# Patient Record
Sex: Male | Born: 1935
Health system: Southern US, Community
[De-identification: ages and names within clinical notes are randomized; demographics above are authoritative.]

## PROBLEM LIST (undated history)

## (undated) DIAGNOSIS — I2510 Atherosclerotic heart disease of native coronary artery without angina pectoris: Secondary | ICD-10-CM

## (undated) DIAGNOSIS — Z973 Presence of spectacles and contact lenses: Secondary | ICD-10-CM

## (undated) DIAGNOSIS — Z9289 Personal history of other medical treatment: Secondary | ICD-10-CM

## (undated) DIAGNOSIS — N401 Enlarged prostate with lower urinary tract symptoms: Secondary | ICD-10-CM

## (undated) DIAGNOSIS — I5042 Chronic combined systolic (congestive) and diastolic (congestive) heart failure: Secondary | ICD-10-CM

## (undated) DIAGNOSIS — Z974 Presence of external hearing-aid: Secondary | ICD-10-CM

## (undated) DIAGNOSIS — R4189 Other symptoms and signs involving cognitive functions and awareness: Secondary | ICD-10-CM

## (undated) DIAGNOSIS — R011 Cardiac murmur, unspecified: Secondary | ICD-10-CM

## (undated) DIAGNOSIS — Z955 Presence of coronary angioplasty implant and graft: Secondary | ICD-10-CM

## (undated) DIAGNOSIS — I440 Atrioventricular block, first degree: Secondary | ICD-10-CM

## (undated) DIAGNOSIS — R600 Localized edema: Secondary | ICD-10-CM

## (undated) DIAGNOSIS — E119 Type 2 diabetes mellitus without complications: Secondary | ICD-10-CM

## (undated) DIAGNOSIS — I10 Essential (primary) hypertension: Secondary | ICD-10-CM

## (undated) DIAGNOSIS — I209 Angina pectoris, unspecified: Secondary | ICD-10-CM

## (undated) DIAGNOSIS — R29898 Other symptoms and signs involving the musculoskeletal system: Secondary | ICD-10-CM

## (undated) DIAGNOSIS — I482 Chronic atrial fibrillation, unspecified: Secondary | ICD-10-CM

## (undated) DIAGNOSIS — G8929 Other chronic pain: Secondary | ICD-10-CM

## (undated) DIAGNOSIS — K219 Gastro-esophageal reflux disease without esophagitis: Secondary | ICD-10-CM

## (undated) DIAGNOSIS — R0609 Other forms of dyspnea: Secondary | ICD-10-CM

## (undated) DIAGNOSIS — Z8619 Personal history of other infectious and parasitic diseases: Secondary | ICD-10-CM

## (undated) DIAGNOSIS — M1990 Unspecified osteoarthritis, unspecified site: Secondary | ICD-10-CM

## (undated) DIAGNOSIS — I700 Atherosclerosis of aorta: Secondary | ICD-10-CM

## (undated) DIAGNOSIS — I447 Left bundle-branch block, unspecified: Secondary | ICD-10-CM

## (undated) DIAGNOSIS — D563 Thalassemia minor: Secondary | ICD-10-CM

## (undated) DIAGNOSIS — I259 Chronic ischemic heart disease, unspecified: Secondary | ICD-10-CM

## (undated) DIAGNOSIS — H9190 Unspecified hearing loss, unspecified ear: Secondary | ICD-10-CM

## (undated) DIAGNOSIS — Z951 Presence of aortocoronary bypass graft: Secondary | ICD-10-CM

## (undated) DIAGNOSIS — IMO0001 Reserved for inherently not codable concepts without codable children: Secondary | ICD-10-CM

## (undated) DIAGNOSIS — J449 Chronic obstructive pulmonary disease, unspecified: Secondary | ICD-10-CM

## (undated) HISTORY — PX: FOOT SURGERY: SHX648

## (undated) HISTORY — PX: CATARACT EXTRACTION W/ INTRAOCULAR LENS  IMPLANT, BILATERAL: SHX1307

## (undated) HISTORY — PX: CARDIAC CATHETERIZATION: SHX172

## (undated) HISTORY — DX: Chronic ischemic heart disease, unspecified: I25.9

## (undated) HISTORY — DX: Personal history of other medical treatment: Z92.89

## (undated) HISTORY — PX: CORONARY ARTERY BYPASS GRAFT: SHX141

## (undated) HISTORY — PX: APPENDECTOMY: SHX54

---

## 1976-10-26 HISTORY — PX: INGUINAL HERNIA REPAIR: SUR1180

## 1977-10-26 HISTORY — PX: HERNIA REPAIR: SHX51

## 1990-10-26 HISTORY — PX: CARDIAC CATHETERIZATION: SHX172

## 1999-06-05 ENCOUNTER — Emergency Department (HOSPITAL_COMMUNITY): Admission: EM | Admit: 1999-06-05 | Discharge: 1999-06-05 | Payer: Self-pay | Admitting: Emergency Medicine

## 2003-10-27 DIAGNOSIS — I259 Chronic ischemic heart disease, unspecified: Secondary | ICD-10-CM

## 2003-10-27 HISTORY — DX: Chronic ischemic heart disease, unspecified: I25.9

## 2003-12-25 HISTORY — PX: CORONARY ARTERY BYPASS GRAFT: SHX141

## 2004-01-03 ENCOUNTER — Inpatient Hospital Stay (HOSPITAL_COMMUNITY): Admission: EM | Admit: 2004-01-03 | Discharge: 2004-01-15 | Payer: Self-pay | Admitting: Emergency Medicine

## 2004-02-08 ENCOUNTER — Encounter
Admission: RE | Admit: 2004-02-08 | Discharge: 2004-02-08 | Payer: Self-pay | Admitting: Thoracic Surgery (Cardiothoracic Vascular Surgery)

## 2004-03-10 ENCOUNTER — Encounter (HOSPITAL_COMMUNITY): Admission: RE | Admit: 2004-03-10 | Discharge: 2004-06-08 | Payer: Self-pay | Admitting: Internal Medicine

## 2004-06-09 ENCOUNTER — Encounter (HOSPITAL_COMMUNITY): Admission: RE | Admit: 2004-06-09 | Discharge: 2004-09-07 | Payer: Self-pay | Admitting: Cardiology

## 2008-04-27 ENCOUNTER — Emergency Department (HOSPITAL_COMMUNITY): Admission: EM | Admit: 2008-04-27 | Discharge: 2008-04-27 | Payer: Self-pay | Admitting: Emergency Medicine

## 2008-04-30 ENCOUNTER — Encounter (HOSPITAL_COMMUNITY): Admission: RE | Admit: 2008-04-30 | Discharge: 2008-07-18 | Payer: Self-pay | Admitting: Emergency Medicine

## 2009-04-13 ENCOUNTER — Emergency Department (HOSPITAL_COMMUNITY): Admission: EM | Admit: 2009-04-13 | Discharge: 2009-04-13 | Payer: Self-pay | Admitting: Family Medicine

## 2009-11-05 ENCOUNTER — Emergency Department (HOSPITAL_COMMUNITY): Admission: EM | Admit: 2009-11-05 | Discharge: 2009-11-05 | Payer: Self-pay | Admitting: Emergency Medicine

## 2010-07-07 ENCOUNTER — Ambulatory Visit: Payer: Self-pay | Admitting: Cardiology

## 2010-07-15 ENCOUNTER — Ambulatory Visit (HOSPITAL_COMMUNITY): Admission: RE | Admit: 2010-07-15 | Discharge: 2010-07-15 | Payer: Self-pay | Admitting: Cardiology

## 2010-07-15 ENCOUNTER — Ambulatory Visit: Payer: Self-pay | Admitting: Cardiology

## 2010-07-15 HISTORY — PX: DOPPLER ECHOCARDIOGRAPHY: SHX263

## 2010-11-28 ENCOUNTER — Ambulatory Visit
Admission: RE | Admit: 2010-11-28 | Discharge: 2010-11-28 | Disposition: A | Discharge status: Home / Self Care | Payer: Medicare Other | Source: Ambulatory Visit | Attending: Cardiology | Admitting: Cardiology

## 2010-11-28 ENCOUNTER — Other Ambulatory Visit: Payer: Self-pay | Admitting: Cardiology

## 2010-11-28 ENCOUNTER — Ambulatory Visit (INDEPENDENT_AMBULATORY_CARE_PROVIDER_SITE_OTHER): Payer: Medicare Other | Admitting: Cardiology

## 2010-11-28 DIAGNOSIS — R0602 Shortness of breath: Secondary | ICD-10-CM

## 2010-11-28 DIAGNOSIS — I4460 Unspecified fascicular block: Secondary | ICD-10-CM

## 2011-03-06 ENCOUNTER — Telehealth: Payer: Self-pay | Admitting: Cardiology

## 2011-03-06 NOTE — Telephone Encounter (Signed)
Pt called  He wants Dr Patty Sermons to write note so [place] can go [place] please call

## 2011-03-06 NOTE — Telephone Encounter (Signed)
Wanted Dakota [name] to call him. Alternate cell phone number: [phone]

## 2011-03-06 NOTE — Telephone Encounter (Signed)
Wants a letter from you stating he is under your care and ok to travel outside of the country, but should not travel alone.  Would like it to be noted that his daughter Jamorion Gomillion should accompany him.  Will call him when completed.

## 2011-03-09 ENCOUNTER — Encounter: Payer: Self-pay | Admitting: *Deleted

## 2011-03-09 ENCOUNTER — Encounter: Payer: Self-pay | Admitting: Cardiology

## 2011-03-09 ENCOUNTER — Telehealth: Payer: Self-pay | Admitting: Cardiology

## 2011-03-09 NOTE — Telephone Encounter (Signed)
Don't change the note. He wanted it as written. Please call back.

## 2011-03-09 NOTE — Telephone Encounter (Signed)
To Whom It May Concern:  [name] is under my care for cardiac issues.   Based on his present level of health it would be okay for him to travel outside of the country but he should not travel alone.  His daughter Dakota [name] should accompany him.  Cong Hightower A. Tylie Golonka M.D.

## 2011-03-13 NOTE — Discharge Summary (Signed)
Dakota [name], Dakota [name] NO.:  [id number]   MEDICAL RECORD NO.:  [id number]                   PATIENT TYPE:  INP   LOCATION:  2015                                 FACILITY:  [facility]   PHYSICIAN:  Salvatore Decent. Dorris Fetch, M.D.         DATE OF BIRTH:  23-Mar-1926   DATE OF ADMISSION:  01/03/2004  DATE OF DISCHARGE:  01/15/2004                                 DISCHARGE SUMMARY   ADMISSION DIAGNOSIS:  Unstable angina.   ADDITIONAL/DISCHARGE DIAGNOSES:  1. Severe three-vessel coronary artery disease with unstable angina status     post coronary artery bypass grafting x4.  2. Diabetes mellitus type 2.  3. History of thalassemia minor.  4. Status post appendectomy.  5. Status post bilateral hernia repair in 1978.  6. Status post lens implantation bilaterally.   HOSPITAL MANAGEMENT/PROCEDURES:  1. Cardiac catheterization completed on January 04, 2004 by Dr. Deloris Ping.     Nahser.  2. Preoperative cardiac surgery screening including bilateral carotid     duplexes, palmar arch or Allen's test, and ABIs completed on January 07, 2004.  3. CT scan of the left hip without contrast completed on January 07, 2004.     This showed no evidence of fracture.  4. Coronary artery bypass grafting x4, completed on January 07, 2004 by Dr.     Dorris Fetch.  5. Blood transfusion of 1 unit of packed red blood cells January 11, 2004.   CONSULTATIONS:  1. Cardiac rehabilitation.  2. Nutrition.  3. Diabetes management.  4. Case management.   HISTORY OF PRESENT ILLNESS:  Dakota [name] is a 77 year old [place]-born  gentleman who presented to [facility] emergency department with  substernal chest pain. The patient had the onset of chest pain the evening  prior to presentation and again in the morning of presentation. The patient  was at work when he experienced this substernal chest pain and took  sublingual nitroglycerin which caused him a syncopal episode. The patient  stated  that he had been weak for the past week or so because of a flu-like  illness for which he was seen at an urgent care for treatment. The patient  was associated with diaphoresis and questionable nausea but no vomiting.  Pain occurred at rest and was described as a heavy weight sitting on his  chest. The patient was evaluated for chest pain in 1989 while living in  [place] and had cardiac catheterization there which apparently was  normal. A Cardiolite stress test was completed in January of 2001 which was  normal, and another Cardiolite stress test was completed in July of 2003  which was also normal. It was decided by Dr. Patty Sermons to admit the patient  to [facility] hospital for further evaluation of chest pain and to rule out  myocardial infarction.   HOSPITAL COURSE:  Dakota [name] was then admitted to [facility]  hospital for rule  out myocardial infarction on January 03, 2004. The patient was started on IV  heparin and treated appropriately for his chest pain. The patient remained  free of chest pain throughout the evening, and his cardiac enzymes were  negative for myocardial infarction. The plan was for the patient to undergo  cardiac catheterization on January 04, 2004 by Dr. Elease Hashimoto.   The patient was taken to the cardiac catheterization lab on January 04, 2004  and underwent left heart catheterization with coronary angiography. This  showed severe three-vessel coronary artery disease with left main  involvement. There was normal left ventricular systolic function with an  estimated ejection fraction of 65%. The patient was found to have diffuse  disease in the left anterior descending artery. There was a 70 to 80%  stenosis at the mid point of the first diagonal vessel. There was an ostial  90% stenosis right at the left circumflex origin and a 95 to 99% stenosis of  the branch point of two large obtuse marginal branches. The right coronary  artery was large and dominant, and there was  a 60 to 70% stenosis in the  distal right coronary artery, just prior to the bifurcation, into the PDA  and the PL segment. Dr. Patty Sermons felt it was appropriate that the patient  should be referred to CVTS for evaluation for coronary artery bypass  grafting. Dr. Dorris Fetch of CVTS responded to the consultation  appropriately.   Dr. Dorris Fetch saw and examined the patient in consultation January 04, 2004.  His impression was that this indeed was a 77 year old male with unstable  angina and three-vessel coronary artery disease with good left ventricular  function. He felt that coronary artery bypass grafting surgery was indicated  for survival benefit and relief of symptoms. The risks, benefits, and  alternatives were discussed with the patient and his family at that time.  They were in understanding of these risks and agreed to proceed with  surgery. The plan was for the patient to be taken to the operating room then  on first available opening which was January 08, 2004.   Throughout the next several hospital days, the patient had a few episodes of  recurrent chest pain. These were treated appropriately with IV  nitroglycerin, Integrilin, and heparin. The patient's EKG remained stable.  In addition to chest pain, the patient also complained of left hip pain of  which was causing him difficulties walking. Tylenol and mobility were  encouraged although the left hip pain persisted. It was decided that the  patient should undergo a left hip CT scan for evaluation of any further  process. The patient underwent a left CT scan of the hip on January 07, 2004  which showed no evidence of fracture. There was  sclerotic foci in the left  femoral head and neck region which could represent a bony island. The  patient's pain continued to be treated with Tylenol.   Dakota [name] also underwent preliminary arterial evaluation for cardiac surgery  which included bilateral carotid artery Duplex studies. This  showed no evidence of internal carotid artery stenosis bilaterally. Palmar arch  Doppler wave form in the right and left upper extremity were completed which  showed a diminished wave form with radial compression on the right and a  normal left palmar arch test. Bilateral ABIs were completed which showed  greater than 1.0 on the right and left. Continued as planned with surgery  done on January 08, 2004.  The patient was taken to the operating room on January 08, 2004 and underwent  coronary artery bypass grafting x4 utilizing the left internal mammary  artery to the left anterior descending artery, saphenous vein graft to the  first diagonal branch, saphenous vein graft to the obtuse marginal branch,  saphenous vein graft to the posterior descending artery. The greater  saphenous vein graft was harvested endoscopically from the right leg. The  patient had fair quality targets in the posterior descending, OM, and first  diagonal. The LAD was a good quality target. The saphenous vein was good  quality, and the patient had a good internal mammary artery. The patient  tolerated this procedure well and was transferred to the surgical intensive  care unit, intubated and sedated in critical but stable condition. The  patient remained hemodynamically stable throughout the evening of surgery.  His chest tube output was minimal, and he had good urine output. He was  extubated without difficulty later than evening.   On postoperative day #1, the patient remained afebrile and hemodynamically  stable. He was saturating at 98% on 1 liter nasal cannula. The patient's  invasive lines were discontinued in a stepwise fashion. He had minimal chest  tube output and no evidence of air leak. Therefore, his chest tubes were  also discontinued in routine fashion. He was found to have somewhat low  hemoglobin and hematocrit at 8.2 and 25.0 respectively. Decision was made to  monitor this for any further changes.  The patient was deemed appropriate for  transverse to the stepdown unit on that day.   The patient continued to do well while on the stepdown unit. He was  initiated with cardiac rehab and was ambulating in the hallways without  difficulty. He did complain of weakness and easy fatigability. His blood  clots were followed and slightly decreased once again to 7.3 and 23.3 for  hemoglobin and hematocrit. It was decided that the patient should be  transfused 1 unit of packed red blood cells for symptomatic relief. He  tolerated this well without any reaction. Otherwise, the patient continued  to increase his activity as tolerated. He was started on his preoperative  medications. He remained in a normal sinus rhythm on telemetry. He was  weaned from nasal cannula oxygen without any difficulties. He was tolerating  a regular diet. He had normal bowel and bladder function. His incisions were  healing well without evidence of infection. The sternum was stable. He continued to improve in energy and activity level over the next several  hospital days.   He was deemed appropriate for initiation of discharge planning then on  postoperative day #6 or January 14, 2004. The patient's physical exam at the  time of discharge planning was as follows:  He was afebrile with a blood  pressure of 120/70, pulse of 68 and regular, respirations of 20 and  unlabored, SpO2 of 94% on room air. His weight was 136 pounds which is at  preoperative weight. His bedside blood glucose measurements for the last 24  hours were as follows:  140, 170, 269, and 107. His heart was an irregular  rate and rhythm, reading normal sinus rhythm on telemetry. Lungs revealed  slightly diminished sounds on the left base, otherwise were clear. His  abdomen was soft, nontender, nondistended with good sounds. He had no  peripheral edema, and his extremities were warm and dry. His sternum was  stable, and his incision was clean, dry, and  intact. His right  lower  extremity had evidence of ecchymosis which was resolving, and his incisions  were clean, dry, and intact. The patient's pacer wires were discontinued  without difficulty that day.   We will continue as planned with discharge on January 15, 2004 pending a.m.  rounds and no change in the patient's clinical status. We will draw one more  CBC to evaluate for any change in the patient's hemoglobin and hematocrit.   DISCHARGE MEDICATIONS:  1. Toprol-XL 50 mg daily.  2. Zocor 20 mg daily.  3. Aspirin 325 mg daily.  4. Amaryl 2 mg daily.  5. Ultram 50 mg one to two tablets every four to six hours as needed for     pain.   ALLERGIES:  SULFA causes a rash.   DISCHARGE INSTRUCTIONS:  1. Activity:  No driving. No heavy lifting or strenuous activity. The     patient should continue to walk daily. He should continue his breathing     exercises.  2. Diet:  The patient should follow a constant carbohydrate, low-     cholesterol, heart healthy diet.  3. Wound care:  The patient may shower. He should wash his incisions daily     with soap and water. He should notify the CVTS office if he has any     increased redness, drainage, or swelling from his incisions.   FOLLOWUP APPOINTMENT:  1. The patient is to see Dr. Dorris Fetch within three weeks of discharge.     The CVTS office will call the patient with the appointment date and time.     He will also obtain a chest x-ray on that day on [facility] and hand carry the films to Dr. Sunday Corn office.  2. The patient is to follow up with Dr. Patty Sermons of [facility]     within two weeks of discharge. He is to call [phone] for this     appointment.      Carolyn A. Eustaquio Boyden.                  Salvatore Decent Dorris Fetch, M.D.    CAF/MEDQ  D:  01/14/2004  T:  01/16/2004  Job:  [id number]   cc:   Lenn Cal, M.D.  [employer]

## 2011-03-13 NOTE — Op Note (Signed)
Dakota [name], Dakota NO.:  [id number]   MEDICAL RECORD NO.:  [id number]                   PATIENT TYPE:  INP   LOCATION:  2301                                 FACILITY:  [facility]   PHYSICIAN:  Salvatore Decent. Dorris Fetch, M.D.         DATE OF BIRTH:  May 13, 1926   DATE OF PROCEDURE:  01/08/2004  DATE OF DISCHARGE:                                 OPERATIVE REPORT   PREOPERATIVE DIAGNOSIS:  Three-vessel coronary disease with unstable angina.   POSTOPERATIVE DIAGNOSIS:  Three-vessel coronary disease with unstable  angina.   PROCEDURES:  Median sternotomy, extracorporeal circulation, coronary artery  bypass grafting x4 (left internal mammary artery to left anterior descending  artery, saphenous vein graft to first diagonal, saphenous vein graft to  obtuse marginal, saphenous vein graft to posterior descending), endoscopic  vein harvest, right leg.   SURGEON:  Salvatore Decent. Dorris Fetch, M.D.   ASSESSMENT:  Carmin Muskrat. Eustaquio Boyden.   ANESTHESIA:  General.   FINDINGS:  Diffusely diseased, fair-quality targets in the posterior  descending, OM, and first diagonal.  LAD good-quality target.  Good-quality  saphenous vein.  Good mammary.   CLINICAL NOTE:  Dakota [name] is a 77 year old [name] gentleman who presented  with unstable angina.  He had two episodes of severe substernal chest  tightness.  He was admitted and ruled four myocardial infarction but at  catheterization had severe three-vessel coronary disease.  He was referred  for coronary artery bypass grafting.  The indications, risks, benefits, and  alternatives were discussed in detail with the patient.  He understood and  accepted the risks and agreed to proceed.   OPERATIVE NOTE:  Dakota [name] was brought to the preop holding area on January 08, 2004.  There lines were placed to monitor arterial, central venous, and  pulmonary arterial pressure.  Intravenous antibiotics were administered.  The patient was taken  to the operating room, anesthetized, and intubated.  A  Foley catheter was placed.  The chest, abdomen, and legs were prepped and  draped in the usual fashion.   An incision was made in the medial aspect of the right leg.  The greater  saphenous vein was identified at the knee.  It was then harvested  endoscopically from the right thigh as well as from the upper portion of the  right leg.  It became very small in the left after it bifurcated, but there  was adequate conduit for the grafts of the portion of the vein that was of  adequate size.  A median sternotomy was performed and the left internal  mammary artery was harvested in standard fashion.  It was a 2 mm good-  quality vessel.  The patient was fully heparinized prior to dividing the  distal end of the mammary artery.   The pericardium was opened and the ascending aorta was inspected and  palpated.  There was no palpable atherosclerotic disease.  The aorta was  cannulated via concentric 2-0 Ethibond pledgeted pursestring sutures.  A  dual-stage venous cannula was placed via pursestring suture in the right  atrial appendage.  Cardiopulmonary bypass was instituted and the patient was  cooled to 32 degrees Celsius.  The coronary arteries were inspected and  anastomotic sites were chosen.  The conduits were inspected and cut to  length.  A foam pad was placed in the pericardium to protect the left  phrenic nerve, a temperature probe was placed in the myocardial septum, and  a cardioplegia cannula was placed in the ascending aorta.   The aorta was crossclamped.  The left ventricle was emptied via the aortic  root vent.  Cardiac arrest then was achieved with a combination of cold  antegrade blood cardioplegia and topical iced saline.  One liter of  cardioplegia was administered.  The myocardial septal temperature was 11  degrees Celsius.  The following distal anastomoses then were performed.   First a reversed saphenous vein graft  was placed end-to-side to the  posterior descending.  An arteriotomy was made in the posterior descending,  then extended retrograde onto the distal right coronary.  There was some  plaque in the distal right coronary, but a probe passed easily into the  continuation of the coronary beyond the bifurcation as well as a 1.5 mm  probe passing into the posterior descending itself.  The vein graft then was  anastomosed end-to-side with a running 7-0 Prolene suture.  The target was  only of fair quality.  The vein was of good quality.  The anastomosis was  probed proximally and distally, as all anastomoses were prior to tying the  suture.  There was excellent flow through the anasetomosis.  Cardioplegia  was administered.  There was a small leak at the heel, which was repaired  with a 7-0 Prolene suture.   Next a reversed saphenous vein graft was placed end-to-side to the large  obtuse marginal branch of the left circumflex coronary artery.  The  circumflex had a 50% irregular stenosis.  The OM was a large vessel, which  was of fair quality.  There was diffuse disease in it, but a 1.5 mm probe  did pass easily proximally and distally.  A saphenous vein was anastomosed  end-to-side with a running 7-0 Prolene suture.  Again the vein was of good  quality.  At the completion of the anastomosis the probe passed easily.  There was excellent flow.  Cardioplegia was administered.  There was good  hemostasis.   Next a reversed saphenous vein graft was placed to the first diagonal branch  of the LAD.  The first diagonal arose from the LAD.  There was a severely  diseased portion with heavy calcification.  It then bifurcated.  The branch  closer to the LAD was the larger of the two and was the only one large  enough to graft.  It was of fair quality, but a 1.5 mm probe did fit into the vessel.  The saphenous vein was anastomosed end-to-side with a running 7-  0 Prolene suture.  Again there was good flow  through this vein.  Cardioplegia was administered.  There was good hemostasis.  Of note, this  vein was smaller proximally than the other two vein segments.   Next the left internal mammary artery was brought through a window in the  pericardium and the distal end was spatulated.  It was a 2 mm good-quality  conduit.  It  was anastomosed end-to-side to the LAD, which was a 2 mm good-  quality target.  The anastomosis was performed with a running 8-0 Prolene  suture in an end-to-side fashion.  At the completion of the mammary to LAD  anastomosis, the bulldog clamp was briefly removed.  There was a good flash  of blood into the distal vessel.  There was immediate and rapid septal  rewarming.  The bulldog clamp was replaced.  The mammary pedicle was tacked  to the epicardial surface of the heart with 6-0 Prolene sutures.   Additional cardioplegia then was administered down the vein grafts and  aortic root.  The vein grafts were cut to length, the cardioplegia cannula  was removed from the ascending aorta, and the proximal vein graft  anastomoses then were performed to 4.0 mm punch aortotomies with running 6-0  Prolene sutures.  At the completion of the final proximal anastomosis,  lidocaine was administered, the bulldog clamp was again removed range of  motion the left mammary artery, and immediate and rapid septal rewarming was  once again noted.  The patient was placed in steep Trendelenburg position  and the lungs were inflated, the heart was filled with blood, and the aortic  root was de-aired.  After de-airing the aortic root, the aortic crossclamp  was removed with a total crossclamp time of 75 minutes.  The patient  required a single defibrillation with 20 joules.  He then was profoundly  bradycardic.  The patient was rewarmed during the proximal anastomoses.   All proximal and distal anastomoses were inspected for hemostasis,  epicardial pacing wires were placed on the right  ventricle and right atrium.  DDD pacing was initiated.  The patient did pace atrially but had a very long  PR interval; therefore, it was elected to dual-chamber pace.  After the  patient had been rewarmed to a core temperature of 37 degrees Celsius, he  was weaned from cardiopulmonary bypass without difficulty.  He was paced but  on no inotropic support at the time of separation from bypass.  The total  bypass time was 139 minutes.  The cardiac index was low initially but  responded appropriately to volume administration.   A test dose of protamine was administered and was well-tolerated.  The  atrial and aortic cannulae were removed.  The remainder of the protamine was  administered without incident.  The chest was irrigated with 1 L of warm  normal saline containing 1 g of vancomycin.  Hemostasis was achieved.  The pericardium was reapproximated with interrupted 3-0 silk sutures.  It came  together easily without tension.  A left pleural and two mediastinal chest  tubes were placed through separate subcostal incisions.  The sternum was  closed with heavy-gauge interrupted stainless steel wires.  The remainder of  the incisions were closed in standard fashion.  Subcuticular closures were  used for the skin.  All sponge, needle, and instrument counts were correct  at the end of the procedure.  The patient was taken from the operating room  to the surgical intensive care unit in critical but stable condition.                                               Salvatore Decent Dorris Fetch, M.D.    SCH/MEDQ  D:  01/08/2004  T:  01/10/2004  Job:  [id number]   cc:   Cassell Clement, M.D.  [address]  [place]  [place] [address]  Fax: [phone]   [name], M.D.  [address]  [place]  [place] [address]  Fax: [phone]

## 2011-03-13 NOTE — H&P (Signed)
Dakota [name], Dakota [name] NO.:  [id number]   MEDICAL RECORD NO.:  [id number]                   PATIENT TYPE:  EMS   LOCATION:  MAJO                                 FACILITY:  [facility]   PHYSICIAN:  Cassell Clement, M.D.              DATE OF BIRTH:  05/05/1926   DATE OF ADMISSION:  01/03/2004  DATE OF DISCHARGE:                                HISTORY & PHYSICAL   CHIEF COMPLAINT:  Chest pain.   HISTORY:  This is a 77 year old Pakistani-born gentleman admitted with  substernal chest pain.  He had the onset of pain last evening and again this  morning.  This morning he was at work and took his nitroglycerin while  standing up and promptly had syncope and woke up lying on the floor.  He was  working at [employer] at the time.  He states he has been weak for the past  week because of a flu-like illness for which he was seen at one of the  urgent cares and was placed on Tamiflu.  The pain this morning was  associated with diaphoresis and questionable nausea but no vomiting.  There  was no radiation into the arms.  The pain occurred at rest and was described  as a heavy weight sitting on his chest.  The patient has a past suspected  cardiac history.  He was evaluated for chest pain in 1989 while living in  [place] and had cardiac catheterization there, which was normal.  We  did a Cardiolite stress test in January 2001 which was normal and a  Cardiolite stress test in July 2003, which was normal.  Ejection fraction at  that time was 67%.   The patient is on Amaryl 1 mg a day, Ecotrin 81 mg a day, and has Prevacid  on hand for p.r.n. use.   FAMILY HISTORY:  His father died at age 75 of old age.  Brother died of a  stroke at age 25.   SOCIAL HISTORY:  He is married and has two children.  He is a nonsmoker.  He  works at [employer] in [employer], and he works in Airline pilot.   REVIEW OF SYSTEMS:  He denies any history of GI bleed or peptic ulcer.   He  has had occasional constipation.  He denies any dysuria.  He has had a  remote history of a kidney stone.  He is diabetic, on Amaryl and careful  diet.  He states his cholesterol in the past has been normal.  Remainder of  the review of systems is negative in detail.   PHYSICAL EXAMINATION:  VITAL SIGNS:  Blood pressure is 124/60, pulse 70 and  regular, respirations are normal.  HEENT:  Appears slightly pale.  Pupils are equal and reactive.  NECK:  Jugular venous pressure normal.  Carotids normal.  Thyroid normal.  CHEST:  Scattered rhonchi consistent  with his history of recent flu-like  illness.  CARDIAC:  Quiet precordium with a questionable S4 gallop, no S3, no murmur,  click, or rub.  ABDOMEN:  No hepatosplenomegaly or mass.  EXTREMITIES:  Good peripheral pulses, no phlebitis or edema.   His electrocardiogram shows normal sinus rhythm with slight ST segment  elevation in leads II, III, aVF, and V2 through V6.  There is first degree  AV block with a PR of 0.22.  His initial two sets of cardiac enzymes are  negative.   IMPRESSION:  1. Chest pain, rule out myocardial infarction.  2. Diabetes mellitus.  3. Recent flu-like illness with chest congestion.  4. History of thalassemia minor.   DISPOSITION:  We are going to treat with IV heparin.  Will add beta blocker.  Will give him IV fluids and check lipids.  Anticipate cardiac  catheterization Friday morning by Dr. Elease Hashimoto or colleagues to evaluate  question of coronary disease further.                                                Cassell Clement, M.D.    TB/MEDQ  D:  01/03/2004  T:  01/03/2004  Job:  [id number]   cc:   [name], M.D.  [address]  [place]  [place] [address]  Fax: [phone]   Vesta Mixer, M.D.  [address], [address]  [place]  [place] [address]  Fax: [phone]

## 2011-03-13 NOTE — Cardiovascular Report (Signed)
NAMEJARAN, SAINZ NO.:  [id number]   MEDICAL RECORD NO.:  [id number]                   PATIENT TYPE:  INP   LOCATION:  3731                                 FACILITY:  [facility]   PHYSICIAN:  Vesta Mixer, M.D.              DATE OF BIRTH:  Nov 18, 1925   DATE OF PROCEDURE:  DATE OF DISCHARGE:                              CARDIAC CATHETERIZATION   Dakota [name] is a 77 year old gentleman with a history of chest pain in the  past.  He has had a negative Cardiolite study several years ago.  He had a  negative heart catheterization in [place] approximately 15 years ago.   The patient has had some intermittent episodes of chest pain and then  presented yesterday with symptoms consistent with unstable angina.   PROCEDURE:  Left heart catheterization with coronary angiography.   HEMODYNAMICS:  The left ventricular pressure was 136/7 with an aortic  pressure of 138/61.   CORONARY ANGIOGRAPHY:  1. Left main coronary artery has a distal 50-60% stenosis.  2. The left anterior descending artery is very heavily calcified in the     proximal and mid segments.  The proximal segment is diffusely diseased     between 60 and 70% and there is a 70-75% stenosis in the mid vessel.  The     remainder of the LAD has some minor luminal irregularities and a distal     stenosis of approximately 40%.  The first diagonal vessel is a large     vessel.  There is a 70-80% stenosis at its mid point.  3. The left circumflex artery is a large vessel.  There is an ostial 90%     stenosis right at the left circumflex origin.  The first obtuse marginal     artery is a very large and branching vessel.  There is a 95-99% stenosis     right at the branch point of these  two large obtuse marginal branches.  4. The right coronary artery is large and dominant.  There is mild to     moderate disease in the proximal and mid segments.  There is a 60-70%     stenosis in the distal RCA just  prior to the bifurcation into the     posterior descending artery in the posterior lateral segment artery.  The     posterior descending artery and the posterior lateral segment artery have     mild-to-moderate irregularities.   LEFT VENTRICULOGRAM:  Left ventriculogram reveals normal left ventricular  systolic function.  The ejection fraction is 65%.   COMPLICATIONS:  None.   CONCLUSIONS:  1. Severe three-vessel coronary artery disease with left main involvement.  2. Normal left ventricular systolic function.   We will refer the patient to [facility] for evaluation for coronary artery bypass  grafting.  Vesta Mixer, M.D.    PJN/MEDQ  D:  01/04/2004  T:  01/05/2004  Job:  [id number]   cc:   [facility]

## 2011-03-24 ENCOUNTER — Ambulatory Visit: Payer: Medicare Other | Admitting: Cardiology

## 2011-04-02 ENCOUNTER — Encounter: Payer: Self-pay | Admitting: Cardiology

## 2011-04-03 ENCOUNTER — Encounter: Payer: Self-pay | Admitting: Cardiology

## 2011-04-03 ENCOUNTER — Ambulatory Visit (INDEPENDENT_AMBULATORY_CARE_PROVIDER_SITE_OTHER): Payer: BC Managed Care – PPO | Admitting: Cardiology

## 2011-04-03 VITALS — BP 100/66 | HR 56 | Ht 67.0 in | Wt 155.4 lb

## 2011-04-03 DIAGNOSIS — I5042 Chronic combined systolic (congestive) and diastolic (congestive) heart failure: Secondary | ICD-10-CM

## 2011-04-03 DIAGNOSIS — Z951 Presence of aortocoronary bypass graft: Secondary | ICD-10-CM | POA: Insufficient documentation

## 2011-04-03 DIAGNOSIS — Z9889 Other specified postprocedural states: Secondary | ICD-10-CM

## 2011-04-03 DIAGNOSIS — I5022 Chronic systolic (congestive) heart failure: Secondary | ICD-10-CM | POA: Insufficient documentation

## 2011-04-03 DIAGNOSIS — E7800 Pure hypercholesterolemia, unspecified: Secondary | ICD-10-CM | POA: Insufficient documentation

## 2011-04-03 DIAGNOSIS — I447 Left bundle-branch block, unspecified: Secondary | ICD-10-CM | POA: Insufficient documentation

## 2011-04-03 DIAGNOSIS — E119 Type 2 diabetes mellitus without complications: Secondary | ICD-10-CM

## 2011-04-03 NOTE — Assessment & Plan Note (Signed)
This elderly gentleman has a history of coronary artery bypass graft surgery in March 2005.  He also has a history of mild aortic stenosis by echocardiogram in 2009.  His last nuclear stress test was 01/07/10 and showed no reversible ischemia.  He does have known left bundle-branch block and he has dyssynergy of the septum secondary to his bundle-branch block.  He has had dyspnea which has responded to Lasix.  Since we last saw him his wife died after a long illness.  Following her death his daughter took him on a trip to [place] to visit relatives and this was a help to him he tolerated the trip well.

## 2011-04-03 NOTE — Progress Notes (Signed)
Dakota [name] Date of Birth:  [birth year] [facility] [address]   [address] [place], [place]  [address] [phone]           Fax   [phone]  History of Present Illness: This pleasant 77 year old Dakota [name] born gentleman is seen for a scheduled followup office visit.  He has known ischemic heart disease.  He underwent coronary artery bypass graft surgery in March of 2005.  He has a history of mild aortic stenosis and trivial aortic insufficiency as well as mitral annular calcification and trace mitral regurgitation by echocardiogram of 08/27/08.  He had a nuclear stress test on 01/07/10 showing an ejection fraction of 57% and no reversible ischemia.  His previous echocardiogram 07/15/10 showed an ejection fraction of 40-45%.  He has felt to have chronic mixed combined systolic and diastolic congestive heart failure.  Since we last saw him his wife died of a chronic cardiopulmonary illness.  The patient seems to be making an appropriate recovery from the grief of his wife's illness.  Current Outpatient Prescriptions  Medication Sig Dispense Refill  . acetaminophen (TYLENOL) 325 MG tablet Take 650 mg by mouth every 6 (six) hours as needed.        [name] aspirin 325 MG EC tablet Take 325 mg by mouth daily.        [name] doxazosin (CARDURA) 4 MG tablet Take 4 mg by mouth at bedtime.        . furosemide (LASIX) 40 MG tablet Take 40 mg by mouth daily. 4 times a week       . glimepiride (AMARYL) 2 MG tablet Take 2 mg by mouth daily before breakfast.        . Multiple Vitamin (MULTIVITAMIN) tablet Take 1 tablet by mouth daily.        . nitroGLYCERIN (NITROSTAT) 0.4 MG SL tablet Place 0.4 mg under the tongue every 5 (five) minutes as needed.        . pravastatin (PRAVACHOL) 40 MG tablet Take 40 mg by mouth daily.        . valsartan (DIOVAN) 80 MG tablet Take 80 mg by mouth daily.        [name] DISCONTD: isosorbide mononitrate (IMDUR) 30 MG 24 hr tablet Take 30 mg by mouth daily.            Allergies  Allergen Reactions  . Aceon (Perindopril Erbumine) Cough  . Sulfa Antibiotics   . Nitroglycerin     Patches, [name], HA    Patient Active Problem List  Diagnoses  . Hx of CABG  . Left bundle branch block  . Diabetes mellitus  . Hypercholesterolemia  . Chronic combined systolic and diastolic congestive heart failure    History  Smoking status  . Never Smoker   Smokeless tobacco  . Not on file    History  Alcohol Use No    Family History  Problem Relation Age of Onset  . Ulcers Mother   . Stroke Mother     Review of Systems: Constitutional: no fever chills diaphoresis or fatigue or change in weight.  Head and neck: no hearing loss, no epistaxis, no photophobia or visual disturbance. Respiratory: No cough, shortness of breath or wheezing. Cardiovascular: No chest pain peripheral edema, palpitations. Gastrointestinal: No abdominal distention, no abdominal pain, no change in bowel habits hematochezia or melena. Genitourinary: No dysuria, no frequency, no urgency, no nocturia. Musculoskeletal:No arthralgias, no back pain, no gait disturbance or myalgias. Neurological: No  dizziness, no headaches, no numbness, no seizures, no syncope, no weakness, no tremors. Hematologic: No lymphadenopathy, no easy bruising. Psychiatric: No confusion, no hallucinations, no sleep disturbance.    Physical Exam: Filed Vitals:   04/03/11 1604  BP: 100/66  Pulse: 56  The general appearance reveals an elderly gentleman in no distress.Pupils equal and reactive.   Extraocular Movements are full.  There is no scleral icterus.  The mouth and pharynx are normal.  The neck is supple.  The carotids reveal no bruits.  The jugular venous pressure is normal.  The thyroid is not enlarged.  There is no lymphadenopathy.  The chest reveals mild expiratory wheeze.  The heart reveals a grade 2/6 systolic ejection murmur at the base.  No diastolic murmur.The abdomen is soft and nontender.  Bowel sounds are normal. The liver and spleen are not enlarged. There Are no abdominal masses. There are no bruits.  Normal extremity without phlebitis or edema.  Pedal pulses are excellent.Strength is normal and symmetrical in all extremities.  There is no lateralizing weakness.  There are no sensory deficits.The skin is warm and dry.  There is no rash.  EKG today shows normal sinus rhythm with left bundle-branch block and is unchanged from 11/28/10   Assessment / Plan: Continue present medication and use extra furosemide as necessary for dyspnea or wheezing.  Recheck in 4 months for office visit andLab work.

## 2011-04-03 NOTE — Assessment & Plan Note (Signed)
The patient has a history of chronic dyspnea.  His blood pressure tends to run low so we are not able to push diuretics to heart.  He previously was on Imdur which he stopped because it caused him to be dizzy.

## 2011-04-08 ENCOUNTER — Other Ambulatory Visit: Payer: Self-pay | Admitting: Cardiology

## 2011-04-08 ENCOUNTER — Encounter: Payer: Self-pay | Admitting: Cardiology

## 2011-04-08 NOTE — Telephone Encounter (Signed)
Faxed refill for NTG 0.4 to [facility]

## 2011-09-05 ENCOUNTER — Other Ambulatory Visit: Payer: Self-pay | Admitting: Cardiology

## 2011-09-22 ENCOUNTER — Ambulatory Visit (INDEPENDENT_AMBULATORY_CARE_PROVIDER_SITE_OTHER): Payer: BC Managed Care – PPO | Admitting: Cardiology

## 2011-09-22 ENCOUNTER — Encounter: Payer: Self-pay | Admitting: Cardiology

## 2011-09-22 VITALS — BP 132/68 | HR 70 | Ht 66.0 in | Wt 151.0 lb

## 2011-09-22 DIAGNOSIS — Z9889 Other specified postprocedural states: Secondary | ICD-10-CM

## 2011-09-22 DIAGNOSIS — Z951 Presence of aortocoronary bypass graft: Secondary | ICD-10-CM

## 2011-09-22 DIAGNOSIS — I119 Hypertensive heart disease without heart failure: Secondary | ICD-10-CM

## 2011-09-22 DIAGNOSIS — I5042 Chronic combined systolic (congestive) and diastolic (congestive) heart failure: Secondary | ICD-10-CM

## 2011-09-22 DIAGNOSIS — I447 Left bundle-branch block, unspecified: Secondary | ICD-10-CM

## 2011-09-22 NOTE — Assessment & Plan Note (Signed)
Patient is not having any symptoms referable to his left bundle branch block.  No dizziness or syncope.

## 2011-09-22 NOTE — Assessment & Plan Note (Addendum)
The patient is not having any paroxysmal nocturnal dyspnea or pedal edema at this time.  His exertional dyspnea if anything has improved in his weight is down 5 pounds since his last visit

## 2011-09-22 NOTE — Assessment & Plan Note (Signed)
The patient has not had to take any recent nitroglycerin.  No recent chest pain or angina.

## 2011-09-22 NOTE — Patient Instructions (Signed)
Your physician recommends that you continue on your current medications as directed. Please refer to the Current Medication list given to you today. Your physician recommends that you schedule a follow-up appointment in: 3 months with labs (EKG/bmet)

## 2011-09-22 NOTE — Progress Notes (Signed)
Dakota [name] Date of Birth:  06/04/1926 [facility] [address]   [address] [place], [place]  [address] [phone]           Fax   [phone]  History of Present Illness: This pleasant 77 year old gentleman is seen back after a long absence.  He has a history of chronic combined systolic and diastolic congestive heart failure per also has left bundle branch block and a past history of coronary artery bypass graft surgery.  Since we last saw him he took a three-month trip to [place] and [place] and [place] to see his relatives.  His daughter went with him.  He tolerated the trip very well and it was therapeutic as he is dealing with the grief of his wife's death.  Current Outpatient Prescriptions  Medication Sig Dispense Refill  . acetaminophen (TYLENOL) 325 MG tablet Take 650 mg by mouth every 6 (six) hours as needed.        [name] aspirin 325 MG EC tablet Take 325 mg by mouth daily.        [name] Sodium 30-100 MG CAPS Take 100 mg by mouth daily.        [name] DIOVAN 160 MG tablet TAKE ONE TABLET BY MOUTH EVERY DAY.  90 each  3  . doxazosin (CARDURA) 4 MG tablet Take 1 mg by mouth at bedtime.       . finasteride (PROSCAR) 5 MG tablet Take 5 mg by mouth daily.        . furosemide (LASIX) 40 MG tablet Take 40 mg by mouth daily. 2-3 times a week      . glimepiride (AMARYL) 2 MG tablet Take 2 mg by mouth daily before breakfast.        . glucose 4 GM chewable tablet Chew 16 g by mouth as needed.        . Multiple Vitamin (MULTIVITAMIN) tablet Take 1 tablet by mouth daily.        . nitroGLYCERIN (NITROLINGUAL) 0.4 MG/SPRAY spray Place 1 spray under the tongue every 5 (five) minutes as needed.        [name] NITROSTAT 0.4 MG SL tablet DISSOLVE ONE TABLET UNDER TONGUE AS NEEDED  100 each  11  . pravastatin (PRAVACHOL) 80 MG tablet Take 80 mg by mouth daily.          Allergies  Allergen Reactions  . Aceon (Perindopril Erbumine) Cough  . Sulfa Antibiotics     . Nitroglycerin     Patches, [name], HA    Patient Active Problem List  Diagnoses  . Hx of CABG  . Left bundle branch block  . Diabetes mellitus  . Hypercholesterolemia  . Chronic combined systolic and diastolic congestive heart failure    History  Smoking status  . Never Smoker   Smokeless tobacco  . Not on file    History  Alcohol Use No    Family History  Problem Relation Age of Onset  . Ulcers Mother   . Stroke Mother     Review of Systems: Constitutional: no fever chills diaphoresis or fatigue or change in weight.  Head and neck: no hearing loss, no epistaxis, no photophobia or visual disturbance. Respiratory: No cough, shortness of breath or wheezing. Cardiovascular: No chest pain peripheral edema, palpitations. Gastrointestinal: No abdominal distention, no abdominal pain, no change in bowel habits hematochezia or melena. Genitourinary: No dysuria, no frequency, no urgency, no nocturia. Musculoskeletal:No arthralgias, no back pain, no  gait disturbance or myalgias. Neurological: No dizziness, no headaches, no numbness, no seizures, no syncope, no weakness, no tremors. Hematologic: No lymphadenopathy, no easy bruising. Psychiatric: No confusion, no hallucinations, no sleep disturbance.    Physical Exam: Filed Vitals:   09/22/11 1526  BP: 132/68  Pulse: 70   the general appearance reveals an elderly gentleman in no acute distress.  His overall mood seems much improved since his previous visit.Pupils equal and reactive.   Extraocular Movements are full.  There is no scleral icterus.  The mouth and pharynx are normal.  The neck is supple.  The carotids reveal no bruits.  The jugular venous pressure is normal.  The thyroid is not enlarged.  There is no lymphadenopathy.  The chest is clear to percussion and auscultation. There are no rales or rhonchi. Expansion of the chest is symmetrical.  The precordium is quiet.  The first heart sound is normal.  The second  heart sound is physiologically split.  There is no murmur gallop rub or click.  There is no abnormal lift or heave.  The abdomen is soft and nontender. Bowel sounds are normal. The liver and spleen are not enlarged. There Are no abdominal masses. There are no bruits.  Extremities no phlebitis or edema.  Pedal pulses present.The skin is warm and dry.  There is no rash. Strength is normal and symmetrical in all extremities.  There is no lateralizing weakness.  There are no sensory deficits.     Assessment / Plan: The patient is doing well.  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  He is to continue same medication and be rechecked in 4 months for a followup office visit.  He had a recent complete physical with lab work at Dr. Deneen Harts office

## 2011-12-14 ENCOUNTER — Other Ambulatory Visit (INDEPENDENT_AMBULATORY_CARE_PROVIDER_SITE_OTHER): Payer: BC Managed Care – PPO | Admitting: *Deleted

## 2011-12-14 DIAGNOSIS — I119 Hypertensive heart disease without heart failure: Secondary | ICD-10-CM

## 2011-12-14 LAB — BASIC METABOLIC PANEL
CO2: 27 mEq/L (ref 19–32)
Calcium: 8.4 mg/dL (ref 8.4–10.5)
Potassium: 4 mEq/L (ref 3.5–5.1)
Sodium: 140 mEq/L (ref 135–145)

## 2011-12-14 NOTE — Progress Notes (Signed)
Quick Note:  Please make copy of labs for patient visit. ______ 

## 2011-12-16 ENCOUNTER — Encounter: Payer: Self-pay | Admitting: Cardiology

## 2011-12-16 ENCOUNTER — Ambulatory Visit (INDEPENDENT_AMBULATORY_CARE_PROVIDER_SITE_OTHER)
Admission: RE | Admit: 2011-12-16 | Discharge: 2011-12-16 | Disposition: A | Discharge status: Home / Self Care | Payer: BC Managed Care – PPO | Source: Ambulatory Visit | Attending: Cardiology | Admitting: Cardiology

## 2011-12-16 ENCOUNTER — Ambulatory Visit (INDEPENDENT_AMBULATORY_CARE_PROVIDER_SITE_OTHER): Payer: BC Managed Care – PPO | Admitting: Cardiology

## 2011-12-16 VITALS — BP 120/68 | HR 62 | Ht 67.0 in | Wt 150.0 lb

## 2011-12-16 DIAGNOSIS — I359 Nonrheumatic aortic valve disorder, unspecified: Secondary | ICD-10-CM

## 2011-12-16 DIAGNOSIS — I5042 Chronic combined systolic (congestive) and diastolic (congestive) heart failure: Secondary | ICD-10-CM

## 2011-12-16 DIAGNOSIS — E119 Type 2 diabetes mellitus without complications: Secondary | ICD-10-CM

## 2011-12-16 DIAGNOSIS — R0600 Dyspnea, unspecified: Secondary | ICD-10-CM

## 2011-12-16 DIAGNOSIS — R0609 Other forms of dyspnea: Secondary | ICD-10-CM

## 2011-12-16 DIAGNOSIS — Z951 Presence of aortocoronary bypass graft: Secondary | ICD-10-CM

## 2011-12-16 DIAGNOSIS — E7800 Pure hypercholesterolemia, unspecified: Secondary | ICD-10-CM

## 2011-12-16 NOTE — Assessment & Plan Note (Signed)
The patient sleeps on one pillow.  He has not been experiencing any paroxysmal nocturnal dyspnea.  Is not having any significant peripheral edema.

## 2011-12-16 NOTE — Patient Instructions (Signed)
Your physician has requested that you have an echocardiogram. Echocardiography is a painless test that uses sound waves to create images of your heart. It provides your doctor with information about the size and shape of your heart and how well your heart's chambers and valves are working. This procedure takes approximately one hour. There are no restrictions for this procedure.  Will have you go to the [facility] building across from [facility] for chest xray   Your physician recommends that you schedule a follow-up appointment in: 3 month ov/EKG  Your physician recommends that you continue on your current medications as directed. Please refer to the Current Medication list given to you today.

## 2011-12-16 NOTE — Assessment & Plan Note (Signed)
The patient is diabetic.  He has not been expressing any hypoglycemic episodes.

## 2011-12-16 NOTE — Assessment & Plan Note (Signed)
The patient has a past history of coronary artery bypass graft surgery in 2005.  He has not been experiencing any recent chest pain or angina.

## 2011-12-16 NOTE — Progress Notes (Signed)
Beverely Low Date of Birth:  [birth year] [facility] [address] [place], [place]  [address] [phone]         Fax   [phone]  History of Present Illness: This pleasant elderly gentleman is seen for a four-month followup office visit.  He has a history of ischemic heart disease and is status post coronary artery bypass graft surgery.  He has a history of chronic combined systolic and diastolic congestive heart failure.  He has a known left bundle branch block.  Since his last visit he has continued to complain of shortness of breath.  He is concerned that his heart situation may be getting worse.  He had an echocardiogram 2 years ago which showed an ejection fraction of 40-45%.  Current Outpatient Prescriptions  Medication Sig Dispense Refill  . acetaminophen (TYLENOL) 325 MG tablet Take 650 mg by mouth every 6 (six) hours as needed.        [name] aspirin 325 MG EC tablet Take 325 mg by mouth daily.        [name] Sodium 30-100 MG CAPS Take 100 mg by mouth daily.        [name] DIOVAN 160 MG tablet TAKE ONE TABLET BY MOUTH EVERY DAY.  90 each  3  . doxazosin (CARDURA) 4 MG tablet Take 1 mg by mouth at bedtime.       . finasteride (PROSCAR) 5 MG tablet Take 5 mg by mouth daily.        . furosemide (LASIX) 40 MG tablet Take 40 mg by mouth daily. 2-3 times a week      . glimepiride (AMARYL) 2 MG tablet Take 2 mg by mouth daily before breakfast.        . glucose 4 GM chewable tablet Chew 16 g by mouth as needed.        . Multiple Vitamin (MULTIVITAMIN) tablet Take 1 tablet by mouth daily.        . nitroGLYCERIN (NITROLINGUAL) 0.4 MG/SPRAY spray Place 1 spray under the tongue every 5 (five) minutes as needed.        [name] NITROSTAT 0.4 MG SL tablet DISSOLVE ONE TABLET UNDER TONGUE AS NEEDED  100 each  11  . pravastatin (PRAVACHOL) 80 MG tablet Take 80 mg by mouth daily.          Allergies  Allergen Reactions  . Aceon (Perindopril Erbumine) Cough  . Sulfa  Antibiotics   . Nitroglycerin     Patches, low BP, HA    Patient Active Problem List  Diagnoses  . Hx of CABG  . Left bundle branch block  . Diabetes mellitus  . Hypercholesterolemia  . Chronic combined systolic and diastolic congestive heart failure    History  Smoking status  . Never Smoker   Smokeless tobacco  . Not on file    History  Alcohol Use No    Family History  Problem Relation Age of Onset  . Ulcers Mother   . Stroke Mother     Review of Systems: Constitutional: no fever chills diaphoresis or fatigue or change in weight.  Head and neck: no hearing loss, no epistaxis, no photophobia or visual disturbance. Respiratory: No cough, shortness of breath or wheezing. Cardiovascular: No chest pain peripheral edema, palpitations. Gastrointestinal: No abdominal distention, no abdominal pain, no change in bowel habits hematochezia or melena. Genitourinary: No dysuria, no frequency, no urgency, no nocturia. Musculoskeletal:No arthralgias, no back pain, no gait disturbance or myalgias. Neurological: No  dizziness, no headaches, no numbness, no seizures, no syncope, no weakness, no tremors. Hematologic: No lymphadenopathy, no easy bruising. Psychiatric: No confusion, no hallucinations, no sleep disturbance.    Physical Exam: Filed Vitals:   12/16/11 1430  BP: 120/68  Pulse: 62   the general appearance reveals an elderly gentleman in no acute distress.Pupils equal and reactive.   Extraocular Movements are full.  There is no scleral icterus.  The mouth and pharynx are normal.  The neck is supple.  The carotids reveal no bruits.  The jugular venous pressure is normal.  The thyroid is not enlarged.  There is no lymphadenopathy.  The chest is clear to percussion and auscultation.  The heart reveals a soft systolic ejection murmur at the base and is second heart sound is accentuated The abdomen is soft and nontender. Bowel sounds are normal. The liver and spleen are not  enlarged. There Are no abdominal masses. There are no bruits.  Extremities reveal trace pretibial and ankle edema.Strength is normal and symmetrical in all extremities.  There is no lateralizing weakness.  There are no sensory deficits.  The skin is warm and dry.  There is no rash.    Assessment / Plan: Verlon Au going to get a chest x-ray.  We are also going to get a two-dimensional echocardiogram to make sure that his aortic stenosis has not worsened.  He will return in 3 months for a followup office visit and EKG

## 2011-12-18 NOTE — Progress Notes (Signed)
Left message

## 2011-12-21 ENCOUNTER — Telehealth: Payer: Self-pay | Admitting: *Deleted

## 2011-12-21 ENCOUNTER — Telehealth: Payer: Self-pay | Admitting: Cardiology

## 2011-12-21 MED ORDER — FUROSEMIDE 20 MG PO TABS: 20.0000 mg | ORAL_TABLET | Freq: Every day | ORAL | Status: DC

## 2011-12-21 MED ORDER — NITROGLYCERIN 0.4 MG/SPRAY TL SOLN: 1.0000 | Status: DC | PRN

## 2011-12-21 NOTE — Telephone Encounter (Signed)
FU Call: pt returning call from Melinda. Please return pt call to discuss further.  

## 2011-12-21 NOTE — Telephone Encounter (Signed)
Advised patient of xray results.  Also patient requested refill on Lasix 20 mg taking a few times a week.  Chart stated 40 mg of Lasix, patient said it was too strong and he had been taking 1/2 at a time

## 2011-12-21 NOTE — Telephone Encounter (Signed)
Advised of chest xray results 

## 2011-12-21 NOTE — Telephone Encounter (Signed)
Message copied by Burnell Blanks on Mon Dec 21, 2011 11:29 AM ------      Message from: Cassell Clement      Created: Wed Dec 16, 2011  9:03 PM       Pl report.  The xray is  Stable. No CHF or fluid.

## 2011-12-23 ENCOUNTER — Other Ambulatory Visit: Payer: Self-pay

## 2011-12-23 ENCOUNTER — Ambulatory Visit (HOSPITAL_COMMUNITY): Payer: BC Managed Care – PPO | Attending: Cardiology

## 2011-12-23 DIAGNOSIS — I379 Nonrheumatic pulmonary valve disorder, unspecified: Secondary | ICD-10-CM | POA: Insufficient documentation

## 2011-12-23 DIAGNOSIS — Z951 Presence of aortocoronary bypass graft: Secondary | ICD-10-CM

## 2011-12-23 DIAGNOSIS — E785 Hyperlipidemia, unspecified: Secondary | ICD-10-CM | POA: Insufficient documentation

## 2011-12-23 DIAGNOSIS — E119 Type 2 diabetes mellitus without complications: Secondary | ICD-10-CM | POA: Insufficient documentation

## 2011-12-23 DIAGNOSIS — I079 Rheumatic tricuspid valve disease, unspecified: Secondary | ICD-10-CM | POA: Insufficient documentation

## 2011-12-23 DIAGNOSIS — I359 Nonrheumatic aortic valve disorder, unspecified: Secondary | ICD-10-CM

## 2011-12-23 DIAGNOSIS — I2510 Atherosclerotic heart disease of native coronary artery without angina pectoris: Secondary | ICD-10-CM

## 2011-12-28 ENCOUNTER — Telehealth: Payer: Self-pay | Admitting: *Deleted

## 2011-12-28 ENCOUNTER — Telehealth: Payer: Self-pay | Admitting: Cardiology

## 2011-12-28 NOTE — Telephone Encounter (Signed)
Pt rtn call [phone] re lab work

## 2011-12-28 NOTE — Telephone Encounter (Signed)
Message copied by Burnell Blanks on Mon Dec 28, 2011 11:04 AM ------      Message from: Cassell Clement      Created: Wed Dec 23, 2011  5:13 PM       Please report.  The echocardiogram is satisfactory.  The ejection fraction is in the range of 50-55%.  There is diastolic dysfunction which could account for some of his dyspnea.  The valves are doing okay.  Continue same medication

## 2011-12-28 NOTE — Telephone Encounter (Signed)
Advised of echo results 

## 2012-03-05 ENCOUNTER — Other Ambulatory Visit: Payer: Self-pay | Admitting: Cardiology

## 2012-03-07 ENCOUNTER — Ambulatory Visit: Payer: BC Managed Care – PPO | Admitting: Cardiology

## 2012-03-07 NOTE — Telephone Encounter (Signed)
Refilled furosemide

## 2012-04-11 ENCOUNTER — Ambulatory Visit (INDEPENDENT_AMBULATORY_CARE_PROVIDER_SITE_OTHER): Payer: BC Managed Care – PPO | Admitting: Cardiology

## 2012-04-11 ENCOUNTER — Encounter: Payer: Self-pay | Admitting: Cardiology

## 2012-04-11 VITALS — BP 100/60 | HR 66 | Ht 67.0 in | Wt 141.0 lb

## 2012-04-11 DIAGNOSIS — R5383 Other fatigue: Secondary | ICD-10-CM

## 2012-04-11 DIAGNOSIS — Z951 Presence of aortocoronary bypass graft: Secondary | ICD-10-CM

## 2012-04-11 DIAGNOSIS — R5381 Other malaise: Secondary | ICD-10-CM | POA: Insufficient documentation

## 2012-04-11 DIAGNOSIS — I509 Heart failure, unspecified: Secondary | ICD-10-CM

## 2012-04-11 DIAGNOSIS — I447 Left bundle-branch block, unspecified: Secondary | ICD-10-CM

## 2012-04-11 DIAGNOSIS — E7800 Pure hypercholesterolemia, unspecified: Secondary | ICD-10-CM

## 2012-04-11 DIAGNOSIS — I5042 Chronic combined systolic (congestive) and diastolic (congestive) heart failure: Secondary | ICD-10-CM

## 2012-04-11 MED ORDER — LOSARTAN POTASSIUM 50 MG PO TABS: 50.0000 mg | ORAL_TABLET | Freq: Every day | ORAL | Status: DC

## 2012-04-11 NOTE — Assessment & Plan Note (Signed)
The patient has stopped his isosorbide mononitrate.  He did not think it was helping his chest tightness and it was making him feel weak and dizzy.  He has tried it several times and has concluded that the side effects were from the isosorbide mononitrate which he no longer takes

## 2012-04-11 NOTE — Progress Notes (Signed)
Dakota Dakota [name] Date of Birth:  August 09, 1926 [facility] [address] [place], [place]  [address] [phone]         Fax   [phone]  History of Present Illness: This pleasant elderly 78 year old gentleman is seen for followup office visit.  He has not been feeling too well.  He has had dizzy spells at times.  His weight is down 9 pounds since last visit.  He does not drink much in the way of fluids or water.  Blood pressure has been running lower.  He has occasional chest pain.  He has a past history of ischemic heart disease and is status post coronary artery bypass graft surgery.  He has combined chronic systolic and diastolic CHF and he has a known left bundle branch block.  His echocardiogram 2 years ago showed an ejection fraction of 40-45% and a more recent echo done on 12/23/11 showed that his ejection fraction had increased to 50-55%.  He does have diastolic dysfunction.  His coronary bypass graft surgery was in 2005. Current Outpatient Prescriptions  Medication Sig Dispense Refill  . Acetaminophen (TYLENOL PO) Take by mouth. Taking 650mg  as needed      . aspirin 325 MG EC tablet Take 325 mg by mouth daily.        Dakota Dakota [name] 30-100 MG CAPS Take 100 mg by mouth daily.        [name] doxazosin (CARDURA) 4 MG tablet Take 1 mg by mouth at bedtime.       . finasteride (PROSCAR) 5 MG tablet Take 5 mg by mouth daily.        . furosemide (LASIX) 20 MG tablet Take 1 tablet (20 mg total) by mouth daily. 2-3 times a week  30 tablet  11  . glimepiride (AMARYL) 2 MG tablet Take 2 mg by mouth daily before breakfast.        . glucose 4 GM chewable tablet Chew 16 g by mouth as needed.        . Multiple Vitamin (MULTIVITAMIN) tablet Take 1 tablet by mouth daily.        . nitroGLYCERIN (NITROLINGUAL) 0.4 MG/SPRAY spray Place 1 spray under the tongue every 5 (five) minutes as needed.  12 g  prn  . pantoprazole (PROTONIX) 40 MG tablet as directed.      . pravastatin  (PRAVACHOL) 80 MG tablet Take 80 mg by mouth daily.        [name] losartan (COZAAR) 50 MG tablet Take 1 tablet (50 mg total) by mouth daily.  30 tablet  6    Allergies  Allergen Reactions  . Aceon (Perindopril Erbumine) Cough  . Sulfa Antibiotics   . Nitroglycerin     Patches, Dakota [name], HA    Patient Active Problem List  Diagnosis  . Hx of CABG  . Left bundle branch block  . Diabetes mellitus  . Hypercholesterolemia  . Chronic combined systolic and diastolic congestive heart failure    History  Smoking status  . Never Smoker   Smokeless tobacco  . Not on file    History  Alcohol Use No    Family History  Problem Relation Age of Onset  . Ulcers Mother   . Stroke Mother     Review of Systems: Constitutional: no fever chills diaphoresis or fatigue or change in weight.  Head and neck: no hearing loss, no epistaxis, no photophobia or visual disturbance. Respiratory: No cough, shortness of breath or wheezing. Cardiovascular: No  chest pain peripheral edema, palpitations. Gastrointestinal: No abdominal distention, no abdominal pain, no change in bowel habits hematochezia or melena. Genitourinary: No dysuria, no frequency, no urgency, no nocturia. Musculoskeletal:No arthralgias, no back pain, no gait disturbance or myalgias. Neurological: No dizziness, no headaches, no numbness, no seizures, no syncope, no weakness, no tremors. Hematologic: No lymphadenopathy, no easy bruising. Psychiatric: No confusion, no hallucinations, no sleep disturbance.    Physical Exam: Filed Vitals:   04/11/12 1517  BP: 100/60  Pulse: 66   The general appearance reveals an elderly gentleman in no acute distress.The head and neck exam reveals pupils equal and reactive.  Extraocular movements are full.  There is no scleral icterus.  The mouth and pharynx are normal.  The neck is supple.  The carotids reveal no bruits.  The jugular venous pressure is normal.  The  thyroid is not enlarged.  There is no  lymphadenopathy.  The chest is clear to percussion and auscultation.  There are no rales or rhonchi.  Expansion of the chest is symmetrical.  The precordium is quiet.  The first heart sound is normal.  The second heart sound is physiologically split.  There is no murmur gallop rub or click.  There is no abnormal lift or heave.  The abdomen is soft and nontender.  The bowel sounds are normal.  The liver and spleen are not enlarged.  There are no abdominal masses.  There are no abdominal bruits.  Extremities reveal good pedal pulses.  There is no phlebitis or edema.  There is no cyanosis or clubbing.  Strength is normal and symmetrical in all extremities.  There is no lateralizing weakness.  There are no sensory deficits.  The skin is warm and dry.  There is no rash.    Assessment / Plan: Continue same medication but drank more water.  We are also putting him on a different blood pressure pill.  He will stop Diovan and switched to generic losartan 50 mg one daily.  This may allow for a slightly higher resting blood pressure.  Await results of today's blood work.  Recheck in 3 months for followup office visit

## 2012-04-11 NOTE — Assessment & Plan Note (Signed)
The patient has had exertional dyspnea.  He is not having any orthopnea or paroxysmal nocturnal dyspnea.  He states that he feels better when he is walking around at work as opposed to sitting around and doing nothing.  He works as a Holiday representative at [employer].

## 2012-04-11 NOTE — Assessment & Plan Note (Signed)
The patient is on pravastatin for his hypercholesterolemia and his ischemic heart disease status post CABG.

## 2012-04-11 NOTE — Assessment & Plan Note (Signed)
The patient suspects that he may be "dehydrated".  He may be correct because he drinks very little fluids during the day.  He does take his furosemide to her 3 times a week.  I have encouraged him to increase his water intake.  We are checking a basal metabolic panel today.

## 2012-04-11 NOTE — Patient Instructions (Addendum)
Stop taking Diovan.  Start Losartan 50mg  daily.  Drink more water.  Have BMET drawn today.  Your physician recommends that you schedule a follow-up appointment in: 3 months with Dr. Patty Sermons.

## 2012-04-12 LAB — BASIC METABOLIC PANEL
CO2: 28 mEq/L (ref 19–32)
Calcium: 8.9 mg/dL (ref 8.4–10.5)
GFR: 91.23 mL/min (ref >60.00)
Glucose, Bld: 90 mg/dL (ref 70–99)
Potassium: 4.4 mEq/L (ref 3.5–5.1)
Sodium: 140 mEq/L (ref 135–145)

## 2012-04-12 NOTE — Progress Notes (Signed)
Quick Note:  Please report to patient. The recent labs are stable. Continue same medication and careful diet. Continue to drink plenty of water ______ 

## 2012-04-18 NOTE — Addendum Note (Signed)
Addended by: Reine Just on: 04/18/2012 12:07 PM   Modules accepted: Orders

## 2012-06-28 ENCOUNTER — Ambulatory Visit (INDEPENDENT_AMBULATORY_CARE_PROVIDER_SITE_OTHER): Payer: BC Managed Care – PPO | Admitting: Cardiology

## 2012-06-28 ENCOUNTER — Encounter: Payer: Self-pay | Admitting: Cardiology

## 2012-06-28 VITALS — BP 126/64 | HR 62 | Ht 67.0 in | Wt 146.4 lb

## 2012-06-28 DIAGNOSIS — R5381 Other malaise: Secondary | ICD-10-CM

## 2012-06-28 DIAGNOSIS — I5042 Chronic combined systolic (congestive) and diastolic (congestive) heart failure: Secondary | ICD-10-CM

## 2012-06-28 DIAGNOSIS — R5383 Other fatigue: Secondary | ICD-10-CM

## 2012-06-28 DIAGNOSIS — I509 Heart failure, unspecified: Secondary | ICD-10-CM

## 2012-06-28 DIAGNOSIS — E7800 Pure hypercholesterolemia, unspecified: Secondary | ICD-10-CM

## 2012-06-28 DIAGNOSIS — Z951 Presence of aortocoronary bypass graft: Secondary | ICD-10-CM

## 2012-06-28 NOTE — Assessment & Plan Note (Signed)
Patient has a history of hypercholesterolemia.  He is on pravastatin.  Is not having any myalgias

## 2012-06-28 NOTE — Assessment & Plan Note (Signed)
His energy level has been little better since last visit.

## 2012-06-28 NOTE — Progress Notes (Signed)
Dakota [name] Date of Birth:  [birth year] [name] [address] [name], [place]  [address] [phone]         Fax   [phone]  History of Present Illness: This pleasant elderly 78 year old gentleman is seen for followup office visit.  He has occasional chest pain. He has a past history of ischemic heart disease and is status post coronary artery bypass graft surgery. He has combined chronic systolic and diastolic CHF and he has a known left bundle branch block. His echocardiogram 2 years ago showed an ejection fraction of 40-45% and a more recent echo done on 12/23/11 showed that his ejection fraction had increased to 50-55%. He does have diastolic dysfunction. His coronary bypass graft surgery was in 2005.  Since last visit he has been doing reasonably well.  He said no severe recent chest pain.  He remains short of breath with moderate exertion.  He has poor balance when he walks and uses a cane.  He has been eating better and his weight is up 5 pounds.   Current Outpatient Prescriptions  Medication Sig Dispense Refill  . Acetaminophen (TYLENOL PO) Take by mouth. Taking 650mg  as needed      . aspirin 325 MG EC tablet Take 325 mg by mouth daily.        [name] Sodium 30-100 MG CAPS Take 100 mg by mouth daily.        [name] doxazosin (CARDURA) 4 MG tablet Take 1 mg by mouth at bedtime.       . finasteride (PROSCAR) 5 MG tablet Take 5 mg by mouth daily.        . furosemide (LASIX) 20 MG tablet Take 1 tablet (20 mg total) by mouth daily. 2-3 times a week  30 tablet  11  . glimepiride (AMARYL) 2 MG tablet Take 2 mg by mouth daily before breakfast.        . glucose 4 GM chewable tablet Chew 16 g by mouth as needed.        [name] losartan (COZAAR) 50 MG tablet Take 1 tablet (50 mg total) by mouth daily.  30 tablet  6  . Multiple Vitamin (MULTIVITAMIN) tablet Take 1 tablet by mouth daily.        . nitroGLYCERIN (NITROLINGUAL) 0.4 MG/SPRAY spray Place 1 spray under  the tongue every 5 (five) minutes as needed.  12 g  prn  . pantoprazole (PROTONIX) 40 MG tablet as directed.      . pravastatin (PRAVACHOL) 80 MG tablet Take 80 mg by mouth daily.          Allergies  Allergen Reactions  . Aceon (Perindopril Erbumine) Cough  . Sulfa Antibiotics   . Nitroglycerin     Patches, [name], HA    Patient Active Problem List  Diagnosis  . Hx of CABG  . Left bundle branch block  . Diabetes mellitus  . Hypercholesterolemia  . Chronic combined systolic and diastolic congestive heart failure  . Malaise and fatigue    History  Smoking status  . Never Smoker   Smokeless tobacco  . Not on file    History  Alcohol Use No    Family History  Problem Relation Age of Onset  . Ulcers Mother   . Stroke Mother     Review of Systems: Constitutional: no fever chills diaphoresis or fatigue or change in weight.  Head and neck: no hearing loss, no epistaxis, no photophobia or visual disturbance. Respiratory: No  cough, shortness of breath or wheezing. Cardiovascular: No chest pain peripheral edema, palpitations. Gastrointestinal: No abdominal distention, no abdominal pain, no change in bowel habits hematochezia or melena. Genitourinary: No dysuria, no frequency, no urgency, no nocturia. Musculoskeletal:No arthralgias, no back pain, no gait disturbance or myalgias. Neurological: No dizziness, no headaches, no numbness, no seizures, no syncope, no weakness, no tremors. Hematologic: No lymphadenopathy, no easy bruising. Psychiatric: No confusion, no hallucinations, no sleep disturbance.    Physical Exam: Filed Vitals:   06/28/12 1524  BP: 126/64  Pulse: 62   general appearance reveals an elderly gentleman in no acute distress.The head and neck exam reveals pupils equal and reactive.  Extraocular movements are full.  There is no scleral icterus.  The mouth and pharynx are normal.  The neck is supple.  The carotids reveal no bruits.  The jugular venous  pressure is normal.  The  thyroid is not enlarged.  There is no lymphadenopathy.  The chest is clear to percussion and auscultation.  There are no rales or rhonchi.  Expansion of the chest is symmetrical.  The precordium is quiet.  The first heart sound is normal.  The second heart sound is physiologically split.  There is no murmur gallop rub or click.  There is no abnormal lift or heave.  The abdomen is soft and nontender.  The bowel sounds are normal.  The liver and spleen are not enlarged.  There are no abdominal masses.  There are no abdominal bruits.  Extremities reveal good pedal pulses.  There is no phlebitis or edema.  There is no cyanosis or clubbing.  Strength is normal and symmetrical in all extremities.  There is no lateralizing weakness.  There are no sensory deficits.  The skin is warm and dry.  There is no rash.     Assessment / Plan: Overall the patient appears to be stable.  Dr. [name] has checked some recent labs and has checked him for [name] vitamin D levels. The patient will continue same medication and be rechecked here in 3 months for followup office visit and EKG.

## 2012-06-28 NOTE — Assessment & Plan Note (Signed)
The patient has had no prolonged chest discomfort.

## 2012-06-28 NOTE — Assessment & Plan Note (Signed)
The patient has combined systolic and diastolic congestive heart failure.  He sleeps with his head slightly elevated.  When he sleeps on his back he snores according to his daughter but he does not have periods of audible sleep apnea.

## 2012-06-28 NOTE — Patient Instructions (Addendum)
Your physician recommends that you continue on your current medications as directed. Please refer to the Current Medication list given to you today.  Your physician recommends that you schedule a follow-up appointment in: 3 months  

## 2012-07-04 ENCOUNTER — Encounter: Payer: Self-pay | Admitting: Cardiology

## 2012-09-26 ENCOUNTER — Encounter: Payer: Self-pay | Admitting: Cardiology

## 2012-09-26 ENCOUNTER — Ambulatory Visit (INDEPENDENT_AMBULATORY_CARE_PROVIDER_SITE_OTHER): Payer: BC Managed Care – PPO | Admitting: Cardiology

## 2012-09-26 VITALS — BP 143/44 | HR 65 | Ht 67.0 in | Wt 143.6 lb

## 2012-09-26 DIAGNOSIS — R5381 Other malaise: Secondary | ICD-10-CM

## 2012-09-26 DIAGNOSIS — I5042 Chronic combined systolic (congestive) and diastolic (congestive) heart failure: Secondary | ICD-10-CM

## 2012-09-26 DIAGNOSIS — R05 Cough: Secondary | ICD-10-CM

## 2012-09-26 DIAGNOSIS — Z951 Presence of aortocoronary bypass graft: Secondary | ICD-10-CM

## 2012-09-26 DIAGNOSIS — R5383 Other fatigue: Secondary | ICD-10-CM

## 2012-09-26 DIAGNOSIS — R059 Cough, unspecified: Secondary | ICD-10-CM

## 2012-09-26 DIAGNOSIS — I509 Heart failure, unspecified: Secondary | ICD-10-CM

## 2012-09-26 NOTE — Progress Notes (Signed)
Beverely Low Date of Birth:  Nov 11, 1925 [facility] [address] [place], [place]  [address] [phone]         Fax   [phone]  History of Present Illness: This pleasant elderly 78 year old gentleman is seen for followup office visit. He has occasional chest pain. He has a past history of ischemic heart disease and is status post coronary artery bypass graft surgery. He has combined chronic systolic and diastolic CHF and he has a known left bundle branch block. His echocardiogram 2 years ago showed an ejection fraction of 40-45% and a more recent echo done on 12/23/11 showed that his ejection fraction had increased to 50-55%. He does have diastolic dysfunction. His coronary bypass graft surgery was in 2005. Since last visit he has been doing reasonably well. He said no severe recent chest pain. He remains short of breath with moderate exertion. He has poor balance when he walks and uses a cane.    Current Outpatient Prescriptions  Medication Sig Dispense Refill  . Acetaminophen (TYLENOL PO) Take by mouth. Taking 650mg  as needed      . aspirin 325 MG EC tablet Take 325 mg by mouth daily.        [name] azithromycin (ZITHROMAX) 250 MG tablet       . Casanthranol-Docusate Sodium 30-100 MG CAPS Take 100 mg by mouth daily.        [name] doxazosin (CARDURA) 4 MG tablet Take 1 mg by mouth at bedtime.       . furosemide (LASIX) 20 MG tablet Take 1 tablet (20 mg total) by mouth daily. 2-3 times a week  30 tablet  11  . glimepiride (AMARYL) 2 MG tablet Take 2 mg by mouth daily before breakfast.        . glucose 4 GM chewable tablet Chew 16 g by mouth as needed.        [name] losartan (COZAAR) 50 MG tablet Take 1 tablet (50 mg total) by mouth daily.  30 tablet  6  . Multiple Vitamin (MULTIVITAMIN) tablet Take 1 tablet by mouth daily.        . nitroGLYCERIN (NITROLINGUAL) 0.4 MG/SPRAY spray Place 1 spray under the tongue every 5 (five) minutes as needed.  12 g  prn  . pantoprazole (PROTONIX)  40 MG tablet as directed.      . pravastatin (PRAVACHOL) 80 MG tablet Take 80 mg by mouth daily.        [name] PROSCAR 5 MG tablet         Allergies  Allergen Reactions  . Aceon (Perindopril Erbumine) Cough  . Sulfa Antibiotics   . Nitroglycerin     Patches, low BP, HA    Patient Active Problem List  Diagnosis  . Hx of CABG  . Left bundle branch block  . Diabetes mellitus  . Hypercholesterolemia  . Chronic combined systolic and diastolic congestive heart failure  . Malaise and fatigue    History  Smoking status  . Never Smoker   Smokeless tobacco  . Not on file    History  Alcohol Use No    Family History  Problem Relation Age of Onset  . Ulcers Mother   . Stroke Mother     Review of Systems: Constitutional: no fever chills diaphoresis or fatigue or change in weight.  Head and neck: no hearing loss, no epistaxis, no photophobia or visual disturbance. Respiratory: No cough, shortness of breath or wheezing. Cardiovascular: No chest pain peripheral edema, palpitations.  Gastrointestinal: No abdominal distention, no abdominal pain, no change in bowel habits hematochezia or melena. Genitourinary: No dysuria, no frequency, no urgency, no nocturia. Musculoskeletal:No arthralgias, no back pain, no gait disturbance or myalgias. Neurological: No dizziness, no headaches, no numbness, no seizures, no syncope, no weakness, no tremors. Hematologic: No lymphadenopathy, no easy bruising. Psychiatric: No confusion, no hallucinations, no sleep disturbance.    Physical Exam: Filed Vitals:   09/26/12 1521  BP: 143/44  Pulse: 65   the general appearance reveals a well-developed elderly gentleman in no distress.Pupils equal and reactive.   Extraocular Movements are full.  There is no scleral icterus.  The mouth and pharynx are normal.  The neck is supple.  The carotids reveal no bruits.  The jugular venous pressure is normal.  The thyroid is not enlarged.  There is no  lymphadenopathy.  Chest reveals scattered rhonchi bilaterally.  Heart reveals a soft systolic ejection murmur at the base. The abdomen is soft and nontender without hepatosplenomegaly or masses. Extremities show no phlebitis or significant edema.  Pedal pulses are present. Strength is normal and symmetrical in all extremities.  There is no lateralizing weakness.  There are no sensory deficits.  The skin is warm and dry.  There is no rash.    Assessment / Plan: Continue same medication.  Recheck in 4 months for followup office visit and EKG.  Add plain Mucinex twice a day as needed for loose cough.

## 2012-09-26 NOTE — Assessment & Plan Note (Signed)
His energy level has improved since last visit.  His weight is down 3 pounds since last visit.

## 2012-09-26 NOTE — Assessment & Plan Note (Signed)
His symptoms of onset of congestive heart failure.  He is still working as a Holiday representative at [employer].  He is not having any paroxysmal nocturnal dyspnea or significant peripheral edema.

## 2012-09-26 NOTE — Patient Instructions (Addendum)
Your physician recommends that you continue on your current medications as directed. Please refer to the Current Medication list given to you today.  Your physician wants you to follow-up in: 4 month ov/ekg  You will receive a reminder letter in the mail two months in advance. If you don't receive a letter, please call our office to schedule the follow-up appointment.  

## 2012-09-26 NOTE — Assessment & Plan Note (Signed)
The patient has had a recent cough which began with body aches and a slight fever.  His primary care physician put him on a Z-Pak.  His sputum is not purulent now but he is still coughing up a lot of mucus and we suggested that he add Mucinex to his regimen.

## 2012-09-26 NOTE — Assessment & Plan Note (Signed)
The patient has not been experiencing any recent chest pain or angina pectoris. 

## 2013-01-30 ENCOUNTER — Ambulatory Visit (INDEPENDENT_AMBULATORY_CARE_PROVIDER_SITE_OTHER): Payer: BC Managed Care – PPO | Admitting: Cardiology

## 2013-01-30 ENCOUNTER — Encounter: Payer: Self-pay | Admitting: Cardiology

## 2013-01-30 VITALS — BP 120/62 | HR 57 | Ht 66.5 in | Wt 139.1 lb

## 2013-01-30 DIAGNOSIS — Z951 Presence of aortocoronary bypass graft: Secondary | ICD-10-CM

## 2013-01-30 DIAGNOSIS — I509 Heart failure, unspecified: Secondary | ICD-10-CM

## 2013-01-30 DIAGNOSIS — I5042 Chronic combined systolic (congestive) and diastolic (congestive) heart failure: Secondary | ICD-10-CM

## 2013-01-30 NOTE — Progress Notes (Signed)
Beverely Low Date of Birth:  04/20/1926 [facility] [address] [place], [place]  [address] [phone]         Fax   [phone]  History of Present Illness: This pleasant elderly 79 year old gentleman is seen for followup office visit. He has occasional chest pain. He has a past history of ischemic heart disease and is status post coronary artery bypass graft surgery. He has combined chronic systolic and diastolic CHF and he has a known left bundle branch block. His echocardiogram 2 years ago showed an ejection fraction of 40-45% and a more recent echo done on 12/23/11 showed that his ejection fraction had increased to 50-55%. He does have diastolic dysfunction. His coronary bypass graft surgery was in 2005. Since last visit he has been doing reasonably well. He said no severe recent chest pain. He remains short of breath with moderate exertion. He has poor balance when he walks and uses a cane.    Current Outpatient Prescriptions  Medication Sig Dispense Refill  . Acetaminophen (TYLENOL PO) Take by mouth. Taking 650mg  as needed      . aspirin 325 MG EC tablet Take 325 mg by mouth daily.        [name] Sodium 30-100 MG CAPS Take 100 mg by mouth daily.        [name] doxazosin (CARDURA) 4 MG tablet Take 1 mg by mouth at bedtime.       . furosemide (LASIX) 20 MG tablet Take 1 tablet (20 mg total) by mouth daily. 2-3 times a week  30 tablet  11  . glimepiride (AMARYL) 2 MG tablet Take 2 mg by mouth daily before breakfast.        . glucose 4 GM chewable tablet Chew 16 g by mouth as needed.        [name] losartan (COZAAR) 50 MG tablet Take 1 tablet (50 mg total) by mouth daily.  30 tablet  6  . Multiple Vitamin (MULTIVITAMIN) tablet Take 1 tablet by mouth daily.        . nitroGLYCERIN (NITROLINGUAL) 0.4 MG/SPRAY spray Place 1 spray under the tongue every 5 (five) minutes as needed.  12 g  prn  . pantoprazole (PROTONIX) 40 MG tablet as directed.      . pravastatin  (PRAVACHOL) 80 MG tablet Take 80 mg by mouth daily.        [name] PROSCAR 5 MG tablet       . azithromycin (ZITHROMAX) 250 MG tablet        No current facility-administered medications for this visit.    Allergies  Allergen Reactions  . Aceon (Perindopril Erbumine) Cough  . Sulfa Antibiotics   . Nitroglycerin     Patches, low BP, HA    Patient Active Problem List  Diagnosis  . Hx of CABG  . Left bundle branch block  . Diabetes mellitus  . Hypercholesterolemia  . Chronic combined systolic and diastolic congestive heart failure  . Malaise and fatigue  . Cough    History  Smoking status  . Never Smoker   Smokeless tobacco  . Not on file    History  Alcohol Use No    Family History  Problem Relation Age of Onset  . Ulcers Mother   . Stroke Mother     Review of Systems: Constitutional: no fever chills diaphoresis or fatigue or change in weight.  Head and neck: no hearing loss, no epistaxis, no photophobia or visual disturbance. Respiratory: No cough,  shortness of breath or wheezing. Cardiovascular: No chest pain peripheral edema, palpitations. Gastrointestinal: No abdominal distention, no abdominal pain, no change in bowel habits hematochezia or melena. Genitourinary: No dysuria, no frequency, no urgency, no nocturia. Musculoskeletal:No arthralgias, no back pain, no gait disturbance or myalgias. Neurological: No dizziness, no headaches, no numbness, no seizures, no syncope, no weakness, no tremors. Hematologic: No lymphadenopathy, no easy bruising. Psychiatric: No confusion, no hallucinations, no sleep disturbance.    Physical Exam: Filed Vitals:   01/30/13 1504  BP: 120/62  Pulse: 57   the general appearance reveals an elderly gentleman in no acute distress.The head and neck exam reveals pupils equal and reactive.  Extraocular movements are full.  There is no scleral icterus.  The mouth and pharynx are normal.  The neck is supple.  The carotids reveal no bruits.   The jugular venous pressure is normal.  The  thyroid is not enlarged.  There is no lymphadenopathy.  The chest is clear to percussion and auscultation.  There are no rales or rhonchi.  Expansion of the chest is symmetrical.  The precordium is quiet.  The first heart sound is normal.  The second heart sound is physiologically split.  There is no murmur gallop rub or click.  There is no abnormal lift or heave.  The abdomen is soft and nontender.  The bowel sounds are normal.  The liver and spleen are not enlarged.  There are no abdominal masses.  There are no abdominal bruits.  Extremities reveal good pedal pulses.  There is trace ankle edema.  There is no cyanosis or clubbing.  Strength is normal and symmetrical in all extremities.  There is no lateralizing weakness.  There are no sensory deficits.  The skin is warm and dry.  There is no rash.  EKG today shows sinus bradycardia with first degree AV block and premature atrial complexes.  There is left axis deviation and left bundle branch block.  No significant change since 04/11/12.  Assessment / Plan: Continue on same medication.  He continues to work part-time at [employer] which I applaud.  Recheck in 4 months for followup office visit

## 2013-01-30 NOTE — Patient Instructions (Addendum)
Your physician recommends that you continue on your current medications as directed. Please refer to the Current Medication list given to you today.  Your physician wants you to follow-up in: 4 MONTHS.  You will receive a reminder letter in the mail two months in advance. If you don't receive a letter, please call our office to schedule the follow-up appointment.  

## 2013-01-30 NOTE — Assessment & Plan Note (Signed)
The patient has not had any worsening of his dyspnea since last visit.  He has not been experiencing any orthopnea or paroxysmal nocturnal dyspnea and no significant pedal edema.

## 2013-01-30 NOTE — Assessment & Plan Note (Signed)
The patient has had no recurrent angina pectoris.  He has not been having to take any sublingual nitroglycerin.

## 2013-01-30 NOTE — Assessment & Plan Note (Signed)
The patient has not been having any hypoglycemic episodes 

## 2013-02-24 ENCOUNTER — Other Ambulatory Visit: Payer: Self-pay | Admitting: *Deleted

## 2013-02-24 MED ORDER — LOSARTAN POTASSIUM 50 MG PO TABS: 50.0000 mg | ORAL_TABLET | Freq: Every day | ORAL | Status: DC

## 2013-04-03 ENCOUNTER — Other Ambulatory Visit: Payer: Self-pay | Admitting: *Deleted

## 2013-04-03 MED ORDER — FUROSEMIDE 20 MG PO TABS: 20.0000 mg | ORAL_TABLET | Freq: Every day | ORAL | Status: DC

## 2013-06-05 ENCOUNTER — Ambulatory Visit (INDEPENDENT_AMBULATORY_CARE_PROVIDER_SITE_OTHER): Payer: BC Managed Care – PPO | Admitting: Cardiology

## 2013-06-05 ENCOUNTER — Encounter: Payer: Self-pay | Admitting: Cardiology

## 2013-06-05 VITALS — BP 132/64 | HR 72 | Ht 67.0 in | Wt 141.0 lb

## 2013-06-05 DIAGNOSIS — I509 Heart failure, unspecified: Secondary | ICD-10-CM

## 2013-06-05 DIAGNOSIS — E7800 Pure hypercholesterolemia, unspecified: Secondary | ICD-10-CM

## 2013-06-05 DIAGNOSIS — Z951 Presence of aortocoronary bypass graft: Secondary | ICD-10-CM

## 2013-06-05 DIAGNOSIS — I5042 Chronic combined systolic (congestive) and diastolic (congestive) heart failure: Secondary | ICD-10-CM

## 2013-06-05 NOTE — Assessment & Plan Note (Signed)
Patient has a history of hypercholesterolemia and is on pravastatin.  He is eating better and has a good appetite and his weight is up 2 pounds.

## 2013-06-05 NOTE — Progress Notes (Signed)
Dakota [name] Date of Birth:  1926-03-27 [facility] [address] [place], [place]  [address] [phone]         Fax   [phone]  History of Present Illness: This pleasant elderly 79 year old gentleman is seen for followup office visit. He has occasional chest pain. He has a past history of ischemic heart disease and is status post coronary artery bypass graft surgery. He has combined chronic systolic and diastolic CHF and he has a known left bundle branch block. His echocardiogram 2 years ago showed an ejection fraction of 40-45% and a more recent echo done on 12/23/11 showed that his ejection fraction had increased to 50-55%. He does have diastolic dysfunction. His coronary bypass graft surgery was in 2005. Since last visit he has been doing reasonably well. He said no severe recent chest pain. He remains short of breath with moderate exertion. He has poor balance when he walks and uses a cane.  He is still working at [employer].  He enjoys the work.   Current Outpatient Prescriptions  Medication Sig Dispense Refill  . Acetaminophen (TYLENOL PO) Take by mouth. Taking 650mg  as needed      . aspirin EC 81 MG tablet Take 81 mg by mouth daily.      [name] Sodium 30-100 MG CAPS Take 100 mg by mouth daily.        [name] doxazosin (CARDURA) 4 MG tablet Take 1 mg by mouth at bedtime.       . furosemide (LASIX) 20 MG tablet Take 20 mg by mouth. 2-3 times a week      . glimepiride (AMARYL) 2 MG tablet Take 1 mg by mouth daily before breakfast.       . glucose 4 GM chewable tablet Chew 16 g by mouth as needed.        [name] losartan (COZAAR) 50 MG tablet Take 1 tablet (50 mg total) by mouth daily.  30 tablet  6  . Multiple Vitamin (MULTIVITAMIN) tablet Take 1 tablet by mouth daily.        . nitroGLYCERIN (NITROLINGUAL) 0.4 MG/SPRAY spray Place 1 spray under the tongue every 5 (five) minutes as needed.  12 g  prn  . pantoprazole (PROTONIX) 40 MG tablet as directed.      .  pravastatin (PRAVACHOL) 80 MG tablet Take 80 mg by mouth daily.        [name] PROSCAR 5 MG tablet Take 5 mg by mouth daily.        No current facility-administered medications for this visit.    Allergies  Allergen Reactions  . Aceon (Perindopril Erbumine) Cough  . Sulfa Antibiotics   . Nitroglycerin     Patches, [name], HA    Patient Active Problem List   Diagnosis Date Noted  . Hx of CABG 04/03/2011    Priority: High  . Chronic combined systolic and diastolic congestive heart failure 04/03/2011    Priority: High  . Cough 09/26/2012  . Malaise and fatigue 04/11/2012  . Left bundle branch block 04/03/2011  . Diabetes mellitus 04/03/2011  . Hypercholesterolemia 04/03/2011    History  Smoking status  . Never Smoker   Smokeless tobacco  . Not on file    History  Alcohol Use No    Family History  Problem Relation Age of Onset  . Ulcers Mother   . Stroke Mother     Review of Systems: Constitutional: no fever chills diaphoresis or fatigue or change in weight.  Head and neck: no hearing loss, no epistaxis, no photophobia or visual disturbance. Respiratory: No cough, shortness of breath or wheezing. Cardiovascular: No chest pain peripheral edema, palpitations. Gastrointestinal: No abdominal distention, no abdominal pain, no change in bowel habits hematochezia or melena. Genitourinary: No dysuria, no frequency, no urgency, no nocturia. Musculoskeletal:No arthralgias, no back pain, no gait disturbance or myalgias. Neurological: No dizziness, no headaches, no numbness, no seizures, no syncope, no weakness, no tremors. Hematologic: No lymphadenopathy, no easy bruising. Psychiatric: No confusion, no hallucinations, no sleep disturbance.    Physical Exam: Filed Vitals:   06/05/13 1513  BP: 132/64  Pulse: 72   the general appearance reveals a well-developed well-nourished elderly gentleman in no distress.The head and neck exam reveals pupils equal and reactive.  Extraocular  movements are full.  There is no scleral icterus.  The mouth and pharynx are normal.  The neck is supple.  The carotids reveal no bruits.  The jugular venous pressure is normal.  The  thyroid is not enlarged.  There is no lymphadenopathy.  The chest is clear to percussion and auscultation.  There are no rales or rhonchi.  Expansion of the chest is symmetrical.  The precordium is quiet.  The first heart sound is normal.  The second heart sound is physiologically split.  There is no murmur gallop rub or click.  There is no abnormal lift or heave.  The abdomen is soft and nontender.  The bowel sounds are normal.  The liver and spleen are not enlarged.  There are no abdominal masses.  There are no abdominal bruits.  Extremities reveal good pedal pulses.  There is no phlebitis or edema.  There is no cyanosis or clubbing.  Strength is normal and symmetrical in all extremities.  There is no lateralizing weakness.  There are no sensory deficits.  The skin is warm and dry.  There is no rash.     Assessment / Plan: Continue same medication.  Recheck in 4 months for office visit and EKG.

## 2013-06-05 NOTE — Assessment & Plan Note (Signed)
The patient has not been having any orthopnea or paroxysmal nocturnal dyspnea.  He takes his Lasix only occasionally.  He has not been having any swelling of his ankles recently.

## 2013-06-05 NOTE — Assessment & Plan Note (Signed)
The patient has not had any recurrent chest pain or angina.  He has not had to take any recent sublingual nitroglycerin

## 2013-06-05 NOTE — Patient Instructions (Addendum)
Your physician recommends that you continue on your current medications as directed. Please refer to the Current Medication list given to you today.  Your physician recommends that you schedule a follow-up appointment in: 4 month ov/ekg 

## 2013-10-09 ENCOUNTER — Encounter: Payer: Self-pay | Admitting: Cardiology

## 2013-10-09 ENCOUNTER — Ambulatory Visit (INDEPENDENT_AMBULATORY_CARE_PROVIDER_SITE_OTHER): Payer: BC Managed Care – PPO | Admitting: Cardiology

## 2013-10-09 VITALS — BP 138/60 | HR 62 | Wt 139.0 lb

## 2013-10-09 DIAGNOSIS — R0600 Dyspnea, unspecified: Secondary | ICD-10-CM

## 2013-10-09 DIAGNOSIS — R0609 Other forms of dyspnea: Secondary | ICD-10-CM

## 2013-10-09 DIAGNOSIS — R5381 Other malaise: Secondary | ICD-10-CM

## 2013-10-09 DIAGNOSIS — I509 Heart failure, unspecified: Secondary | ICD-10-CM

## 2013-10-09 DIAGNOSIS — I359 Nonrheumatic aortic valve disorder, unspecified: Secondary | ICD-10-CM

## 2013-10-09 DIAGNOSIS — I447 Left bundle-branch block, unspecified: Secondary | ICD-10-CM

## 2013-10-09 DIAGNOSIS — I5042 Chronic combined systolic (congestive) and diastolic (congestive) heart failure: Secondary | ICD-10-CM

## 2013-10-09 NOTE — Progress Notes (Signed)
Dakota [name] Date of Birth:  [birth year] [address] [place], [place]  [address] [phone]         Fax   [phone]  History of Present Illness: This pleasant elderly 79 year old gentleman is seen for followup office visit. He has occasional chest pain. He has a past history of ischemic heart disease and is status post coronary artery bypass graft surgery. He has combined chronic systolic and diastolic CHF and he has a known left bundle branch block. His echocardiogram 2 years ago showed an ejection fraction of 40-45% and a more recent echo done on 12/23/11 showed that his ejection fraction had increased to 50-55%. He does have diastolic dysfunction. His coronary bypass graft surgery was in 2005. Since last visit he has been doing reasonably well. He said no severe recent chest pain. He remains short of breath with moderate exertion. He has poor balance when he walks and uses a cane.  He is still working at [employer].  He enjoys the work.   Current Outpatient Prescriptions  Medication Sig Dispense Refill  . Acetaminophen (TYLENOL PO) Take by mouth. Taking 650mg  as needed      . aspirin EC 81 MG tablet Take 81 mg by mouth daily.      [name] Sodium 30-100 MG CAPS Take 100 mg by mouth daily.        [name] doxazosin (CARDURA) 4 MG tablet Take 1 mg by mouth at bedtime.       . furosemide (LASIX) 20 MG tablet Take 20 mg by mouth. 2-3 times a week      . glimepiride (AMARYL) 2 MG tablet Take 1 mg by mouth daily before breakfast.       . glucose 4 GM chewable tablet Chew 16 g by mouth as needed.        [name] losartan (COZAAR) 50 MG tablet Take 1 tablet (50 mg total) by mouth daily.  30 tablet  6  . meloxicam (MOBIC) 15 MG tablet Take 1 tablet by mouth daily.      . Multiple Vitamin (MULTIVITAMIN) tablet Take 1 tablet by mouth daily.        . nitroGLYCERIN (NITROLINGUAL) 0.4 MG/SPRAY spray Place 1 spray under the tongue every 5 (five) minutes as needed.  12 g  prn  .  pantoprazole (PROTONIX) 40 MG tablet as directed.      . pravastatin (PRAVACHOL) 80 MG tablet Take 80 mg by mouth daily.        [name] PROSCAR 5 MG tablet Take 5 mg by mouth daily.        No current facility-administered medications for this visit.    Allergies  Allergen Reactions  . Aceon [Perindopril Erbumine] Cough  . Sulfa Antibiotics   . Nitroglycerin     Patches, [name], HA    Patient Active Problem List   Diagnosis Date Noted  . Hx of CABG 04/03/2011    Priority: High  . Chronic combined systolic and diastolic congestive heart failure 04/03/2011    Priority: High  . Cough 09/26/2012  . Malaise and fatigue 04/11/2012  . Left bundle branch block 04/03/2011  . Diabetes mellitus 04/03/2011  . Hypercholesterolemia 04/03/2011    History  Smoking status  . Never Smoker   Smokeless tobacco  . Not on file    History  Alcohol Use No    Family History  Problem Relation Age of Onset  . Ulcers Mother   . Stroke Mother  Review of Systems: Constitutional: no fever chills diaphoresis or fatigue or change in weight.  Head and neck: no hearing loss, no epistaxis, no photophobia or visual disturbance. Respiratory: No cough, shortness of breath or wheezing. Cardiovascular: No chest pain peripheral edema, palpitations. Gastrointestinal: No abdominal distention, no abdominal pain, no change in bowel habits hematochezia or melena. Genitourinary: No dysuria, no frequency, no urgency, no nocturia. Musculoskeletal:No arthralgias, no back pain, no gait disturbance or myalgias. Neurological: No dizziness, no headaches, no numbness, no seizures, no syncope, no weakness, no tremors. Hematologic: No lymphadenopathy, no easy bruising. Psychiatric: No confusion, no hallucinations, no sleep disturbance.    Physical Exam: Filed Vitals:   10/09/13 1524  BP: 138/60  Pulse: 62   the general appearance reveals a well-developed well-nourished elderly gentleman in no distress.The head and  neck exam reveals pupils equal and reactive.  Extraocular movements are full.  There is no scleral icterus.  The mouth and pharynx are normal.  The neck is supple.  The carotids reveal no bruits.  The jugular venous pressure is normal.  The  thyroid is not enlarged.  There is no lymphadenopathy.  The chest is clear to percussion and auscultation.  There are no rales or rhonchi.  Expansion of the chest is symmetrical.  The precordium is quiet.  The first heart sound is normal.  The second heart sound is physiologically split.  There is no murmur gallop rub or click.  There is no abnormal lift or heave.  The abdomen is soft and nontender.  The bowel sounds are normal.  The liver and spleen are not enlarged.  There are no abdominal masses.  There are no abdominal bruits.  Extremities reveal good pedal pulses.  There is no phlebitis or edema.  There is no cyanosis or clubbing.  Strength is normal and symmetrical in all extremities.  There is no lateralizing weakness.  There are no sensory deficits.  The skin is warm and dry.  There is no rash. His weight is down 2 pounds since last visit.  EKG today shows normal sinus rhythm with first degree AV block and left bundle branch block with right axis deviation.  There is poor R-wave progression across the precordium.  Since the previous tracing of 01/30/13, the axis is more rightward.   Assessment / Plan: Continue same medication.  Recheck in 4 months for office visit

## 2013-10-09 NOTE — Assessment & Plan Note (Signed)
The patient complains of lack of energy.  He is not having any evidence of hematochezia or melena.  No fever chills or night sweats.  No constitutional signs or symptoms.

## 2013-10-09 NOTE — Assessment & Plan Note (Signed)
The patient has not had any orthopnea or paroxysmal nocturnal dyspnea.  He experiences shortness of breath when he bends over to tie his shoes et Karie Soda.  When he walks he feels that he is less well balanced.  He uses a cane.  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

## 2013-10-09 NOTE — Patient Instructions (Signed)
Continue same medications.   Your physician wants you to follow-up in: 4 months.  You will receive a reminder letter in the mail two months in advance. If you don't receive a letter, please call our office to schedule the follow-up appointment.  

## 2013-10-09 NOTE — Assessment & Plan Note (Signed)
The patient has chronic left bundle-branch block.  He has not had any high degrees of block or any Stokes-Adams attacks.

## 2014-02-10 ENCOUNTER — Other Ambulatory Visit: Payer: Self-pay | Admitting: Cardiology

## 2014-02-13 ENCOUNTER — Ambulatory Visit (INDEPENDENT_AMBULATORY_CARE_PROVIDER_SITE_OTHER): Payer: BC Managed Care – PPO | Admitting: Cardiology

## 2014-02-13 ENCOUNTER — Encounter: Payer: Self-pay | Admitting: Cardiology

## 2014-02-13 VITALS — BP 136/50 | HR 59 | Ht 67.0 in | Wt 134.0 lb

## 2014-02-13 DIAGNOSIS — R5381 Other malaise: Secondary | ICD-10-CM

## 2014-02-13 DIAGNOSIS — I359 Nonrheumatic aortic valve disorder, unspecified: Secondary | ICD-10-CM

## 2014-02-13 DIAGNOSIS — I509 Heart failure, unspecified: Secondary | ICD-10-CM

## 2014-02-13 DIAGNOSIS — Z951 Presence of aortocoronary bypass graft: Secondary | ICD-10-CM

## 2014-02-13 DIAGNOSIS — R634 Abnormal weight loss: Secondary | ICD-10-CM

## 2014-02-13 DIAGNOSIS — R5383 Other fatigue: Secondary | ICD-10-CM

## 2014-02-13 DIAGNOSIS — I5042 Chronic combined systolic (congestive) and diastolic (congestive) heart failure: Secondary | ICD-10-CM

## 2014-02-13 DIAGNOSIS — I447 Left bundle-branch block, unspecified: Secondary | ICD-10-CM

## 2014-02-13 LAB — CBC WITH DIFFERENTIAL/PLATELET
BASOS PCT: 0.4 % (ref 0.0–3.0)
Basophils Absolute: 0 10*3/uL (ref 0.0–0.1)
EOS PCT: 2.2 % (ref 0.0–5.0)
Eosinophils Absolute: 0.2 10*3/uL (ref 0.0–0.7)
HEMATOCRIT: 34 % — AB (ref 39.0–52.0)
HEMOGLOBIN: 10.6 g/dL — AB (ref 13.0–17.0)
LYMPHS ABS: 2.4 10*3/uL (ref 0.7–4.0)
Lymphocytes Relative: 34.1 % (ref 12.0–46.0)
MCHC: 31.1 g/dL (ref 30.0–36.0)
MONO ABS: 0.6 10*3/uL (ref 0.1–1.0)
MONOS PCT: 8.4 % (ref 3.0–12.0)
NEUTROS ABS: 3.9 10*3/uL (ref 1.4–7.7)
Neutrophils Relative %: 54.9 % (ref 43.0–77.0)
PLATELETS: 190 10*3/uL (ref 150.0–400.0)
RBC: 5.6 Mil/uL (ref 4.22–5.81)
RDW: 16.3 % — ABNORMAL HIGH (ref 11.5–14.6)
WBC: 7.1 10*3/uL (ref 4.5–10.5)

## 2014-02-13 LAB — BASIC METABOLIC PANEL
BUN: 20 mg/dL (ref 6–23)
CALCIUM: 8.9 mg/dL (ref 8.4–10.5)
CO2: 28 mEq/L (ref 19–32)
Chloride: 103 mEq/L (ref 96–112)
Creatinine, Ser: 1 mg/dL (ref 0.4–1.5)
GFR: 77.07 mL/min (ref >60.00)
GLUCOSE: 116 mg/dL — AB (ref 70–99)
POTASSIUM: 4.2 meq/L (ref 3.5–5.1)
SODIUM: 137 meq/L (ref 135–145)

## 2014-02-13 LAB — HEPATIC FUNCTION PANEL
ALBUMIN: 3.8 g/dL (ref 3.5–5.2)
ALT: 14 U/L (ref 0–53)
AST: 19 U/L (ref 0–37)
Alkaline Phosphatase: 54 U/L (ref 39–117)
Bilirubin, Direct: 0.1 mg/dL (ref 0.0–0.3)
TOTAL PROTEIN: 7 g/dL (ref 6.0–8.3)
Total Bilirubin: 0.8 mg/dL (ref 0.3–1.2)

## 2014-02-13 NOTE — Assessment & Plan Note (Signed)
The patient has not been having any symptoms of exacerbation of his CHF.  No orthopnea or paroxysmal nocturnal dyspnea.  No ankle edema

## 2014-02-13 NOTE — Assessment & Plan Note (Signed)
The patient has had very infrequent angina pectoris.  He rarely has to take any sublingual nitroglycerin.

## 2014-02-13 NOTE — Progress Notes (Signed)
Dakota [name] Date of Birth:  1925/12/25 [address] [place], [place]  [address] [phone]         Fax   [phone]  History of Present Illness: This pleasant elderly 80 year old gentleman is seen for followup office visit. He has occasional chest pain. He has a past history of ischemic heart disease and is status post coronary artery bypass graft surgery. He has combined chronic systolic and diastolic CHF and he has a known left bundle branch block. His echocardiogram 2 years ago showed an ejection fraction of 40-45% and a more recent echo done on 12/23/11 showed that his ejection fraction had increased to 50-55%. He does have diastolic dysfunction. His coronary bypass graft surgery was in 2005. Since last visit he has been doing reasonably well. He has had no severe recent chest pain. He remains short of breath with moderate exertion. He has poor balance when he walks and uses a cane.  He is still working at [employer].  He enjoys the work. Since last visit he has had problems with varicose veins in his left leg.  He has seen Dr. Aleda Grana.  Possible surgery was discussed after he undergoes a three-month trial of support stockings.   Current Outpatient Prescriptions  Medication Sig Dispense Refill  . Acetaminophen (TYLENOL PO) Take by mouth. Taking 650mg  as needed      . aspirin EC 81 MG tablet Take 81 mg by mouth daily.      Sarajane Marek Sodium 30-100 MG CAPS Take 100 mg by mouth daily.        . furosemide (LASIX) 20 MG tablet Take 20 mg by mouth. 2-3 times a week      . glimepiride (AMARYL) 2 MG tablet Take 1 mg by mouth daily before breakfast.       . glucose 4 GM chewable tablet Chew 16 g by mouth as needed.        [name] losartan (COZAAR) 50 MG tablet TAKE ONE TABLET BY MOUTH ONCE DAILY.  30 tablet  0  . meloxicam (MOBIC) 15 MG tablet Take 1 tablet by mouth daily.      . Multiple Vitamin (MULTIVITAMIN) tablet Take 1 tablet by mouth daily.        .  nitroGLYCERIN (NITROLINGUAL) 0.4 MG/SPRAY spray Place 1 spray under the tongue every 5 (five) minutes as needed.  12 g  prn  . pantoprazole (PROTONIX) 40 MG tablet as directed.      . pravastatin (PRAVACHOL) 80 MG tablet Take 80 mg by mouth daily.        [name] PROSCAR 5 MG tablet Take 5 mg by mouth daily.        No current facility-administered medications for this visit.    Allergies  Allergen Reactions  . Aceon [Perindopril Erbumine] Cough  . Sulfa Antibiotics   . Nitroglycerin     Patches, low BP, HA    Patient Active Problem List   Diagnosis Date Noted  . Hx of CABG 04/03/2011    Priority: High  . Chronic combined systolic and diastolic congestive heart failure 04/03/2011    Priority: High  . Cough 09/26/2012  . Malaise and fatigue 04/11/2012  . Left bundle branch block 04/03/2011  . Diabetes mellitus 04/03/2011  . Hypercholesterolemia 04/03/2011    History  Smoking status  . Never Smoker   Smokeless tobacco  . Not on file    History  Alcohol Use No    Family History  Problem Relation  Age of Onset  . Ulcers Mother   . Stroke Mother     Review of Systems: Constitutional: no fever chills diaphoresis or fatigue or change in weight.  Head and neck: no hearing loss, no epistaxis, no photophobia or visual disturbance. Respiratory: No cough, shortness of breath or wheezing. Cardiovascular: No chest pain peripheral edema, palpitations. Gastrointestinal: No abdominal distention, no abdominal pain, no change in bowel habits hematochezia or melena. Genitourinary: No dysuria, no frequency, no urgency, no nocturia. Musculoskeletal:No arthralgias, no back pain, no gait disturbance or myalgias. Neurological: No dizziness, no headaches, no numbness, no seizures, no syncope, no weakness, no tremors. Hematologic: No lymphadenopathy, no easy bruising. Psychiatric: No confusion, no hallucinations, no sleep disturbance.    Physical Exam: Filed Vitals:   02/13/14 1428  BP:  136/50  Pulse: 59   the general appearance reveals a well-developed well-nourished elderly gentleman in no distress.The head and neck exam reveals pupils equal and reactive.  Extraocular movements are full.  There is no scleral icterus.  The mouth and pharynx are normal.  The neck is supple.  The carotids reveal no bruits.  The jugular venous pressure is normal.  The  thyroid is not enlarged.  There is no lymphadenopathy.  The chest is clear to percussion and auscultation.  There are no rales or rhonchi.  Expansion of the chest is symmetrical.  The precordium is quiet.  The first heart sound is normal.  The second heart sound is physiologically split.  There is no murmur gallop rub or click.  There is no abnormal lift or heave.  The abdomen is soft and nontender.  The bowel sounds are normal.  The liver and spleen are not enlarged.  There are no abdominal masses.  There are no abdominal bruits.  Extremities reveal good pedal pulses.  There is no phlebitis or edema.  There is no cyanosis or clubbing.  Strength is normal and symmetrical in all extremities.  There is no lateralizing weakness.  There are no sensory deficits.  The skin is warm and dry.  There is no rash. His weight is down 5 pounds since last visit.     Assessment / Plan: Continue same medication.  Recheck in 4 months for office visit.  Await results of lab work today. In regard to his left leg discomfort from his varicose veins, I encouraged him to follow Dr. Grafton Folk advice.

## 2014-02-13 NOTE — Patient Instructions (Signed)
Will obtain labs today and call you with the results (cbc/hfp/bmet)  Your physician recommends that you continue on your current medications as directed. Please refer to the Current Medication list given to you today.  Your physician wants you to follow-up in: 4 month ov You will receive a reminder letter in the mail two months in advance. If you don't receive a letter, please call our office to schedule the follow-up appointment.

## 2014-02-13 NOTE — Assessment & Plan Note (Signed)
Patient has had malaise and fatigue which has been a chronic problem.  He has also had unintended 5 pound weight loss.  He is concerned about why he is losing weight.  We will check a CBC basal metabolic panel and hepatic function panel today

## 2014-02-14 NOTE — Progress Notes (Signed)
Quick Note:  Please report to patient. The recent labs are stable. Continue same medication and careful diet. The liver and kidney function is normal. He is anemic. This appears that it may be chronic or familial. Would follow this up with his medical doctor. Send copies to PCP ______

## 2014-02-16 ENCOUNTER — Telehealth: Payer: Self-pay | Admitting: Cardiology

## 2014-02-16 MED ORDER — LOSARTAN POTASSIUM 50 MG PO TABS: ORAL_TABLET | ORAL | Status: DC

## 2014-02-16 NOTE — Telephone Encounter (Signed)
New problem   Pt returning your call, stated he only has 30 mins to wait for you.

## 2014-02-16 NOTE — Telephone Encounter (Signed)
Advised patient of lab results and sent to Dr Osborne Casco

## 2014-02-16 NOTE — Telephone Encounter (Signed)
Message copied by Earvin Hansen on Fri Feb 16, 2014  4:41 PM ------      Message from: Darlin Coco      Created: Wed Feb 14, 2014  8:04 AM       Please report to patient.  The recent labs are stable. Continue same medication and careful diet.  The liver and kidney function is normal.  He is anemic.  This appears that it may be chronic or familial.  Would follow this up with his medical doctor.  Send copies to PCP ------

## 2014-06-11 ENCOUNTER — Other Ambulatory Visit: Payer: Self-pay | Admitting: Cardiology

## 2014-07-17 ENCOUNTER — Encounter: Payer: Self-pay | Admitting: Cardiology

## 2014-07-17 ENCOUNTER — Other Ambulatory Visit: Payer: Self-pay | Admitting: *Deleted

## 2014-07-17 ENCOUNTER — Ambulatory Visit (INDEPENDENT_AMBULATORY_CARE_PROVIDER_SITE_OTHER): Payer: BC Managed Care – PPO | Admitting: Cardiology

## 2014-07-17 VITALS — BP 106/54 | HR 82 | Wt 135.0 lb

## 2014-07-17 DIAGNOSIS — I509 Heart failure, unspecified: Secondary | ICD-10-CM

## 2014-07-17 DIAGNOSIS — R5383 Other fatigue: Secondary | ICD-10-CM

## 2014-07-17 DIAGNOSIS — I5042 Chronic combined systolic (congestive) and diastolic (congestive) heart failure: Secondary | ICD-10-CM

## 2014-07-17 DIAGNOSIS — E7800 Pure hypercholesterolemia, unspecified: Secondary | ICD-10-CM

## 2014-07-17 DIAGNOSIS — R5381 Other malaise: Secondary | ICD-10-CM

## 2014-07-17 DIAGNOSIS — Z951 Presence of aortocoronary bypass graft: Secondary | ICD-10-CM

## 2014-07-17 NOTE — Patient Instructions (Signed)
Your physician recommends that you continue on your current medications as directed. Please refer to the Current Medication list given to you today.  Your physician wants you to follow-up in: 4 month ov You will receive a reminder letter in the mail two months in advance. If you don't receive a letter, please call our office to schedule the follow-up appointment.  

## 2014-07-17 NOTE — Assessment & Plan Note (Signed)
Energy level is stable on current therapy

## 2014-07-17 NOTE — Assessment & Plan Note (Signed)
He sleeps on 2 pillows.  He is not having any paroxysmal nocturnal dyspnea.  He has exertional dyspnea but he keeps walking and he gets his second wind.  He has not been having any peripheral edema

## 2014-07-17 NOTE — Progress Notes (Signed)
Dakota [name] Date of Birth:  June 21, 1926 [address] [place], [place]  [address] [phone]        Fax   [phone]   History of Present Illness: This pleasant elderly 80 year old gentleman is seen for followup office visit. [name] He has a past history of ischemic heart disease and is status post coronary artery bypass graft surgery. He has combined chronic systolic and diastolic CHF and he has a known left bundle branch block. His echocardiogram 2 years ago showed an ejection fraction of 40-45% and a more recent echo done on 12/23/11 showed that his ejection fraction had increased to 50-55%. He does have diastolic dysfunction. His coronary bypass graft surgery was in 2005. Since last visit he has been doing reasonably well. He has had no severe recent chest pain. He remains short of breath with moderate exertion. He has poor balance when he walks and uses a cane. He is still working at [employer]. He enjoys the work.  Since last visit he has had problems with varicose veins in his left leg. He has seen Dr. Aleda Grana.  He has had successful surgery on the upper portion of his left thigh.  He is no longer having aching discomfort in the thigh when he is at work and he is very pleased with the surgical results.   Current Outpatient Prescriptions  Medication Sig Dispense Refill  . Acetaminophen (TYLENOL PO) Take by mouth. Taking 650mg  as needed      . aspirin EC 81 MG tablet Take 81 mg by mouth daily.      Sarajane Marek Sodium 30-100 MG CAPS Take 100 mg by mouth daily.        . furosemide (LASIX) 20 MG tablet TAKE 1 TABLET BY MOUTH DAILY.2-3 TIMES A WEEK  30 tablet  1  . glimepiride (AMARYL) 2 MG tablet Take 1 mg by mouth daily before breakfast.       . glucose 4 GM chewable tablet Chew 16 g by mouth as needed.        [name] losartan (COZAAR) 50 MG tablet TAKE ONE TABLET BY MOUTH ONCE DAILY.  90 tablet  3  . meloxicam (MOBIC) 15 MG tablet Take 1 tablet by  mouth daily.      . mirabegron ER (MYRBETRIQ) 50 MG TB24 tablet Take 50 mg by mouth daily.      . Multiple Vitamin (MULTIVITAMIN) tablet Take 1 tablet by mouth daily.        . Multiple Vitamins-Minerals (PRESERVISION AREDS 2 PO) Take by mouth.      . nitroGLYCERIN (NITROLINGUAL) 0.4 MG/SPRAY spray Place 1 spray under the tongue every 5 (five) minutes as needed.  12 g  prn  . ONE TOUCH ULTRA TEST test strip       . pantoprazole (PROTONIX) 40 MG tablet as directed.      . pravastatin (PRAVACHOL) 80 MG tablet Take 80 mg by mouth daily.        [name] PROSCAR 5 MG tablet Take 5 mg by mouth daily.        No current facility-administered medications for this visit.    Allergies  Allergen Reactions  . Aceon [Perindopril Erbumine] Cough  . Sulfa Antibiotics     Patient Active Problem List   Diagnosis Date Noted  . Hx of CABG 04/03/2011    Priority: High  . Chronic combined systolic and diastolic congestive heart failure 04/03/2011    Priority: High  .  Cough 09/26/2012  . Malaise and fatigue 04/11/2012  . Left bundle branch block 04/03/2011  . Diabetes mellitus 04/03/2011  . Hypercholesterolemia 04/03/2011    History  Smoking status  . Never Smoker   Smokeless tobacco  . Not on file    History  Alcohol Use No    Family History  Problem Relation Age of Onset  . Ulcers Mother   . Stroke Mother     Review of Systems: Constitutional: no fever chills diaphoresis or fatigue or change in weight.  Head and neck: no hearing loss, no epistaxis, no photophobia or visual disturbance. Respiratory: No cough, shortness of breath or wheezing. Cardiovascular: No chest pain peripheral edema, palpitations. Gastrointestinal: No abdominal distention, no abdominal pain, no change in bowel habits hematochezia or melena. Genitourinary: No dysuria, no frequency, no urgency, no nocturia. Musculoskeletal:No arthralgias, no back pain, no gait disturbance or myalgias. Neurological: No dizziness, no  headaches, no numbness, no seizures, no syncope, no weakness, no tremors. Hematologic: No lymphadenopathy, no easy bruising. Psychiatric: No confusion, no hallucinations, no sleep disturbance.    Physical Exam: Filed Vitals:   07/17/14 1500  BP: 106/54  Pulse: 82  The patient appears to be in no distress.  Head and neck exam reveals that the pupils are equal and reactive.  The extraocular movements are full.  There is no scleral icterus.  Mouth and pharynx are benign.  No lymphadenopathy.  No carotid bruits.  The jugular venous pressure is normal.  Thyroid is not enlarged or tender.  Chest is clear to percussion and auscultation.  No rales or rhonchi.  Expansion of the chest is symmetrical.  Heart reveals no abnormal lift or heave.  First and second heart sounds are normal.  There is no murmur gallop rub or click.  The abdomen is soft and nontender.  Bowel sounds are normoactive.  There is no hepatosplenomegaly or mass.  There are no abdominal bruits.  Extremities reveal no phlebitis or edema.  Pedal pulses are good.  There is no cyanosis or clubbing.  Neurologic exam is normal strength and no lateralizing weakness.  No sensory deficits.  Integument reveals no rash  EKG shows normal sinus rhythm with first-degree AV block and left bundle branch block with left axis deviation.  Since last tracing of 10/09/13, no significant change.  Assessment / Plan: 1.  Ischemic heart disease status post CABG in 2005, stable 2. combined systolic and diastolic chronic congestive heart failure 3. left bundle branch block 4. varicose veins of legs followed by Dr. Aleda Grana 5. previous unexplained weight loss has now stabilized  Continue same medication.  Recheck in 4 months for followup office visit.

## 2014-07-17 NOTE — Assessment & Plan Note (Signed)
He has not had to take any recent nitroglycerin.  He is not having much chest discomfort.  He continues to work out at [employer].  He enjoys working.

## 2014-10-21 ENCOUNTER — Other Ambulatory Visit: Payer: Self-pay | Admitting: Cardiology

## 2014-11-20 ENCOUNTER — Ambulatory Visit (INDEPENDENT_AMBULATORY_CARE_PROVIDER_SITE_OTHER): Payer: BLUE CROSS/BLUE SHIELD | Admitting: Cardiology

## 2014-11-20 ENCOUNTER — Encounter: Payer: Self-pay | Admitting: Cardiology

## 2014-11-20 VITALS — BP 132/62 | HR 63 | Ht 67.0 in | Wt 137.0 lb

## 2014-11-20 DIAGNOSIS — E7800 Pure hypercholesterolemia, unspecified: Secondary | ICD-10-CM

## 2014-11-20 DIAGNOSIS — Z951 Presence of aortocoronary bypass graft: Secondary | ICD-10-CM

## 2014-11-20 DIAGNOSIS — I447 Left bundle-branch block, unspecified: Secondary | ICD-10-CM

## 2014-11-20 DIAGNOSIS — I5042 Chronic combined systolic (congestive) and diastolic (congestive) heart failure: Secondary | ICD-10-CM

## 2014-11-20 NOTE — Progress Notes (Signed)
Cardiology Office Note   Date:  11/20/2014   ID:  Dakota [name], DOB [birth year], MRN [id number]  PCP:  [name], MD  Cardiologist:  Darlin Coco, MD   No chief complaint on file.     History of Present Illness: Dakota [name] is a 81 y.o. male who presents for follow-up office visit  This pleasant elderly 81 year old gentleman is seen for followup office visit. [name] He has a past history of ischemic heart disease and is status post coronary artery bypass graft surgery. He has combined chronic systolic and diastolic CHF and he has a known left bundle branch block. His echocardiogram  done on 12/23/11 showed that his ejection fraction had increased to 50-55%. He does have diastolic dysfunction. His coronary bypass graft surgery was in 2005. Since last visit he has been doing reasonably well. He has had no severe recent chest pain. He remains short of breath with moderate exertion. He has poor balance when he walks and uses a cane. He is still working at [employer]. He enjoys the work. His daughter drives him to work. Since last visit he has had problems with varicose veins in his left leg. He has seen Dr. Aleda Grana. He has had successful surgery on the upper portion of his left thigh. He is no longer having aching discomfort in the thigh when he is at work and he is very pleased with the surgical results.  Past Medical History  Diagnosis Date  . Smallpox   . Ischemic heart disease   . Diabetes mellitus     Past Surgical History  Procedure Laterality Date  . Hernia repair  1979    bilat repair  . Appendectomy    . Right foot      surgery  . Cardiac catheterization  1992    normal  ([place])  . Coronary artery bypass graft  12/2003  . Doppler echocardiography  07/15/10    EF 40-45%, aortic stenosis     Current Outpatient Prescriptions  Medication Sig Dispense Refill  . Acetaminophen (TYLENOL PO) Take by mouth. Taking 650mg  as needed    . aspirin EC 81 MG tablet Take  81 mg by mouth daily.    Sarajane Marek Sodium 30-100 MG CAPS Take 100 mg by mouth daily.      . furosemide (LASIX) 20 MG tablet TAKE 1 TABLET BY MOUTH DAILY 2 TO 3 TIMES A WEEK-NEEDS TO BE SEEN 30 tablet 6  . glimepiride (AMARYL) 2 MG tablet Take 1 mg by mouth daily before breakfast.     . glucose 4 GM chewable tablet Chew 16 g by mouth as needed.      [name] losartan (COZAAR) 50 MG tablet TAKE ONE TABLET BY MOUTH ONCE DAILY. 90 tablet 3  . meloxicam (MOBIC) 15 MG tablet Take 1 tablet by mouth daily.    . mirabegron ER (MYRBETRIQ) 50 MG TB24 tablet Take 50 mg by mouth daily.    . Multiple Vitamin (MULTIVITAMIN) tablet Take 1 tablet by mouth daily.      . Multiple Vitamins-Minerals (PRESERVISION AREDS 2 PO) Take by mouth.    [name] NITROSTAT 0.4 MG SL tablet Place 0.4 mg under the tongue every 5 (five) minutes as needed.     . ONE TOUCH ULTRA TEST test strip     . pantoprazole (PROTONIX) 40 MG tablet as directed.    . pravastatin (PRAVACHOL) 80 MG tablet Take 80 mg by mouth daily.      [name] PREVNAR 13 SUSP  injection Inject 0.5 mLs into the muscle once.     [name] PROSCAR 5 MG tablet Take 5 mg by mouth daily.     [name] ZOSTAVAX 77939 UNT/0.65ML injection Inject 0.65 mLs into the skin once.      No current facility-administered medications for this visit.    Allergies:   Aceon and Sulfa antibiotics    Social History:  The patient  reports that he has never smoked. He does not have any smokeless tobacco history on file. He reports that he does not drink alcohol.   Family History:  The patient's family history includes Stroke in his mother; Ulcers in his mother.    ROS:  Please see the history of present illness.   Otherwise, review of systems are positive for none.   All other systems are reviewed and negative.    PHYSICAL EXAM: VS:  BP 132/62 mmHg  Pulse 63  Ht 5\' 7"  (1.702 m)  Wt 137 lb (62.143 kg)  BMI 21.45 kg/m2 , BMI Body mass index is 21.45 kg/(m^2). GEN: Well nourished, well developed,  in no acute distress HEENT: normal Neck: no JVD, carotid bruits, or masses Cardiac: RRR; no murmurs, rubs, or gallops,no edema  Respiratory:  clear to auscultation bilaterally, normal work of breathing GI: soft, nontender, nondistended, + BS MS: no deformity or atrophy Skin: warm and dry, no rash Neuro:  Strength and sensation are intact Psych: euthymic mood, full affect   EKG:  EKG is not ordered today. The ekg ordered today demonstrates    Recent Labs: 02/13/2014: ALT 14; BUN 20; Creatinine 1.0; Hemoglobin 10.6*; Platelets 190.0; Potassium 4.2; Sodium 137    Lipid Panel No results found for: CHOL, TRIG, HDL, CHOLHDL, VLDL, LDLCALC, LDLDIRECT    Wt Readings from Last 3 Encounters:  11/20/14 137 lb (62.143 kg)  07/17/14 135 lb (61.236 kg)  02/13/14 134 lb (60.782 kg)      Other studies Reviewed: Additional studies/ records that were reviewed today include: .  None     ASSESSMENT AND PLAN:  1. Ischemic heart disease status post CABG in 2005, stable 2. combined systolic and diastolic chronic congestive heart failure 3. left bundle branch block 4. varicose veins of legs followed by Dr. Aleda Grana 5. previous unexplained weight loss has now stabilized 6.  Diabetes mellitus doing well on 1 mg glimepiride each morning.  He is not having any hypoglycemic episodes.   Current medicines are reviewed at length with the patient today.  The patient does not have concerns regarding medicines.  The following changes have been made:  no change  Labs/ tests ordered today include:  No orders of the defined types were placed in this encounter.     Disposition:   FU with  in 4 months OV EKG Continue current medication.  I applauded the fact that he is still working at [employer] and enjoying it.  His daughter drive symptoms work and picks him up afterwards.  Signed, Darlin Coco, MD  11/20/2014 5:11 PM    [address], [place], [place]   [address] Phone: [phone]; Fax: [phone]

## 2014-11-20 NOTE — Patient Instructions (Signed)
Your physician recommends that you continue on your current medications as directed. Please refer to the Current Medication list given to you today.  Your physician recommends that you schedule a follow-up appointment in: 4 month ov/ekg 

## 2015-01-31 ENCOUNTER — Other Ambulatory Visit: Payer: Self-pay | Admitting: Cardiology

## 2015-03-16 ENCOUNTER — Emergency Department (INDEPENDENT_AMBULATORY_CARE_PROVIDER_SITE_OTHER): Payer: BLUE CROSS/BLUE SHIELD

## 2015-03-16 ENCOUNTER — Emergency Department (INDEPENDENT_AMBULATORY_CARE_PROVIDER_SITE_OTHER)
Admission: EM | Admit: 2015-03-16 | Discharge: 2015-03-16 | Disposition: A | Discharge status: Home / Self Care | Payer: BLUE CROSS/BLUE SHIELD | Source: Home / Self Care | Attending: Family Medicine | Admitting: Family Medicine

## 2015-03-16 ENCOUNTER — Encounter (HOSPITAL_COMMUNITY): Payer: Self-pay | Admitting: *Deleted

## 2015-03-16 DIAGNOSIS — J189 Pneumonia, unspecified organism: Secondary | ICD-10-CM

## 2015-03-16 DIAGNOSIS — I447 Left bundle-branch block, unspecified: Secondary | ICD-10-CM

## 2015-03-16 MED ORDER — LEVOFLOXACIN 500 MG PO TABS: 500.0000 mg | ORAL_TABLET | Freq: Every day | ORAL | Status: DC

## 2015-03-16 NOTE — ED Provider Notes (Signed)
Dakota [name] is a 81 y.o. male who presents to Urgent Care today for runny nose body aches chest tightness wheezing and shortness of breath. Symptoms present for the last few days. He's used nitroglycerin more than usual last few days. He denies any leg swelling or paroxysmal nocturnal dyspnea. No fevers or chills. No current chest pain or palpitations. He's tried some Tylenol which helps.   Past Medical History  Diagnosis Date  . Smallpox   . Ischemic heart disease   . Diabetes mellitus    Past Surgical History  Procedure Laterality Date  . Hernia repair  1979    bilat repair  . Appendectomy    . Right foot      surgery  . Cardiac catheterization  1992    normal  ([place])  . Coronary artery bypass graft  12/2003  . Doppler echocardiography  07/15/10    EF 40-45%, aortic stenosis   History  Substance Use Topics  . Smoking status: Never Smoker   . Smokeless tobacco: Not on file  . Alcohol Use: No   ROS as above Medications: No current facility-administered medications for this encounter.   Current Outpatient Prescriptions  Medication Sig Dispense Refill  . Acetaminophen (TYLENOL PO) Take by mouth. Taking 650mg  as needed    . aspirin EC 81 MG tablet Take 81 mg by mouth daily.    Sarajane Marek Sodium 30-100 MG CAPS Take 100 mg by mouth daily.      . furosemide (LASIX) 20 MG tablet TAKE 1 TABLET BY MOUTH DAILY 2 TO 3 TIMES A WEEK-NEEDS TO BE SEEN 30 tablet 6  . glimepiride (AMARYL) 2 MG tablet Take 1 mg by mouth daily before breakfast.     . glucose 4 GM chewable tablet Chew 16 g by mouth as needed.      [name] levofloxacin (LEVAQUIN) 500 MG tablet Take 1 tablet (500 mg total) by mouth daily. 7 tablet 0  . losartan (COZAAR) 50 MG tablet TAKE ONE TABLET BY MOUTH ONCE DAILY 90 tablet 0  . meloxicam (MOBIC) 15 MG tablet Take 1 tablet by mouth daily.    . mirabegron ER (MYRBETRIQ) 50 MG TB24 tablet Take 50 mg by mouth daily.    . Multiple Vitamin (MULTIVITAMIN) tablet Take 1  tablet by mouth daily.      . Multiple Vitamins-Minerals (PRESERVISION AREDS 2 PO) Take by mouth.    [name] NITROSTAT 0.4 MG SL tablet Place 0.4 mg under the tongue every 5 (five) minutes as needed.     . ONE TOUCH ULTRA TEST test strip     . pantoprazole (PROTONIX) 40 MG tablet as directed.    . pravastatin (PRAVACHOL) 80 MG tablet Take 80 mg by mouth daily.      [name] PREVNAR 13 SUSP injection Inject 0.5 mLs into the muscle once.     [name] PROSCAR 5 MG tablet Take 5 mg by mouth daily.     [name] ZOSTAVAX 63149 UNT/0.65ML injection Inject 0.65 mLs into the skin once.      Allergies  Allergen Reactions  . Aceon [Perindopril Erbumine] Cough  . Sulfa Antibiotics      Exam:  BP 139/63 mmHg  Pulse 70  Temp(Src) 97.8 F (36.6 C) (Oral)  Resp 26  SpO2 95% Gen: Well NAD HEENT: EOMI,  MMM no JVD Lungs: Normal work of breathing. CTABL Heart: RRR no MRG Abd: NABS, Soft. Nondistended, Nontender Exts: Brisk capillary refill, warm and well perfused. No edema  ED ECG REPORT  Date: 03/16/2015  Rate: 68  Rhythm: sinus arrhythmia  QRS Axis: indeterminate  Intervals: PR prolonged  ST/T Wave abnormalities: indeterminate and LBBB   Conduction Disutrbances:first-degree A-V block  and left bundle branch block  Narrative Interpretation:   Old EKG Reviewed: unchanged  I have personally reviewed the EKG tracing and agree with the computerized printout as noted.    No results found for this or any previous visit (from the past 24 hour(s)). Dg Chest 2 View  03/16/2015   CLINICAL DATA:  Cough, fever, shortness of breath x1 week  EXAM: CHEST  2 VIEW  COMPARISON:  12/16/2011  FINDINGS: Mild left basilar opacity, likely atelectasis. No pleural effusion or pneumothorax.  Cardiomegaly.  Postsurgical changes related to prior CABG.  Degenerative changes of the visualized thoracolumbar spine.  IMPRESSION: Mild left basilar opacity, likely atelectasis.   Electronically Signed   By: Julian Hy M.D.   On: 03/16/2015  13:32    Assessment and Plan: 81 y.o. male with cough congestion. X-ray with opacity atelectasis versus infiltrate. I suspect community-acquired pneumonia. EKG is unchanged. Patient does not have any physical exam findings consistent with heart failure. Treat with Levaquin and follow-up withPCP and cardiology in the near future. Return as needed.  Discussed warning signs or symptoms. Please see discharge instructions. Patient expresses understanding.     Gregor Hams, MD 03/16/15 1350

## 2015-03-16 NOTE — Discharge Instructions (Signed)
Thank you for coming in today. Call or go to the emergency room if you get worse, have trouble breathing, have chest pains, or palpitations.   Pneumonia Pneumonia is an infection of the lungs.  CAUSES Pneumonia may be caused by bacteria or a virus. Usually, these infections are caused by breathing infectious particles into the lungs (respiratory tract). SIGNS AND SYMPTOMS   Cough.  Fever.  Chest pain.  Increased rate of breathing.  Wheezing.  Mucus production. DIAGNOSIS  If you have the common symptoms of pneumonia, your health care provider will typically confirm the diagnosis with a chest X-ray. The X-ray will show an abnormality in the lung (pulmonary infiltrate) if you have pneumonia. Other tests of your blood, urine, or sputum may be done to find the specific cause of your pneumonia. Your health care provider may also do tests (blood gases or pulse oximetry) to see how well your lungs are working. TREATMENT  Some forms of pneumonia may be spread to other people when you cough or sneeze. You may be asked to wear a mask before and during your exam. Pneumonia that is caused by bacteria is treated with antibiotic medicine. Pneumonia that is caused by the influenza virus may be treated with an antiviral medicine. Most other viral infections must run their course. These infections will not respond to antibiotics.  HOME CARE INSTRUCTIONS   Cough suppressants may be used if you are losing too much rest. However, coughing protects you by clearing your lungs. You should avoid using cough suppressants if you can.  Your health care provider may have prescribed medicine if he or she thinks your pneumonia is caused by bacteria or influenza. Finish your medicine even if you start to feel better.  Your health care provider may also prescribe an expectorant. This loosens the mucus to be coughed up.  Take medicines only as directed by your health care provider.  Do not smoke. Smoking is a common  cause of bronchitis and can contribute to pneumonia. If you are a smoker and continue to smoke, your cough may last several weeks after your pneumonia has cleared.  A cold steam vaporizer or humidifier in your room or home may help loosen mucus.  Coughing is often worse at night. Sleeping in a semi-upright position in a recliner or using a couple pillows under your head will help with this.  Get rest as you feel it is needed. Your body will usually let you know when you need to rest. PREVENTION A pneumococcal shot (vaccine) is available to prevent a common bacterial cause of pneumonia. This is usually suggested for:  People over 17 years old.  Patients on chemotherapy.  People with chronic lung problems, such as bronchitis or emphysema.  People with immune system problems. If you are over 65 or have a high risk condition, you may receive the pneumococcal vaccine if you have not received it before. In some countries, a routine influenza vaccine is also recommended. This vaccine can help prevent some cases of pneumonia.You may be offered the influenza vaccine as part of your care. If you smoke, it is time to quit. You may receive instructions on how to stop smoking. Your health care provider can provide medicines and counseling to help you quit. SEEK MEDICAL CARE IF: You have a fever. SEEK IMMEDIATE MEDICAL CARE IF:   Your illness becomes worse. This is especially true if you are elderly or weakened from any other disease.  You cannot control your cough with suppressants and  are losing sleep.  You begin coughing up blood.  You develop pain which is getting worse or is uncontrolled with medicines.  Any of the symptoms which initially brought you in for treatment are getting worse rather than better.  You develop shortness of breath or chest pain. MAKE SURE YOU:   Understand these instructions.  Will watch your condition.  Will get help right away if you are not doing well or get  worse. Document Released: 10/12/2005 Document Revised: 02/26/2014 Document Reviewed: 01/01/2011 [facility] Patient Information 2015 [place], [place]. This information is not intended to replace advice given to you by your health care provider. Make sure you discuss any questions you have with your health care provider.

## 2015-03-16 NOTE — ED Notes (Signed)
Pt  Reports  Symptoms  Of  Cough  /  Congested      Body  Aches            [place]   And  Tightness in  Chest  With   Symptoms     X  4  Days       Pt  Has  A  History   Of heart   Disease    In past  And  He  Reports  He  Has   Used   ntg  2  Days  Ago   He  Is  Awake and  Alert  And  Oriented

## 2015-03-18 ENCOUNTER — Ambulatory Visit: Payer: BLUE CROSS/BLUE SHIELD | Admitting: Cardiology

## 2015-03-19 ENCOUNTER — Ambulatory Visit (INDEPENDENT_AMBULATORY_CARE_PROVIDER_SITE_OTHER): Payer: BLUE CROSS/BLUE SHIELD | Admitting: Cardiology

## 2015-03-19 ENCOUNTER — Encounter: Payer: Self-pay | Admitting: Cardiology

## 2015-03-19 VITALS — BP 134/64 | HR 72 | Ht 66.5 in | Wt 131.0 lb

## 2015-03-19 DIAGNOSIS — I5042 Chronic combined systolic (congestive) and diastolic (congestive) heart failure: Secondary | ICD-10-CM

## 2015-03-19 DIAGNOSIS — I447 Left bundle-branch block, unspecified: Secondary | ICD-10-CM | POA: Diagnosis not present

## 2015-03-19 DIAGNOSIS — Z951 Presence of aortocoronary bypass graft: Secondary | ICD-10-CM | POA: Diagnosis not present

## 2015-03-19 NOTE — Progress Notes (Signed)
Cardiology Office Note   Date:  03/19/2015   ID:  KAYSTON JODOIN, [name] 03/19/1926, MRN [id number]  PCP:  [name], MD  Cardiologist: Darlin Coco MD  No chief complaint on file.     History of Present Illness: Dakota [name] is a 81 y.o. male who presents for a four-month cardiology follow-up.   [name] He has a past history of ischemic heart disease and is status post coronary artery bypass graft surgery. He has combined chronic systolic and diastolic CHF and he has a known left bundle branch block. His echocardiogram done on 12/23/11 showed that his ejection fraction had increased to 50-55%. He does have diastolic dysfunction. His coronary bypass graft surgery was in 2005. Since last visit he has been doing reasonably well. He has had no severe recent chest pain. He remains short of breath with moderate exertion. He has poor balance when he walks and uses a cane. He is still working at [employer]. He enjoys the work. His daughter drives him to work. One week ago the patient developed a cough and some increased shortness of breath and went to urgent care at [facility]  This was on 03/16/15.  He was found to have atelectasis at his left base and he was treated with empiric anti-biotic.  He is on Levaquin 500 mg one daily.  He is feeling better.  He never ran a fever.  His white count was normal.   Past Medical History  Diagnosis Date  . Smallpox   . Ischemic heart disease   . Diabetes mellitus     Past Surgical History  Procedure Laterality Date  . Hernia repair  1979    bilat repair  . Appendectomy    . Right foot      surgery  . Cardiac catheterization  1992    normal  ([place])  . Coronary artery bypass graft  12/2003  . Doppler echocardiography  07/15/10    EF 40-45%, aortic stenosis     Current Outpatient Prescriptions  Medication Sig Dispense Refill  . Acetaminophen (TYLENOL PO) Take 650 mg by mouth daily as needed (for pain). Taking 650mg  as needed    . aspirin EC 81  MG tablet Take 81 mg by mouth daily.    Sarajane Marek Sodium 30-100 MG CAPS Take 100 mg by mouth daily.      . furosemide (LASIX) 20 MG tablet TAKE 1 TABLET BY MOUTH DAILY 2 TO 3 TIMES A WEEK-NEEDS TO BE SEEN 30 tablet 6  . glimepiride (AMARYL) 2 MG tablet Take 1 mg by mouth daily before breakfast.     . glucose 4 GM chewable tablet Chew 16 g by mouth as needed.      [name] levofloxacin (LEVAQUIN) 500 MG tablet Take 1 tablet (500 mg total) by mouth daily. 7 tablet 0  . losartan (COZAAR) 50 MG tablet TAKE ONE TABLET BY MOUTH ONCE DAILY 90 tablet 0  . meloxicam (MOBIC) 15 MG tablet Take 1 tablet by mouth daily.    . mirabegron ER (MYRBETRIQ) 50 MG TB24 tablet Take 50 mg by mouth daily.    . Multiple Vitamin (MULTIVITAMIN) tablet Take 1 tablet by mouth daily.      . Multiple Vitamins-Minerals (PRESERVISION AREDS 2 PO) Take by mouth.    [name] NITROSTAT 0.4 MG SL tablet Place 0.4 mg under the tongue every 5 (five) minutes as needed (for chest pain).     . ONE TOUCH ULTRA TEST test strip     .  pantoprazole (PROTONIX) 40 MG tablet Take 40 mg by mouth daily.     . pravastatin (PRAVACHOL) 80 MG tablet Take 80 mg by mouth daily.      [name] PREVNAR 13 SUSP injection Inject 0.5 mLs into the muscle once.     [name] PROSCAR 5 MG tablet Take 5 mg by mouth daily.     [name] ZOSTAVAX 71245 UNT/0.65ML injection Inject 0.65 mLs into the skin once.      No current facility-administered medications for this visit.    Allergies:   Aceon and Sulfa antibiotics    Social History:  The patient  reports that he has never smoked. He does not have any smokeless tobacco history on file. He reports that he does not drink alcohol.   Family History:  The patient's family history includes Stroke in his mother; Ulcers in his mother.    ROS:  Please see the history of present illness.   Otherwise, review of systems are positive for none.   All other systems are reviewed and negative.    PHYSICAL EXAM: VS:  BP 134/64 mmHg  Pulse  72  Ht 5' 6.5" (1.689 m)  Wt 131 lb (59.421 kg)  BMI 20.83 kg/m2 , BMI Body mass index is 20.83 kg/(m^2). GEN: Well nourished, well developed, in no acute distress HEENT: normal Neck: no JVD, carotid bruits, or masses Cardiac: RRR; no murmurs, rubs, or gallops,no edema  Respiratory:  clear to auscultation bilaterally, normal work of breathing GI: soft, nontender, nondistended, + BS MS: no deformity or atrophy Skin: warm and dry, no rash Neuro:  Strength and sensation are intact Psych: euthymic mood, full affect   EKG:  EKG is ordered today. The ekg ordered today demonstrates normal sinus rhythm with marked sinus arrhythmia and first-degree AV block and left bundle branch block with right axis deviation.   Recent Labs: No results found for requested labs within last 365 days.    Lipid Panel No results found for: CHOL, TRIG, HDL, CHOLHDL, VLDL, LDLCALC, LDLDIRECT    Wt Readings from Last 3 Encounters:  03/19/15 131 lb (59.421 kg)  11/20/14 137 lb (62.143 kg)  07/17/14 135 lb (61.236 kg)      Other studies Reviewed: Additional studies/ records that were reviewed today include: Records from emergency room urgent care visit on 03/16/15. Review of the above records demonstrates: Normal white count.  Chest x-ray showed atelectasis or infiltrate at left base   ASSESSMENT AND PLAN:  1. Ischemic heart disease status post CABG in 2005, stable 2. combined systolic and diastolic chronic congestive heart failure 3. left bundle branch block 4. varicose veins of legs followed by Dr. Aleda Grana 5.  Weight loss of 6 pounds since January 2016 6. Diabetes mellitus doing well on 1 mg glimepiride each morning.   Current medicines are reviewed at length with the patient today.  The patient does not have concerns regarding medicines.  The following changes have been made:  no change  Labs/ tests ordered today include:   Orders Placed This Encounter  Procedures  . EKG 12-Lead    Plan: Continue current medication.  Recheck in 4 months for office visit  Signed, Darlin Coco MD 03/19/2015 5:14 PM    [facility] [address], [place], [place]  [address] Phone: [phone]; Fax: [phone]

## 2015-03-19 NOTE — Patient Instructions (Signed)
Medication Instructions:  Your physician recommends that you continue on your current medications as directed. Please refer to the Current Medication list given to you today.  Labwork: none  Testing/Procedures: none  Follow-Up: Your physician recommends that you schedule a follow-up appointment in: 4 month ov   

## 2015-04-08 ENCOUNTER — Telehealth: Payer: Self-pay | Admitting: Cardiology

## 2015-04-08 NOTE — Telephone Encounter (Signed)
Agree with advice given

## 2015-04-08 NOTE — Telephone Encounter (Signed)
New message      Daughter states dad has been having chest pain this am and is sob.  He has taken several nitro.  She is calling from her cell phone and is on her way to his home.  Please call daughter

## 2015-04-08 NOTE — Telephone Encounter (Signed)
Spoke with daughter and she states that pt has been having CP and SOB since last night. Daughter states pt took a Nitro last night and one today that she is aware of. Daughter states that pain is not relieved by Nitro and that pt is still grabbing at chest and having SOB. Informed daughter to hang up with me and call 911 that pt really needs to go to ED. Daughter verbalized understanding and was in agreement with this plan.

## 2015-04-22 ENCOUNTER — Other Ambulatory Visit: Payer: Self-pay

## 2015-05-07 ENCOUNTER — Telehealth: Payer: Self-pay | Admitting: Cardiology

## 2015-05-07 NOTE — Telephone Encounter (Signed)
Spoke with patient son-in-law and patient was seen by PCP today PCP found patients O2 sats to be low, came up on oxygen Patient sent home on home oxygen, lasix increased to full tablet until seen by  Dr. Mare Ferrari  Was also told to see cardiologist this week for follow up Discussed with  Dr. Mare Ferrari and work in with [employer] [facility] Thursday

## 2015-05-07 NOTE — Telephone Encounter (Signed)
New Message  Congestion around the chest oxygen is very low and the PCP indicates that the heart condition is getting worse.

## 2015-05-07 NOTE — Telephone Encounter (Signed)
Appointment date and time give to son-in-law

## 2015-05-08 NOTE — Progress Notes (Signed)
Cardiology Office Note   Date:  05/09/2015   ID:  Dakota [name], DOB April 06, 1926, MRN [id number]  PCP:  [name], MD  Cardiologist:  Dr. Darlin Coco     Chief Complaint  Patient presents with  . Chest Pain     History of Present Illness: Dakota [name] is a 81 y.o. male with a hx of CAD s/p CABG, combined systolic and diastolic CHF, LBBB, DM2.  Last echo in 2013 demonstrated EF 81-82%, mod diastolic dysfunction.  Last seen by Dr. Darlin Coco 02/2015.    Patient has recently called in with complaints of chest pain and shortness of breath. His primary care provider apparently documented hypoxia and placed him on oxygen.  He returns for further evaluation. He is here today with his son-in-law. The patient went to urgent care in late May for congestion and cough. There was suspected pneumonia on chest x-ray and he was placed on antibiotics. His cough is improved. However, he continues to have dyspnea as well as chest tightness. He does have some chest tightness with lying flat. He also notes tightness in his chest with exertion. He probably describes CCS class 2-3 symptoms. He sometimes takes nitroglycerin 1 with relief. He sleeps on 2 pillows chronically. He denies PND or edema. He denies increased abdominal girth. He denies weight gain or syncope.  He tells his primary care physician increased his Lasix. This occurred 2 days ago. He feels somewhat better since that time. I received notes from his primary care physician while the patient was in the office. His oxygen dropped to 85% on room air. He was set up with home O2. Of note, he has not had his oxygen on since 11:30 this morning. Oxygen in our office at 2 PM demonstrates an O2 of 95% on room air.  The patient's son-in-law tells me that he had a chest x-ray. Records from primary care do not indicate that he had a chest x-ray.  Labs at primary care 05/07/15: Creatinine 0.8, potassium 5.1, ALT 13, Hgb 12.1, TSH  0.94   Studies/Reports Reviewed Today:  Echo 11/2011 - Left ventricle: Mild LVH. EF 50% to 55%. Wall motion was normal.  Grade 2 diastolicdysfunction. - Mitral valve: Calcified annulus. - Left atrium: The atrium was mildly dilated. - Atrial septum: There was an atrial septal aneurysm. Impressions:  - EF difficult to quantitate but appears to be low normal tomildy reduced.  Myoview 12/2009 Normal EF 57%  LHC 12/2003 CORONARY ANGIOGRAPHY: LM:  distal 50-60% stenosis. LAD:  proximal 60 and 70% and there is a 70-75% stenosis in the mid vessel. Distal 40%.D1 mid 70-80% LCx:  ostial 90%. OM1 is a very large and branching vessel with 95-99% stenosis right at the branch point of these two large obtuse marginal branches. RCA:  Distal 60-70% EF 65%. >> CABG   Past Medical History  Diagnosis Date  . Smallpox   . Ischemic heart disease   . Diabetes mellitus     Past Surgical History  Procedure Laterality Date  . Hernia repair  1979    bilat repair  . Appendectomy    . Right foot      surgery  . Cardiac catheterization  1992    normal  ([place])  . Coronary artery bypass graft  12/2003  . Doppler echocardiography  07/15/10    EF 40-45%, aortic stenosis     Current Outpatient Prescriptions  Medication Sig Dispense Refill  . Acetaminophen (TYLENOL PO) Take 500 mg by  mouth daily as needed (for pain). Taking 650mg  as needed    . aspirin EC 81 MG tablet Take 81 mg by mouth daily.    Sarajane Marek Sodium 30-100 MG CAPS Take 100 mg by mouth daily.      . furosemide (LASIX) 20 MG tablet Take 40 mg by mouth daily.    [name] glimepiride (AMARYL) 2 MG tablet Take 1 mg by mouth daily before breakfast.     . glucose 4 GM chewable tablet Chew 16 g by mouth as needed.      [name] losartan (COZAAR) 50 MG tablet TAKE ONE TABLET BY MOUTH ONCE DAILY 90 tablet 0  . mirabegron ER (MYRBETRIQ) 50 MG TB24 tablet Take 50 mg by mouth daily.    . Multiple Vitamin (MULTIVITAMIN) tablet Take 1 tablet  by mouth daily.      . Multiple Vitamins-Minerals (PRESERVISION AREDS 2 PO) Take by mouth.    [name] NITROSTAT 0.4 MG SL tablet Place 0.4 mg under the tongue every 5 (five) minutes as needed (for chest pain).     . ONE TOUCH ULTRA TEST test strip     . pantoprazole (PROTONIX) 40 MG tablet Take 40 mg by mouth daily.     . pravastatin (PRAVACHOL) 80 MG tablet Take 80 mg by mouth daily.      [name] PROSCAR 5 MG tablet Take 5 mg by mouth daily.      No current facility-administered medications for this visit.    Allergies:   Sulfa antibiotics and Aceon    Social History:  The patient  reports that he has never smoked. He does not have any smokeless tobacco history on file. He reports that he does not drink alcohol.   Family History:  The patient's family history includes Stroke in his mother; Ulcers in his mother.    ROS:   Please see the history of present illness.   Review of Systems  HENT: Positive for hearing loss.   Cardiovascular: Positive for chest pain, dyspnea on exertion and orthopnea.  Respiratory: Positive for snoring and wheezing.   Musculoskeletal: Positive for back pain.  Gastrointestinal: Positive for constipation.  Neurological: Positive for dizziness.  All other systems reviewed and are negative.     PHYSICAL EXAM: VS:  BP 128/54 mmHg  Pulse 60  Ht 5' 6.5" (1.689 m)  Wt 130 lb (58.968 kg)  BMI 20.67 kg/m2  SpO2 95%    Wt Readings from Last 3 Encounters:  05/09/15 130 lb (58.968 kg)  03/19/15 131 lb (59.421 kg)  11/20/14 137 lb (62.143 kg)     GEN: Well nourished, well developed, in no acute distress HEENT: normal Neck: no JVD,   no masses Cardiac:  Normal S1/S2, RRR; no murmur ,  no rubs or gallops, no edema   Respiratory:  clear to auscultation bilaterally, no wheezing, rhonchi or rales. GI: soft, nontender, nondistended, + BS MS: no deformity or atrophy Skin: warm and dry  Neuro:  CNs II-XII intact, Strength and sensation are intact Psych: Normal  affect   EKG:  EKG is ordered today.  It demonstrates:   NSR, HR 70, LBBB   Recent Labs: No results found for requested labs within last 365 days.    Lipid Panel No results found for: CHOL, TRIG, HDL, CHOLHDL, VLDL, LDLCALC, LDLDIRECT    ASSESSMENT AND PLAN:   1.  Chest Pain: His symptoms are somewhat concerning for exertional angina. However, he has had some congestion and shortness of breath. Some of  the symptoms do sound consistent with volume excess. He has noted some improvement in his symptoms since starting on a higher dose of Lasix. Of note, he did have some hypoxia a couple of days ago at his primary care physician's office. On higher dose Lasix, this is improved and he is oxygenating normally on room air today. It has been 5 years since his last assessment for ischemia. Echocardiogram in 2013 demonstrated normal LV function with moderate diastolic dysfunction. I reviewed his case today with Dr. Mare Ferrari. We have recommended proceeding with a Lexiscan Myoview to rule out ischemia. If he has any ischemia on his Myoview, will consider medical therapy initially. If he has high risk findings, we may need to consider cardiac catheterization. The patient will continue his higher dose of Lasix for now. We will obtain follow-up BMET and BNP in 1 week. He will be brought back in close follow-up in the next 2 weeks. 2.  Coronary Artery Disease: Proceed with Myoview as noted given exertional anginal type symptoms. Continue when necessary nitroglycerin. Consider the addition of isosorbide. This will be based upon the findings of his Myoview. Continue aspirin, statin. 3.  Chronic Combined Systolic and Diastolic CHF:  Overall, volume appears to be stable. Lung exam is normal. As noted, his oxygenation has improved on higher dose Lasix. We will continue him on his higher dose of Lasix for now and recheck BMET and BNP in 1 week. 4.  Hyperlipidemia: Continue statin.  5.  Hypertension:  Controlled.      Medication Changes: Current medicines are reviewed at length with the patient today.  Concerns regarding medicines are as outlined above.  The following changes have been made:   Discontinued Medications   FUROSEMIDE (LASIX) 20 MG TABLET    TAKE 1 TABLET BY MOUTH DAILY 2 TO 3 TIMES A WEEK-NEEDS TO BE SEEN   LEVOFLOXACIN (LEVAQUIN) 500 MG TABLET    Take 1 tablet (500 mg total) by mouth daily.   MELOXICAM (MOBIC) 15 MG TABLET    Take 1 tablet by mouth daily.   PREVNAR 13 SUSP INJECTION    Inject 0.5 mLs into the muscle once.    ZOSTAVAX 50093 UNT/0.65ML INJECTION    Inject 0.65 mLs into the skin once.    Modified Medications   No medications on file   New Prescriptions   No medications on file     Labs/ tests ordered today include:   Orders Placed This Encounter  Procedures  . B Nat Peptide  . Basic metabolic panel  . Myocardial Perfusion Imaging  . EKG 12-Lead     Disposition:   FU with me or Dr. Darlin Coco in 2 weeks. Keep appt with Dr. Darlin Coco in September as planned.    Signed, Versie Starks, MHS 05/09/2015 4:14 PM    [facility] [address], [place], [place]  [address] Phone: [phone]; Fax: [phone]

## 2015-05-09 ENCOUNTER — Encounter: Payer: Self-pay | Admitting: Physician Assistant

## 2015-05-09 ENCOUNTER — Ambulatory Visit (INDEPENDENT_AMBULATORY_CARE_PROVIDER_SITE_OTHER): Payer: BLUE CROSS/BLUE SHIELD | Admitting: Physician Assistant

## 2015-05-09 VITALS — BP 128/54 | HR 60 | Ht 66.5 in | Wt 130.0 lb

## 2015-05-09 DIAGNOSIS — I10 Essential (primary) hypertension: Secondary | ICD-10-CM

## 2015-05-09 DIAGNOSIS — R079 Chest pain, unspecified: Secondary | ICD-10-CM

## 2015-05-09 DIAGNOSIS — I2510 Atherosclerotic heart disease of native coronary artery without angina pectoris: Secondary | ICD-10-CM

## 2015-05-09 DIAGNOSIS — I5042 Chronic combined systolic (congestive) and diastolic (congestive) heart failure: Secondary | ICD-10-CM

## 2015-05-09 DIAGNOSIS — E785 Hyperlipidemia, unspecified: Secondary | ICD-10-CM

## 2015-05-09 NOTE — Patient Instructions (Addendum)
Medication Instructions:  Your physician recommends that you continue on your current medications as directed. Please refer to the Current Medication list given to you today. Continue increased dosage of Lasix  Labwork: Your physician recommends that you return for lab work in: 1 week (Bmet,Bnp)  Testing/Procedures: Your physician has requested that you have a lexiscan myoview. For further information please visit [email or web address]. Please follow instruction sheet, as given.   Follow-Up: Your physician recommends that you schedule a follow-up appointment in: 2 weeks with Dr.Brackbill/ or Margaret Pyle a day that Dr.Brackbill is in the office   Any Other Special Instructions Will Be Listed Below (If Applicable). We will request copies of your recent lab work and office visit with Dr.Tisovec

## 2015-05-10 ENCOUNTER — Telehealth (HOSPITAL_COMMUNITY): Payer: Self-pay

## 2015-05-10 NOTE — Telephone Encounter (Signed)
Encounter complete. 

## 2015-05-14 ENCOUNTER — Telehealth (HOSPITAL_COMMUNITY): Payer: Self-pay

## 2015-05-14 ENCOUNTER — Other Ambulatory Visit (INDEPENDENT_AMBULATORY_CARE_PROVIDER_SITE_OTHER): Payer: BLUE CROSS/BLUE SHIELD

## 2015-05-14 DIAGNOSIS — I5042 Chronic combined systolic (congestive) and diastolic (congestive) heart failure: Secondary | ICD-10-CM

## 2015-05-14 LAB — BRAIN NATRIURETIC PEPTIDE: Pro B Natriuretic peptide (BNP): 260 pg/mL — ABNORMAL HIGH (ref 0.0–100.0)

## 2015-05-14 LAB — BASIC METABOLIC PANEL
BUN: 23 mg/dL (ref 6–23)
CALCIUM: 8.9 mg/dL (ref 8.4–10.5)
CO2: 30 mEq/L (ref 19–32)
Chloride: 100 mEq/L (ref 96–112)
Creatinine, Ser: 0.77 mg/dL (ref 0.40–1.50)
GFR: 101.51 mL/min (ref >60.00)
Glucose, Bld: 105 mg/dL — ABNORMAL HIGH (ref 70–99)
POTASSIUM: 4 meq/L (ref 3.5–5.1)
Sodium: 136 mEq/L (ref 135–145)

## 2015-05-14 NOTE — Telephone Encounter (Signed)
Encounter complete. 

## 2015-05-15 ENCOUNTER — Ambulatory Visit (HOSPITAL_COMMUNITY)
Admission: RE | Admit: 2015-05-15 | Discharge: 2015-05-15 | Disposition: A | Discharge status: Home / Self Care | Payer: BLUE CROSS/BLUE SHIELD | Source: Ambulatory Visit | Attending: Cardiovascular Disease | Admitting: Cardiovascular Disease

## 2015-05-15 DIAGNOSIS — R079 Chest pain, unspecified: Secondary | ICD-10-CM | POA: Diagnosis not present

## 2015-05-15 DIAGNOSIS — R9439 Abnormal result of other cardiovascular function study: Secondary | ICD-10-CM | POA: Diagnosis not present

## 2015-05-15 LAB — MYOCARDIAL PERFUSION IMAGING
CHL CUP NUCLEAR SDS: 0
CHL CUP RESTING HR STRESS: 58 {beats}/min
CSEPPHR: 89 {beats}/min
LV dias vol: 101 mL
LV sys vol: 49 mL
SRS: 10
SSS: 10
TID: 0.99

## 2015-05-15 MED ORDER — AMINOPHYLLINE 25 MG/ML IV SOLN
75.0000 mg | Freq: Once | INTRAVENOUS | Status: AC
  Administered 2015-05-15: 75 mg via INTRAVENOUS

## 2015-05-15 MED ORDER — REGADENOSON 0.4 MG/5ML IV SOLN
0.4000 mg | Freq: Once | INTRAVENOUS | Status: AC
  Administered 2015-05-15: 0.4 mg via INTRAVENOUS

## 2015-05-15 MED ORDER — TECHNETIUM TC 99M SESTAMIBI GENERIC - CARDIOLITE
10.8000 | Freq: Once | INTRAVENOUS | Status: AC | PRN
  Administered 2015-05-15: 11 via INTRAVENOUS

## 2015-05-15 MED ORDER — TECHNETIUM TC 99M SESTAMIBI GENERIC - CARDIOLITE
30.6000 | Freq: Once | INTRAVENOUS | Status: AC | PRN
  Administered 2015-05-15: 31 via INTRAVENOUS

## 2015-05-16 ENCOUNTER — Encounter: Payer: Self-pay | Admitting: Physician Assistant

## 2015-05-17 ENCOUNTER — Telehealth: Payer: Self-pay | Admitting: *Deleted

## 2015-05-17 NOTE — Telephone Encounter (Signed)
Due to language barrier s/w pt's son about myoview results. Pt has appt 7/26 w/Scott W. PA. Son advised will discuss about possible cardiac cath. Son verbalized understanding to plan of care.

## 2015-05-20 NOTE — Progress Notes (Signed)
Cardiology Office Note   Date:  05/21/2015   ID:  Dakota [name], DOB Apr 07, 1926, MRN [id number]  PCP:  [name], MD  Cardiologist:  Dr. Darlin Coco     Chief Complaint  Patient presents with  . Labs Only     History of Present Illness: Dakota [name] is a 81 y.o. male with a hx of CAD s/p CABG, combined systolic and diastolic CHF, LBBB, DM2.  Last echo in 2013 demonstrated EF 86-76%, mod diastolic dysfunction.  Last seen by Dr. Darlin Coco 02/2015.    I saw him 7/14 with complaints of chest pain and shortness of breath. His primary care provider had recently seen him and documented hypoxia and placed him on oxygen.  He was treated with antibiotics for suspected pneumonia.  When I saw him, his cough had improved but he complained of chest pain with exertion (CCS class 2-3 symptoms) with relief with NTG.  I set him up for a [employer].  This was abnormal with suggestion of inferior and inferolateral ischemia. He is brought back today to discuss arranging a cardiac cath.  He returns today with his son. He did have another episode of chest pain last night. He was just walking to the bathroom.  He had to take 2 NTG with relief.  He notes dyspnea with mild to moderate activities.  Denies syncope.  No orthopnea, PND, edema.  Cough is persistent but improving.     Studies/Reports Reviewed Today:  Myoview 05/15/15  The left ventricular ejection fraction is mildly decreased (45-54%).  Nuclear stress EF: 51%.  Defect 1: There is a medium defect of moderate severity present in the basal inferior, basal inferolateral, mid inferior and mid inferolateral location. Intermediate risk study with moderate inferolateral wall ischemia. The patient will follow up with Richardson Dopp PA-C. Further evaluation may be warrented.  Echo 11/2011 - Left ventricle: Mild LVH. EF 50% to 55%. Wall motion was normal.  Grade 2 diastolicdysfunction. - Mitral valve: Calcified annulus. - Left  atrium: The atrium was mildly dilated. - Atrial septum: There was an atrial septal aneurysm. Impressions:  - EF difficult to quantitate but appears to be low normal tomildy reduced.  Myoview 12/2009 Normal EF 57%  LHC 12/2003 CORONARY ANGIOGRAPHY: LM:  distal 50-60% stenosis. LAD:  proximal 60 and 70% and there is a 70-75% stenosis in the mid vessel. Distal 40%.D1 mid 70-80% LCx:  ostial 90%. OM1 is a very large and branching vessel with 95-99% stenosis right at the branch point of these two large obtuse marginal branches. RCA:  Distal 60-70% EF 65%. >> CABG   Past Medical History  Diagnosis Date  . Smallpox   . Ischemic heart disease   . Diabetes mellitus   . History of cardiovascular stress test     Lexiscan Myoview 7/16:  EF 51%, inferior, inferolateral ischemia; Intermediate Risk    Past Surgical History  Procedure Laterality Date  . Hernia repair  1979    bilat repair  . Appendectomy    . Right foot      surgery  . Cardiac catheterization  1992    normal  ([place])  . Coronary artery bypass graft  12/2003  . Doppler echocardiography  07/15/10    EF 40-45%, aortic stenosis     Current Outpatient Prescriptions  Medication Sig Dispense Refill  . Acetaminophen (TYLENOL PO) Take 500 mg by mouth daily as needed (for pain). Taking 650mg  as needed    . aspirin EC 81  MG tablet Take 81 mg by mouth daily.    Sarajane Marek Sodium 30-100 MG CAPS Take 100 mg by mouth daily.      . furosemide (LASIX) 40 MG tablet Take 1 tablet (40 mg total) by mouth daily. 90 tablet 3  . glimepiride (AMARYL) 2 MG tablet Take 1 mg by mouth daily before breakfast.     . glucose 4 GM chewable tablet Chew 16 g by mouth as needed.      [name] losartan (COZAAR) 50 MG tablet TAKE ONE TABLET BY MOUTH ONCE DAILY 90 tablet 0  . mirabegron ER (MYRBETRIQ) 50 MG TB24 tablet Take 50 mg by mouth daily.    . Multiple Vitamin (MULTIVITAMIN) tablet Take 1 tablet by mouth daily.      . Multiple  Vitamins-Minerals (PRESERVISION AREDS 2 PO) Take by mouth.    [name] NITROSTAT 0.4 MG SL tablet Place 0.4 mg under the tongue every 5 (five) minutes as needed (for chest pain).     . ONE TOUCH ULTRA TEST test strip     . pantoprazole (PROTONIX) 40 MG tablet Take 1 tablet (40 mg total) by mouth daily. 90 tablet 3  . pravastatin (PRAVACHOL) 80 MG tablet Take 80 mg by mouth daily.      [name] PROSCAR 5 MG tablet Take 5 mg by mouth daily.      No current facility-administered medications for this visit.    Allergies:   Sulfa antibiotics and Aceon    Social History:  The patient  reports that he has never smoked. He does not have any smokeless tobacco history on file. He reports that he does not drink alcohol.   Family History:  The patient's family history includes Stroke in his mother; Ulcers in his mother.    ROS:   Please see the history of present illness.   Review of Systems  HENT: Positive for hearing loss.   Cardiovascular: Positive for chest pain, dyspnea on exertion and orthopnea.  Respiratory: Positive for snoring.   Musculoskeletal: Positive for back pain.  Gastrointestinal: Positive for constipation.  Neurological: Positive for dizziness.  All other systems reviewed and are negative.     PHYSICAL EXAM: VS:  BP 120/60 mmHg  Pulse 68  Ht 5\' 6"  (1.676 m)  Wt 135 lb (61.236 kg)  BMI 21.80 kg/m2    Wt Readings from Last 3 Encounters:  05/21/15 135 lb (61.236 kg)  05/09/15 130 lb (58.968 kg)  03/19/15 131 lb (59.421 kg)     GEN: Well nourished, well developed, in no acute distress HEENT: normal Neck: no JVD,   no masses Cardiac:  Normal S1/S2, RRR; no murmur ,  no rubs or gallops, no edema   Respiratory:  clear to auscultation bilaterally, no wheezing, rhonchi or rales. GI: soft, nontender, nondistended, + BS MS: no deformity or atrophy Skin: warm and dry  Neuro:  CNs II-XII intact, Strength and sensation are intact Psych: Normal affect   EKG:  EKG is ordered today.   It demonstrates:   NSR, HR 62, LBBB   Recent Labs: 05/14/2015: BUN 23; Creatinine, Ser 0.77; Potassium 4.0; Pro B Natriuretic peptide (BNP) 260.0*; Sodium 136    Lipid Panel No results found for: CHOL, TRIG, HDL, CHOLHDL, VLDL, LDLCALC, LDLDIRECT    ASSESSMENT AND PLAN:   1.  Chest Pain:  Recent myoview intermediate risk with suggestion of inferior and inferolateral ischemia.  Symptoms sound c/w CCS Class 3 angina.  I reviewed this with Dr. Darlin Coco and  we brought the patient back today to arrange cardiac cath.  Risks and benefits of cardiac catheterization have been discussed with the patient.  These include bleeding, infection, kidney damage, stroke, heart attack, death.  The patient understands these risks and is willing to proceed.  We tried to schedule him tomorrow but the schedule is full. Will proceed with LHC on Thursday 7/28.  If he has recurrent pain, he will need to go to the ED.    2.  Coronary Artery Disease:  Proceed with cardiac cath as noted.  Continue aspirin, statin.  3.  Chronic Combined Systolic and Diastolic CHF:   Volume appears stable.    4.  Hyperlipidemia: Continue statin.   5.  Hypertension:  Controlled.     Medication Changes: Current medicines are reviewed at length with the patient today.  Concerns regarding medicines are as outlined above.  The following changes have been made:   Discontinued Medications   No medications on file   Modified Medications   Modified Medication Previous Medication   FUROSEMIDE (LASIX) 40 MG TABLET furosemide (LASIX) 20 MG tablet      Take 1 tablet (40 mg total) by mouth daily.    Take 40 mg by mouth daily.   PANTOPRAZOLE (PROTONIX) 40 MG TABLET pantoprazole (PROTONIX) 40 MG tablet      Take 1 tablet (40 mg total) by mouth daily.    Take 40 mg by mouth daily.    New Prescriptions   No medications on file    Labs/ tests ordered today include:   Orders Placed This Encounter  Procedures  . Basic Metabolic Panel  (BMET)  . CBC w/Diff  . Protime-INR  . PTT  . EKG 12-Lead     Disposition:   FU me or Dr. Darlin Coco 2 weeks after [facility].     Signed, Versie Starks, MHS 05/21/2015 4:26 PM    [facility] [address], [place], [place]  [address] Phone: [phone]; Fax: [phone]

## 2015-05-21 ENCOUNTER — Encounter: Payer: Self-pay | Admitting: Physician Assistant

## 2015-05-21 ENCOUNTER — Encounter: Payer: Self-pay | Admitting: *Deleted

## 2015-05-21 ENCOUNTER — Ambulatory Visit (INDEPENDENT_AMBULATORY_CARE_PROVIDER_SITE_OTHER): Payer: BLUE CROSS/BLUE SHIELD | Admitting: Physician Assistant

## 2015-05-21 VITALS — BP 120/60 | HR 68 | Ht 66.0 in | Wt 135.0 lb

## 2015-05-21 DIAGNOSIS — I25119 Atherosclerotic heart disease of native coronary artery with unspecified angina pectoris: Secondary | ICD-10-CM | POA: Diagnosis not present

## 2015-05-21 DIAGNOSIS — I10 Essential (primary) hypertension: Secondary | ICD-10-CM

## 2015-05-21 DIAGNOSIS — I5042 Chronic combined systolic (congestive) and diastolic (congestive) heart failure: Secondary | ICD-10-CM

## 2015-05-21 DIAGNOSIS — E785 Hyperlipidemia, unspecified: Secondary | ICD-10-CM

## 2015-05-21 LAB — BASIC METABOLIC PANEL
BUN: 17 mg/dL (ref 6–23)
CO2: 30 mEq/L (ref 19–32)
Calcium: 8.8 mg/dL (ref 8.4–10.5)
Chloride: 103 mEq/L (ref 96–112)
Creatinine, Ser: 0.75 mg/dL (ref 0.40–1.50)
GFR: 104.63 mL/min (ref >60.00)
Glucose, Bld: 153 mg/dL — ABNORMAL HIGH (ref 70–99)
Potassium: 4.4 mEq/L (ref 3.5–5.1)
Sodium: 137 mEq/L (ref 135–145)

## 2015-05-21 LAB — CBC WITH DIFFERENTIAL/PLATELET
BASOS PCT: 0 % (ref 0.0–3.0)
Basophils Absolute: 0 10*3/uL (ref 0.0–0.1)
Eosinophils Absolute: 0.2 10*3/uL (ref 0.0–0.7)
Eosinophils Relative: 2.5 % (ref 0.0–5.0)
HEMATOCRIT: 33.4 % — AB (ref 39.0–52.0)
HEMOGLOBIN: 10.6 g/dL — AB (ref 13.0–17.0)
LYMPHS PCT: 30.3 % (ref 12.0–46.0)
Lymphs Abs: 2.1 10*3/uL (ref 0.7–4.0)
MCHC: 31.7 g/dL (ref 30.0–36.0)
MCV: 60.2 fl — ABNORMAL LOW (ref 78.0–100.0)
Monocytes Absolute: 0.6 10*3/uL (ref 0.1–1.0)
Monocytes Relative: 8.2 % (ref 3.0–12.0)
NEUTROS PCT: 59 % (ref 43.0–77.0)
Neutro Abs: 4.1 10*3/uL (ref 1.4–7.7)
PLATELETS: 176 10*3/uL (ref 150.0–400.0)
RBC: 5.56 Mil/uL (ref 4.22–5.81)
RDW: 15.9 % — AB (ref 11.5–15.5)
WBC: 7 10*3/uL (ref 4.0–10.5)

## 2015-05-21 LAB — PROTIME-INR
INR: 1.1 ratio — ABNORMAL HIGH (ref 0.8–1.0)
Prothrombin Time: 11.8 s (ref 9.6–13.1)

## 2015-05-21 MED ORDER — PANTOPRAZOLE SODIUM 40 MG PO TBEC: 40.0000 mg | DELAYED_RELEASE_TABLET | Freq: Every day | ORAL | Status: DC

## 2015-05-21 MED ORDER — FUROSEMIDE 40 MG PO TABS: 40.0000 mg | ORAL_TABLET | Freq: Every day | ORAL | Status: DC

## 2015-05-21 MED ORDER — PANTOPRAZOLE SODIUM 40 MG PO TBEC: 40.0000 mg | DELAYED_RELEASE_TABLET | Freq: Every day | ORAL | Status: AC

## 2015-05-21 NOTE — Patient Instructions (Signed)
Medication Instructions:  Refilled Lasix ( 40 mg ) daily and Protonix ( 40 mg ) daily to patient's requested pharmacy  Labwork: Your physician recommends that you have lab work: cbc/bmet/pt/inr/ptt   Testing/Procedures: You are scheduled for a cardiac catheterization on Thursday, July 28 with Dr. [name] or associate.  Go to [facility] 2nd Floor Short Stay on July 28  at 5:30 am.  Enter thru the [place] entrance A No food or drink after midnight on Wednesday, July 27. You may take your medications with a sip of water on the day of your procedure except hold glimepiride and lasix the am of catherization.   Follow-Up: Your physician recommends that you keep your scheduled follow-up appointment with Richardson Dopp, PA-C.   Any Other Special Instructions Will Be Listed Below (If Applicable).

## 2015-05-22 ENCOUNTER — Telehealth: Payer: Self-pay | Admitting: *Deleted

## 2015-05-22 LAB — APTT: APTT: 30.3 s (ref 23.4–32.7)

## 2015-05-22 NOTE — Telephone Encounter (Signed)
Son returned call and was giving pt's lab results that they were ok and ok for pt to have cath.  due to language barrier, spoke to pt son.Reola Mosher 05-22-15

## 2015-05-23 ENCOUNTER — Encounter (HOSPITAL_COMMUNITY): Payer: Self-pay | Admitting: Cardiovascular Disease

## 2015-05-23 ENCOUNTER — Ambulatory Visit (HOSPITAL_COMMUNITY)
Admission: RE | Admit: 2015-05-23 | Discharge: 2015-05-25 | Disposition: A | Discharge status: Home / Self Care | Payer: BLUE CROSS/BLUE SHIELD | Source: Ambulatory Visit | Attending: Cardiovascular Disease | Admitting: Cardiovascular Disease

## 2015-05-23 ENCOUNTER — Encounter (HOSPITAL_COMMUNITY)
Admission: RE | Disposition: A | Discharge status: Home / Self Care | Payer: BLUE CROSS/BLUE SHIELD | Source: Ambulatory Visit | Attending: Cardiovascular Disease

## 2015-05-23 DIAGNOSIS — I25110 Atherosclerotic heart disease of native coronary artery with unstable angina pectoris: Secondary | ICD-10-CM | POA: Insufficient documentation

## 2015-05-23 DIAGNOSIS — I10 Essential (primary) hypertension: Secondary | ICD-10-CM | POA: Diagnosis not present

## 2015-05-23 DIAGNOSIS — I2582 Chronic total occlusion of coronary artery: Secondary | ICD-10-CM | POA: Diagnosis not present

## 2015-05-23 DIAGNOSIS — I25710 Atherosclerosis of autologous vein coronary artery bypass graft(s) with unstable angina pectoris: Secondary | ICD-10-CM | POA: Insufficient documentation

## 2015-05-23 DIAGNOSIS — I5042 Chronic combined systolic (congestive) and diastolic (congestive) heart failure: Secondary | ICD-10-CM | POA: Diagnosis not present

## 2015-05-23 DIAGNOSIS — I25119 Atherosclerotic heart disease of native coronary artery with unspecified angina pectoris: Secondary | ICD-10-CM | POA: Insufficient documentation

## 2015-05-23 DIAGNOSIS — I25700 Atherosclerosis of coronary artery bypass graft(s), unspecified, with unstable angina pectoris: Secondary | ICD-10-CM | POA: Diagnosis not present

## 2015-05-23 DIAGNOSIS — R9439 Abnormal result of other cardiovascular function study: Secondary | ICD-10-CM

## 2015-05-23 DIAGNOSIS — E785 Hyperlipidemia, unspecified: Secondary | ICD-10-CM | POA: Diagnosis not present

## 2015-05-23 DIAGNOSIS — I447 Left bundle-branch block, unspecified: Secondary | ICD-10-CM | POA: Diagnosis not present

## 2015-05-23 DIAGNOSIS — R1031 Right lower quadrant pain: Secondary | ICD-10-CM

## 2015-05-23 DIAGNOSIS — I25810 Atherosclerosis of coronary artery bypass graft(s) without angina pectoris: Secondary | ICD-10-CM | POA: Diagnosis present

## 2015-05-23 DIAGNOSIS — E119 Type 2 diabetes mellitus without complications: Secondary | ICD-10-CM | POA: Diagnosis not present

## 2015-05-23 DIAGNOSIS — I2510 Atherosclerotic heart disease of native coronary artery without angina pectoris: Secondary | ICD-10-CM | POA: Diagnosis present

## 2015-05-23 DIAGNOSIS — Z955 Presence of coronary angioplasty implant and graft: Secondary | ICD-10-CM

## 2015-05-23 DIAGNOSIS — I209 Angina pectoris, unspecified: Secondary | ICD-10-CM | POA: Insufficient documentation

## 2015-05-23 HISTORY — DX: Angina pectoris, unspecified: I20.9

## 2015-05-23 HISTORY — DX: Essential (primary) hypertension: I10

## 2015-05-23 HISTORY — DX: Atherosclerotic heart disease of native coronary artery without angina pectoris: I25.10

## 2015-05-23 HISTORY — DX: Gastro-esophageal reflux disease without esophagitis: K21.9

## 2015-05-23 HISTORY — DX: Unspecified osteoarthritis, unspecified site: M19.90

## 2015-05-23 HISTORY — DX: Reserved for inherently not codable concepts without codable children: IMO0001

## 2015-05-23 HISTORY — PX: PERCUTANEOUS CORONARY STENT INTERVENTION (PCI-S): SHX6016

## 2015-05-23 HISTORY — PX: CARDIAC CATHETERIZATION: SHX172

## 2015-05-23 LAB — GLUCOSE, CAPILLARY
GLUCOSE-CAPILLARY: 114 mg/dL — AB (ref 65–99)
GLUCOSE-CAPILLARY: 124 mg/dL — AB (ref 65–99)
Glucose-Capillary: 130 mg/dL — ABNORMAL HIGH (ref 65–99)
Glucose-Capillary: 118 mg/dL — ABNORMAL HIGH (ref 65–99)
Glucose-Capillary: 176 mg/dL — ABNORMAL HIGH (ref 65–99)

## 2015-05-23 LAB — POCT ACTIVATED CLOTTING TIME: Activated Clotting Time: 712 seconds

## 2015-05-23 SURGERY — LEFT HEART CATH AND CORS/GRAFTS ANGIOGRAPHY
Anesthesia: LOCAL

## 2015-05-23 MED ORDER — SODIUM CHLORIDE 0.9 % IV SOLN: 250.0000 mL | INTRAVENOUS | Status: DC | PRN

## 2015-05-23 MED ORDER — MIDAZOLAM HCL 2 MG/2ML IJ SOLN
INTRAMUSCULAR | Status: AC
  Filled 2015-05-23: qty 2

## 2015-05-23 MED ORDER — ASPIRIN EC 81 MG PO TBEC
81.0000 mg | DELAYED_RELEASE_TABLET | Freq: Every day | ORAL | Status: DC
  Administered 2015-05-24: 81 mg via ORAL
  Filled 2015-05-23: qty 1

## 2015-05-23 MED ORDER — IOHEXOL 350 MG/ML SOLN
INTRAVENOUS | Status: DC | PRN
  Administered 2015-05-23 (×2): 50 mL via INTRAVENOUS
  Administered 2015-05-23: 100 mL via INTRAVENOUS
  Administered 2015-05-23: 125 mL via INTRAVENOUS

## 2015-05-23 MED ORDER — CLOPIDOGREL BISULFATE 75 MG PO TABS
ORAL_TABLET | ORAL | Status: DC | PRN
  Administered 2015-05-23: 600 mg via ORAL

## 2015-05-23 MED ORDER — MIDAZOLAM HCL 2 MG/2ML IJ SOLN
INTRAMUSCULAR | Status: DC | PRN
  Administered 2015-05-23 (×3): 1 mg via INTRAVENOUS

## 2015-05-23 MED ORDER — FENTANYL CITRATE (PF) 100 MCG/2ML IJ SOLN
INTRAMUSCULAR | Status: DC | PRN
  Administered 2015-05-23 (×3): 25 ug via INTRAVENOUS

## 2015-05-23 MED ORDER — NITROGLYCERIN 1 MG/10 ML FOR IR/CATH LAB
INTRA_ARTERIAL | Status: AC
  Filled 2015-05-23: qty 10

## 2015-05-23 MED ORDER — LIDOCAINE HCL (PF) 1 % IJ SOLN
INTRAMUSCULAR | Status: DC | PRN
  Administered 2015-05-23: 15 mL

## 2015-05-23 MED ORDER — LIDOCAINE HCL (PF) 1 % IJ SOLN
INTRAMUSCULAR | Status: AC
  Filled 2015-05-23: qty 30

## 2015-05-23 MED ORDER — SODIUM CHLORIDE 0.9 % IJ SOLN
3.0000 mL | Freq: Two times a day (BID) | INTRAMUSCULAR | Status: DC
  Administered 2015-05-24 (×2): 3 mL via INTRAVENOUS
  Administered 2015-05-24: 09:00:00 10 mL via INTRAVENOUS

## 2015-05-23 MED ORDER — NITROGLYCERIN 1 MG/10 ML FOR IR/CATH LAB
INTRA_ARTERIAL | Status: DC | PRN
  Administered 2015-05-23: 200 ug via INTRACORONARY

## 2015-05-23 MED ORDER — ONDANSETRON HCL 4 MG/2ML IJ SOLN: 4.0000 mg | Freq: Four times a day (QID) | INTRAMUSCULAR | Status: DC | PRN

## 2015-05-23 MED ORDER — BIVALIRUDIN BOLUS VIA INFUSION - CUPID
INTRAVENOUS | Status: DC | PRN
  Administered 2015-05-23: 45.6 mg via INTRAVENOUS

## 2015-05-23 MED ORDER — SODIUM CHLORIDE 0.9 % IJ SOLN: 3.0000 mL | Freq: Two times a day (BID) | INTRAMUSCULAR | Status: DC

## 2015-05-23 MED ORDER — SODIUM CHLORIDE 0.9 % IV SOLN: INTRAVENOUS | Status: DC

## 2015-05-23 MED ORDER — CLOPIDOGREL BISULFATE 300 MG PO TABS
ORAL_TABLET | ORAL | Status: AC
  Filled 2015-05-23: qty 2

## 2015-05-23 MED ORDER — SODIUM CHLORIDE 0.9 % IJ SOLN: 3.0000 mL | INTRAMUSCULAR | Status: DC | PRN

## 2015-05-23 MED ORDER — ACETAMINOPHEN 325 MG PO TABS
650.0000 mg | ORAL_TABLET | ORAL | Status: DC | PRN
  Administered 2015-05-23 – 2015-05-24 (×3): 650 mg via ORAL
  Filled 2015-05-23 (×4): qty 2

## 2015-05-23 MED ORDER — ASPIRIN 81 MG PO CHEW
CHEWABLE_TABLET | ORAL | Status: AC
  Administered 2015-05-23: 81 mg via ORAL
  Filled 2015-05-23: qty 1

## 2015-05-23 MED ORDER — ASPIRIN 81 MG PO CHEW
81.0000 mg | CHEWABLE_TABLET | ORAL | Status: AC
  Administered 2015-05-23: 81 mg via ORAL

## 2015-05-23 MED ORDER — FENTANYL CITRATE (PF) 100 MCG/2ML IJ SOLN
INTRAMUSCULAR | Status: AC
  Filled 2015-05-23: qty 2

## 2015-05-23 MED ORDER — SODIUM CHLORIDE 0.9 % IV SOLN
250.0000 mg | INTRAVENOUS | Status: DC | PRN
  Administered 2015-05-23: 1.75 mg/kg/h via INTRAVENOUS

## 2015-05-23 MED ORDER — SODIUM CHLORIDE 0.9 % IV SOLN
INTRAVENOUS | Status: DC
  Administered 2015-05-23: 07:00:00 via INTRAVENOUS

## 2015-05-23 MED ORDER — CLOPIDOGREL BISULFATE 75 MG PO TABS
75.0000 mg | ORAL_TABLET | Freq: Every day | ORAL | Status: DC
  Administered 2015-05-24 – 2015-05-25 (×2): 75 mg via ORAL
  Filled 2015-05-23 (×2): qty 1

## 2015-05-23 MED ORDER — LOSARTAN POTASSIUM 50 MG PO TABS
50.0000 mg | ORAL_TABLET | Freq: Every day | ORAL | Status: DC
  Administered 2015-05-23 – 2015-05-24 (×2): 50 mg via ORAL
  Filled 2015-05-23 (×2): qty 1

## 2015-05-23 MED ORDER — LIDOCAINE HCL (PF) 1 % IJ SOLN
INTRAMUSCULAR | Status: DC | PRN
  Administered 2015-05-23: 10:00:00

## 2015-05-23 MED ORDER — HEPARIN (PORCINE) IN NACL 2-0.9 UNIT/ML-% IJ SOLN
INTRAMUSCULAR | Status: AC
  Filled 2015-05-23: qty 1000

## 2015-05-23 MED ORDER — BIVALIRUDIN 250 MG IV SOLR
INTRAVENOUS | Status: AC
  Filled 2015-05-23: qty 250

## 2015-05-23 MED ORDER — SODIUM CHLORIDE 0.9 % IV SOLN
INTRAVENOUS | Status: DC | PRN
  Administered 2015-05-23: 50 mL/h via INTRAVENOUS

## 2015-05-23 MED ORDER — DIAZEPAM 5 MG PO TABS
5.0000 mg | ORAL_TABLET | Freq: Four times a day (QID) | ORAL | Status: DC | PRN
  Filled 2015-05-23: qty 1

## 2015-05-23 SURGICAL SUPPLY — 26 items
BALLN EMERGE MR 2.0X12 (BALLOONS) ×3
BALLN EUPHORA RX 3.0X12 (BALLOONS) ×3
BALLN ~~LOC~~ EUPHORA RX 3.5X8 (BALLOONS) ×3
BALLN ~~LOC~~ TREK RX 3.5X8 (BALLOONS) ×3
BALLOON EMERGE MR 2.0X12 (BALLOONS) IMPLANT
BALLOON EUPHORA RX 3.0X12 (BALLOONS) IMPLANT
BALLOON ~~LOC~~ EUPHORA RX 3.5X8 (BALLOONS) IMPLANT
BALLOON ~~LOC~~ TREK RX 3.5X8 (BALLOONS) IMPLANT
CATH INFINITI 5 FR IM (CATHETERS) ×2 IMPLANT
CATH INFINITI 5 FR LCB (CATHETERS) ×2 IMPLANT
CATH INFINITI 5 FR RCB (CATHETERS) ×2 IMPLANT
CATH INFINITI 5FR MULTPACK ANG (CATHETERS) ×2 IMPLANT
CATH VISTA GUIDE 6FR LCB (CATHETERS) ×2 IMPLANT
KIT ENCORE 26 ADVANTAGE (KITS) ×2 IMPLANT
KIT HEART LEFT (KITS) ×3 IMPLANT
PACK CARDIAC CATHETERIZATION (CUSTOM PROCEDURE TRAY) ×3 IMPLANT
SHEATH PINNACLE 5F 10CM (SHEATH) ×2 IMPLANT
SHEATH PINNACLE 6F 10CM (SHEATH) ×2 IMPLANT
STENT SYNERGY DES 3X16 (Permanent Stent) ×2 IMPLANT
SYR MEDRAD MARK V 150ML (SYRINGE) ×3 IMPLANT
TRANSDUCER W/STOPCOCK (MISCELLANEOUS) ×3 IMPLANT
TUBING CIL FLEX 10 FLL-RA (TUBING) ×3 IMPLANT
WIRE ASAHI PROWATER 180CM (WIRE) ×2 IMPLANT
WIRE COUGAR XT STRL 190CM (WIRE) ×2 IMPLANT
WIRE EMERALD 3MM-J .035X150CM (WIRE) ×2 IMPLANT
WIRE EMERALD 3MM-J .035X260CM (WIRE) ×2 IMPLANT

## 2015-05-23 NOTE — H&P (View-Only) (Signed)
Cardiology Office Note   Date:  05/21/2015   ID:  Dakota [name], DOB 1926-08-30, MRN [id number]  PCP:  [name], MD  Cardiologist:  Dr. Darlin Coco     Chief Complaint  Patient presents with  . Labs Only     History of Present Illness: Dakota [name] is a 81 y.o. male with a hx of CAD s/p CABG, combined systolic and diastolic CHF, LBBB, DM2.  Last echo in 2013 demonstrated EF 00-93%, mod diastolic dysfunction.  Last seen by Dr. Darlin Coco 02/2015.    I saw him 7/14 with complaints of chest pain and shortness of breath. His primary care provider had recently seen him and documented hypoxia and placed him on oxygen.  He was treated with antibiotics for suspected pneumonia.  When I saw him, his cough had improved but he complained of chest pain with exertion (CCS class 2-3 symptoms) with relief with NTG.  I set him up for a [employer].  This was abnormal with suggestion of inferior and inferolateral ischemia. He is brought back today to discuss arranging a cardiac cath.  He returns today with his son. He did have another episode of chest pain last night. He was just walking to the bathroom.  He had to take 2 NTG with relief.  He notes dyspnea with mild to moderate activities.  Denies syncope.  No orthopnea, PND, edema.  Cough is persistent but improving.     Studies/Reports Reviewed Today:  Myoview 05/15/15  The left ventricular ejection fraction is mildly decreased (45-54%).  Nuclear stress EF: 51%.  Defect 1: There is a medium defect of moderate severity present in the basal inferior, basal inferolateral, mid inferior and mid inferolateral location. Intermediate risk study with moderate inferolateral wall ischemia. The patient will follow up with Richardson Dopp PA-C. Further evaluation may be warrented.  Echo 11/2011 - Left ventricle: Mild LVH. EF 50% to 55%. Wall motion was normal.  Grade 2 diastolicdysfunction. - Mitral valve: Calcified annulus. - Left  atrium: The atrium was mildly dilated. - Atrial septum: There was an atrial septal aneurysm. Impressions:  - EF difficult to quantitate but appears to be low normal tomildy reduced.  Myoview 12/2009 Normal EF 57%  LHC 12/2003 CORONARY ANGIOGRAPHY: LM:  distal 50-60% stenosis. LAD:  proximal 60 and 70% and there is a 70-75% stenosis in the mid vessel. Distal 40%.D1 mid 70-80% LCx:  ostial 90%. OM1 is a very large and branching vessel with 95-99% stenosis right at the branch point of these two large obtuse marginal branches. RCA:  Distal 60-70% EF 65%. >> CABG   Past Medical History  Diagnosis Date  . Smallpox   . Ischemic heart disease   . Diabetes mellitus   . History of cardiovascular stress test     Lexiscan Myoview 7/16:  EF 51%, inferior, inferolateral ischemia; Intermediate Risk    Past Surgical History  Procedure Laterality Date  . Hernia repair  1979    bilat repair  . Appendectomy    . Right foot      surgery  . Cardiac catheterization  1992    normal  ([place])  . Coronary artery bypass graft  12/2003  . Doppler echocardiography  07/15/10    EF 40-45%, aortic stenosis     Current Outpatient Prescriptions  Medication Sig Dispense Refill  . Acetaminophen (TYLENOL PO) Take 500 mg by mouth daily as needed (for pain). Taking 650mg  as needed    . aspirin EC 81  MG tablet Take 81 mg by mouth daily.    Sarajane Marek Sodium 30-100 MG CAPS Take 100 mg by mouth daily.      . furosemide (LASIX) 40 MG tablet Take 1 tablet (40 mg total) by mouth daily. 90 tablet 3  . glimepiride (AMARYL) 2 MG tablet Take 1 mg by mouth daily before breakfast.     . glucose 4 GM chewable tablet Chew 16 g by mouth as needed.      [name] losartan (COZAAR) 50 MG tablet TAKE ONE TABLET BY MOUTH ONCE DAILY 90 tablet 0  . mirabegron ER (MYRBETRIQ) 50 MG TB24 tablet Take 50 mg by mouth daily.    . Multiple Vitamin (MULTIVITAMIN) tablet Take 1 tablet by mouth daily.      . Multiple  Vitamins-Minerals (PRESERVISION AREDS 2 PO) Take by mouth.    [name] NITROSTAT 0.4 MG SL tablet Place 0.4 mg under the tongue every 5 (five) minutes as needed (for chest pain).     . ONE TOUCH ULTRA TEST test strip     . pantoprazole (PROTONIX) 40 MG tablet Take 1 tablet (40 mg total) by mouth daily. 90 tablet 3  . pravastatin (PRAVACHOL) 80 MG tablet Take 80 mg by mouth daily.      [name] PROSCAR 5 MG tablet Take 5 mg by mouth daily.      No current facility-administered medications for this visit.    Allergies:   Sulfa antibiotics and Aceon    Social History:  The patient  reports that he has never smoked. He does not have any smokeless tobacco history on file. He reports that he does not drink alcohol.   Family History:  The patient's family history includes Stroke in his mother; Ulcers in his mother.    ROS:   Please see the history of present illness.   Review of Systems  HENT: Positive for hearing loss.   Cardiovascular: Positive for chest pain, dyspnea on exertion and orthopnea.  Respiratory: Positive for snoring.   Musculoskeletal: Positive for back pain.  Gastrointestinal: Positive for constipation.  Neurological: Positive for dizziness.  All other systems reviewed and are negative.     PHYSICAL EXAM: VS:  BP 120/60 mmHg  Pulse 68  Ht 5\' 6"  (1.676 m)  Wt 135 lb (61.236 kg)  BMI 21.80 kg/m2    Wt Readings from Last 3 Encounters:  05/21/15 135 lb (61.236 kg)  05/09/15 130 lb (58.968 kg)  03/19/15 131 lb (59.421 kg)     GEN: Well nourished, well developed, in no acute distress HEENT: normal Neck: no JVD,   no masses Cardiac:  Normal S1/S2, RRR; no murmur ,  no rubs or gallops, no edema   Respiratory:  clear to auscultation bilaterally, no wheezing, rhonchi or rales. GI: soft, nontender, nondistended, + BS MS: no deformity or atrophy Skin: warm and dry  Neuro:  CNs II-XII intact, Strength and sensation are intact Psych: Normal affect   EKG:  EKG is ordered today.   It demonstrates:   NSR, HR 62, LBBB   Recent Labs: 05/14/2015: BUN 23; Creatinine, Ser 0.77; Potassium 4.0; Pro B Natriuretic peptide (BNP) 260.0*; Sodium 136    Lipid Panel No results found for: CHOL, TRIG, HDL, CHOLHDL, VLDL, LDLCALC, LDLDIRECT    ASSESSMENT AND PLAN:   1.  Chest Pain:  Recent myoview intermediate risk with suggestion of inferior and inferolateral ischemia.  Symptoms sound c/w CCS Class 3 angina.  I reviewed this with Dr. Darlin Coco and  we brought the patient back today to arrange cardiac cath.  Risks and benefits of cardiac catheterization have been discussed with the patient.  These include bleeding, infection, kidney damage, stroke, heart attack, death.  The patient understands these risks and is willing to proceed.  We tried to schedule him tomorrow but the schedule is full. Will proceed with LHC on Thursday 7/28.  If he has recurrent pain, he will need to go to the ED.    2.  Coronary Artery Disease:  Proceed with cardiac cath as noted.  Continue aspirin, statin.  3.  Chronic Combined Systolic and Diastolic CHF:   Volume appears stable.    4.  Hyperlipidemia: Continue statin.   5.  Hypertension:  Controlled.     Medication Changes: Current medicines are reviewed at length with the patient today.  Concerns regarding medicines are as outlined above.  The following changes have been made:   Discontinued Medications   No medications on file   Modified Medications   Modified Medication Previous Medication   FUROSEMIDE (LASIX) 40 MG TABLET furosemide (LASIX) 20 MG tablet      Take 1 tablet (40 mg total) by mouth daily.    Take 40 mg by mouth daily.   PANTOPRAZOLE (PROTONIX) 40 MG TABLET pantoprazole (PROTONIX) 40 MG tablet      Take 1 tablet (40 mg total) by mouth daily.    Take 40 mg by mouth daily.    New Prescriptions   No medications on file    Labs/ tests ordered today include:   Orders Placed This Encounter  Procedures  . Basic Metabolic Panel  (BMET)  . CBC w/Diff  . Protime-INR  . PTT  . EKG 12-Lead     Disposition:   FU me or Dr. Darlin Coco 2 weeks after [facility].     Signed, Versie Starks, MHS 05/21/2015 4:26 PM    [facility] [place], [place], [place]  [address] Phone: [phone]; Fax: [phone]

## 2015-05-23 NOTE — Interval H&P Note (Signed)
Cath Lab Visit (complete for each Cath Lab visit)  Clinical Evaluation Leading to the Procedure:   ACS: No.  Non-ACS:    Anginal Classification: CCS III  Anti-ischemic medical therapy: Minimal Therapy (1 class of medications)  Non-Invasive Test Results: Intermediate-risk stress test findings: cardiac mortality 1-3%/year  Prior CABG: Previous CABG      History and Physical Interval Note:  05/23/2015 8:07 AM  Dakota [name]  has presented today for surgery, with the diagnosis of unstable angina  The various methods of treatment have been discussed with the patient and family. After consideration of risks, benefits and other options for treatment, the patient has consented to  Procedure(s): Left Heart Cath and Cors/Grafts Angiography (N/A) as a surgical intervention .  The patient's history has been reviewed, patient examined, no change in status, stable for surgery.  I have reviewed the patient's chart and labs.  Questions were answered to the patient's satisfaction.     Dakota [name] A

## 2015-05-23 NOTE — Progress Notes (Signed)
Patient complained of pain in right groin at 1900. Noted hematoma, pressure held for 20 minutes, dressing remains dry and intact. Area soft and no hematoma palpated after pressure held.

## 2015-05-23 NOTE — Progress Notes (Addendum)
Site area: rt groin Site Prior to Removal:  Level   0 Pressure Applied For: 30 minutes Manual:   yes Patient Status During Pull:  stable Post Pull Site:  Level  0 Post Pull Instructions Given: yes  Post Pull Pulses Present: yes Dressing Applied:  yes Bedrest begins @  1300 Comments:  0

## 2015-05-24 ENCOUNTER — Ambulatory Visit (HOSPITAL_COMMUNITY): Payer: BLUE CROSS/BLUE SHIELD

## 2015-05-24 ENCOUNTER — Other Ambulatory Visit: Payer: Self-pay | Admitting: *Deleted

## 2015-05-24 DIAGNOSIS — I25119 Atherosclerotic heart disease of native coronary artery with unspecified angina pectoris: Secondary | ICD-10-CM | POA: Diagnosis not present

## 2015-05-24 DIAGNOSIS — R1031 Right lower quadrant pain: Secondary | ICD-10-CM | POA: Diagnosis not present

## 2015-05-24 DIAGNOSIS — I25710 Atherosclerosis of autologous vein coronary artery bypass graft(s) with unstable angina pectoris: Secondary | ICD-10-CM | POA: Diagnosis not present

## 2015-05-24 DIAGNOSIS — I2582 Chronic total occlusion of coronary artery: Secondary | ICD-10-CM | POA: Diagnosis not present

## 2015-05-24 DIAGNOSIS — I25110 Atherosclerotic heart disease of native coronary artery with unstable angina pectoris: Secondary | ICD-10-CM | POA: Diagnosis not present

## 2015-05-24 DIAGNOSIS — R931 Abnormal findings on diagnostic imaging of heart and coronary circulation: Secondary | ICD-10-CM | POA: Diagnosis not present

## 2015-05-24 DIAGNOSIS — E119 Type 2 diabetes mellitus without complications: Secondary | ICD-10-CM | POA: Diagnosis not present

## 2015-05-24 LAB — GLUCOSE, CAPILLARY
Glucose-Capillary: 115 mg/dL — ABNORMAL HIGH (ref 65–99)
Glucose-Capillary: 144 mg/dL — ABNORMAL HIGH (ref 65–99)
Glucose-Capillary: 138 mg/dL — ABNORMAL HIGH (ref 65–99)
Glucose-Capillary: 132 mg/dL — ABNORMAL HIGH (ref 65–99)

## 2015-05-24 LAB — BASIC METABOLIC PANEL
Anion gap: 7 (ref 5–15)
BUN: 13 mg/dL (ref 6–20)
CHLORIDE: 104 mmol/L (ref 101–111)
CO2: 26 mmol/L (ref 22–32)
Calcium: 8.7 mg/dL — ABNORMAL LOW (ref 8.9–10.3)
Creatinine, Ser: 0.66 mg/dL (ref 0.61–1.24)
GFR calc non Af Amer: >60 mL/min (ref >60)
Glucose, Bld: 104 mg/dL — ABNORMAL HIGH (ref 65–99)
Potassium: 4.4 mmol/L (ref 3.5–5.1)
Sodium: 137 mmol/L (ref 135–145)

## 2015-05-24 LAB — CBC
HEMATOCRIT: 30.5 % — AB (ref 39.0–52.0)
HEMOGLOBIN: 10 g/dL — AB (ref 13.0–17.0)
MCH: 18.9 pg — ABNORMAL LOW (ref 26.0–34.0)
MCHC: 32.8 g/dL (ref 30.0–36.0)
MCV: 57.5 fL — AB (ref 78.0–100.0)
Platelets: 154 10*3/uL (ref 150–400)
RBC: 5.3 MIL/uL (ref 4.22–5.81)
RDW: 15.3 % (ref 11.5–15.5)
WBC: 7.7 10*3/uL (ref 4.0–10.5)

## 2015-05-24 MED ORDER — FINASTERIDE 5 MG PO TABS
5.0000 mg | ORAL_TABLET | Freq: Every day | ORAL | Status: DC
  Filled 2015-05-24: qty 1

## 2015-05-24 MED ORDER — MORPHINE SULFATE 2 MG/ML IJ SOLN: 2.0000 mg | Freq: Once | INTRAMUSCULAR | Status: AC

## 2015-05-24 MED ORDER — LOSARTAN POTASSIUM 50 MG PO TABS: 50.0000 mg | ORAL_TABLET | Freq: Every day | ORAL | Status: DC

## 2015-05-24 MED ORDER — PRESERVISION AREDS 2 PO CAPS: 1.0000 | ORAL_CAPSULE | Freq: Every day | ORAL | Status: DC

## 2015-05-24 MED ORDER — SENNOSIDES-DOCUSATE SODIUM 8.6-50 MG PO TABS: 1.0000 | ORAL_TABLET | Freq: Every evening | ORAL | Status: DC | PRN

## 2015-05-24 MED ORDER — PRAVASTATIN SODIUM 40 MG PO TABS
80.0000 mg | ORAL_TABLET | Freq: Every day | ORAL | Status: DC
  Administered 2015-05-24: 80 mg via ORAL
  Filled 2015-05-24: qty 2

## 2015-05-24 MED ORDER — NITROSTAT 0.4 MG SL SUBL: 0.4000 mg | SUBLINGUAL_TABLET | SUBLINGUAL | Status: DC | PRN

## 2015-05-24 MED ORDER — OCUVITE-LUTEIN PO CAPS
1.0000 | ORAL_CAPSULE | Freq: Every day | ORAL | Status: DC
  Administered 2015-05-24: 19:00:00 1 via ORAL
  Filled 2015-05-24 (×2): qty 1

## 2015-05-24 MED ORDER — ASPIRIN EC 81 MG PO TBEC: 81.0000 mg | DELAYED_RELEASE_TABLET | Freq: Every day | ORAL | Status: DC

## 2015-05-24 MED ORDER — MORPHINE SULFATE 2 MG/ML IJ SOLN
2.0000 mg | Freq: Once | INTRAMUSCULAR | Status: AC
  Administered 2015-05-24: 2 mg via INTRAVENOUS
  Filled 2015-05-24: qty 1

## 2015-05-24 MED ORDER — PANTOPRAZOLE SODIUM 40 MG PO TBEC
40.0000 mg | DELAYED_RELEASE_TABLET | Freq: Every day | ORAL | Status: DC
  Administered 2015-05-24: 40 mg via ORAL
  Filled 2015-05-24: qty 1

## 2015-05-24 MED ORDER — MORPHINE SULFATE 2 MG/ML IJ SOLN
2.0000 mg | Freq: Once | INTRAMUSCULAR | Status: AC
  Administered 2015-05-24: 23:00:00 2 mg via INTRAVENOUS
  Filled 2015-05-24: qty 1

## 2015-05-24 MED ORDER — MORPHINE SULFATE 2 MG/ML IJ SOLN
INTRAMUSCULAR | Status: AC
  Administered 2015-05-24: 06:00:00 2 mg via INTRAMUSCULAR
  Filled 2015-05-24: qty 1

## 2015-05-24 MED ORDER — MIRABEGRON ER 50 MG PO TB24
50.0000 mg | ORAL_TABLET | Freq: Every day | ORAL | Status: DC
  Administered 2015-05-24: 50 mg via ORAL
  Filled 2015-05-24 (×2): qty 1

## 2015-05-24 MED ORDER — GUAIFENESIN ER 600 MG PO TB12
600.0000 mg | ORAL_TABLET | Freq: Two times a day (BID) | ORAL | Status: DC
  Administered 2015-05-24 – 2015-05-25 (×3): 600 mg via ORAL
  Filled 2015-05-24 (×4): qty 1

## 2015-05-24 MED ORDER — GLIMEPIRIDE 1 MG PO TABS
1.0000 mg | ORAL_TABLET | Freq: Every day | ORAL | Status: DC
  Administered 2015-05-25: 1 mg via ORAL
  Filled 2015-05-24 (×2): qty 1

## 2015-05-24 MED ORDER — CLOPIDOGREL BISULFATE 75 MG PO TABS: 75.0000 mg | ORAL_TABLET | Freq: Every day | ORAL | Status: DC

## 2015-05-24 MED FILL — Clopidogrel Bisulfate Tab 300 MG (Base Equiv): ORAL | Qty: 2 | Status: AC

## 2015-05-24 NOTE — Progress Notes (Addendum)
Right groin ultrasound completed.  Positive for a partially thrombosed pseudoaneurysm.  The pseudo measures approximately 36 x 19 mm with 8.6 x 12 mm is patent.  The neck measures approximately 2.4 mm.

## 2015-05-24 NOTE — Progress Notes (Signed)
CARDIAC REHAB PHASE I   PRE:  Rate/Rhythm: 69 SR    BP: sitting 134/76    SaO2:   MODE:  Ambulation: 400 ft   POST:  Rate/Rhythm: 94 SR    BP: sitting 162/56     SaO2:   Assisted pt OOB (so not to strain). Denied pain walking, carried cane horizontally (as he normally does) for balance. Some SOB noted, pt denied chest tightness. Stated his groin became slightly pain/sore toward end of walk. To recliner with feet elevated. Ed completed with son-in-law present. Voiced understanding and requests referral to be sent to [facility].   [phone]  Darrick Meigs CES, ACSM 05/24/2015 9:24 AM

## 2015-05-24 NOTE — Progress Notes (Signed)
Notified Hao, PA ultrasound partially thrombosed pseudoaneurysm.

## 2015-05-24 NOTE — Progress Notes (Signed)
Patient c/o of right groin after getting out of bed. I checked his groin he had hematoma I held pressure for 20 minutes followed by a pressure dressing and placed on bedrest until MD sees patient. Patient is stable level 1 to right groin and morphine given for pain.

## 2015-05-24 NOTE — Discharge Summary (Signed)
Discharge Summary   Patient ID: Dakota [name],  MRN: [id number], DOB/AGE: 1926-02-17 81 y.o.  Admit date: 05/23/2015 Discharge date: 05/25/2015  Primary Care Provider: [name] Primary Cardiologist: Dr. Mare Ferrari  Discharge Diagnoses Principal Problem:   Abnormal nuclear stress test Active Problems:   Coronary artery disease involving native coronary artery of native heart with angina pectoris   Ischemic chest pain   CAD (coronary artery disease) of bypass graft   Allergies Allergies  Allergen Reactions  . Sulfa Antibiotics Swelling  . Pork-Derived Products     muslim  . Aceon [Perindopril Erbumine] Cough    Procedures  Cardiac catheterization 05/23/2015 Conclusion     Mid LAD lesion, 100% stenosed.  Ost Cx lesion, 100% stenosed.  LM lesion, 95% stenosed.  Prox RCA lesion, 50% stenosed.  Post Atrio lesion, 100% stenosed.  Dist RCA lesion, 50% stenosed.  SVG was injected is normal in caliber, and is anatomically normal.  SVG was injected is normal in caliber, and is anatomically normal.  was injected is normal in caliber.  There is severe disease in the graft.  Dist Graft to Insertion lesion, 99% stenosed. There is a 0% residual stenosis post intervention.  A drug-eluting stent was placed.  The left ventricular systolic function is normal.  Low normal global LV function with an ejection fraction of 50-55%.  Significant native CAD with 95% distal left main stenosis, proximal occlusion of the LAD after the first 2 septal perforating arteries; total occlusion of the circumflex at the ostium; 50% proximal RCA stenosis, 50% mid stenosis and total occlusion of the distal RCA after a small PDA-like vessel.  Patent LIMA graft supplying the mid LAD.  SVG supplying the circumflex marginal vessel with 99% distal anastomosis stenosis.  Patent SVG supplying the diagonal vessel.  Patent SVG apply the distal RCA with evidence for collateralization of the  AV groove circumflex from the PLA vessel.  Successful PCI to the SVG to obtuse marginal vessel, treated with PTCA, Synergy DES stenting with a 3.016 mm stent postdilated to 3.43 mm in the very distal portion of the graft to 3.23 mm in the native marginal vessel with the stenosis being reduced to 0% and brisk 3 TIMI-3 flow.  RECOMMENDATION:  Continued DAPT therapy and medical therapy for concomitant native CAD.      Hospital Course  The patient is a 81 year old male with history of CAD s/p CABG, combined systolic and diastolic HF, LBBB, DM2. Her last echo in 2013 demonstrated EF 50-55%, moderate diastolic dysfunction. During his office visit on 05/09/2015, he complained of chest discomfort and shortness of breath. Outpatient Myoview has been arranged which came back abnormal with inferior and inferolateral ischemia. He was seen in the office on 7/25, after further discussion, it was decided for him to undergo cardiac catheterization.   He underwent a scheduled outpatient cardiac cath on 7/28 which showed patent LIMA to mid LAD, patent SVG to diagonal, patent SVG to distal RCA with evidence for collateralization of the AV groove circumflex from the PLA vessel. 99% distal anastomosis stenosis in SVG to LCx treated with Synergt DES 3.0x50mm postdilated to 3.83mm. post cath, he did have a coin sized right femoral swelling. He was seen in the morning of 7/29, at which time he denies any significant chest discomfort or shortness breath. Per patient continued to complain of right groin pain. He ambulated with cardiac rehabilitation without too much difficulty. After ambulation, his compression dressing was taken off, the right groin does feels tender on  palpation. It is hard on palpation which suggest hematoma, however it also appears to be pulsatile. There was a faint bruit on exam. Given his symptom, an right femoral arterial ultrasound was ordered which was positive for partially thrombosed pseudoaneurysm.  I have discussed with Dr. Angelena Form, given the size and the fact it is partially thrombosed, there is no need for compression. We will keep him overnight, expecting his pseudoaneurysm to close by itself eventually, as long as he is not having significant pain in AM of 7/30, he will be deemed stable for discharge. Med rec has been done, medications sent to his pharmacy. Repeated emphasis has been placed on compliance with DAPT. Followup arranged with Dr. Sherryl Barters office.  The patient was seen by Dr. Harl Bowie who felt he was stable for DC home.   Patient be scheduled for follow-up ultrasound of his right groin for next Wednesday to recheck the pseudoaneurysm.  We will do this at [facility] in case he needs compression.  Discharge Vitals Blood pressure 150/70, pulse 78, temperature 98.1 F (36.7 C), temperature source Oral, resp. rate 20, height 5\' 6"  (1.676 m), weight 134 lb 7.7 oz (61 kg), SpO2 94 %.  Filed Weights   05/23/15 0547 05/24/15 0016 05/25/15 0001  Weight: 134 lb (60.782 kg) 138 lb 7.2 oz (62.8 kg) 134 lb 7.7 oz (61 kg)    Labs  CBC  Recent Labs  05/24/15 0301  WBC 7.7  HGB 10.0*  HCT 30.5*  MCV 57.5*  PLT 681   Basic Metabolic Panel  Recent Labs  05/24/15 0301  NA 137  K 4.4  CL 104  CO2 26  GLUCOSE 104*  BUN 13  CREATININE 0.66  CALCIUM 8.7*    Disposition  Pt is being discharged home today in good condition.  Follow-up Plans & Appointments      Follow-up Information    Follow up with Richardson Dopp, PA-C On 06/10/2015.   Specialties:  Physician Assistant, Radiology, Interventional Cardiology   Why:  @2 :20pm   Contact information:   [address] [place] [address] [phone]       Follow up with Warren Danes, MD On 07/08/2015.   Specialty:  Cardiology   Why:  @3 :00pm   Contact information:   [address] [place] [address] [phone]       Follow up with [facility] On 05/29/2015.    Why:  Ultrasound on right groin.  Our office will call you with instructions regarding this procedure.    Contact information:   [place] [address] [phone]      Discharge Medications    Medication List    TAKE these medications        aspirin EC 81 MG tablet  Take 81 mg by mouth daily.     Casanthranol-Docusate Sodium 30-100 MG Caps  Take 100 mg by mouth daily.     clopidogrel 75 MG tablet  Commonly known as:  PLAVIX  Take 1 tablet (75 mg total) by mouth daily with breakfast.     furosemide 40 MG tablet  Commonly known as:  LASIX  Take 1 tablet (40 mg total) by mouth daily.     glimepiride 2 MG tablet  Commonly known as:  AMARYL  Take 1 mg by mouth daily before breakfast.     glucose 4 GM chewable tablet  Chew 16 g by mouth as needed for low blood sugar.  losartan 50 MG tablet  Commonly known as:  COZAAR  TAKE ONE TABLET BY MOUTH ONCE DAILY     MYRBETRIQ 50 MG Tb24 tablet  Generic drug:  mirabegron ER  Take 50 mg by mouth daily.     NITROSTAT 0.4 MG SL tablet  Generic drug:  nitroGLYCERIN  Place 1 tablet (0.4 mg total) under the tongue every 5 (five) minutes as needed (for chest pain).     pantoprazole 40 MG tablet  Commonly known as:  PROTONIX  Take 1 tablet (40 mg total) by mouth daily.     pravastatin 80 MG tablet  Commonly known as:  PRAVACHOL  Take 80 mg by mouth at bedtime.     PRESERVISION AREDS 2 PO  Take 2 tablets by mouth daily.     PROSCAR 5 MG tablet  Generic drug:  finasteride  Take 5 mg by mouth daily.     TYLENOL PO  Take 500 mg by mouth daily as needed (for pain).        Duration of Discharge Encounter   Greater than 30 minutes including physician time.  Otilio Connors PA-C Pager: [phone] 05/25/2015, 9:04 AM

## 2015-05-24 NOTE — Progress Notes (Signed)
Patient Name: Dakota [name] Date of Encounter: 05/24/2015  Primary Cardiologist: Dr. Mare Ferrari   Principal Problem:   Abnormal nuclear stress test Active Problems:   Coronary artery disease involving native coronary artery of native heart with angina pectoris   Ischemic chest pain   CAD (coronary artery disease) of bypass graft    SUBJECTIVE  Denies any CP or SOB. R groin cath site sore.   CURRENT MEDS . aspirin EC  81 mg Oral Daily  . clopidogrel  75 mg Oral Q breakfast  . guaiFENesin  600 mg Oral BID  . losartan  50 mg Oral Daily  . sodium chloride  3 mL Intravenous Q12H    OBJECTIVE  Filed Vitals:   05/23/15 1700 05/23/15 2033 05/24/15 0016 05/24/15 0429  BP: 167/48 172/62 148/63 162/60  Pulse: 65 64 68 70  Temp:  97.2 F (36.2 C) 98.2 F (36.8 C) 98 F (36.7 C)  TempSrc:  Oral Oral Oral  Resp: 20 17 16 18   Height:      Weight:   138 lb 7.2 oz (62.8 kg)   SpO2: 96% 96% 96% 99%    Intake/Output Summary (Last 24 hours) at 05/24/15 0701 Last data filed at 05/24/15 0429  Gross per 24 hour  Intake 1986.67 ml  Output    825 ml  Net 1161.67 ml   Filed Weights   05/23/15 0547 05/24/15 0016  Weight: 134 lb (60.782 kg) 138 lb 7.2 oz (62.8 kg)    PHYSICAL EXAM  General: Pleasant, NAD. Neuro: Alert and oriented X 3. Moves all extremities spontaneously. Psych: Normal affect. HEENT:  Normal  Neck: Supple without bruits or JVD. Lungs:  Resp regular and unlabored, anterior exam CTA. Heart: RRR no s3, s4, or murmurs. R groin cath site covered in compression dressing.  Abdomen: Soft, non-tender, non-distended, BS + x 4.  Extremities: No clubbing, cyanosis or edema. DP/PT/Radials 2+ and equal bilaterally.  Accessory Clinical Findings  CBC  Recent Labs  05/21/15 1548 05/24/15 0301  WBC 7.0 7.7  NEUTROABS 4.1  --   HGB 10.6* 10.0*  HCT 33.4* 30.5*  MCV 60.2 Repeated and verified X2.* 57.5*  PLT 176.0 203   Basic Metabolic Panel  Recent Labs  05/21/15 1548 05/24/15 0301  NA 137 137  K 4.4 4.4  CL 103 104  CO2 30 26  GLUCOSE 153* 104*  BUN 17 13  CREATININE 0.75 0.66  CALCIUM 8.8 8.7*    TELE NSR with HR 60s, occasional junctional rhythm with HR brady down to 30s    ECG  NSR with LBBB  Echocardiogram 12/23/2011  LV EF: 50% -  55%  ------------------------------------------------------------ Indications:   CAD of native vessels 414.01.  ------------------------------------------------------------ History:  PMH: LBBB. CHF. History of Ischemic heart disesea. Risk factors: Diabetes mellitus. Dyslipidemia.  ------------------------------------------------------------ Study Conclusions  - Left ventricle: The cavity size was normal. Wall thickness was increased in a pattern of mild LVH. Systolic function was normal. The estimated ejection fraction was in the range of 50% to 55%. Wall motion was normal; there were no regional wall motion abnormalities. Features are consistent with a pseudonormal left ventricular filling pattern, with concomitant abnormal relaxation and increased filling pressure (grade 2 diastolic dysfunction). - Mitral valve: Calcified annulus. - Left atrium: The atrium was mildly dilated. - Atrial septum: There was an atrial septal aneurysm. Impressions:  - EF difficult to quantitate but appears to be low normal to mildy reduced.    Radiology/Studies  No results  found.  ASSESSMENT AND PLAN  81 yo male with h/o CAD s/p CABG, combined systolic and diastolic HF, LBBB, DM2 present for outpatient cath after abnormal myoview with inferior and inferolateral ischemia  1. Abnormal myoview   - cath 05/23/2015 patent LIMA to mid LAD, patent SVG to diagonal, patent SVG to distal RCA with evidence for collateralization of the AV groove circumflex from the PLA vessel. 99% distal anastomosis stenosis in SVG to LCx treated with Synergt DES 3.0x64mm postdilated to 3.39mm  -  continue ASA, plavix losartan. Restart pravastatin. No BB as he occasional brady down to high 30s-40s with ?junctional rhythm. Plan to restart home lasix after discharge  - continue bed rest for now for R groin hematoma. If stable likely will be discharged later this morning  2. CAD s/p CABG  - cath 12/2003 EF 65%, 60-70% distal RCA, 50-60% distal LM, 60-70% prox LAD, 70-75% mid LAD, 70-80% mid D1, 90% ost LCx, 95-99% OM1    3. combined systolic and diastolic HF 4. LBBB 5. DM2   Signed, Woodward Ku Pager: [phone]   I have personally seen and examined this patient with Almyra Deforest, PA-C. I agree with the assessment and plan as outlined above. He is doing well post PCI/stent. On aSA and Plavix. Hematoma stable. Ambulate then d/c home if groin ok. F/u planned 1-2 weeks.   MCALHANY,CHRISTOPHER 05/24/2015 7:31 AM

## 2015-05-24 NOTE — Progress Notes (Signed)
MD has been in to see pt, advised ok to ambulate.  Pressure bandage removed, bandaid D&I.  Hematoma approx 3cm diameter.  Pressure held 5 minutes, no change in size.  Pt tolerated well.  Boosted in bed.  Pt and son Advised when to call MD vs. 911 after d/c and how to hold pressure if groin swells or bleeds, andt teaching provided re: ASA and plavix, both voice understanding.

## 2015-05-24 NOTE — Discharge Instructions (Signed)
No driving for 24 hours. No lifting over 5 lbs for 1 week. No sexual activity for 1 week. Keep procedure site clean & dry. If you notice increased pain, swelling, bleeding or pus, call/return!  You may shower, but no soaking baths/hot tubs/pools for 1 week.   Monitor R groin cath site, if swelling increasing or has increasing pain, please contact cardiology group.

## 2015-05-25 ENCOUNTER — Other Ambulatory Visit: Payer: Self-pay | Admitting: Physician Assistant

## 2015-05-25 DIAGNOSIS — I25119 Atherosclerotic heart disease of native coronary artery with unspecified angina pectoris: Secondary | ICD-10-CM

## 2015-05-25 DIAGNOSIS — I729 Aneurysm of unspecified site: Secondary | ICD-10-CM

## 2015-05-25 DIAGNOSIS — T81718A Complication of other artery following a procedure, not elsewhere classified, initial encounter: Principal | ICD-10-CM

## 2015-05-25 DIAGNOSIS — I25110 Atherosclerotic heart disease of native coronary artery with unstable angina pectoris: Secondary | ICD-10-CM | POA: Diagnosis not present

## 2015-05-25 LAB — GLUCOSE, CAPILLARY: Glucose-Capillary: 134 mg/dL — ABNORMAL HIGH (ref 65–99)

## 2015-05-25 MED ORDER — NITROSTAT 0.4 MG SL SUBL: 0.4000 mg | SUBLINGUAL_TABLET | SUBLINGUAL | Status: DC | PRN

## 2015-05-25 MED ORDER — CLOPIDOGREL BISULFATE 75 MG PO TABS: 75.0000 mg | ORAL_TABLET | Freq: Every day | ORAL | Status: DC

## 2015-05-25 NOTE — Progress Notes (Signed)
Subjective: No complaints. Right groin pain seems to have resolved  Objective: Vital signs in last 24 hours: Temp:  [98.2 F (36.8 C)-99 F (37.2 C)] 98.4 F (36.9 C) (07/30 0422) Pulse Rate:  [62-72] 72 (07/30 0422) Resp:  [16-22] 19 (07/30 0422) BP: (131-165)/(51-65) 150/62 mmHg (07/30 0422) SpO2:  [93 %-96 %] 93 % (07/30 0422) Weight:  [134 lb 7.7 oz (61 kg)] 134 lb 7.7 oz (61 kg) (07/30 0001) Last BM Date: 05/23/15  Intake/Output from previous day: 07/29 0701 - 07/30 0700 In: 240 [P.O.:240] Out: 1375 [Urine:1375] Intake/Output this shift:    Medications Scheduled Meds: . aspirin EC  81 mg Oral Daily  . clopidogrel  75 mg Oral Q breakfast  . finasteride  5 mg Oral Daily  . glimepiride  1 mg Oral QAC breakfast  . guaiFENesin  600 mg Oral BID  . losartan  50 mg Oral Daily  . mirabegron ER  50 mg Oral Daily  . multivitamin-lutein  1 capsule Oral Daily  . pantoprazole  40 mg Oral Q1200  . pravastatin  80 mg Oral q1800  . sodium chloride  3 mL Intravenous Q12H   Continuous Infusions: . sodium chloride 100 mL/hr at 05/23/15 1730   PRN Meds:.sodium chloride, acetaminophen, diazepam, ondansetron (ZOFRAN) IV, senna-docusate, sodium chloride  PE: General appearance: alert, cooperative and no distress Lungs: clear to auscultation bilaterally Heart: regular rate and rhythm, S1, S2 normal, no murmur, click, rub or gallop Extremities: no lower extremity edema Pulses: 2+ and symmetric Skin: warm and dry. His moderate firm hematoma right groin. It is nontender.  The bruit is louder than yesterday. Neurologic: Grossly normal  Lab Results:   Recent Labs  05/24/15 0301  WBC 7.7  HGB 10.0*  HCT 30.5*  PLT 154   BMET  Recent Labs  05/24/15 0301  NA 137  K 4.4  CL 104  CO2 26  GLUCOSE 104*  BUN 13  CREATININE 0.66  CALCIUM 8.7*      Assessment/Plan  81 yo male with h/o CAD s/p CABG, combined systolic and diastolic HF, LBBB, DM2 present for outpatient  cath after abnormal myoview with inferior and inferolateral ischemia  1. Abnormal myoview  - cath 05/23/2015 patent LIMA to mid LAD, patent SVG to diagonal, patent SVG to distal RCA with evidence for collateralization of the AV groove circumflex from the PLA vessel. 99% distal anastomosis stenosis in SVG to LCx treated with Synergt DES 3.0x24mm postdilated to 3.53mm - continue ASA, plavix losartan. Restart pravastatin. No BB as he occasional brady down to high 30s-40s with  ?junctional rhythm. Plan to restart home lasix after discharge  2. CAD s/p CABG - cath 12/2003 EF 65%, 60-70% distal RCA, 50-60% distal LM, 60-70% prox LAD, 70-75% mid LAD, 70-80% mid D1, 90% ost LCx, 95-99% OM1  3. combined systolic and diastolic HF  Euvolemic 4. LBBB 5. DM2 6. Pseudoaneurysm Post cath.  Found with doppler.  Small.  They did not compress it yesterday.  The pain seems to have resolved. However, the bruit sounds louder. We'll need to recheck it at follow-up and may need another ultrasound.  Ambulate this morning if he does well at the key can go home.   Dakota Fuller PA-C 05/25/2015 7:08 AM  Patient seen and discussed with PA Dakota [name], I agree with his documentation above. ?p DES to SVG-LCX, he is on appropriate medical therapy. Small pseudoaneurysm at cath site, patient denies any current groin pain. Ambulated without significant symptoms. Will  need repeat imaging within the next week, if not regressing may need consideration for [place] compression or thrombin injection. Will plan for [place] early to mid next week.   [name] MD

## 2015-05-27 ENCOUNTER — Encounter (HOSPITAL_COMMUNITY): Payer: Self-pay | Admitting: Cardiovascular Disease

## 2015-06-03 ENCOUNTER — Other Ambulatory Visit: Payer: Self-pay | Admitting: *Deleted

## 2015-06-03 ENCOUNTER — Ambulatory Visit (HOSPITAL_COMMUNITY)
Admission: RE | Admit: 2015-06-03 | Discharge: 2015-06-03 | Disposition: A | Discharge status: Home / Self Care | Payer: Medicare Other | Source: Ambulatory Visit | Attending: Physician Assistant | Admitting: Physician Assistant

## 2015-06-03 DIAGNOSIS — I728 Aneurysm of other specified arteries: Secondary | ICD-10-CM | POA: Insufficient documentation

## 2015-06-03 DIAGNOSIS — E119 Type 2 diabetes mellitus without complications: Secondary | ICD-10-CM | POA: Insufficient documentation

## 2015-06-03 DIAGNOSIS — I10 Essential (primary) hypertension: Secondary | ICD-10-CM | POA: Insufficient documentation

## 2015-06-03 DIAGNOSIS — Z951 Presence of aortocoronary bypass graft: Secondary | ICD-10-CM | POA: Insufficient documentation

## 2015-06-03 DIAGNOSIS — T81718A Complication of other artery following a procedure, not elsewhere classified, initial encounter: Secondary | ICD-10-CM

## 2015-06-03 DIAGNOSIS — I729 Aneurysm of unspecified site: Secondary | ICD-10-CM

## 2015-06-03 DIAGNOSIS — E785 Hyperlipidemia, unspecified: Secondary | ICD-10-CM | POA: Insufficient documentation

## 2015-06-03 MED ORDER — LIDOCAINE HCL 1 % IJ SOLN: 10.0000 mL | Freq: Once | INTRAMUSCULAR | Status: DC

## 2015-06-03 MED ORDER — NORMAL SALINE FLUSH 0.9 % IV SOLN: Freq: Once | INTRAVENOUS | Status: DC

## 2015-06-03 MED ORDER — MIDAZOLAM HCL 2 MG/2ML IJ SOLN: 2.0000 mg | Freq: Once | INTRAMUSCULAR | Status: DC

## 2015-06-03 MED ORDER — HYDROMORPHONE HCL PF 1 MG/ML IJ SOLN: 1.0000 mg | INTRAMUSCULAR | Status: DC | PRN

## 2015-06-03 MED ORDER — THROMBIN 1000 UNITS EX SOLR: Freq: Once | CUTANEOUS | Status: DC

## 2015-06-03 MED ORDER — SOLUTION-PLUS STOPCOCK MISC: Freq: Once | Status: DC

## 2015-06-03 MED ORDER — POVIDONE-IODINE SCRUB SPONGE 10 % EX SWAB: 1.0000 | Freq: Once | CUTANEOUS | Status: DC

## 2015-06-04 ENCOUNTER — Encounter: Payer: Self-pay | Admitting: Physician Assistant

## 2015-06-04 ENCOUNTER — Ambulatory Visit (HOSPITAL_COMMUNITY)
Admission: RE | Admit: 2015-06-04 | Discharge: 2015-06-04 | Disposition: A | Discharge status: Home / Self Care | Payer: BLUE CROSS/BLUE SHIELD | Source: Ambulatory Visit | Attending: Cardiovascular Disease | Admitting: Cardiovascular Disease

## 2015-06-04 ENCOUNTER — Observation Stay: Admission: AD | Admit: 2015-06-04 | Payer: Medicare Other | Source: Ambulatory Visit | Admitting: Cardiovascular Disease

## 2015-06-04 ENCOUNTER — Other Ambulatory Visit: Payer: Self-pay | Admitting: Physician Assistant

## 2015-06-04 ENCOUNTER — Ambulatory Visit (HOSPITAL_COMMUNITY): Payer: Medicare Other

## 2015-06-04 ENCOUNTER — Other Ambulatory Visit: Payer: Self-pay | Admitting: Cardiovascular Disease

## 2015-06-04 DIAGNOSIS — I729 Aneurysm of unspecified site: Secondary | ICD-10-CM

## 2015-06-04 LAB — GLUCOSE, CAPILLARY: GLUCOSE-CAPILLARY: 139 mg/dL — AB (ref 65–99)

## 2015-06-04 MED ORDER — THROMBIN 5000 UNITS EX SOLR: 1.0000 "application " | Freq: Once | INTRAMUSCULAR | Status: DC

## 2015-06-04 MED ORDER — THROMBIN 5000 UNITS EX SOLR
CUTANEOUS | Status: DC
  Filled 2015-06-04: qty 5000

## 2015-06-04 MED ORDER — LIDOCAINE HCL (PF) 1 % IJ SOLN
INTRAMUSCULAR | Status: AC
  Filled 2015-06-04: qty 30

## 2015-06-04 MED ORDER — HYDROMORPHONE HCL 1 MG/ML IJ SOLN
INTRAMUSCULAR | Status: AC
  Administered 2015-06-04: 0.5 mg
  Filled 2015-06-04: qty 1

## 2015-06-04 NOTE — Progress Notes (Signed)
Post compression of pseudoaneurysm to right groin, site unremarkable.  Pt alert and oriented. VSS see epic.  Discharge instructions dicussed and reviewed with pt and son both verbalize understanding.  Discharge position home.

## 2015-06-04 NOTE — Progress Notes (Signed)
Pt arrived for pseudoaneurysm thrombin injection. Pseudoaneurysm was already thrombosed for the majority of the space with mild residual communication, thus Dr. Gwenlyn Found deferred thrombin injection. Compression was performed x 20 minutes - did not resolve pseudoaneurysm completely but the active communication did reduce in size. Dr. Gwenlyn Found recommends f/u LE art duplex in 2 weeks, scheduled for 9am on 06/19/15. Keep f/u as scheduled 06/10/15 with Richardson Dopp. Work note given - no work until cleared from pseudoaneurysm standpoint. (He still works as a Tourist information centre manager at age 81!) Activity restrictions reviewed by Dr. Gwenlyn Found.   Khayden Herzberg PA-C

## 2015-06-06 ENCOUNTER — Telehealth: Payer: Self-pay | Admitting: Cardiology

## 2015-06-06 NOTE — Telephone Encounter (Signed)
New message      Calling to get info to process patients disability Need to verify date of heart cath and when he can return to work.   Also need date doctor took him out of work.  Employee ID number [id number].  They have conflicting dates.

## 2015-06-06 NOTE — Telephone Encounter (Signed)
Will forward to Kim in medical records to f/u with information needed for disability.

## 2015-06-09 NOTE — Progress Notes (Signed)
Cardiology Office Note   Date:  06/10/2015   ID:  Dakota [name], DOB 08-21-1926, MRN [id number]  PCP:  [name], MD  Cardiologist:  Dr. Darlin Coco     Chief Complaint  Patient presents with  . Coronary Artery Disease    s/p PCI  . Pseudoaneurysm     History of Present Illness: Dakota [name] is a 81 y.o. male with a hx of CAD s/p CABG, combined systolic and diastolic CHF, LBBB, DM2.  Last echo in 2013 demonstrated EF 25-95%, mod diastolic dysfunction.  Last seen by Dr. Darlin Coco 02/2015.    I saw him 7/14 with complaints of chest pain and shortness of breath. His primary care provider had recently seen him and documented hypoxia and placed him on oxygen.  He was treated with antibiotics for suspected pneumonia.  When I saw him, his cough had improved but he complained of chest pain with exertion (CCS class 2-3 symptoms) with relief with NTG.  I set him up for a [employer].  This was abnormal with suggestion of inferior and inferolateral ischemia.   We set him up with a cardiac catheterization which was performed 05/23/15. This demonstrated 99% distal S-OM graft disease which was treated with a Synergy DES.  Procedure was complicated by R femoral artery pseudoaneurysm.  This was partially thrombosed.  He was DC and set up for a FU ultrasound last week.  This demonstrated a 4.5 cm pseudoaneurysm. He was sent to the hospital for thrombin injection. He was evaluated by Dr. Gwenlyn Found. It was noted that the pseudoaneurysm was already thrombosed for the majority of the space with mild residual communication. Therefore thrombin injection was not performed. The area was compressed for 20 minutes. It was recommended a follow-up ultrasound be performed in 2 weeks. He returns for follow-up.  He is here with his son.  Denies any further chest pain. He still is SOB with activity and notes a cough that has been persistent.  It is productive of clear to white sputum.  No fever,  hemoptysis.  No orthopnea, PND, edema. No syncope.  No increasing abdominal girth.  R groin pain stable.    Studies/Reports Reviewed Today:  LHC 05/23/15 LM: 95% LAD: Mid 100% LCx: Ostial 100% RCA: Proximal 50%, distal 50%, R posterior AVB 100% SVG-R posterior AVB: Normal SVG-D2: Normal SVG-OM2 99% distal LIMA-LAD patent LVEF normal PCI: 3 x 16 mm Synergy DES to the SVG-OM2  Significant native CAD with 95% distal left main stenosis, proximal occlusion of the LAD after the first 2 septal perforating arteries; total occlusion of the circumflex at the ostium; 50% proximal RCA stenosis, 50% mid stenosis and total occlusion of the distal RCA after a small PDA-like vessel. Patent LIMA graft supplying the mid LAD. SVG supplying the circumflex marginal vessel with 99% distal anastomosis stenosis. Patent SVG supplying the diagonal vessel. Patent SVG apply the distal RCA with evidence for collateralization of the AV groove circumflex from the PLA vessel. Successful PCI to the SVG to obtuse marginal vessel, treated with PTCA, Synergy DES stenting with a 3.016 mm stent postdilated to 3.43 mm in the very distal portion of the graft to 3.23 mm in the native marginal vessel with the stenosis being reduced to 0% and brisk 3 TIMI-3 flow. RECOMMENDATION:Continued DAPT therapy and medical therapy for concomitant native CAD.          Myoview 05/15/15  The left ventricular ejection fraction is mildly decreased (45-54%).  Nuclear stress EF:  51%. Intermediate risk study with moderate inferolateral wall ischemia.   Echo 11/2011 Mild LVH, EF 50-55%, normal wall motion, grade 2 diastolic dysfunction, MAC, mild LAE, atrial septal aneurysm  Myoview 12/2009 Normal EF 57%  LHC 12/2003 CORONARY ANGIOGRAPHY: LM:  distal 50-60% stenosis. LAD:  proximal 60 and 70% and there is a 70-75% stenosis in the mid vessel. Distal 40%.D1 mid 70-80% LCx:  ostial 90%. OM1 is a very large and branching vessel with  95-99% stenosis right at the branch point of these two large obtuse marginal branches. RCA:  Distal 60-70% EF 65%. >> CABG   Past Medical History  Diagnosis Date  . Smallpox   . Ischemic heart disease     a. s/p CABG;  b.  LHC 05/23/15:  S-RPAVB ok, S-D2 ok, S-OM2 99% (s/p Synergy DES), L-LAD ok, EF normal (complicated by pseudoaneurysm)   . Diabetes mellitus   . History of cardiovascular stress test     Lexiscan Myoview 7/16:  EF 51%, inferior, inferolateral ischemia; Intermediate Risk  . Coronary artery disease   . Anginal pain   . Hypertension   . Shortness of breath dyspnea   . GERD (gastroesophageal reflux disease)   . Arthritis     Past Surgical History  Procedure Laterality Date  . Hernia repair  1979    bilat repair  . Appendectomy    . Right foot      surgery  . Coronary artery bypass graft  12/2003  . Doppler echocardiography  07/15/10    EF 40-45%, aortic stenosis  . Percutaneous coronary stent intervention (pci-s)  05/23/2015    des    ptca svg  . Cardiac catheterization  1992    normal  ([place])  . Cardiac catheterization N/A 05/23/2015    Procedure: Left Heart Cath and Cors/Grafts Angiography;  Surgeon: Troy Sine, MD;  Location: [facility];  Service: Cardiovascular;  Laterality: N/A;  . Cardiac catheterization N/A 05/23/2015    Procedure: Coronary Stent Intervention;  Surgeon: Troy Sine, MD;  Location: [facility];  Service: Cardiovascular;  Laterality: N/A;     Current Outpatient Prescriptions  Medication Sig Dispense Refill  . Acetaminophen (TYLENOL PO) Take 500 mg by mouth daily as needed (for pain).     [name] aspirin EC 81 MG tablet Take 81 mg by mouth daily.    Sarajane Marek Sodium 30-100 MG CAPS Take 100 mg by mouth daily.      . clopidogrel (PLAVIX) 75 MG tablet Take 1 tablet (75 mg total) by mouth daily with breakfast. 90 tablet 3  . furosemide (LASIX) 40 MG tablet Take 1 tablet (40 mg total) by mouth daily. 90 tablet 3   . glimepiride (AMARYL) 2 MG tablet Take 1 mg by mouth daily before breakfast.     . glucose 4 GM chewable tablet Chew 16 g by mouth as needed for low blood sugar.     . losartan (COZAAR) 50 MG tablet Take 0.5 tablets (25 mg total) by mouth daily.    . mirabegron ER (MYRBETRIQ) 50 MG TB24 tablet Take 50 mg by mouth daily.    . Multiple Vitamins-Minerals (PRESERVISION AREDS 2 PO) Take 2 tablets by mouth daily.     [name] NITROSTAT 0.4 MG SL tablet Place 1 tablet (0.4 mg total) under the tongue every 5 (five) minutes as needed (for chest pain). 75 tablet 2  . ONE TOUCH ULTRA TEST test strip 1 each by Other route as needed (glucose monoring).     [name]  pantoprazole (PROTONIX) 40 MG tablet Take 1 tablet (40 mg total) by mouth daily. 90 tablet 3  . pravastatin (PRAVACHOL) 80 MG tablet Take 80 mg by mouth at bedtime.     [name] PROSCAR 5 MG tablet Take 5 mg by mouth daily.      Current Facility-Administered Medications  Medication Dose Route Frequency Provider Last Rate Last Dose  . HYDROmorphone (DILAUDID) injection 1 mg  1 mg Intravenous Q2H PRN Lorretta Harp, MD      . lidocaine (XYLOCAINE) 1 % (with pres) injection 10 mL  10 mL Intradermal Once Lorretta Harp, MD      . midazolam (VERSED) injection 2 mg  2 mg Intravenous Once Lorretta Harp, MD      . Normal Saline Flush 0.9 % SOLN   Intravenous Once Lorretta Harp, MD      . Povidone-Iodine Scrub Sponge (BETADINE) 10 % swab 1 each  1 each Topical Once Lorretta Harp, MD      . SOLUTION-PLUS STOPCOCK MISC   Does not apply Once Lorretta Harp, MD        Allergies:   Sulfa antibiotics; Pork-derived products; and Aceon    Social History:  The patient  reports that he has never smoked. He has never used smokeless tobacco. He reports that he does not drink alcohol or use illicit drugs.   Family History:  The patient's family history includes Stroke in his mother; Ulcers in his mother.    ROS:   Please see the history of present illness.     Review of Systems  Constitution: Positive for malaise/fatigue.  Respiratory: Positive for cough.   Musculoskeletal: Positive for back pain and joint pain.  All other systems reviewed and are negative.    PHYSICAL EXAM: VS:  BP 96/42 mmHg  Pulse 69  Ht 5\' 6"  (1.676 m)  Wt 133 lb 9.6 oz (60.601 kg)  BMI 21.57 kg/m2  SpO2 96%    Wt Readings from Last 3 Encounters:  06/10/15 133 lb 9.6 oz (60.601 kg)  06/04/15 132 lb (59.875 kg)  05/25/15 134 lb 7.7 oz (61 kg)     GEN: Well nourished, well developed, in no acute distress HEENT: normal Neck: no JVD,   no masses Cardiac:  Normal S1/S2, RRR; no murmur ,  no rubs or gallops, no edema; R groin hematoma is moderate in size, no pulsatility or bruit noted.    Respiratory:  clear to auscultation bilaterally, no wheezing, rhonchi or rales. GI: soft, nontender, nondistended, + BS MS: no deformity or atrophy Skin: warm and dry  Neuro:  CNs II-XII intact, Strength and sensation are intact Psych: Normal affect   EKG:  EKG is ordered today.  It demonstrates:  NSR, HR 69, LBBB, PVC   Recent Labs: 05/14/2015: Pro B Natriuretic peptide (BNP) 260.0* 05/24/2015: BUN 13; Creatinine, Ser 0.66; Hemoglobin 10.0*; Platelets 154; Potassium 4.4; Sodium 137    Lipid Panel No results found for: CHOL, TRIG, HDL, CHOLHDL, VLDL, LDLCALC, LDLDIRECT    ASSESSMENT AND PLAN:   1.  Coronary Artery Disease:  He is s/p DES to the SVG-OM graft.  Other grafts were patent.  We discussed the need for dual antiplatelet Rx.  Continue ASA, Plavix, ARB, statin.  Refer to cardiac rehab.    2.  Pseudoaneurysm:   R groin appears stable.  Check CBC today.   He will have a repeat US next week to further evaluate.    3.  Chronic  Combined Systolic and Diastolic CHF:   He remains SOB with activity and notes continued, persistent cough.  He does not appear volume overloaded.  I will obtain a BMET, BNP today and a CXR. If findings are unremarkable, I will review with Dr.  Mare Ferrari whether we should consider referral to Pulmonology.   4.  Hyperlipidemia: Continue statin.   5.  Hypertension:  BP running low.  Decrease Cozaar to 25 mg QD.   6.  Dyspnea:  As noted, he remains SOB with activity. O2 sats good on RA today. Lungs are clear on exam.  SOB has not changed with PCI.  I will get a BNP, CXR.  Consider referral to Pulmonology.  I will discuss this with Dr. Mare Ferrari.      Medication Changes: Current medicines are reviewed at length with the patient today.  Concerns regarding medicines are as outlined above.  The following changes have been made:   Discontinued Medications   No medications on file   Modified Medications   Modified Medication Previous Medication   CLOPIDOGREL (PLAVIX) 75 MG TABLET clopidogrel (PLAVIX) 75 MG tablet      Take 1 tablet (75 mg total) by mouth daily with breakfast.    Take 1 tablet (75 mg total) by mouth daily with breakfast.   LOSARTAN (COZAAR) 50 MG TABLET losartan (COZAAR) 50 MG tablet      Take 0.5 tablets (25 mg total) by mouth daily.    TAKE ONE TABLET BY MOUTH ONCE DAILY   New Prescriptions   No medications on file    Labs/ tests ordered today include:   Orders Placed This Encounter  Procedures  . DG Chest 2 View  . Basic Metabolic Panel (BMET)  . CBC w/Diff  . B Nat Peptide  . EKG 12-Lead     Disposition:    FU Dr. Darlin Coco 07/08/15 as planned.  Return sooner if SOB worsening.  I will keep him out of work until he sees Dr. Mare Ferrari for FU to give him plenty of time to increase his activity and to make sure his pseudoaneurysm resolves completely. I gave him a letter today.       Signed, Versie Starks, MHS 06/10/2015 4:34 PM    [facility] [address], [place], [place]  [address] Phone: [phone]; Fax: [phone]

## 2015-06-10 ENCOUNTER — Encounter: Payer: Self-pay | Admitting: Physician Assistant

## 2015-06-10 ENCOUNTER — Ambulatory Visit: Payer: BLUE CROSS/BLUE SHIELD | Admitting: Physician Assistant

## 2015-06-10 ENCOUNTER — Ambulatory Visit
Admission: RE | Admit: 2015-06-10 | Discharge: 2015-06-10 | Disposition: A | Discharge status: Home / Self Care | Payer: BLUE CROSS/BLUE SHIELD | Source: Ambulatory Visit | Attending: Physician Assistant | Admitting: Physician Assistant

## 2015-06-10 ENCOUNTER — Ambulatory Visit (INDEPENDENT_AMBULATORY_CARE_PROVIDER_SITE_OTHER): Payer: BLUE CROSS/BLUE SHIELD | Admitting: Physician Assistant

## 2015-06-10 ENCOUNTER — Telehealth: Payer: Self-pay | Admitting: *Deleted

## 2015-06-10 VITALS — BP 96/42 | HR 69 | Ht 66.0 in | Wt 133.6 lb

## 2015-06-10 DIAGNOSIS — R0602 Shortness of breath: Secondary | ICD-10-CM

## 2015-06-10 DIAGNOSIS — E785 Hyperlipidemia, unspecified: Secondary | ICD-10-CM

## 2015-06-10 DIAGNOSIS — I25119 Atherosclerotic heart disease of native coronary artery with unspecified angina pectoris: Secondary | ICD-10-CM

## 2015-06-10 DIAGNOSIS — R05 Cough: Secondary | ICD-10-CM

## 2015-06-10 DIAGNOSIS — R059 Cough, unspecified: Secondary | ICD-10-CM

## 2015-06-10 DIAGNOSIS — I10 Essential (primary) hypertension: Secondary | ICD-10-CM

## 2015-06-10 DIAGNOSIS — I5042 Chronic combined systolic (congestive) and diastolic (congestive) heart failure: Secondary | ICD-10-CM

## 2015-06-10 DIAGNOSIS — I2510 Atherosclerotic heart disease of native coronary artery without angina pectoris: Secondary | ICD-10-CM

## 2015-06-10 DIAGNOSIS — I729 Aneurysm of unspecified site: Secondary | ICD-10-CM

## 2015-06-10 DIAGNOSIS — T81718A Complication of other artery following a procedure, not elsewhere classified, initial encounter: Secondary | ICD-10-CM

## 2015-06-10 DIAGNOSIS — I998 Other disorder of circulatory system: Secondary | ICD-10-CM

## 2015-06-10 LAB — CBC WITH DIFFERENTIAL/PLATELET
BASOS ABS: 0 10*3/uL (ref 0.0–0.1)
Basophils Relative: 0.5 % (ref 0.0–3.0)
EOS ABS: 0.3 10*3/uL (ref 0.0–0.7)
Eosinophils Relative: 3.4 % (ref 0.0–5.0)
HEMATOCRIT: 33.9 % — AB (ref 39.0–52.0)
Hemoglobin: 10.4 g/dL — ABNORMAL LOW (ref 13.0–17.0)
LYMPHS PCT: 27.2 % (ref 12.0–46.0)
Lymphs Abs: 2.3 10*3/uL (ref 0.7–4.0)
MCHC: 30.8 g/dL (ref 30.0–36.0)
MCV: 61 fl — ABNORMAL LOW (ref 78.0–100.0)
Monocytes Absolute: 0.7 10*3/uL (ref 0.1–1.0)
Monocytes Relative: 8 % (ref 3.0–12.0)
NEUTROS ABS: 5.2 10*3/uL (ref 1.4–7.7)
NEUTROS PCT: 60.9 % (ref 43.0–77.0)
PLATELETS: 212 10*3/uL (ref 150.0–400.0)
RBC: 5.55 Mil/uL (ref 4.22–5.81)
RDW: 16.1 % — ABNORMAL HIGH (ref 11.5–15.5)
WBC: 8.6 10*3/uL (ref 4.0–10.5)

## 2015-06-10 LAB — BASIC METABOLIC PANEL
BUN: 24 mg/dL — ABNORMAL HIGH (ref 6–23)
CO2: 29 mEq/L (ref 19–32)
CREATININE: 0.85 mg/dL (ref 0.40–1.50)
Calcium: 9.1 mg/dL (ref 8.4–10.5)
Chloride: 100 mEq/L (ref 96–112)
GFR: 109.56 mL/min (ref >60.00)
Glucose, Bld: 123 mg/dL — ABNORMAL HIGH (ref 70–99)
Potassium: 4.6 mEq/L (ref 3.5–5.1)
Sodium: 135 mEq/L (ref 135–145)

## 2015-06-10 LAB — BRAIN NATRIURETIC PEPTIDE: Pro B Natriuretic peptide (BNP): 137 pg/mL — ABNORMAL HIGH (ref 0.0–100.0)

## 2015-06-10 MED ORDER — LOSARTAN POTASSIUM 50 MG PO TABS: 25.0000 mg | ORAL_TABLET | Freq: Every day | ORAL | Status: DC

## 2015-06-10 MED ORDER — CLOPIDOGREL BISULFATE 75 MG PO TABS: 75.0000 mg | ORAL_TABLET | Freq: Every day | ORAL | Status: DC

## 2015-06-10 NOTE — Telephone Encounter (Signed)
Pt asked for [place] to talk to his son De Nurse; need to fill out DPR. Son notified of lab/cxr results by phone with verbal understanding.

## 2015-06-10 NOTE — Patient Instructions (Signed)
Medication Instructions:  1. DECREASE COZAAR TO 25 MG DAILY (THIS IS 1/2 TAB DAILY)  Labwork: TODAY; BMET, CBC W/DIFF, BNP  Testing/Procedures: A chest x-ray takes a picture of the organs and structures inside the chest, including the heart, lungs, and blood vessels. This test can show several things, including, whether the heart is enlarges; whether fluid is building up in the lungs; and whether pacemaker / defibrillator leads are still in place. [facility] [place]: KEEP YOUR APPT WITH DR. Mare Ferrari 07/08/15  Any Other Special Instructions Will Be Listed Below (If Applicable). [facility] [place]

## 2015-06-11 ENCOUNTER — Telehealth: Payer: Self-pay | Admitting: Physician Assistant

## 2015-06-11 DIAGNOSIS — R0602 Shortness of breath: Secondary | ICD-10-CM

## 2015-06-11 DIAGNOSIS — R05 Cough: Secondary | ICD-10-CM

## 2015-06-11 DIAGNOSIS — R059 Cough, unspecified: Secondary | ICD-10-CM

## 2015-06-11 NOTE — Telephone Encounter (Signed)
Reviewed with Dr. Darlin Coco. We want him to see pulmonology. Please refer to Pulmonology  Dx:  Dyspnea; cough Richardson Dopp, PA-C   06/11/2015 5:41 PM

## 2015-06-12 NOTE — Telephone Encounter (Signed)
Pt notified being referred to Pulmonology dx cough, sob. Pt said ok and thank you for our help. I advised our office or Pulmonology will call him with an appt, pt said ok.

## 2015-06-12 NOTE — Addendum Note (Signed)
Addended by: Michae Kava on: 06/12/2015 09:46 AM   Modules accepted: Orders

## 2015-06-13 ENCOUNTER — Encounter: Payer: Self-pay | Admitting: Pulmonary Disease

## 2015-06-13 ENCOUNTER — Other Ambulatory Visit: Payer: Self-pay | Admitting: Cardiovascular Disease

## 2015-06-13 ENCOUNTER — Ambulatory Visit (INDEPENDENT_AMBULATORY_CARE_PROVIDER_SITE_OTHER): Payer: BLUE CROSS/BLUE SHIELD | Admitting: Pulmonary Disease

## 2015-06-13 VITALS — BP 136/60 | HR 71 | Ht 66.0 in | Wt 133.0 lb

## 2015-06-13 DIAGNOSIS — I5042 Chronic combined systolic (congestive) and diastolic (congestive) heart failure: Secondary | ICD-10-CM

## 2015-06-13 DIAGNOSIS — T81718A Complication of other artery following a procedure, not elsewhere classified, initial encounter: Principal | ICD-10-CM

## 2015-06-13 DIAGNOSIS — R0602 Shortness of breath: Secondary | ICD-10-CM

## 2015-06-13 DIAGNOSIS — I729 Aneurysm of unspecified site: Secondary | ICD-10-CM

## 2015-06-13 DIAGNOSIS — J449 Chronic obstructive pulmonary disease, unspecified: Secondary | ICD-10-CM | POA: Insufficient documentation

## 2015-06-13 DIAGNOSIS — I25119 Atherosclerotic heart disease of native coronary artery with unspecified angina pectoris: Secondary | ICD-10-CM

## 2015-06-13 NOTE — Addendum Note (Signed)
Addended by: Len Blalock on: 06/13/2015 03:45 PM   Modules accepted: Orders

## 2015-06-13 NOTE — Patient Instructions (Signed)
We will arrange a lung function test and call you with the results I recommend that she participate in cardiac rehabilitation We will see you back in 4 weeks or sooner if needed

## 2015-06-13 NOTE — Assessment & Plan Note (Signed)
He appears euvolemic on exam today. I do not recommend a change to his medications.

## 2015-06-13 NOTE — Assessment & Plan Note (Signed)
He has exertional shortness of breath which has been present for years. Unfortunately it did not improve much after his most recent cardiac catheterization with stent placement. Today on physical exam he has completely clear lungs, his recent chest x-ray does not show a significant abnormality other than cardiomegaly. His most recent hemoglobin is 10 which she says is probably baseline for him. He does not have neuromuscular weakness on exam.  His oximetry is normal.  I explained to him today that shortness of breath has a lengthy differential diagnosis which includes heart disease, lung disease, neuromuscular disease, and anemia.  For him, I cannot find clear evidence of lung disease but we should order a pulmonary function test to ensure that his lung health is fine. He has cardiac disease as well as anemia which certainly contribute to his shortness of breath though he appears euvolemic on exam today. I think he is deconditioned and so participating in cardiac rehabilitation would be wise.  Plan: Full pulmonary function testing If pulmonary function testing shows and unexpected abnormality then we can order a CT scan of his chest for further evaluation I recommend that he participate in cardiac rehabilitation If workup is unrevealing in his shortness of breath does not improve with rehabilitation then we could consider cardiopulmonary exercise testing

## 2015-06-13 NOTE — Progress Notes (Signed)
Subjective:    Patient ID: Dakota [name], male    DOB: [birth year], 81 y.o.   MRN: [id number]  HPI Chief Complaint  Patient presents with  . Advice Only    Referred by Dr. Mare Ferrari for cough, shortness of breath post heart cath X1 month.     He has been having more trouble with shortness of breath despite having a recent heart cath with stent.  This has been associated with a cough in the last two months which has improved a little.  He had white mucus production.  The cough is better now.    The dyspnea: Has not really been worse this year Has been present for many years Has been associated with a cough Is worse with walking, he can walk through [place] without stopping, but sometimes he has to stop.  He can carry groceries but it is hard.  Climbing stairs is hard. Is associated with wheezing sometimes, but this hasn't been a problem lately  He never smoked.  He always worked in a clerical position.  He has worked for [employer] for 18 years.  No family history of lung problems.  He had no respiratory problems as a child with the exception of typhoid fever in the third grade.  He has never been hospitalized for pneumonia, but he had a bad spell of bronchitis about 30 years ago.  This was treated with expectorants.  His chest pain has improved significantly since having the stent placed.  He has no more chest tightness or dyspnea.  He still has some mild chest tightness if he moves too fast.   Past Medical History  Diagnosis Date  . Smallpox   . Ischemic heart disease     a. s/p CABG;  b.  LHC 05/23/15:  S-RPAVB ok, S-D2 ok, S-OM2 99% (s/p Synergy DES), L-LAD ok, EF normal (complicated by pseudoaneurysm)   . Diabetes mellitus   . History of cardiovascular stress test     Lexiscan Myoview 7/16:  EF 51%, inferior, inferolateral ischemia; Intermediate Risk  . Coronary artery disease   . Anginal pain   . Hypertension   . Shortness of breath dyspnea   . GERD (gastroesophageal reflux  disease)   . Arthritis      Family History  Problem Relation Age of Onset  . Ulcers Mother   . Stroke Mother      Social History   Social History  . Marital Status: Married    Spouse Name: N/A  . Number of Children: 2 d  . Years of Education: N/A   Occupational History  . accountant     [employer]   Social History Main Topics  . Smoking status: Never Smoker   . Smokeless tobacco: Never Used  . Alcohol Use: No  . Drug Use: No  . Sexual Activity: Not on file   Other Topics Concern  . Not on file   Social History Narrative     Allergies  Allergen Reactions  . Sulfa Antibiotics Swelling  . Pork-Derived Products     muslim  . Aceon [Perindopril Erbumine] Cough     Outpatient Prescriptions Prior to Visit  Medication Sig Dispense Refill  . Acetaminophen (TYLENOL PO) Take 500 mg by mouth daily as needed (for pain).     [name] aspirin EC 81 MG tablet Take 81 mg by mouth daily.    Sarajane Marek Sodium 30-100 MG CAPS Take 100 mg by mouth daily.      . clopidogrel (PLAVIX)  75 MG tablet Take 1 tablet (75 mg total) by mouth daily with breakfast. 90 tablet 3  . furosemide (LASIX) 40 MG tablet Take 1 tablet (40 mg total) by mouth daily. 90 tablet 3  . glimepiride (AMARYL) 2 MG tablet Take 1 mg by mouth daily before breakfast.     . glucose 4 GM chewable tablet Chew 16 g by mouth as needed for low blood sugar.     . losartan (COZAAR) 50 MG tablet Take 0.5 tablets (25 mg total) by mouth daily.    . mirabegron ER (MYRBETRIQ) 50 MG TB24 tablet Take 50 mg by mouth daily.    . Multiple Vitamins-Minerals (PRESERVISION AREDS 2 PO) Take 2 tablets by mouth daily.     [name] NITROSTAT 0.4 MG SL tablet Place 1 tablet (0.4 mg total) under the tongue every 5 (five) minutes as needed (for chest pain). 75 tablet 2  . ONE TOUCH ULTRA TEST test strip 1 each by Other route as needed (glucose monoring).     . pantoprazole (PROTONIX) 40 MG tablet Take 1 tablet (40 mg total) by mouth daily. 90  tablet 3  . pravastatin (PRAVACHOL) 80 MG tablet Take 80 mg by mouth at bedtime.     [name] PROSCAR 5 MG tablet Take 5 mg by mouth daily.      Facility-Administered Medications Prior to Visit  Medication Dose Route Frequency Provider Last Rate Last Dose  . HYDROmorphone (DILAUDID) injection 1 mg  1 mg Intravenous Q2H PRN Lorretta Harp, MD      . lidocaine (XYLOCAINE) 1 % (with pres) injection 10 mL  10 mL Intradermal Once Lorretta Harp, MD      . midazolam (VERSED) injection 2 mg  2 mg Intravenous Once Lorretta Harp, MD      . Normal Saline Flush 0.9 % SOLN   Intravenous Once Lorretta Harp, MD      . Povidone-Iodine Scrub Sponge (BETADINE) 10 % swab 1 each  1 each Topical Once Lorretta Harp, MD      . [place]   Does not apply Once Lorretta Harp, MD           Review of Systems  Constitutional: Negative for fever and unexpected weight change.  HENT: Positive for congestion. Negative for dental problem, ear pain, nosebleeds, postnasal drip, rhinorrhea, sinus pressure, sneezing, sore throat and trouble swallowing.   Eyes: Negative for redness and itching.  Respiratory: Positive for cough and shortness of breath. Negative for chest tightness and wheezing.   Cardiovascular: Negative for palpitations and leg swelling.  Gastrointestinal: Negative for nausea and vomiting.  Genitourinary: Negative for dysuria.  Musculoskeletal: Negative for joint swelling.  Skin: Negative for rash.  Neurological: Negative for headaches.  Hematological: Does not bruise/bleed easily.  Psychiatric/Behavioral: Negative for dysphoric mood. The patient is not nervous/anxious.        Objective:   Physical Exam  Filed Vitals:   06/13/15 1454  BP: 136/60  Pulse: 71  Height: 5\' 6"  (1.676 m)  Weight: 133 lb (60.328 kg)  SpO2: 100%  RA  Gen: elderly, frail appearing, but no acute distress HENT: NCAT, OP clear, neck supple without masses Eyes: PERRL, EOMi Lymph: no cervical  lymphadenopathy PULM: CTA B CV: RRR, no mgr, no JVD GI: BS+, soft, nontender, no hsm Derm: no rash or skin breakdown MSK: normal bulk and tone Neuro: A&Ox4, CN II-XII intact, strength 5/5 in all 4 extremities Psyche: normal mood and affect  06/10/2015 cardiology records reviewed where it was felt that he did not have evidence of worsening coronary disease and he was referred to [place] for dyspnea 06/10/2015 chest x-ray images personally reviewed showing mild cardiomegaly, post CABG changes, increased AP diameter and likely emphysema, no other pulmonary parenchymal abnormalities noted    Assessment & Plan:  Shortness of breath He has exertional shortness of breath which has been present for years. Unfortunately it did not improve much after his most recent cardiac catheterization with stent placement. Today on physical exam he has completely clear lungs, his recent chest x-ray does not show a significant abnormality other than cardiomegaly. His most recent hemoglobin is 10 which she says is probably baseline for him. He does not have neuromuscular weakness on exam.  His oximetry is normal.  I explained to him today that shortness of breath has a lengthy differential diagnosis which includes heart disease, lung disease, neuromuscular disease, and anemia.  For him, I cannot find clear evidence of lung disease but we should order a pulmonary function test to ensure that his lung health is fine. He has cardiac disease as well as anemia which certainly contribute to his shortness of breath though he appears euvolemic on exam today. I think he is deconditioned and so participating in cardiac rehabilitation would be wise.  Plan: Full pulmonary function testing If pulmonary function testing shows and unexpected abnormality then we can order a CT scan of his chest for further evaluation I recommend that he participate in cardiac rehabilitation If workup is unrevealing in his shortness of breath does  not improve with rehabilitation then we could consider cardiopulmonary exercise testing  Chronic combined systolic and diastolic congestive heart failure He appears euvolemic on exam today. I do not recommend a change to his medications.     Current outpatient prescriptions:  .  Acetaminophen (TYLENOL PO), Take 500 mg by mouth daily as needed (for pain). , Disp: , Rfl:  .  aspirin EC 81 MG tablet, Take 81 mg by mouth daily., Disp: , Rfl:  .  Casanthranol-Docusate Sodium 30-100 MG CAPS, Take 100 mg by mouth daily.  , Disp: , Rfl:  .  clopidogrel (PLAVIX) 75 MG tablet, Take 1 tablet (75 mg total) by mouth daily with breakfast., Disp: 90 tablet, Rfl: 3 .  furosemide (LASIX) 40 MG tablet, Take 1 tablet (40 mg total) by mouth daily., Disp: 90 tablet, Rfl: 3 .  glimepiride (AMARYL) 2 MG tablet, Take 1 mg by mouth daily before breakfast. , Disp: , Rfl:  .  glucose 4 GM chewable tablet, Chew 16 g by mouth as needed for low blood sugar. , Disp: , Rfl:  .  losartan (COZAAR) 50 MG tablet, Take 0.5 tablets (25 mg total) by mouth daily., Disp: , Rfl:  .  mirabegron ER (MYRBETRIQ) 50 MG TB24 tablet, Take 50 mg by mouth daily., Disp: , Rfl:  .  Multiple Vitamins-Minerals (PRESERVISION AREDS 2 PO), Take 2 tablets by mouth daily. , Disp: , Rfl:  .  NITROSTAT 0.4 MG SL tablet, Place 1 tablet (0.4 mg total) under the tongue every 5 (five) minutes as needed (for chest pain)., Disp: 75 tablet, Rfl: 2 .  ONE TOUCH ULTRA TEST test strip, 1 each by Other route as needed (glucose monoring). , Disp: , Rfl:  .  pantoprazole (PROTONIX) 40 MG tablet, Take 1 tablet (40 mg total) by mouth daily., Disp: 90 tablet, Rfl: 3 .  pravastatin (PRAVACHOL) 80 MG tablet, Take 80 mg by mouth  at bedtime. , Disp: , Rfl:  .  PROSCAR 5 MG tablet, Take 5 mg by mouth daily. , Disp: , Rfl:   Current facility-administered medications:  .  HYDROmorphone (DILAUDID) injection 1 mg, 1 mg, Intravenous, Q2H PRN, Lorretta Harp, MD .  lidocaine  (XYLOCAINE) 1 % (with pres) injection 10 mL, 10 mL, Intradermal, Once, Lorretta Harp, MD .  midazolam (VERSED) injection 2 mg, 2 mg, Intravenous, Once, Lorretta Harp, MD .  Normal Saline Flush 0.9 % SOLN, , Intravenous, Once, Lorretta Harp, MD .  Povidone-Iodine Scrub Sponge (BETADINE) 10 % swab 1 each, 1 each, Topical, Once, Lorretta Harp, MD .  [place], , Does not apply, Once, Lorretta Harp, MD

## 2015-06-19 ENCOUNTER — Ambulatory Visit (HOSPITAL_COMMUNITY)
Admission: RE | Admit: 2015-06-19 | Discharge: 2015-06-19 | Disposition: A | Discharge status: Home / Self Care | Payer: BLUE CROSS/BLUE SHIELD | Source: Ambulatory Visit | Attending: Cardiovascular Disease | Admitting: Cardiovascular Disease

## 2015-06-19 DIAGNOSIS — I998 Other disorder of circulatory system: Secondary | ICD-10-CM

## 2015-06-19 DIAGNOSIS — I729 Aneurysm of unspecified site: Secondary | ICD-10-CM | POA: Diagnosis not present

## 2015-06-19 DIAGNOSIS — E119 Type 2 diabetes mellitus without complications: Secondary | ICD-10-CM | POA: Diagnosis not present

## 2015-06-19 DIAGNOSIS — Z951 Presence of aortocoronary bypass graft: Secondary | ICD-10-CM | POA: Insufficient documentation

## 2015-06-19 DIAGNOSIS — T81718A Complication of other artery following a procedure, not elsewhere classified, initial encounter: Secondary | ICD-10-CM

## 2015-06-19 DIAGNOSIS — I10 Essential (primary) hypertension: Secondary | ICD-10-CM | POA: Insufficient documentation

## 2015-06-19 DIAGNOSIS — E785 Hyperlipidemia, unspecified: Secondary | ICD-10-CM | POA: Insufficient documentation

## 2015-06-20 ENCOUNTER — Ambulatory Visit (HOSPITAL_COMMUNITY)
Admission: RE | Admit: 2015-06-20 | Discharge: 2015-06-20 | Disposition: A | Discharge status: Home / Self Care | Payer: BLUE CROSS/BLUE SHIELD | Source: Ambulatory Visit | Attending: Pulmonary Disease | Admitting: Pulmonary Disease

## 2015-06-20 DIAGNOSIS — R05 Cough: Secondary | ICD-10-CM | POA: Insufficient documentation

## 2015-06-20 DIAGNOSIS — R0602 Shortness of breath: Secondary | ICD-10-CM | POA: Diagnosis not present

## 2015-06-20 DIAGNOSIS — J449 Chronic obstructive pulmonary disease, unspecified: Secondary | ICD-10-CM

## 2015-06-20 LAB — PULMONARY FUNCTION TEST
DL/VA % PRED: 93 %
DL/VA: 3.87 ml/min/mmHg/L
DLCO UNC % PRED: 45 %
DLCO UNC: 11.1 ml/min/mmHg
FEF 25-75 POST: 0.82 L/s
FEF 25-75 PRE: 0.61 L/s
FEF2575-%Change-Post: 35 %
FEF2575-%PRED-POST: 67 %
FEF2575-%PRED-PRE: 50 %
FEV1-%CHANGE-POST: 5 %
FEV1-%PRED-PRE: 67 %
FEV1-%Pred-Post: 70 %
FEV1-Post: 1.28 L
FEV1-Pre: 1.22 L
FEV1FVC-%Change-Post: -14 %
FEV1FVC-%PRED-PRE: 89 %
FEV6-%CHANGE-POST: 15 %
FEV6-%PRED-POST: 82 %
FEV6-%Pred-Pre: 71 %
FEV6-POST: 2.02 L
FEV6-Pre: 1.75 L
FEV6FVC-%CHANGE-POST: -5 %
FEV6FVC-%PRED-POST: 96 %
FEV6FVC-%Pred-Pre: 102 %
FVC-%Change-Post: 23 %
FVC-%PRED-POST: 86 %
FVC-%Pred-Pre: 70 %
FVC-Post: 2.29 L
FVC-Pre: 1.86 L
POST FEV1/FVC RATIO: 56 %
PRE FEV1/FVC RATIO: 65 %
PRE FEV6/FVC RATIO: 94 %
Post FEV6/FVC ratio: 88 %
RV % pred: 121 %
RV: 3.02 L
TLC % PRED: 83 %
TLC: 4.91 L

## 2015-06-20 MED ORDER — ALBUTEROL SULFATE (2.5 MG/3ML) 0.083% IN NEBU
2.5000 mg | INHALATION_SOLUTION | Freq: Once | RESPIRATORY_TRACT | Status: AC
  Administered 2015-06-20: 2.5 mg via RESPIRATORY_TRACT

## 2015-06-23 ENCOUNTER — Encounter: Payer: Self-pay | Admitting: Cardiology

## 2015-06-25 NOTE — Telephone Encounter (Signed)
Forms received, filled out and faxed  Confirmation received

## 2015-07-03 ENCOUNTER — Encounter: Payer: Self-pay | Admitting: *Deleted

## 2015-07-08 ENCOUNTER — Ambulatory Visit (INDEPENDENT_AMBULATORY_CARE_PROVIDER_SITE_OTHER): Payer: BLUE CROSS/BLUE SHIELD | Admitting: Cardiology

## 2015-07-08 ENCOUNTER — Encounter: Payer: Self-pay | Admitting: *Deleted

## 2015-07-08 ENCOUNTER — Encounter: Payer: Self-pay | Admitting: Cardiology

## 2015-07-08 VITALS — BP 130/66 | HR 74 | Ht 65.0 in | Wt 134.0 lb

## 2015-07-08 DIAGNOSIS — I998 Other disorder of circulatory system: Secondary | ICD-10-CM | POA: Diagnosis not present

## 2015-07-08 DIAGNOSIS — I5042 Chronic combined systolic (congestive) and diastolic (congestive) heart failure: Secondary | ICD-10-CM | POA: Diagnosis not present

## 2015-07-08 DIAGNOSIS — I2510 Atherosclerotic heart disease of native coronary artery without angina pectoris: Secondary | ICD-10-CM | POA: Diagnosis not present

## 2015-07-08 DIAGNOSIS — I729 Aneurysm of unspecified site: Secondary | ICD-10-CM

## 2015-07-08 DIAGNOSIS — T81718A Complication of other artery following a procedure, not elsewhere classified, initial encounter: Principal | ICD-10-CM

## 2015-07-08 NOTE — Progress Notes (Signed)
Cardiology Office Note   Date:  07/08/2015   ID:  Dakota [name], DOB 1926/10/06, MRN [id number]  PCP:  [name], MD  Cardiologist: Darlin Coco MD  No chief complaint on file.     History of Present Illness: Dakota [name] is a 81 y.o. male who presents for follow-up visit.  Dakota [name] is a 81 y.o. male with a hx of CAD s/p CABG, combined systolic and diastolic CHF, LBBB, DM2. Last echo in 2013 demonstrated EF 09-32%, mod diastolic dysfunction.   The patient was seen 7/14 with complaints of chest pain and shortness of breath. His primary care provider had recently seen him and documented hypoxia and placed him on oxygen.He was treated initially with Tresa Res.  His cough improved but he continued to have dyspnea.  He underwent  a Lexicographer. This was abnormal with suggestion of inferior and inferolateral ischemia.   We set him up with a cardiac catheterization which was performed 05/23/15. This demonstrated 99% distal S-OM graft disease which was treated with a Synergy DES. Procedure was complicated by R femoral artery pseudoaneurysm. This was partially thrombosed. He was DC and set up for a FU ultrasound last week. This demonstrated a 4.5 cm pseudoaneurysm. He was sent to the hospital for thrombin injection. He was evaluated by Dr. Gwenlyn Found. It was noted that the pseudoaneurysm was already thrombosed for the majority of the space with mild residual communication. Therefore thrombin injection was not performed. The area was compressed for 20 minutes.    Denies any further chest pain.  He is very pleased with the results of his stent performed by Dr. [name].  He is not having any chest pain and his shortness of breath has resolved.  He feels stronger and has more energy.  He is anxious to get back to work.  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   Past Medical History  Diagnosis Date  . Smallpox   . Ischemic heart disease     a. s/p CABG;  b.   LHC 05/23/15:  S-RPAVB ok, S-D2 ok, S-OM2 99% (s/p Synergy DES), L-LAD ok, EF normal (complicated by pseudoaneurysm)   . Diabetes mellitus   . History of cardiovascular stress test     Lexiscan Myoview 7/16:  EF 51%, inferior, inferolateral ischemia; Intermediate Risk  . Coronary artery disease   . Anginal pain   . Hypertension   . Shortness of breath dyspnea   . GERD (gastroesophageal reflux disease)   . Arthritis     Past Surgical History  Procedure Laterality Date  . Hernia repair Bilateral 1979  . Appendectomy    . Foot surgery Right   . Coronary artery bypass graft  12/2003  . Doppler echocardiography  07/15/10    EF 40-45%, aortic stenosis  . Percutaneous coronary stent intervention (pci-s)  05/23/2015    des    ptca svg  . Cardiac catheterization  1992    normal  ([place])  . Cardiac catheterization N/A 05/23/2015    Procedure: Left Heart Cath and Cors/Grafts Angiography;  Surgeon: Troy Sine, MD;  Location: [facility];  Service: Cardiovascular;  Laterality: N/A;  . Cardiac catheterization N/A 05/23/2015    Procedure: Coronary Stent Intervention;  Surgeon: Troy Sine, MD;  Location: [facility];  Service: Cardiovascular;  Laterality: N/A;     Current Outpatient Prescriptions  Medication Sig Dispense Refill  . Acetaminophen (TYLENOL PO) Take 500  mg by mouth daily as needed (for pain).     [name] aspirin EC 81 MG tablet Take 81 mg by mouth daily.    Sarajane Marek Sodium 30-100 MG CAPS Take 100 mg by mouth daily.      . clopidogrel (PLAVIX) 75 MG tablet Take 1 tablet (75 mg total) by mouth daily with breakfast. 90 tablet 3  . furosemide (LASIX) 40 MG tablet Take 1 tablet (40 mg total) by mouth daily. 90 tablet 3  . glimepiride (AMARYL) 2 MG tablet Take 1 mg by mouth daily before breakfast.     . glucose 4 GM chewable tablet Chew 16 g by mouth as needed for low blood sugar.     . losartan (COZAAR) 50 MG tablet Take 0.5 tablets (25 mg total) by mouth  daily.    . mirabegron ER (MYRBETRIQ) 50 MG TB24 tablet Take 50 mg by mouth daily.    . Multiple Vitamins-Minerals (PRESERVISION AREDS 2 PO) Take 2 tablets by mouth daily.     [name] NITROSTAT 0.4 MG SL tablet Place 1 tablet (0.4 mg total) under the tongue every 5 (five) minutes as needed (for chest pain). 75 tablet 2  . ONE TOUCH ULTRA TEST test strip 1 each by Other route as needed (glucose monoring).     . pantoprazole (PROTONIX) 40 MG tablet Take 1 tablet (40 mg total) by mouth daily. 90 tablet 3  . pravastatin (PRAVACHOL) 80 MG tablet Take 80 mg by mouth at bedtime.     [name] PROSCAR 5 MG tablet Take 5 mg by mouth daily.      Current Facility-Administered Medications  Medication Dose Route Frequency Provider Last Rate Last Dose  . HYDROmorphone (DILAUDID) injection 1 mg  1 mg Intravenous Q2H PRN Lorretta Harp, MD      . lidocaine (XYLOCAINE) 1 % (with pres) injection 10 mL  10 mL Intradermal Once Lorretta Harp, MD      . midazolam (VERSED) injection 2 mg  2 mg Intravenous Once Lorretta Harp, MD      . Normal Saline Flush 0.9 % SOLN   Intravenous Once Lorretta Harp, MD      . Povidone-Iodine Scrub Sponge (BETADINE) 10 % swab 1 each  1 each Topical Once Lorretta Harp, MD      . SOLUTION-PLUS STOPCOCK MISC   Does not apply Once Lorretta Harp, MD        Allergies:   Sulfa antibiotics; Pork-derived products; and Aceon    Social History:  The patient  reports that he has never smoked. He has never used smokeless tobacco. He reports that he does not drink alcohol or use illicit drugs.   Family History:  The patient's family history includes Healthy in his father; Stroke in his mother; Ulcers in his mother.    ROS:  Please see the history of present illness.   Otherwise, review of systems are positive for none.   All other systems are reviewed and negative.    PHYSICAL EXAM: VS:  BP 130/66 mmHg  Pulse 74  Ht 5\' 5"  (1.651 m)  Wt 134 lb (60.782 kg)  BMI 22.30 kg/m2 , BMI Body  mass index is 22.3 kg/(m^2). GEN: Well nourished, well developed, in no acute distress HEENT: normal Neck: no JVD, carotid bruits, or masses Cardiac: RRR; no murmurs, rubs, or gallops,no edema  Respiratory:  clear to auscultation bilaterally, normal work of breathing GI: soft, nontender, nondistended, + BS.  The right groin  is no longer tender and there is no bruit.  There is a palpable collection of old blood still present as a knot under the skin. MS: no deformity or atrophy Skin: warm and dry, no rash Neuro:  Strength and sensation are intact Psych: euthymic mood, full affect   EKG:  EKG is not ordered today.    Recent Labs: 06/10/2015: BUN 24*; Creatinine, Ser 0.85; Hemoglobin 10.4*; Platelets 212.0; Potassium 4.6; Pro B Natriuretic peptide (BNP) 137.0*; Sodium 135    Lipid Panel No results found for: CHOL, TRIG, HDL, CHOLHDL, VLDL, LDLCALC, LDLDIRECT    Wt Readings from Last 3 Encounters:  07/08/15 134 lb (60.782 kg)  06/13/15 133 lb (60.328 kg)  06/10/15 133 lb 9.6 oz (60.601 kg)        ASSESSMENT AND PLAN:  1. Coronary Artery Disease: He is s/p DES to the SVG-OM graft. Other grafts were patent. We discussed the need for dual antiplatelet Rx. Continue ASA, Plavix, ARB, statin. Refer to cardiac rehab.   2. Pseudoaneurysm: R groin appears stable. No further symptoms of pain.  Patient is ambulating well.   3. Chronic Combined Systolic and Diastolic HQP:RFFMBWG has improved.  He has seen pulmonary.  He had pulmonary function studies and has a return appointment scheduled with pulmonary to discuss  4. Hyperlipidemia: Continue statin.   5. Hypertension:BP stable on low dose losartan 25 mg daily   6. Dyspnea:Improved..    Current medicines are reviewed at length with the patient today.  The patient does not have concerns regarding medicines.  The following changes have been made:  no change  Labs/ tests ordered today include:  No orders of the  defined types were placed in this encounter.    Disposition: We filled out papers allowing him to return to work.  He will return to work on 07/11/15.  He will continue current medication.  He'll be rechecked in 3 months for follow-up office visit.  Berna Spare MD 07/08/2015 5:10 PM    [facility] [place], [place], [place]  [address] Phone: [phone]; Fax: [phone]

## 2015-07-08 NOTE — Patient Instructions (Signed)
Medication Instructions:  Your physician recommends that you continue on your current medications as directed. Please refer to the Current Medication list given to you today.  Labwork: none  Testing/Procedures: none  Follow-Up: Your physician recommends that you schedule a follow-up appointment in: 3 month ov  Any Other Special Instructions Will Be Listed Below (If Applicable). OK TO RETURN TO WORK 07/11/15

## 2015-07-12 ENCOUNTER — Telehealth: Payer: Self-pay | Admitting: Pulmonary Disease

## 2015-07-12 NOTE — Telephone Encounter (Signed)
Notes Recorded by Juanito Doom, MD on 06/21/2015 at 12:39 PM A, Please let him know that this showed problems with airflow and with his ability to get oxygen into his lungs. I would like for him to have a HRCT read by Entrikin or Bleitz, reason, dyspnea, abnormal PFT. Please have him pick up a Symbicort inhaler from the office as well, with instructions for 2 puffs bid. Can give him one of the samples from my desk. Thanks ----  lmtcb on VM

## 2015-07-14 ENCOUNTER — Other Ambulatory Visit: Payer: Self-pay | Admitting: Cardiology

## 2015-07-15 ENCOUNTER — Ambulatory Visit: Payer: BLUE CROSS/BLUE SHIELD | Admitting: Pulmonary Disease

## 2015-07-15 NOTE — Telephone Encounter (Signed)
lmtcb X2 for pt.  

## 2015-07-16 NOTE — Telephone Encounter (Signed)
lmomtcb x 3  

## 2015-07-17 ENCOUNTER — Encounter: Payer: Self-pay | Admitting: Pulmonary Disease

## 2015-07-17 ENCOUNTER — Ambulatory Visit (INDEPENDENT_AMBULATORY_CARE_PROVIDER_SITE_OTHER): Payer: BLUE CROSS/BLUE SHIELD | Admitting: Pulmonary Disease

## 2015-07-17 ENCOUNTER — Telehealth: Payer: Self-pay | Admitting: Pulmonary Disease

## 2015-07-17 VITALS — BP 116/72 | HR 70 | Ht 66.0 in | Wt 133.0 lb

## 2015-07-17 DIAGNOSIS — Z23 Encounter for immunization: Secondary | ICD-10-CM | POA: Diagnosis not present

## 2015-07-17 DIAGNOSIS — R942 Abnormal results of pulmonary function studies: Secondary | ICD-10-CM | POA: Diagnosis not present

## 2015-07-17 DIAGNOSIS — J449 Chronic obstructive pulmonary disease, unspecified: Secondary | ICD-10-CM

## 2015-07-17 DIAGNOSIS — I5042 Chronic combined systolic (congestive) and diastolic (congestive) heart failure: Secondary | ICD-10-CM | POA: Diagnosis not present

## 2015-07-17 MED ORDER — IPRATROPIUM-ALBUTEROL 18-103 MCG/ACT IN AERO: 2.0000 | INHALATION_SPRAY | Freq: Four times a day (QID) | RESPIRATORY_TRACT | Status: DC | PRN

## 2015-07-17 NOTE — Progress Notes (Signed)
Subjective:    Patient ID: Dakota [name], male    DOB: [birth year], 81 y.o.   MRN: [id number]  Synopsis: First seen by [facility] in 2016 for evaluation of shortness of breath. He has a history of ischemic heart disease. Found to have COPD which is unusual because he never smoked cigarettes. 05/2015 PFT: Ratio 56%, FEV1 1.28 L (70% pred, 5% change with BD), TLC 4.91 (83% pred), DLCO 11.10 (45%)  HPI Chief Complaint  Patient presents with  . Follow-up    Pt here for 1 month follow up. Pt states breathing has improved. Denies any cough, SOB, wheezing or chest tightness/congestion.    Mr. Darrow Bussing has been feeling better since the last visit. He says that he's been walking more with rehabilitation and feeling a bit stronger. He has minimal cough. He denies shortness of breath. He says that he does not have any problems breathing at any time. He's ready to go back to work at [employer]. He denies chest pain. He said that he remembers before his cardiac problem he used to wheeze more frequently but he has not had that since. He has not been using any inhalers.  Past Medical History  Diagnosis Date  . Smallpox   . Ischemic heart disease     a. s/p CABG;  b.  LHC 05/23/15:  S-RPAVB ok, S-D2 ok, S-OM2 99% (s/p Synergy DES), L-LAD ok, EF normal (complicated by pseudoaneurysm)   . Diabetes mellitus   . History of cardiovascular stress test     Lexiscan Myoview 7/16:  EF 51%, inferior, inferolateral ischemia; Intermediate Risk  . Coronary artery disease   . Anginal pain   . Hypertension   . Shortness of breath dyspnea   . GERD (gastroesophageal reflux disease)   . Arthritis       Review of Systems     Objective:   Physical Exam Filed Vitals:   07/17/15 1349  BP: 116/72  Pulse: 70  Height: 5\' 6"  (1.676 m)  Weight: 133 lb (60.328 kg)  SpO2: 98%   Gen: well appearing HENT: OP clear, TM's clear, neck supple PULM: CTA B, normal percussion CV: RRR, no mgr, trace edema GI: BS+, soft,  nontender Derm: no cyanosis or rash Psyche: normal mood and affect      Assessment & Plan:  COPD, moderate He has moderate airflow obstruction based on his PFTs, but he has has no symptoms at this time.   We discussed the details of COPD today, but he is not interested in treatment. However, it's a bit unusual that he has airflow obstruction and a diminished DLCO despite never having smoked cigarettes. I explained to him today that there is a slight chance that he may have a genetic condition which predisposes him to COPD. I think he had a severe case of bronchitis that lead to this episode, but now he feels great.  Plan: Resolution CT chest to look for emphysema, if there is emphysema and then we will tested for alpha-1 antitrypsin deficiency combivent Rx given in case symptoms recur Flu shot today Return if symtpoms return      Current outpatient prescriptions:  .  Acetaminophen (TYLENOL PO), Take 500 mg by mouth daily as needed (for pain). , Disp: , Rfl:  .  aspirin EC 81 MG tablet, Take 81 mg by mouth daily., Disp: , Rfl:  .  Casanthranol-Docusate Sodium 30-100 MG CAPS, Take 100 mg by mouth daily.  , Disp: , Rfl:  .  clopidogrel (  PLAVIX) 75 MG tablet, Take 1 tablet (75 mg total) by mouth daily with breakfast., Disp: 90 tablet, Rfl: 3 .  furosemide (LASIX) 40 MG tablet, Take 1 tablet (40 mg total) by mouth daily., Disp: 90 tablet, Rfl: 3 .  glimepiride (AMARYL) 2 MG tablet, Take 1 mg by mouth daily before breakfast. , Disp: , Rfl:  .  glucose 4 GM chewable tablet, Chew 16 g by mouth as needed for low blood sugar. , Disp: , Rfl:  .  losartan (COZAAR) 50 MG tablet, Take 0.5 tablets (25 mg total) by mouth daily., Disp: , Rfl:  .  mirabegron ER (MYRBETRIQ) 50 MG TB24 tablet, Take 50 mg by mouth daily., Disp: , Rfl:  .  Multiple Vitamins-Minerals (PRESERVISION AREDS 2 PO), Take 2 tablets by mouth daily. , Disp: , Rfl:  .  NITROSTAT 0.4 MG SL tablet, Place 1 tablet (0.4 mg total) under  the tongue every 5 (five) minutes as needed (for chest pain)., Disp: 75 tablet, Rfl: 2 .  ONE TOUCH ULTRA TEST test strip, 1 each by Other route as needed (glucose monoring). , Disp: , Rfl:  .  pantoprazole (PROTONIX) 40 MG tablet, Take 1 tablet (40 mg total) by mouth daily., Disp: 90 tablet, Rfl: 3 .  pravastatin (PRAVACHOL) 80 MG tablet, Take 80 mg by mouth at bedtime. , Disp: , Rfl:  .  PROSCAR 5 MG tablet, Take 5 mg by mouth daily. , Disp: , Rfl:  .  albuterol-ipratropium (COMBIVENT) 18-103 MCG/ACT inhaler, Inhale 2 puffs into the lungs 4 (four) times daily as needed for wheezing., Disp: 1 Inhaler, Rfl: 2  Current facility-administered medications:  .  HYDROmorphone (DILAUDID) injection 1 mg, 1 mg, Intravenous, Q2H PRN, Lorretta Harp, MD .  lidocaine (XYLOCAINE) 1 % (with pres) injection 10 mL, 10 mL, Intradermal, Once, Lorretta Harp, MD .  midazolam (VERSED) injection 2 mg, 2 mg, Intravenous, Once, Lorretta Harp, MD .  Normal Saline Flush 0.9 % SOLN, , Intravenous, Once, Lorretta Harp, MD .  Povidone-Iodine Scrub Sponge (BETADINE) 10 % swab 1 each, 1 each, Topical, Once, Lorretta Harp, MD .  [place], , Does not apply, Once, Lorretta Harp, MD

## 2015-07-17 NOTE — Telephone Encounter (Signed)
Spoke w/ pt and he is requesting to speak with Dr. Lake Bells again. Pt placed back in exam room

## 2015-07-17 NOTE — Telephone Encounter (Signed)
LMTCB and will close per triage protocol 

## 2015-07-17 NOTE — Assessment & Plan Note (Addendum)
He has moderate airflow obstruction based on his PFTs, but he has has no symptoms at this time.   We discussed the details of COPD today, but he is not interested in treatment. However, it's a bit unusual that he has airflow obstruction and a diminished DLCO despite never having smoked cigarettes. I explained to him today that there is a slight chance that he may have a genetic condition which predisposes him to COPD. I think he had a severe case of bronchitis that lead to this episode, but now he feels great.  Plan: Resolution CT chest to look for emphysema, if there is emphysema and then we will tested for alpha-1 antitrypsin deficiency combivent Rx given in case symptoms recur Flu shot today Return if symtpoms return

## 2015-07-17 NOTE — Patient Instructions (Signed)
Take combivent as needed for shortness of breath should the problem recur; you would need to tak 1 puff every 6 hours as needed for shortness of breath We will see you back if the dyspnea recurs

## 2015-07-22 ENCOUNTER — Telehealth: Payer: Self-pay | Admitting: Pulmonary Disease

## 2015-07-22 ENCOUNTER — Ambulatory Visit (INDEPENDENT_AMBULATORY_CARE_PROVIDER_SITE_OTHER)
Admission: RE | Admit: 2015-07-22 | Discharge: 2015-07-22 | Disposition: A | Discharge status: Home / Self Care | Payer: BLUE CROSS/BLUE SHIELD | Source: Ambulatory Visit | Attending: Pulmonary Disease | Admitting: Pulmonary Disease

## 2015-07-22 DIAGNOSIS — J449 Chronic obstructive pulmonary disease, unspecified: Secondary | ICD-10-CM

## 2015-07-22 DIAGNOSIS — R942 Abnormal results of pulmonary function studies: Secondary | ICD-10-CM | POA: Diagnosis not present

## 2015-07-22 NOTE — Telephone Encounter (Signed)
Called and spoke with [place] at [facility]. She is requesting Respimat instead of Inhaler Ok'd. Nothing further needed.

## 2015-07-25 ENCOUNTER — Ambulatory Visit (HOSPITAL_COMMUNITY): Payer: BLUE CROSS/BLUE SHIELD

## 2015-07-29 ENCOUNTER — Telehealth: Payer: Self-pay | Admitting: Pulmonary Disease

## 2015-07-29 ENCOUNTER — Encounter (HOSPITAL_COMMUNITY): Payer: BLUE CROSS/BLUE SHIELD

## 2015-07-29 NOTE — Telephone Encounter (Signed)
[place] calling to see if it would be okay to change to Respimat Combivent instead of Inhaler combivent.   ok'd change Nothing further needed.

## 2015-07-30 ENCOUNTER — Encounter (HOSPITAL_COMMUNITY)
Admission: RE | Admit: 2015-07-30 | Discharge: 2015-07-30 | Disposition: A | Discharge status: Home / Self Care | Payer: BLUE CROSS/BLUE SHIELD | Source: Ambulatory Visit | Attending: Cardiology | Admitting: Cardiology

## 2015-07-30 DIAGNOSIS — Z48812 Encounter for surgical aftercare following surgery on the circulatory system: Secondary | ICD-10-CM | POA: Insufficient documentation

## 2015-07-30 DIAGNOSIS — Z955 Presence of coronary angioplasty implant and graft: Secondary | ICD-10-CM | POA: Insufficient documentation

## 2015-07-31 ENCOUNTER — Encounter (HOSPITAL_COMMUNITY): Payer: BLUE CROSS/BLUE SHIELD

## 2015-08-02 ENCOUNTER — Encounter: Payer: Self-pay | Admitting: Pulmonary Disease

## 2015-08-02 ENCOUNTER — Encounter (HOSPITAL_COMMUNITY): Payer: BLUE CROSS/BLUE SHIELD

## 2015-08-05 ENCOUNTER — Encounter (HOSPITAL_COMMUNITY): Payer: BLUE CROSS/BLUE SHIELD

## 2015-08-05 NOTE — Telephone Encounter (Signed)
Per pt email sent: I have a question about CT CHEST HIGH RESOLUTION resulted on 07/22/15, 4:44 PMII.   I request you to let me know if l am free from all lung diseases including COPD and l do not need any treatment or further consultation  Thank you for the kind care. Kindly reply!  Dakota [name]   Please advise Dr. Lake Bells thanks

## 2015-08-07 ENCOUNTER — Encounter (HOSPITAL_COMMUNITY)
Admission: RE | Admit: 2015-08-07 | Discharge: 2015-08-07 | Disposition: A | Discharge status: Home / Self Care | Payer: BLUE CROSS/BLUE SHIELD | Source: Ambulatory Visit | Attending: Cardiology | Admitting: Cardiology

## 2015-08-07 DIAGNOSIS — Z48812 Encounter for surgical aftercare following surgery on the circulatory system: Secondary | ICD-10-CM | POA: Diagnosis not present

## 2015-08-07 DIAGNOSIS — Z955 Presence of coronary angioplasty implant and graft: Secondary | ICD-10-CM | POA: Diagnosis not present

## 2015-08-07 LAB — GLUCOSE, CAPILLARY
Glucose-Capillary: 188 mg/dL — ABNORMAL HIGH (ref 65–99)
Glucose-Capillary: 110 mg/dL — ABNORMAL HIGH (ref 65–99)

## 2015-08-07 NOTE — Progress Notes (Signed)
Pt started exercise today in the 11:15 a.m. Phase II cardiac rehab program.  Pt tolerated light exercise without difficulty. VSS, telemetry-SR with first degreee av block, BBB, PAC and Pvc, asymptomatic. This is present on his most recent 12 lead ekg.  Medication list reconciled based upon pt verbalizing the names of his medication.  Pt unsure of dosages without his list which is at home.  Pt verbalized compliance with medications and denies barriers to compliance. PSYCHOSOCIAL ASSESSMENT:  PHQ-0. Pt exhibits positive coping skills, hopeful outlook with supportive family which includes his daughter.  Pt is a widower for the last 5 years.No psychosocial needs identified at this time, no psychosocial interventions necessary.    Pt enjoys sitting outside and working with flowers in pots.   Pt cardiac rehab  goal is to walk without shortness of breath. Pt is under the care of pulmonary MD for COPD.Pt encouraged to participate in home exercise and purse lip breathing to increase ability to achieve these goals. Pt exercise by walking for 40 minutes 3 times a week.  Will advise pt during home exercise how he can increase his activity daily.  Pt long term cardiac rehab goal is increase strength, stamina and muscular strength. Will measure this with MET level progression, exercise tolerance and pt reporting. Pt oriented to exercise equipment and routine.  Understanding verbalized. Cherre Huger, BSN

## 2015-08-09 ENCOUNTER — Encounter (HOSPITAL_COMMUNITY): Payer: BLUE CROSS/BLUE SHIELD

## 2015-08-12 ENCOUNTER — Encounter (HOSPITAL_COMMUNITY)
Admission: RE | Admit: 2015-08-12 | Discharge: 2015-08-12 | Disposition: A | Discharge status: Home / Self Care | Payer: BLUE CROSS/BLUE SHIELD | Source: Ambulatory Visit | Attending: Cardiology | Admitting: Cardiology

## 2015-08-12 DIAGNOSIS — Z48812 Encounter for surgical aftercare following surgery on the circulatory system: Secondary | ICD-10-CM | POA: Diagnosis not present

## 2015-08-12 LAB — GLUCOSE, CAPILLARY
Glucose-Capillary: 108 mg/dL — ABNORMAL HIGH (ref 65–99)
Glucose-Capillary: 180 mg/dL — ABNORMAL HIGH (ref 65–99)

## 2015-08-14 ENCOUNTER — Encounter (HOSPITAL_COMMUNITY)
Admission: RE | Admit: 2015-08-14 | Discharge: 2015-08-14 | Disposition: A | Discharge status: Home / Self Care | Payer: BLUE CROSS/BLUE SHIELD | Source: Ambulatory Visit | Attending: Cardiology | Admitting: Cardiology

## 2015-08-14 DIAGNOSIS — Z48812 Encounter for surgical aftercare following surgery on the circulatory system: Secondary | ICD-10-CM | POA: Diagnosis not present

## 2015-08-14 LAB — GLUCOSE, CAPILLARY
GLUCOSE-CAPILLARY: 160 mg/dL — AB (ref 65–99)
GLUCOSE-CAPILLARY: 142 mg/dL — AB (ref 65–99)
Glucose-Capillary: 71 mg/dL (ref 65–99)

## 2015-08-14 NOTE — Progress Notes (Signed)
Pt in today for 11:15  cardiac rehab. Pt home blood glucose 138.  Pre exercise blood glucose 160.  After 30 minutes of exercise, pt blood glucose was 71.  Pt given banana and lemonade.  Recheck after 15 minutes -.  Will send note over to Dr. Osborne Casco for review.  Pt takes 1/2 of glipizide every morning.  Pt ate for breakfast one slice of wheat toast with peanut butter and one slice of wheat toast with cheese along with tea.  Advised pt that particularly on exercise days eat heavier by adding some fruit to his meal. Pt recheck 142.  Pt given additional peanut butter crackers for him to eat on the way to his errand and then home for lunch.  Pt denies any complaints. Cherre Huger, BSN

## 2015-08-15 ENCOUNTER — Other Ambulatory Visit: Payer: Self-pay | Admitting: Cardiology

## 2015-08-16 ENCOUNTER — Encounter (HOSPITAL_COMMUNITY): Payer: BLUE CROSS/BLUE SHIELD

## 2015-08-18 ENCOUNTER — Encounter: Payer: Self-pay | Admitting: Pulmonary Disease

## 2015-08-19 ENCOUNTER — Telehealth (HOSPITAL_COMMUNITY): Payer: Self-pay | Admitting: Internal Medicine

## 2015-08-19 ENCOUNTER — Encounter (HOSPITAL_COMMUNITY): Admission: RE | Admit: 2015-08-19 | Payer: BLUE CROSS/BLUE SHIELD | Source: Ambulatory Visit

## 2015-08-21 ENCOUNTER — Encounter (HOSPITAL_COMMUNITY)
Admission: RE | Admit: 2015-08-21 | Discharge: 2015-08-21 | Disposition: A | Discharge status: Home / Self Care | Payer: BLUE CROSS/BLUE SHIELD | Source: Ambulatory Visit | Attending: Cardiology | Admitting: Cardiology

## 2015-08-21 DIAGNOSIS — Z48812 Encounter for surgical aftercare following surgery on the circulatory system: Secondary | ICD-10-CM | POA: Diagnosis not present

## 2015-08-21 LAB — GLUCOSE, CAPILLARY
Glucose-Capillary: 241 mg/dL — ABNORMAL HIGH (ref 65–99)
Glucose-Capillary: 196 mg/dL — ABNORMAL HIGH (ref 65–99)

## 2015-08-23 ENCOUNTER — Encounter (HOSPITAL_COMMUNITY): Payer: BLUE CROSS/BLUE SHIELD

## 2015-08-26 ENCOUNTER — Encounter (HOSPITAL_COMMUNITY)
Admission: RE | Admit: 2015-08-26 | Discharge: 2015-08-26 | Disposition: A | Discharge status: Home / Self Care | Payer: BLUE CROSS/BLUE SHIELD | Source: Ambulatory Visit | Attending: Cardiology | Admitting: Cardiology

## 2015-08-26 DIAGNOSIS — Z48812 Encounter for surgical aftercare following surgery on the circulatory system: Secondary | ICD-10-CM | POA: Diagnosis not present

## 2015-08-26 LAB — GLUCOSE, CAPILLARY
Glucose-Capillary: 118 mg/dL — ABNORMAL HIGH (ref 65–99)
Glucose-Capillary: 113 mg/dL — ABNORMAL HIGH (ref 65–99)

## 2015-08-26 NOTE — Progress Notes (Signed)
Reviewed home exercise with pt today.  Pt plans to start walking at home for exercise.  He also has access to a gym within his complex to use for exercise during inclement weather.  Reviewed THR, pulse, RPE, sign and symptoms, NTG use, and when to call 911 or MD.  Pt voiced understanding.  Also reviewed QOL questionnaire with pt today.  Pt claims to have low energy and lots of worries.  He also struggles after losing his wife five years ago and with his daughter's health.  Will rescore QOL. Alberteen Sam, MA, ACSM RCEP

## 2015-08-28 ENCOUNTER — Encounter (HOSPITAL_COMMUNITY): Payer: BLUE CROSS/BLUE SHIELD

## 2015-08-30 ENCOUNTER — Encounter (HOSPITAL_COMMUNITY): Payer: BLUE CROSS/BLUE SHIELD

## 2015-09-02 ENCOUNTER — Encounter (HOSPITAL_COMMUNITY)
Admission: RE | Admit: 2015-09-02 | Discharge: 2015-09-02 | Disposition: A | Discharge status: Home / Self Care | Payer: BLUE CROSS/BLUE SHIELD | Source: Ambulatory Visit | Attending: Cardiology | Admitting: Cardiology

## 2015-09-02 DIAGNOSIS — Z955 Presence of coronary angioplasty implant and graft: Secondary | ICD-10-CM | POA: Diagnosis not present

## 2015-09-02 DIAGNOSIS — Z48812 Encounter for surgical aftercare following surgery on the circulatory system: Secondary | ICD-10-CM | POA: Insufficient documentation

## 2015-09-02 LAB — GLUCOSE, CAPILLARY
Glucose-Capillary: 196 mg/dL — ABNORMAL HIGH (ref 65–99)
Glucose-Capillary: 108 mg/dL — ABNORMAL HIGH (ref 65–99)

## 2015-09-04 ENCOUNTER — Encounter (HOSPITAL_COMMUNITY)
Admission: RE | Admit: 2015-09-04 | Discharge: 2015-09-04 | Disposition: A | Discharge status: Home / Self Care | Payer: BLUE CROSS/BLUE SHIELD | Source: Ambulatory Visit | Attending: Cardiology | Admitting: Cardiology

## 2015-09-04 DIAGNOSIS — Z48812 Encounter for surgical aftercare following surgery on the circulatory system: Secondary | ICD-10-CM | POA: Diagnosis not present

## 2015-09-04 LAB — GLUCOSE, CAPILLARY
GLUCOSE-CAPILLARY: 111 mg/dL — AB (ref 65–99)
Glucose-Capillary: 110 mg/dL — ABNORMAL HIGH (ref 65–99)

## 2015-09-04 NOTE — Progress Notes (Signed)
Dakota [name] 81 y.o. male Nutrition Note Spoke with pt. Nutrition Plan and Nutrition Survey goals reviewed with pt. Pt is following Step 2 of the Therapeutic Lifestyle Changes diet. Age-appropriate nutrition discussed. Pt is diabetic. No recent A1c noted. Pt reports his last A1c "was around 6.5 I think." This Probation officer went over Diabetes Education test results. Pt checks CBG's 5-6 times a day. Fasting CBG's reportedly 74-90 mg/dL. Pt states he has "low blood sugars almost daily after breakfast." Pt treats hypoglycemia with glucose tabs. Pt with dx of CHF. Per discussion, pt does not use canned/convenience foods. Pt rarely adds salt to food. Pt eats out infrequently.  Pt expressed understanding of the information reviewed. Pt aware of nutrition education classes offered. No results found for: HGBA1C  Nutrition Diagnosis ? Food-and nutrition-related knowledge deficit related to lack of exposure to information as related to diagnosis of: ? CVD ? DM  Nutrition RX/ Estimated Daily Nutrition Needs for: wt  maintenance 1800-2000 Kcal, 55-65 gm fat, 11-13 gm sat fat, 1.7-2.0 gm trans-fat, <1500 mg sodium, 250 gm CHO   Nutrition Intervention ? Pt's individual nutrition plan reviewed with pt. ? Benefits of adopting Therapeutic Lifestyle Changes discussed when Medficts reviewed. ? Pt to attend the Portion Distortion class ? Pt to attend the Diabetes Q & A class  ? Pt given handouts for: ? Nutrition I class ? Nutrition II class ? Diabetes Blitz Class ? Continue client-centered nutrition education by RD, as part of interdisciplinary care. Goal(s) ? CBG concentrations in the normal range or as close to normal as is safely possible. Monitor and Evaluate progress toward nutrition goal with team. Nutrition Risk: Change to Moderate Derek Mound, M.Ed, RD, LDN, CDE 09/04/2015 1:26 PM

## 2015-09-06 ENCOUNTER — Encounter (HOSPITAL_COMMUNITY): Payer: BLUE CROSS/BLUE SHIELD

## 2015-09-09 ENCOUNTER — Encounter (HOSPITAL_COMMUNITY)
Admission: RE | Admit: 2015-09-09 | Discharge: 2015-09-09 | Disposition: A | Discharge status: Home / Self Care | Payer: BLUE CROSS/BLUE SHIELD | Source: Ambulatory Visit | Attending: Cardiology | Admitting: Cardiology

## 2015-09-09 DIAGNOSIS — Z48812 Encounter for surgical aftercare following surgery on the circulatory system: Secondary | ICD-10-CM | POA: Diagnosis not present

## 2015-09-09 LAB — GLUCOSE, CAPILLARY
GLUCOSE-CAPILLARY: 160 mg/dL — AB (ref 65–99)
GLUCOSE-CAPILLARY: 90 mg/dL (ref 65–99)

## 2015-09-11 ENCOUNTER — Encounter (HOSPITAL_COMMUNITY): Payer: BLUE CROSS/BLUE SHIELD

## 2015-09-13 ENCOUNTER — Encounter (HOSPITAL_COMMUNITY): Admission: RE | Admit: 2015-09-13 | Payer: BLUE CROSS/BLUE SHIELD | Source: Ambulatory Visit

## 2015-09-16 ENCOUNTER — Encounter (HOSPITAL_COMMUNITY)
Admission: RE | Admit: 2015-09-16 | Discharge: 2015-09-16 | Disposition: A | Discharge status: Home / Self Care | Payer: BLUE CROSS/BLUE SHIELD | Source: Ambulatory Visit | Attending: Cardiology | Admitting: Cardiology

## 2015-09-16 DIAGNOSIS — Z48812 Encounter for surgical aftercare following surgery on the circulatory system: Secondary | ICD-10-CM | POA: Diagnosis not present

## 2015-09-16 LAB — GLUCOSE, CAPILLARY
GLUCOSE-CAPILLARY: 111 mg/dL — AB (ref 65–99)
Glucose-Capillary: 153 mg/dL — ABNORMAL HIGH (ref 65–99)

## 2015-09-18 ENCOUNTER — Encounter (HOSPITAL_COMMUNITY)
Admission: RE | Admit: 2015-09-18 | Discharge: 2015-09-18 | Disposition: A | Discharge status: Home / Self Care | Payer: BLUE CROSS/BLUE SHIELD | Source: Ambulatory Visit | Attending: Cardiology | Admitting: Cardiology

## 2015-09-18 DIAGNOSIS — Z48812 Encounter for surgical aftercare following surgery on the circulatory system: Secondary | ICD-10-CM | POA: Diagnosis not present

## 2015-09-18 LAB — GLUCOSE, CAPILLARY
GLUCOSE-CAPILLARY: 114 mg/dL — AB (ref 65–99)
Glucose-Capillary: 151 mg/dL — ABNORMAL HIGH (ref 65–99)

## 2015-09-23 ENCOUNTER — Encounter (HOSPITAL_COMMUNITY)
Admission: RE | Admit: 2015-09-23 | Discharge: 2015-09-23 | Disposition: A | Discharge status: Home / Self Care | Payer: BLUE CROSS/BLUE SHIELD | Source: Ambulatory Visit | Attending: Cardiology | Admitting: Cardiology

## 2015-09-23 DIAGNOSIS — Z48812 Encounter for surgical aftercare following surgery on the circulatory system: Secondary | ICD-10-CM | POA: Diagnosis not present

## 2015-09-23 LAB — GLUCOSE, CAPILLARY
GLUCOSE-CAPILLARY: 107 mg/dL — AB (ref 65–99)
Glucose-Capillary: 143 mg/dL — ABNORMAL HIGH (ref 65–99)

## 2015-09-25 ENCOUNTER — Encounter (HOSPITAL_COMMUNITY)
Admission: RE | Admit: 2015-09-25 | Discharge: 2015-09-25 | Disposition: A | Discharge status: Home / Self Care | Payer: BLUE CROSS/BLUE SHIELD | Source: Ambulatory Visit | Attending: Cardiology | Admitting: Cardiology

## 2015-09-25 DIAGNOSIS — Z48812 Encounter for surgical aftercare following surgery on the circulatory system: Secondary | ICD-10-CM | POA: Diagnosis not present

## 2015-09-25 LAB — GLUCOSE, CAPILLARY
GLUCOSE-CAPILLARY: 130 mg/dL — AB (ref 65–99)
GLUCOSE-CAPILLARY: 61 mg/dL — AB (ref 65–99)

## 2015-09-25 NOTE — Progress Notes (Signed)
Pt pre exercise blood glucose reading was 61.  Pt surprised because he had 2 pieces of french toast for breakfast.  Pt did not recall what his home cbg was this morning.  Pt given lemonade and banana.  Rechecked after 15 minutes 130.   Pt daughter called, Naydia.  She will return to pick pt up from rehab. Pt given peanut butter crackers to eat on the way home.  Pt will eat lunch upon arrival home.  Verbalizes understanding. Cherre Huger, BSN

## 2015-09-27 ENCOUNTER — Encounter (HOSPITAL_COMMUNITY): Payer: BLUE CROSS/BLUE SHIELD

## 2015-09-30 ENCOUNTER — Encounter (HOSPITAL_COMMUNITY)
Admission: RE | Admit: 2015-09-30 | Discharge: 2015-09-30 | Disposition: A | Discharge status: Home / Self Care | Payer: BLUE CROSS/BLUE SHIELD | Source: Ambulatory Visit | Attending: Cardiology | Admitting: Cardiology

## 2015-09-30 ENCOUNTER — Ambulatory Visit: Payer: BLUE CROSS/BLUE SHIELD | Admitting: Cardiology

## 2015-09-30 DIAGNOSIS — Z955 Presence of coronary angioplasty implant and graft: Secondary | ICD-10-CM | POA: Diagnosis not present

## 2015-09-30 DIAGNOSIS — Z48812 Encounter for surgical aftercare following surgery on the circulatory system: Secondary | ICD-10-CM | POA: Insufficient documentation

## 2015-09-30 LAB — GLUCOSE, CAPILLARY: GLUCOSE-CAPILLARY: 244 mg/dL — AB (ref 65–99)

## 2015-10-02 ENCOUNTER — Encounter (HOSPITAL_COMMUNITY): Payer: BLUE CROSS/BLUE SHIELD

## 2015-10-04 ENCOUNTER — Encounter (HOSPITAL_COMMUNITY)
Admission: RE | Admit: 2015-10-04 | Discharge: 2015-10-04 | Disposition: A | Discharge status: Home / Self Care | Payer: BLUE CROSS/BLUE SHIELD | Source: Ambulatory Visit | Attending: Cardiology | Admitting: Cardiology

## 2015-10-07 ENCOUNTER — Encounter (HOSPITAL_COMMUNITY)
Admission: RE | Admit: 2015-10-07 | Discharge: 2015-10-07 | Disposition: A | Discharge status: Home / Self Care | Payer: BLUE CROSS/BLUE SHIELD | Source: Ambulatory Visit | Attending: Cardiology | Admitting: Cardiology

## 2015-10-07 ENCOUNTER — Ambulatory Visit: Payer: BLUE CROSS/BLUE SHIELD | Admitting: Cardiology

## 2015-10-07 DIAGNOSIS — Z48812 Encounter for surgical aftercare following surgery on the circulatory system: Secondary | ICD-10-CM | POA: Diagnosis not present

## 2015-10-07 LAB — GLUCOSE, CAPILLARY
GLUCOSE-CAPILLARY: 172 mg/dL — AB (ref 65–99)
GLUCOSE-CAPILLARY: 108 mg/dL — AB (ref 65–99)

## 2015-10-09 ENCOUNTER — Encounter (HOSPITAL_COMMUNITY)
Admission: RE | Admit: 2015-10-09 | Discharge: 2015-10-09 | Disposition: A | Discharge status: Home / Self Care | Payer: BLUE CROSS/BLUE SHIELD | Source: Ambulatory Visit | Attending: Cardiology | Admitting: Cardiology

## 2015-10-09 DIAGNOSIS — Z48812 Encounter for surgical aftercare following surgery on the circulatory system: Secondary | ICD-10-CM | POA: Diagnosis not present

## 2015-10-09 LAB — GLUCOSE, CAPILLARY
GLUCOSE-CAPILLARY: 121 mg/dL — AB (ref 65–99)
Glucose-Capillary: 129 mg/dL — ABNORMAL HIGH (ref 65–99)

## 2015-10-11 ENCOUNTER — Encounter (HOSPITAL_COMMUNITY): Payer: BLUE CROSS/BLUE SHIELD

## 2015-10-14 ENCOUNTER — Encounter (HOSPITAL_COMMUNITY): Payer: BLUE CROSS/BLUE SHIELD

## 2015-10-16 ENCOUNTER — Encounter (HOSPITAL_COMMUNITY): Payer: BLUE CROSS/BLUE SHIELD

## 2015-10-18 ENCOUNTER — Encounter (HOSPITAL_COMMUNITY): Payer: BLUE CROSS/BLUE SHIELD

## 2015-10-23 ENCOUNTER — Encounter (HOSPITAL_COMMUNITY)
Admission: RE | Admit: 2015-10-23 | Discharge: 2015-10-23 | Disposition: A | Discharge status: Home / Self Care | Payer: BLUE CROSS/BLUE SHIELD | Source: Ambulatory Visit | Attending: Cardiology | Admitting: Cardiology

## 2015-10-23 DIAGNOSIS — Z48812 Encounter for surgical aftercare following surgery on the circulatory system: Secondary | ICD-10-CM | POA: Diagnosis not present

## 2015-10-23 LAB — GLUCOSE, CAPILLARY: Glucose-Capillary: 131 mg/dL — ABNORMAL HIGH (ref 65–99)

## 2015-10-25 ENCOUNTER — Encounter (HOSPITAL_COMMUNITY): Payer: BLUE CROSS/BLUE SHIELD

## 2015-10-30 ENCOUNTER — Encounter (HOSPITAL_COMMUNITY): Payer: BLUE CROSS/BLUE SHIELD

## 2015-11-01 ENCOUNTER — Encounter (HOSPITAL_COMMUNITY): Payer: BLUE CROSS/BLUE SHIELD

## 2015-11-04 ENCOUNTER — Encounter (HOSPITAL_COMMUNITY)
Admission: RE | Admit: 2015-11-04 | Discharge: 2015-11-04 | Disposition: A | Discharge status: Home / Self Care | Payer: BLUE CROSS/BLUE SHIELD | Source: Ambulatory Visit | Attending: Cardiology | Admitting: Cardiology

## 2015-11-04 DIAGNOSIS — Z955 Presence of coronary angioplasty implant and graft: Secondary | ICD-10-CM | POA: Insufficient documentation

## 2015-11-04 DIAGNOSIS — Z48812 Encounter for surgical aftercare following surgery on the circulatory system: Secondary | ICD-10-CM | POA: Insufficient documentation

## 2015-11-04 LAB — GLUCOSE, CAPILLARY: Glucose-Capillary: 179 mg/dL — ABNORMAL HIGH (ref 65–99)

## 2015-11-06 ENCOUNTER — Encounter (HOSPITAL_COMMUNITY)
Admission: RE | Admit: 2015-11-06 | Discharge: 2015-11-06 | Disposition: A | Discharge status: Home / Self Care | Payer: BLUE CROSS/BLUE SHIELD | Source: Ambulatory Visit | Attending: Cardiology | Admitting: Cardiology

## 2015-11-06 DIAGNOSIS — Z48812 Encounter for surgical aftercare following surgery on the circulatory system: Secondary | ICD-10-CM | POA: Diagnosis not present

## 2015-11-06 LAB — GLUCOSE, CAPILLARY: GLUCOSE-CAPILLARY: 212 mg/dL — AB (ref 65–99)

## 2015-11-08 ENCOUNTER — Encounter (HOSPITAL_COMMUNITY): Payer: BLUE CROSS/BLUE SHIELD

## 2015-11-11 ENCOUNTER — Encounter (HOSPITAL_COMMUNITY)
Admission: RE | Admit: 2015-11-11 | Discharge: 2015-11-11 | Disposition: A | Discharge status: Home / Self Care | Payer: BLUE CROSS/BLUE SHIELD | Source: Ambulatory Visit | Attending: Cardiology | Admitting: Cardiology

## 2015-11-11 DIAGNOSIS — Z48812 Encounter for surgical aftercare following surgery on the circulatory system: Secondary | ICD-10-CM | POA: Diagnosis not present

## 2015-11-11 LAB — GLUCOSE, CAPILLARY: GLUCOSE-CAPILLARY: 196 mg/dL — AB (ref 65–99)

## 2015-11-13 ENCOUNTER — Encounter (HOSPITAL_COMMUNITY): Payer: BLUE CROSS/BLUE SHIELD

## 2015-11-15 ENCOUNTER — Encounter (HOSPITAL_COMMUNITY): Payer: BLUE CROSS/BLUE SHIELD

## 2015-11-18 ENCOUNTER — Encounter (HOSPITAL_COMMUNITY): Payer: BLUE CROSS/BLUE SHIELD

## 2015-11-18 ENCOUNTER — Encounter (HOSPITAL_COMMUNITY)
Admission: RE | Admit: 2015-11-18 | Discharge: 2015-11-18 | Disposition: A | Discharge status: Home / Self Care | Payer: BLUE CROSS/BLUE SHIELD | Source: Ambulatory Visit | Attending: Cardiology | Admitting: Cardiology

## 2015-11-18 DIAGNOSIS — Z48812 Encounter for surgical aftercare following surgery on the circulatory system: Secondary | ICD-10-CM | POA: Diagnosis not present

## 2015-11-18 LAB — GLUCOSE, CAPILLARY
Glucose-Capillary: 88 mg/dL (ref 65–99)
Glucose-Capillary: 197 mg/dL — ABNORMAL HIGH (ref 65–99)

## 2015-11-20 ENCOUNTER — Encounter (HOSPITAL_COMMUNITY): Payer: BLUE CROSS/BLUE SHIELD

## 2015-11-22 ENCOUNTER — Encounter (HOSPITAL_COMMUNITY): Payer: BLUE CROSS/BLUE SHIELD

## 2015-11-25 ENCOUNTER — Encounter: Payer: Self-pay | Admitting: Cardiology

## 2015-11-25 ENCOUNTER — Ambulatory Visit (INDEPENDENT_AMBULATORY_CARE_PROVIDER_SITE_OTHER): Payer: BLUE CROSS/BLUE SHIELD | Admitting: Cardiology

## 2015-11-25 ENCOUNTER — Encounter (HOSPITAL_COMMUNITY)
Admission: RE | Admit: 2015-11-25 | Discharge: 2015-11-25 | Disposition: A | Discharge status: Home / Self Care | Payer: BLUE CROSS/BLUE SHIELD | Source: Ambulatory Visit | Attending: Cardiology | Admitting: Cardiology

## 2015-11-25 VITALS — BP 120/60 | HR 62 | Ht 66.0 in | Wt 130.8 lb

## 2015-11-25 DIAGNOSIS — R0602 Shortness of breath: Secondary | ICD-10-CM

## 2015-11-25 DIAGNOSIS — I447 Left bundle-branch block, unspecified: Secondary | ICD-10-CM | POA: Diagnosis not present

## 2015-11-25 DIAGNOSIS — I2510 Atherosclerotic heart disease of native coronary artery without angina pectoris: Secondary | ICD-10-CM

## 2015-11-25 DIAGNOSIS — Z951 Presence of aortocoronary bypass graft: Secondary | ICD-10-CM | POA: Diagnosis not present

## 2015-11-25 DIAGNOSIS — Z48812 Encounter for surgical aftercare following surgery on the circulatory system: Secondary | ICD-10-CM | POA: Diagnosis not present

## 2015-11-25 LAB — GLUCOSE, CAPILLARY: Glucose-Capillary: 194 mg/dL — ABNORMAL HIGH (ref 65–99)

## 2015-11-25 NOTE — Patient Instructions (Signed)
Medication Instructions:  Your physician recommends that you continue on your current medications as directed. Please refer to the Current Medication list given to you today.  Labwork: none  Testing/Procedures: none  Follow-Up: Your physician recommends that you schedule a follow-up appointment in: 4 month ov with Dr [name] at the [facility] office  If you need a refill on your cardiac medications before your next appointment, please call your pharmacy.

## 2015-11-25 NOTE — Progress Notes (Signed)
Cardiology Office Note   Date:  11/25/2015   ID:  Dakota [name], DOB 15-Jan-1926, MRN [id number]  PCP:  [name], MD  Cardiologist: Darlin Coco MD  Chief Complaint  Patient presents with  . routine follow up      History of Present Illness: Dakota [name] is a 82 y.o. male who presents for scheduled follow-up visit  Dakota [name] is a 82 y.o. male with a hx of CAD s/p CABG, combined systolic and diastolic CHF, LBBB, DM2. Last echo in 2013 demonstrated EF 99991111, mod diastolic dysfunction.   The patient was seen 7/14 with complaints of chest pain and shortness of breath. His primary care provider had recently seen him and documented hypoxia and placed him on oxygen.He was treated initially with antibiotic. His cough improved but he continued to have dyspnea. He underwent a Lexicographer. This was abnormal with suggestion of inferior and inferolateral ischemia.   We set him up with a cardiac catheterization which was performed 05/23/15. This demonstrated 99% distal S-OM graft disease which was treated with a Synergy DES. Procedure was complicated by R femoral artery pseudoaneurysm. This was partially thrombosed. He was DC and set up for a FU ultrasound last week. This demonstrated a 4.5 cm pseudoaneurysm. He was sent to the hospital for thrombin injection. He was evaluated by Dr. Gwenlyn Found. It was noted that the pseudoaneurysm was already thrombosed for the majority of the space with mild residual communication. Therefore thrombin injection was not performed.  The patient is not having any pain or discomfort when he walks.  Denies any further chest pain. He is very pleased with the results of his stent performed by Dr. [name]. He is not having any chest pain and his shortness of breath has resolved. He feels stronger and has more energy. He is anxious to get back to work. 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 The patient has finished the cardiac  rehabilitation program. The patient continues to  work at [employer] and he enjoys work.  Past Medical History  Diagnosis Date  . Smallpox   . Ischemic heart disease     a. s/p CABG;  b.  LHC 05/23/15:  S-RPAVB ok, S-D2 ok, S-OM2 99% (s/p Synergy DES), L-LAD ok, EF normal (complicated by pseudoaneurysm)   . Diabetes mellitus   . History of cardiovascular stress test     Lexiscan Myoview 7/16:  EF 51%, inferior, inferolateral ischemia; Intermediate Risk  . Coronary artery disease   . Anginal pain ([place])   . Hypertension   . Shortness of breath dyspnea   . GERD (gastroesophageal reflux disease)   . Arthritis     Past Surgical History  Procedure Laterality Date  . Hernia repair Bilateral 1979  . Appendectomy    . Foot surgery Right   . Coronary artery bypass graft  12/2003  . Doppler echocardiography  07/15/10    EF 40-45%, aortic stenosis  . Percutaneous coronary stent intervention (pci-s)  05/23/2015    des    ptca svg  . Cardiac catheterization  1992    normal  ([place])  . Cardiac catheterization N/A 05/23/2015    Procedure: Left Heart Cath and Cors/Grafts Angiography;  Surgeon: Troy Sine, MD;  Location: [facility];  Service: Cardiovascular;  Laterality: N/A;  . Cardiac catheterization N/A 05/23/2015    Procedure: Coronary Stent Intervention;  Surgeon: Troy Sine, MD;  Location: [facility];  Service: Cardiovascular;  Laterality: N/A;     Current Outpatient Prescriptions  Medication Sig Dispense Refill  . Acetaminophen (TYLENOL PO) Take 500 mg by mouth daily as needed (for pain).     [name] albuterol-ipratropium (COMBIVENT) 18-103 MCG/ACT inhaler Inhale 2 puffs into the lungs 4 (four) times daily as needed for wheezing. 1 Inhaler 2  . aspirin EC 81 MG tablet Take 81 mg by mouth daily.    Sarajane Marek Sodium 30-100 MG CAPS Take 100 mg by mouth daily.      . clopidogrel (PLAVIX) 75 MG tablet Take 1 tablet (75 mg total) by mouth daily with breakfast. 90  tablet 3  . furosemide (LASIX) 40 MG tablet Take 1 tablet (40 mg total) by mouth daily. 90 tablet 3  . glimepiride (AMARYL) 2 MG tablet Take 1 mg by mouth daily before breakfast.     . glucose 4 GM chewable tablet Chew 16 g by mouth as needed for low blood sugar.     . losartan (COZAAR) 50 MG tablet Take 0.5 tablets (25 mg total) by mouth daily. 45 tablet 2  . mirabegron ER (MYRBETRIQ) 50 MG TB24 tablet Take 50 mg by mouth daily.    . Multiple Vitamins-Minerals (PRESERVISION AREDS 2 PO) Take 2 tablets by mouth daily.     [name] NITROSTAT 0.4 MG SL tablet Place 1 tablet (0.4 mg total) under the tongue every 5 (five) minutes as needed (for chest pain). 75 tablet 2  . ONE TOUCH ULTRA TEST test strip 1 each by Other route as needed (glucose monoring).     . pantoprazole (PROTONIX) 40 MG tablet Take 1 tablet (40 mg total) by mouth daily. 90 tablet 3  . pravastatin (PRAVACHOL) 80 MG tablet Take 80 mg by mouth at bedtime.     [name] PROSCAR 5 MG tablet Take 5 mg by mouth daily.      Current Facility-Administered Medications  Medication Dose Route Frequency Provider Last Rate Last Dose  . HYDROmorphone (DILAUDID) injection 1 mg  1 mg Intravenous Q2H PRN Lorretta Harp, MD      . lidocaine (XYLOCAINE) 1 % (with pres) injection 10 mL  10 mL Intradermal Once Lorretta Harp, MD      . midazolam (VERSED) injection 2 mg  2 mg Intravenous Once Lorretta Harp, MD      . Normal Saline Flush 0.9 % SOLN   Intravenous Once Lorretta Harp, MD      . Povidone-Iodine Scrub Sponge (BETADINE) 10 % swab 1 each  1 each Topical Once Lorretta Harp, MD      . SOLUTION-PLUS STOPCOCK MISC   Does not apply Once Lorretta Harp, MD        Allergies:   Sulfa antibiotics; Pork-derived products; and Aceon    Social History:  The patient  reports that he has never smoked. He has never used smokeless tobacco. He reports that he does not drink alcohol or use illicit drugs.   Family History:  The patient's family history  includes Healthy in his father; Stroke in his mother; Ulcers in his mother.    ROS:  Please see the history of present illness.   Otherwise, review of systems are positive for none.   All other systems are reviewed and negative.    PHYSICAL EXAM: VS:  BP 120/60 mmHg  Pulse 62  Ht 5\' 6"  (1.676 m)  Wt 130 lb 12.8 oz (59.33 kg)  BMI 21.12 kg/m2 , BMI Body mass  index is 21.12 kg/(m^2). GEN: Well nourished, well developed, in no acute distress HEENT: normal Neck: no JVD, carotid bruits, or masses Cardiac: RRR; no murmurs, rubs, or gallops,no edema  Respiratory:  clear to auscultation bilaterally, normal work of breathing GI: soft, nontender, nondistended, + BS MS: no deformity or atrophy Skin: warm and dry, no rash Neuro:  Strength and sensation are intact Psych: euthymic mood, full affect   EKG:  EKG is not ordered today.    Recent Labs: 06/10/2015: BUN 24*; Creatinine, Ser 0.85; Hemoglobin 10.4*; Platelets 212.0; Potassium 4.6; Pro B Natriuretic peptide (BNP) 137.0*; Sodium 135    Lipid Panel No results found for: CHOL, TRIG, HDL, CHOLHDL, VLDL, LDLCALC, LDLDIRECT    Wt Readings from Last 3 Encounters:  11/25/15 130 lb 12.8 oz (59.33 kg)  07/30/15 129 lb 10.1 oz (58.8 kg)  07/17/15 133 lb (60.328 kg)        ASSESSMENT AND PLAN:  1.Coronary Artery Disease: He is s/p DES to the SVG-OM graft. Other grafts were patent. We discussed the need for dual antiplatelet Rx. Continue ASA, Plavix, ARB, statin. He has completed cardiac rehabilitation.  He has decided not to stay in the maintenance program..   2. Pseudoaneurysm: R groin appears stable. No further symptoms of pain. Patient is ambulating well.   3. Chronic Combined Systolic and Diastolic [id number] has improved. He has seen pulmonary. He had pulmonary function studies.  4. Hyperlipidemia: Continue statin.   5. Hypertension:BP stable on low dose losartan 25 mg daily   6.  Dyspnea:Improved..   Current medicines are reviewed at length with the patient today.  The patient does not have concerns regarding medicines.  The following changes have been made:  no change  Labs/ tests ordered today include:  No orders of the defined types were placed in this encounter.    Disposition: Continue current medication.  Recheck in 4 months for office visit with Dr. Debara Pickett at [facility], Darlin Coco MD 11/25/2015 5:08 PM    [address], [place], [place]  [address] Phone: [phone]; Fax: [phone]

## 2015-11-27 ENCOUNTER — Encounter (HOSPITAL_COMMUNITY): Admission: RE | Admit: 2015-11-27 | Payer: BLUE CROSS/BLUE SHIELD | Source: Ambulatory Visit

## 2015-11-27 ENCOUNTER — Telehealth (HOSPITAL_COMMUNITY): Payer: Self-pay | Admitting: *Deleted

## 2015-11-27 ENCOUNTER — Encounter (HOSPITAL_COMMUNITY): Payer: Self-pay | Admitting: *Deleted

## 2015-11-27 ENCOUNTER — Emergency Department (INDEPENDENT_AMBULATORY_CARE_PROVIDER_SITE_OTHER)
Admission: EM | Admit: 2015-11-27 | Discharge: 2015-11-27 | Disposition: A | Discharge status: Home / Self Care | Payer: BLUE CROSS/BLUE SHIELD | Source: Home / Self Care | Attending: Family Medicine | Admitting: Family Medicine

## 2015-11-27 DIAGNOSIS — M1712 Unilateral primary osteoarthritis, left knee: Secondary | ICD-10-CM | POA: Diagnosis not present

## 2015-11-27 MED ORDER — DICLOFENAC SODIUM 1 % TD GEL: 4.0000 g | Freq: Four times a day (QID) | TRANSDERMAL | Status: DC

## 2015-11-27 NOTE — Telephone Encounter (Signed)
Returned call to pt from message left this morning.  Pt unable to come to exercise today for last day due to knee pain. Pt plans to bring his homework to drop off on Monday or have his daughter drop it off for him.  Pt was very appreciative of the cardiac rehab staff. Cherre Huger, BSN

## 2015-11-27 NOTE — ED Provider Notes (Signed)
CSN: [id number]     Arrival date & time 11/27/15  1742 History   First MD Initiated Contact with Patient 11/27/15 1926     Chief Complaint  Patient presents with  . Knee Pain   (Consider location/radiation/quality/duration/timing/severity/associated sxs/prior Treatment) Patient is a 82 y.o. male presenting with knee pain. The history is provided by the patient and a relative.  Knee Pain Location:  Knee Time since incident:  1 day Injury: no   Knee location:  L knee Pain details:    Quality:  Sharp   Radiates to:  Does not radiate   Severity:  Mild   Onset quality:  Sudden   Duration:  1 day   Progression:  Unchanged Chronicity:  New Dislocation: no   Foreign body present:  No foreign bodies Prior injury to area:  No Relieved by:  None tried Worsened by:  Nothing tried Ineffective treatments:  None tried Associated symptoms: decreased ROM and stiffness   Risk factors: known bone disorder   Risk factors comment:  Arthritis   Past Medical History  Diagnosis Date  . Smallpox   . Ischemic heart disease     a. s/p CABG;  b.  LHC 05/23/15:  S-RPAVB ok, S-D2 ok, S-OM2 99% (s/p Synergy DES), L-LAD ok, EF normal (complicated by pseudoaneurysm)   . Diabetes mellitus   . History of cardiovascular stress test     Lexiscan Myoview 7/16:  EF 51%, inferior, inferolateral ischemia; Intermediate Risk  . Coronary artery disease   . Anginal pain ([place])   . Hypertension   . Shortness of breath dyspnea   . GERD (gastroesophageal reflux disease)   . Arthritis    Past Surgical History  Procedure Laterality Date  . Hernia repair Bilateral 1979  . Appendectomy    . Foot surgery Right   . Coronary artery bypass graft  12/2003  . Doppler echocardiography  07/15/10    EF 40-45%, aortic stenosis  . Percutaneous coronary stent intervention (pci-s)  05/23/2015    des    ptca svg  . Cardiac catheterization  1992    normal  ([place])  . Cardiac catheterization N/A 05/23/2015    Procedure: Left  Heart Cath and Cors/Grafts Angiography;  Surgeon: Troy Sine, MD;  Location: [facility];  Service: Cardiovascular;  Laterality: N/A;  . Cardiac catheterization N/A 05/23/2015    Procedure: Coronary Stent Intervention;  Surgeon: Troy Sine, MD;  Location: [facility];  Service: Cardiovascular;  Laterality: N/A;   Family History  Problem Relation Age of Onset  . Ulcers Mother   . Stroke Mother   . Healthy Father    Social History  Substance Use Topics  . Smoking status: Never Smoker   . Smokeless tobacco: Never Used  . Alcohol Use: No    Review of Systems  Constitutional: Negative.   Musculoskeletal: Positive for gait problem and stiffness. Negative for joint swelling.  Skin: Negative.   All other systems reviewed and are negative.   Allergies  Sulfa antibiotics; Pork-derived products; and Aceon  Home Medications   Prior to Admission medications   Medication Sig Start Date End Date Taking? Authorizing Provider  Acetaminophen (TYLENOL PO) Take 500 mg by mouth daily as needed (for pain).     Historical Provider, MD  albuterol-ipratropium [place]) 18-103 MCG/ACT inhaler Inhale 2 puffs into the lungs 4 (four) times daily as needed for wheezing. 07/17/15 07/16/16  Juanito Doom, MD  aspirin EC 81 MG tablet Take 81  mg by mouth daily.    Historical Provider, MD  Casanthranol-Docusate Sodium 30-100 MG CAPS Take 100 mg by mouth daily.      Historical Provider, MD  clopidogrel (PLAVIX) 75 MG tablet Take 1 tablet (75 mg total) by mouth daily with breakfast. 06/10/15   Liliane Shi, PA-C  diclofenac sodium (VOLTAREN) 1 % GEL Apply 4 g topically 4 (four) times daily. Please advise in dosing. 11/27/15   Billy Fischer, MD  furosemide (LASIX) 40 MG tablet Take 1 tablet (40 mg total) by mouth daily. 05/21/15   Liliane Shi, PA-C  glimepiride (AMARYL) 2 MG tablet Take 1 mg by mouth daily before breakfast.     Historical Provider, MD  glucose 4 GM chewable tablet Chew 16  g by mouth as needed for low blood sugar.     Historical Provider, MD  losartan (COZAAR) 50 MG tablet Take 0.5 tablets (25 mg total) by mouth daily. 08/15/15   Darlin Coco, MD  mirabegron ER (MYRBETRIQ) 50 MG TB24 tablet Take 50 mg by mouth daily.    Historical Provider, MD  Multiple Vitamins-Minerals (PRESERVISION AREDS 2 PO) Take 2 tablets by mouth daily.     Historical Provider, MD  NITROSTAT 0.4 MG SL tablet Place 1 tablet (0.4 mg total) under the tongue every 5 (five) minutes as needed (for chest pain). 05/25/15   Brett Canales, PA-C  ONE TOUCH ULTRA TEST test strip 1 each by Other route as needed (glucose monoring).  04/21/15   Historical Provider, MD  pantoprazole (PROTONIX) 40 MG tablet Take 1 tablet (40 mg total) by mouth daily. 05/21/15   Liliane Shi, PA-C  pravastatin (PRAVACHOL) 80 MG tablet Take 80 mg by mouth at bedtime.     Historical Provider, MD  PROSCAR 5 MG tablet Take 5 mg by mouth daily.  09/24/12   Historical Provider, MD   Meds Ordered and Administered this Visit  Medications - No data to display  BP 139/62 mmHg  Pulse 62  Temp(Src) 98.1 F (36.7 C) (Oral)  Resp 18  SpO2 98% No data found.   Physical Exam  Constitutional: He is oriented to person, place, and time. He appears well-developed and well-nourished. No distress.  Musculoskeletal: He exhibits tenderness.       Left knee: He exhibits deformity, abnormal patellar mobility and bony tenderness. He exhibits normal range of motion, no swelling, no effusion, no erythema and normal alignment. No patellar tendon tenderness noted.       Legs: Neurological: He is alert and oriented to person, place, and time.  Skin: Skin is warm and dry.  Nursing note and vitals reviewed.   ED Course  Procedures (including critical care time)  Labs Review Labs Reviewed - No data to display  Imaging Review No results found.   Visual Acuity Review  Right Eye Distance:   Left Eye Distance:   Bilateral Distance:     Right Eye Near:   Left Eye Near:    Bilateral Near:         MDM   1. Primary osteoarthritis of left knee     Meds ordered this encounter  Medications  . diclofenac sodium (VOLTAREN) 1 % GEL    Sig: Apply 4 g topically 4 (four) times daily. Please advise in dosing.    Dispense:  3 Tube    Refill:  0      Billy Fischer, MD 11/27/15 [phone]

## 2015-11-27 NOTE — ED Notes (Signed)
Pt  Reports  Pain l  Knee      Since  Yesterday  Afternoon      denys  Any  specefic  Injury       Pt  Uses  Cane   To  Assist  With ambulation

## 2015-11-29 ENCOUNTER — Encounter (HOSPITAL_COMMUNITY): Payer: BLUE CROSS/BLUE SHIELD

## 2016-01-03 ENCOUNTER — Emergency Department (INDEPENDENT_AMBULATORY_CARE_PROVIDER_SITE_OTHER)
Admission: EM | Admit: 2016-01-03 | Discharge: 2016-01-03 | Disposition: A | Discharge status: Home / Self Care | Payer: BLUE CROSS/BLUE SHIELD | Source: Home / Self Care | Attending: Emergency Medicine | Admitting: Emergency Medicine

## 2016-01-03 ENCOUNTER — Emergency Department (INDEPENDENT_AMBULATORY_CARE_PROVIDER_SITE_OTHER): Payer: BLUE CROSS/BLUE SHIELD

## 2016-01-03 ENCOUNTER — Encounter (HOSPITAL_COMMUNITY): Payer: Self-pay | Admitting: Emergency Medicine

## 2016-01-03 DIAGNOSIS — J40 Bronchitis, not specified as acute or chronic: Secondary | ICD-10-CM

## 2016-01-03 MED ORDER — PREDNISONE 50 MG PO TABS: ORAL_TABLET | ORAL | Status: DC

## 2016-01-03 MED ORDER — ALBUTEROL SULFATE HFA 108 (90 BASE) MCG/ACT IN AERS: 2.0000 | INHALATION_SPRAY | RESPIRATORY_TRACT | Status: AC | PRN

## 2016-01-03 MED ORDER — HYDROCODONE-HOMATROPINE 5-1.5 MG/5ML PO SYRP: 5.0000 mL | ORAL_SOLUTION | Freq: Four times a day (QID) | ORAL | Status: DC | PRN

## 2016-01-03 MED ORDER — AZITHROMYCIN 250 MG PO TABS: ORAL_TABLET | ORAL | Status: DC

## 2016-01-03 NOTE — ED Provider Notes (Signed)
CSN: [id number]     Arrival date & time 01/03/16  1843 History   First MD Initiated Contact with Patient 01/03/16 2016     Chief Complaint  Patient presents with  . URI   (Consider location/radiation/quality/duration/timing/severity/associated sxs/prior Treatment) HPI  He is an 82 year old man here with his daughter for evaluation of congestion. He states today he started with nasal congestion, runny nose, sneezing, and cough. He also reports body aches. No known fever. No nausea or vomiting. His daughter has heard some wheezing. He does report feeling short of breath.  Past Medical History  Diagnosis Date  . Smallpox   . Ischemic heart disease     a. s/p CABG;  b.  LHC 05/23/15:  S-RPAVB ok, S-D2 ok, S-OM2 99% (s/p Synergy DES), L-LAD ok, EF normal (complicated by pseudoaneurysm)   . Diabetes mellitus   . History of cardiovascular stress test     Lexiscan Myoview 7/16:  EF 51%, inferior, inferolateral ischemia; Intermediate Risk  . Coronary artery disease   . Anginal pain ([place])   . Hypertension   . Shortness of breath dyspnea   . GERD (gastroesophageal reflux disease)   . Arthritis    Past Surgical History  Procedure Laterality Date  . Hernia repair Bilateral 1979  . Appendectomy    . Foot surgery Right   . Coronary artery bypass graft  12/2003  . Doppler echocardiography  07/15/10    EF 40-45%, aortic stenosis  . Percutaneous coronary stent intervention (pci-s)  05/23/2015    des    ptca svg  . Cardiac catheterization  1992    normal  ([place])  . Cardiac catheterization N/A 05/23/2015    Procedure: Left Heart Cath and Cors/Grafts Angiography;  Surgeon: Troy Sine, MD;  Location: [facility];  Service: Cardiovascular;  Laterality: N/A;  . Cardiac catheterization N/A 05/23/2015    Procedure: Coronary Stent Intervention;  Surgeon: Troy Sine, MD;  Location: [facility];  Service: Cardiovascular;  Laterality: N/A;   Family History  Problem Relation Age of  Onset  . Ulcers Mother   . Stroke Mother   . Healthy Father    Social History  Substance Use Topics  . Smoking status: Never Smoker   . Smokeless tobacco: Never Used  . Alcohol Use: No    Review of Systems As in history of present illness Allergies  Sulfa antibiotics; Pork-derived products; and Aceon  Home Medications   Prior to Admission medications   Medication Sig Start Date End Date Taking? Authorizing Provider  Acetaminophen (TYLENOL PO) Take 500 mg by mouth daily as needed (for pain).     Historical Provider, MD  albuterol (PROVENTIL HFA;VENTOLIN HFA) 108 (90 Base) MCG/ACT inhaler Inhale 2 puffs into the lungs every 4 (four) hours as needed for wheezing or shortness of breath. 01/03/16   Melony Overly, MD  albuterol-ipratropium (COMBIVENT) 18-103 MCG/ACT inhaler Inhale 2 puffs into the lungs 4 (four) times daily as needed for wheezing. 07/17/15 07/16/16  Juanito Doom, MD  aspirin EC 81 MG tablet Take 81 mg by mouth daily.    Historical Provider, MD  azithromycin (ZITHROMAX Z-PAK) 250 MG tablet Take 2 pills today, then 1 pill daily until gone. 01/03/16   Melony Overly, MD  Casanthranol-Docusate Sodium 30-100 MG CAPS Take 100 mg by mouth daily.      Historical Provider, MD  clopidogrel (PLAVIX) 75 MG tablet Take 1 tablet (75 mg total) by mouth daily with breakfast. 06/10/15  Liliane Shi, PA-C  diclofenac sodium (VOLTAREN) 1 % GEL Apply 4 g topically 4 (four) times daily. Please advise in dosing. 11/27/15   Billy Fischer, MD  furosemide (LASIX) 40 MG tablet Take 1 tablet (40 mg total) by mouth daily. 05/21/15   Liliane Shi, PA-C  glimepiride (AMARYL) 2 MG tablet Take 1 mg by mouth daily before breakfast.     Historical Provider, MD  glucose 4 GM chewable tablet Chew 16 g by mouth as needed for low blood sugar.     Historical Provider, MD  HYDROcodone-homatropine (HYCODAN) 5-1.5 MG/5ML syrup Take 5 mLs by mouth every 6 (six) hours as needed for cough. 01/03/16   Melony Overly, MD   losartan (COZAAR) 50 MG tablet Take 0.5 tablets (25 mg total) by mouth daily. 08/15/15   Darlin Coco, MD  mirabegron ER (MYRBETRIQ) 50 MG TB24 tablet Take 50 mg by mouth daily.    Historical Provider, MD  Multiple Vitamins-Minerals (PRESERVISION AREDS 2 PO) Take 2 tablets by mouth daily.     Historical Provider, MD  NITROSTAT 0.4 MG SL tablet Place 1 tablet (0.4 mg total) under the tongue every 5 (five) minutes as needed (for chest pain). 05/25/15   Brett Canales, PA-C  ONE TOUCH ULTRA TEST test strip 1 each by Other route as needed (glucose monoring).  04/21/15   Historical Provider, MD  pantoprazole (PROTONIX) 40 MG tablet Take 1 tablet (40 mg total) by mouth daily. 05/21/15   Liliane Shi, PA-C  pravastatin (PRAVACHOL) 80 MG tablet Take 80 mg by mouth at bedtime.     Historical Provider, MD  predniSONE (DELTASONE) 50 MG tablet Take 1 pill daily for 5 days. 01/03/16   Melony Overly, MD  PROSCAR 5 MG tablet Take 5 mg by mouth daily.  09/24/12   Historical Provider, MD   Meds Ordered and Administered this Visit  Medications - No data to display  BP 135/83 mmHg  Pulse 66  Temp(Src) 97.7 F (36.5 C) (Oral)  Resp 20  SpO2 99% No data found.   Physical Exam  Constitutional: He is oriented to person, place, and time. He appears well-developed and well-nourished. No distress.  HENT:  Mouth/Throat: Oropharynx is clear and moist. No oropharyngeal exudate.  Nasal mucosa is erythematous with discharge present  Neck: Neck supple.  Cardiovascular: Normal rate, regular rhythm and normal heart sounds.   No murmur heard. Pulmonary/Chest: Effort normal. No respiratory distress. He has no wheezes. He has no rales.  Focal rhonchi in left mid lung  Neurological: He is alert and oriented to person, place, and time.    ED Course  Procedures (including critical care time)  Labs Review Labs Reviewed - No data to display  Imaging Review Dg Chest 2 View  01/03/2016  CLINICAL DATA:  Chills, body  aches, cough, shortness of breath EXAM: CHEST  2 VIEW COMPARISON:  High-resolution CT chest dated 07/22/2015 FINDINGS: Lungs are clear.  No pleural effusion or pneumothorax. Cardiomegaly.  Postsurgical changes related to prior CABG. Mild degenerative changes of the visualized thoracolumbar spine. Median sternotomy. IMPRESSION: No evidence of acute cardiopulmonary disease. Electronically Signed   By: Julian Hy M.D.   On: 01/03/2016 20:34     MDM   1. Bronchitis    X-ray negative for pneumonia. He is afebrile and oxygen saturation is 99%. He will get a little short of breath at times, but has normal work of breathing. He is stable for outpatient treatment.  We'll treat with prednisone and azithromycin. Hycodan as needed for cough. Albuterol as needed for wheezing. Return precautions reviewed.      Melony Overly, MD 01/03/16 2045

## 2016-01-03 NOTE — ED Notes (Signed)
Chest congestion and sinus drainage for 3 days.  Patient did get a flu shot at [facility] yesterday.  Patient complains of cough, chest and sinus congestion , runny nose, sneezing, and body aches

## 2016-01-03 NOTE — Discharge Instructions (Signed)
You have bronchitis. Take azithromcyin and prednisone as prescribed. Use hycodan as needed for cough. Use the albuterol every 4 hours as needed for wheezing or cough. You should see improvement in the next 2 days If you develop fevers, difficulty breathing, or are just not getting better, please come back or go to the emergency room.

## 2016-03-22 ENCOUNTER — Other Ambulatory Visit: Payer: Self-pay | Admitting: Physician Assistant

## 2016-03-24 ENCOUNTER — Ambulatory Visit: Payer: BLUE CROSS/BLUE SHIELD | Admitting: Internal Medicine

## 2016-03-24 ENCOUNTER — Ambulatory Visit (INDEPENDENT_AMBULATORY_CARE_PROVIDER_SITE_OTHER): Payer: BLUE CROSS/BLUE SHIELD | Admitting: Cardiovascular Disease

## 2016-03-24 ENCOUNTER — Encounter: Payer: Self-pay | Admitting: Cardiovascular Disease

## 2016-03-24 ENCOUNTER — Ambulatory Visit: Payer: BLUE CROSS/BLUE SHIELD | Admitting: Cardiovascular Disease

## 2016-03-24 VITALS — BP 140/70 | HR 72 | Ht 66.0 in | Wt 129.0 lb

## 2016-03-24 DIAGNOSIS — I2510 Atherosclerotic heart disease of native coronary artery without angina pectoris: Secondary | ICD-10-CM | POA: Diagnosis not present

## 2016-03-24 NOTE — Patient Instructions (Signed)
Your physician wants you to follow-up in: 4-5 months or sooner if needed. You will receive a reminder letter in the mail two months in advance. If you don't receive a letter, please call our office to schedule the follow-up appointment.

## 2016-03-24 NOTE — Telephone Encounter (Signed)
clopidogrel (PLAVIX) 75 MG tablet  Medication   Date: 06/10/2015  Department: [address] [facility]  Ordering/Authorizing: Liliane Shi, PA-C      Order Providers    Prescribing Provider Encounter Provider   Liliane Shi, PA-C Liliane Shi, PA-C    Supervision Information    Supervising Provider Type of Supervision   Dorothy Spark, MD Incident To    Medication Detail      Disp Refills Start End     clopidogrel (PLAVIX) 75 MG tablet 90 tablet 3 06/10/2015     Sig - Route: Take 1 tablet (75 mg total) by mouth daily with breakfast. - Oral    Notes to Pharmacy: Change to 90 day supply. Disregard previous prescription    E-Prescribing Status: Receipt confirmed by pharmacy (06/10/2015 3:06 PM EDT)     Associated Diagnoses    Coronary artery disease involving native coronary artery of native heart without angina pectoris - Primary       Pharmacy    [facility] [place], [place] - [id number]   Has refills until 05/2016

## 2016-03-26 ENCOUNTER — Encounter: Payer: Self-pay | Admitting: Cardiovascular Disease

## 2016-03-26 NOTE — Progress Notes (Signed)
Patient ID: Dakota [name], male   DOB: 15-Apr-1926, 82 y.o.   MRN: [id number]    Primary M.D.: Dr. Kandace Blitz  HPI: Dakota [name] is a 82 y.o. male is a former patient of Dr. Mare Ferrari who presents to the office today to establish cardiology cardiology care with me.  Dakota [name] is an 82 year old who had undergone CABG revascularization surgery in 2005 with a LIMA to the LAD, SVG to the diagonal, SVG to the obtuse marginal, and SVG to his distal RCA.  He has been demonstrated to have combined systolic and diastolic heart failure, left bundle branch block and diabetes mellitus.  In 2013.  An echo Doppler study showed an EF of 50-55% with moderate diastolic dysfunction.  In July 2016.  He developed symptoms worrisome for unstable angina.  A nuclear perfusion study was abnormal and demonstrated inferior to inferolateral ischemia.  He underwent cardiac catheterization by me on 05/21/2015.  This revealed low normal global LV function with an EF of 50-55%.  There was significant native CAD with 95% distal left main stenosis, proximal occlusion of the LAD after the first 2 septal perforating arteries, total occlusion of the circumflex at the ostium, and RCA stenoses, 50% proximally, 50% mid, and total occlusion of the distal RCA after a small PDA-like vessel.  He had a patent LIMA graft supplying the mid LAD.  The vein graft supplying the circumflex marginal had a 99% distal anastomosis stenosis.  He had a patent vein graft supplying the diagonal vessel and a pain vein graft supplying the distal RCA with some collateralization of the AV groove circumflex from the PLA vessel.  That time I performed successful intervention to the vein graft supplying the obtuse marginal vessel and a 3.016 mm Synergy DES stent postdilated to 3.43 mm was inserted with resumption of brisk TIMI-3 flow and 0 residual stenosis.  This procedure was complicated by a small pseudoaneurysm which essentially self thrombosed and he did not require  any thrombin injection.  He last saw Dr. Mare Ferrari in January 2017.  The patient has remained fairly active.  He still works at [employer] 4 days per week for 8 hours shifts.  There is no recurrent chest pain.  There is some occasional ankle swelling.  He presents to establish cardiology care with me.  Past Medical History  Diagnosis Date  . Smallpox   . Ischemic heart disease     a. s/p CABG;  b.  LHC 05/23/15:  S-RPAVB ok, S-D2 ok, S-OM2 99% (s/p Synergy DES), L-LAD ok, EF normal (complicated by pseudoaneurysm)   . Diabetes mellitus   . History of cardiovascular stress test     Lexiscan Myoview 7/16:  EF 51%, inferior, inferolateral ischemia; Intermediate Risk  . Coronary artery disease   . Anginal pain ([place])   . Hypertension   . Shortness of breath dyspnea   . GERD (gastroesophageal reflux disease)   . Arthritis     Past Surgical History  Procedure Laterality Date  . Hernia repair Bilateral 1979  . Appendectomy    . Foot surgery Right   . Coronary artery bypass graft  12/2003  . Doppler echocardiography  07/15/10    EF 40-45%, aortic stenosis  . Percutaneous coronary stent intervention (pci-s)  05/23/2015    des    ptca svg  . Cardiac catheterization  1992    normal  ([place])  . Cardiac catheterization N/A 05/23/2015    Procedure: Left Heart Cath and Cors/Grafts Angiography;  Surgeon: Joyice Faster  [name], MD;  Location: [place] [facility];  Service: Cardiovascular;  Laterality: N/A;  . Cardiac catheterization N/A 05/23/2015    Procedure: Coronary Stent Intervention;  Surgeon: Troy Sine, MD;  Location: [place] [facility];  Service: Cardiovascular;  Laterality: N/A;    Allergies  Allergen Reactions  . Sulfa Antibiotics Swelling  . Pork-Derived Products     muslim  . Aceon [Perindopril Erbumine] Cough    Current Outpatient Prescriptions  Medication Sig Dispense Refill  . Acetaminophen (TYLENOL PO) Take 500 mg by mouth daily as needed (for pain).     [name] albuterol (PROVENTIL  HFA;VENTOLIN HFA) 108 (90 Base) MCG/ACT inhaler Inhale 2 puffs into the lungs every 4 (four) hours as needed for wheezing or shortness of breath. 1 Inhaler 0  . albuterol-ipratropium (COMBIVENT) 18-103 MCG/ACT inhaler Inhale 2 puffs into the lungs 4 (four) times daily as needed for wheezing. 1 Inhaler 2  . aspirin EC 81 MG tablet Take 81 mg by mouth daily.    Sarajane Marek Sodium 30-100 MG CAPS Take 100 mg by mouth daily.      . clopidogrel (PLAVIX) 75 MG tablet Take 1 tablet (75 mg total) by mouth daily with breakfast. 90 tablet 3  . furosemide (LASIX) 40 MG tablet Take 1 tablet (40 mg total) by mouth daily. 90 tablet 3  . glimepiride (AMARYL) 2 MG tablet Take 1 mg by mouth daily before breakfast.     . glucose 4 GM chewable tablet Chew 16 g by mouth as needed for low blood sugar.     . losartan (COZAAR) 50 MG tablet Take 0.5 tablets (25 mg total) by mouth daily. 45 tablet 2  . mirabegron ER (MYRBETRIQ) 50 MG TB24 tablet Take 50 mg by mouth daily.    . Multiple Vitamins-Minerals (PRESERVISION AREDS 2 PO) Take 2 tablets by mouth daily.     [name] NITROSTAT 0.4 MG SL tablet Place 1 tablet (0.4 mg total) under the tongue every 5 (five) minutes as needed (for chest pain). 75 tablet 2  . ONE TOUCH ULTRA TEST test strip 1 each by Other route as needed (glucose monoring).     . pantoprazole (PROTONIX) 40 MG tablet Take 1 tablet (40 mg total) by mouth daily. 90 tablet 3  . pravastatin (PRAVACHOL) 80 MG tablet Take 80 mg by mouth at bedtime.     [name] PROSCAR 5 MG tablet Take 5 mg by mouth daily.     . clopidogrel (PLAVIX) 75 MG tablet TAKE ONE TABLET BY MOUTH ONCE DAILY WITH  BREAKFAST 90 tablet 3   Current Facility-Administered Medications  Medication Dose Route Frequency Provider Last Rate Last Dose  . HYDROmorphone (DILAUDID) injection 1 mg  1 mg Intravenous Q2H PRN Lorretta Harp, MD      . lidocaine (XYLOCAINE) 1 % (with pres) injection 10 mL  10 mL Intradermal Once Lorretta Harp, MD      .  midazolam (VERSED) injection 2 mg  2 mg Intravenous Once Lorretta Harp, MD      . Normal Saline Flush 0.9 % SOLN   Intravenous Once Lorretta Harp, MD      . Povidone-Iodine Scrub Sponge (BETADINE) 10 % swab 1 each  1 each Topical Once Lorretta Harp, MD      . SOLUTION-PLUS STOPCOCK MISC   Does not apply Once Lorretta Harp, MD        Social History   Social History  . Marital Status: Married  Spouse Name: N/A  . Number of Children: 2 d  . Years of Education: N/A   Occupational History  . accountant     [employer]   Social History Main Topics  . Smoking status: Never Smoker   . Smokeless tobacco: Never Used  . Alcohol Use: No  . Drug Use: No  . Sexual Activity: Not on file   Other Topics Concern  . Not on file   Social History Narrative    Additional social history is notable in that he had worked at the [employer] for 30 years in the [place].  Family History  Problem Relation Age of Onset  . Ulcers Mother   . Stroke Mother   . Healthy Father    Both parents are deceased.  ROS General: Negative; No fevers, chills, or night sweats HEENT: Negative; No changes in vision or hearing, sinus congestion, difficulty swallowing Pulmonary: Negative; No cough, wheezing, shortness of breath, hemoptysis Cardiovascular: See HPI: No chest pain, presyncope, syncope, palpatations GI: Negative; No nausea, vomiting, diarrhea, or abdominal pain GU: Negative; No dysuria, hematuria, or difficulty voiding Musculoskeletal: Negative; no myalgias, joint pain, or weakness Hematologic: Negative; no easy bruising, bleeding Endocrine: Negative; no heat/cold intolerance; no diabetes, Neuro: Negative; no changes in balance, headaches Skin: Negative; No rashes or skin lesions Psychiatric: Negative; No behavioral problems, depression Sleep: Negative; No snoring,  daytime sleepiness, hypersomnolence, bruxism, restless legs, hypnogognic hallucinations. Other  comprehensive 14 point system review is negative   Physical Exam BP 140/70 mmHg  Pulse 72  Ht _0  (1.676 m)  Wt 129 lb (58.514 kg)  BMI 20.83 kg/m2   Repeat blood pressure by me 122/70.  Wt Readings from Last 3 Encounters:  03/24/16 129 lb (58.514 kg)  11/25/15 130 lb 12.8 oz (59.33 kg)  07/30/15 129 lb 10.1 oz (58.8 kg)   General: Alert, oriented, no distress.  Skin: normal turgor, no rashes, warm and dry HEENT: Normocephalic, atraumatic. Pupils equal round and reactive to light; sclera anicteric; extraocular muscles intact, No lid lag; Nose without nasal septal hypertrophy; Mouth/Parynx benign; Mallinpatti scale 2 Neck: No JVD, no carotid bruits; normal carotid upstroke Lungs: clear to ausculatation and percussion bilaterally; no wheezing or rales, normal inspiratory and expiratory effort Chest wall: without tenderness to palpitation Heart: PMI not displaced, RRR, s1 s2 normal, 1/6 systolic murmur, No diastolic murmur, no rubs, gallops, thrills, or heaves Abdomen: soft, nontender; no hepatosplenomehaly, BS+; abdominal aorta nontender and not dilated by palpation. Back: no CVA tenderness Pulses: 2+  Musculoskeletal: full range of motion, normal strength, no joint deformities Extremities: Pulses 2+, no clubbing cyanosis or edema, Homan's sign negative  Neurologic: grossly nonfocal; Cranial nerves grossly wnl Psychologic: Normal mood and affect   ECG (independently read by me): Normal sinus rhythm at 72 bpm with first-degree AV block.  PR interval 234 ms.  LABS:  BMP Latest Ref Rng 06/10/2015 05/24/2015 05/21/2015  Glucose 70 - 99 mg/dL 123(H) 104(H) 153(H)  BUN 6 - 23 mg/dL 24(H) 13 17  Creatinine 0.40 - 1.50 mg/dL 0.85 0.66 0.75  Sodium 135 - 145 mEq/L 135 137 137  Potassium 3.5 - 5.1 mEq/L 4.6 4.4 4.4  Chloride 96 - 112 mEq/L 100 104 103  CO2 19 - 32 mEq/L _1 Calcium 8.4 - 10.5 mg/dL 9.1 8.7(L) 8.8     Hepatic Function Latest Ref Rng 02/13/2014  Total Protein  6.0 - 8.3 g/dL 7.0  Albumin 3.5 - 5.2 g/dL 3.8  AST  0 - 37 U/L 19  ALT 0 - 53 U/L 14  Alk Phosphatase 39 - 117 U/L 54  Total Bilirubin 0.3 - 1.2 mg/dL 0.8  Bilirubin, Direct 0.0 - 0.3 mg/dL 0.1    CBC Latest Ref Rng 06/10/2015 05/24/2015 05/21/2015  WBC 4.0 - 10.5 K/uL 8.6 7.7 7.0  Hemoglobin 13.0 - 17.0 g/dL 10.4(L) 10.0(L) 10.6(L)  Hematocrit 39.0 - 52.0 % 33.9(L) 30.5(L) 33.4(L)  Platelets 150.0 - 400.0 K/uL 212.0 154 176.0   Lab Results  Component Value Date   MCV 61.0* 06/10/2015   MCV 57.5* 05/24/2015   MCV 60.2 Repeated and verified X2.* 05/21/2015    No results found for: TSH  BNP No results found for: BNP  ProBNP    Component Value Date/Time   PROBNP 137.0* 06/10/2015 1504     Lipid Panel  No results found for: CHOL, TRIG, HDL, CHOLHDL, VLDL, LDLCALC, LDLDIRECT   RADIOLOGY: No results found.    ASSESSMENT AND PLAN: Dakota [name] is an 82 year old gentleman who underwent CABG surgery in 2005.  He developed symptom complex suggestive of unstable angina and in July 2016 was found to have a 99% stenosis in the vein graft supplying the circumflex marginal vessel just at the distal anastomosis.  This was successfully stented.  He has not had any recurrent anginal therapy since and continues to feel well.  He continues to be on dual antiplatelet therapy with aspirin and Plavix.  His blood pressure today is stable on losartan 25 mg furosemide 40 mg he does not have any residual edema.  He is on pravastatin 80 mg for hyperlipidemia with target LDL less than 70.  He is diabetic on Amaryl.  He also has a history of GERD, which is controlled with pantoprazole.  I reviewed his office visits from Dr. Mare Ferrari.  His previous right groin pseudoaneurysm site appears stable.  He has first-degree AV block.  As long as he continues to do well, I will see him in 4-6 months for follow-up evaluation.  Troy Sine, MD, [facility]  03/26/2016 10:29 PM

## 2016-06-08 ENCOUNTER — Other Ambulatory Visit: Payer: Self-pay | Admitting: *Deleted

## 2016-06-08 MED ORDER — NITROSTAT 0.4 MG SL SUBL: 0.4000 mg | SUBLINGUAL_TABLET | SUBLINGUAL | 1 refills | Status: DC | PRN

## 2016-07-09 IMAGING — DX DG CHEST 2V
2 series · 2 of 2 positions shown · non-contrast
Comparison: 12/16/2011

CLINICAL DATA: Cough, fever, shortness of breath x1 week

EXAM:
CHEST  2 VIEW

[chest pa]
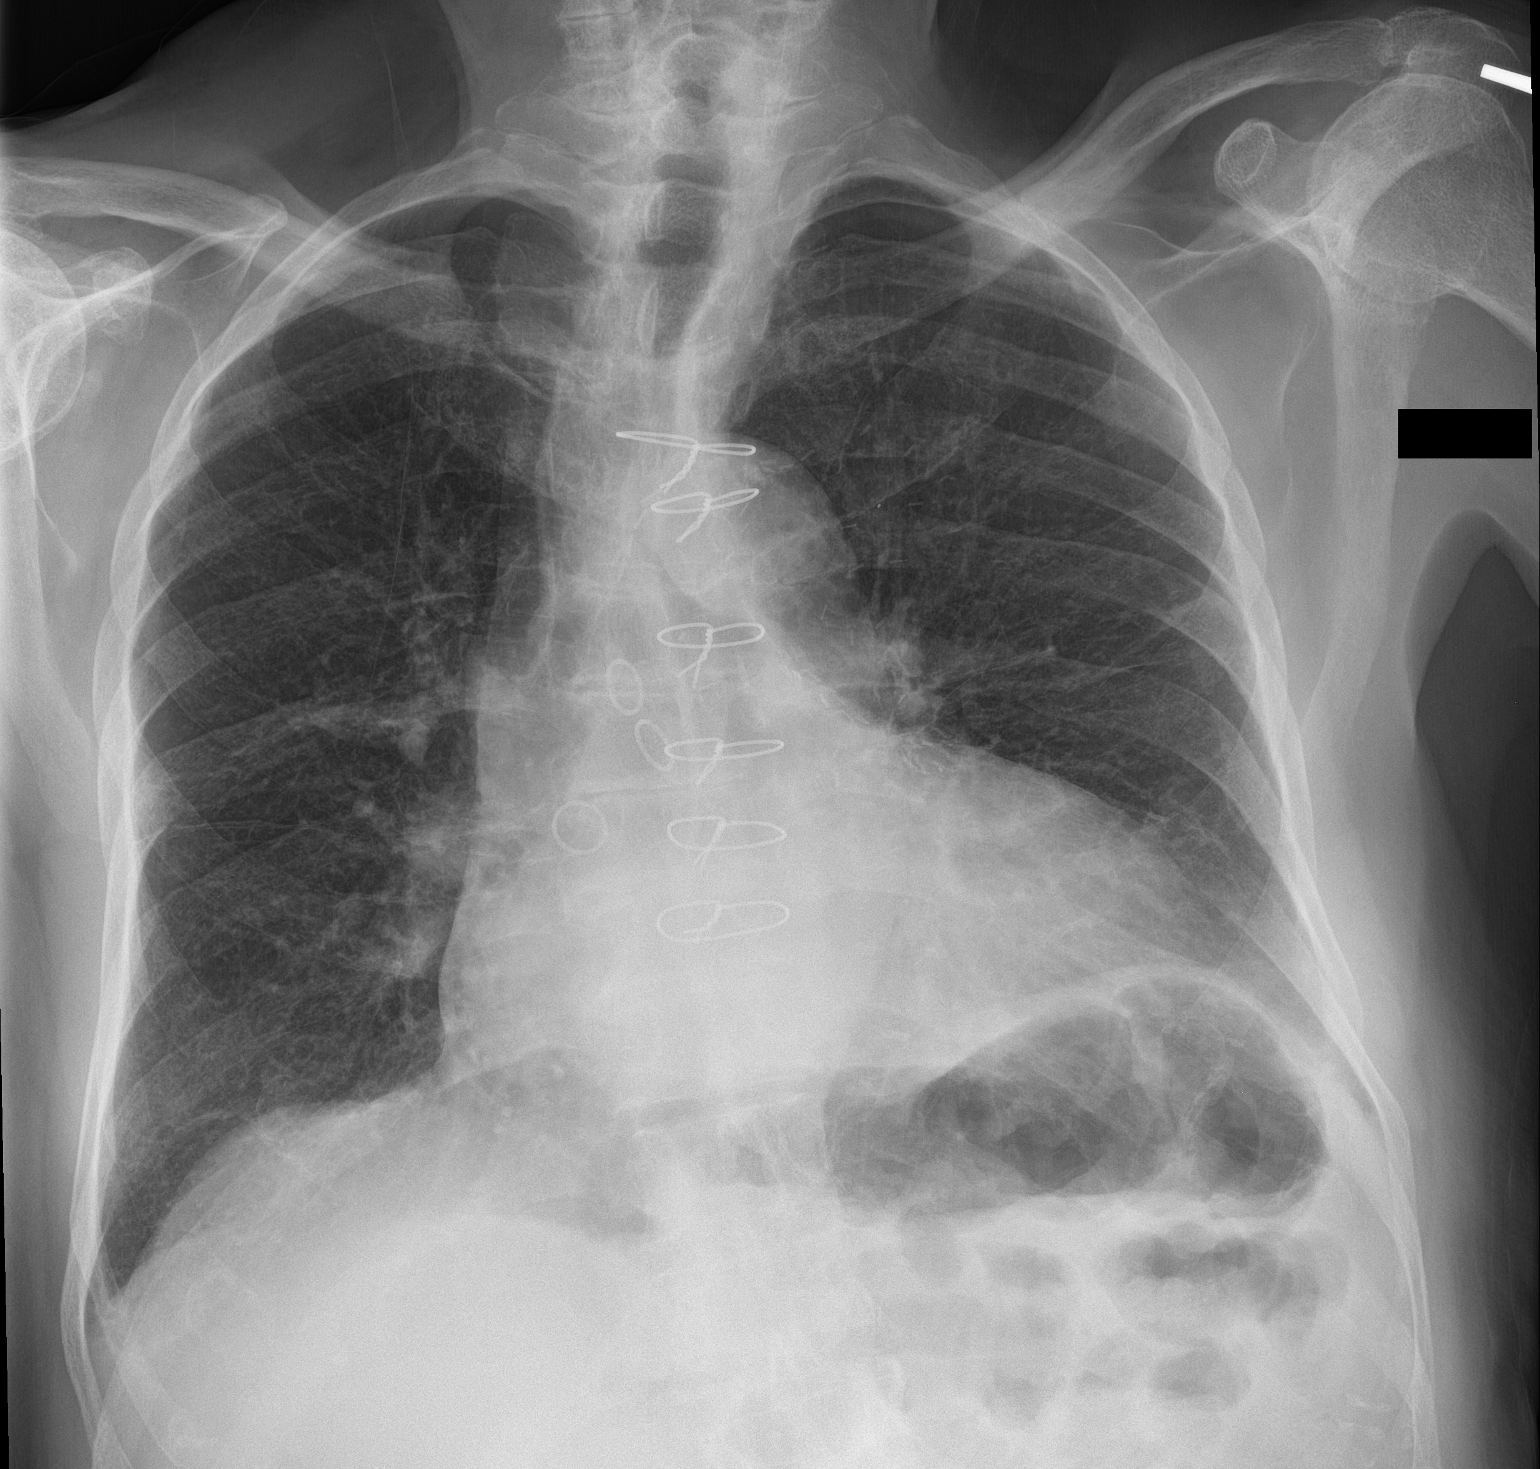

[chest lat]
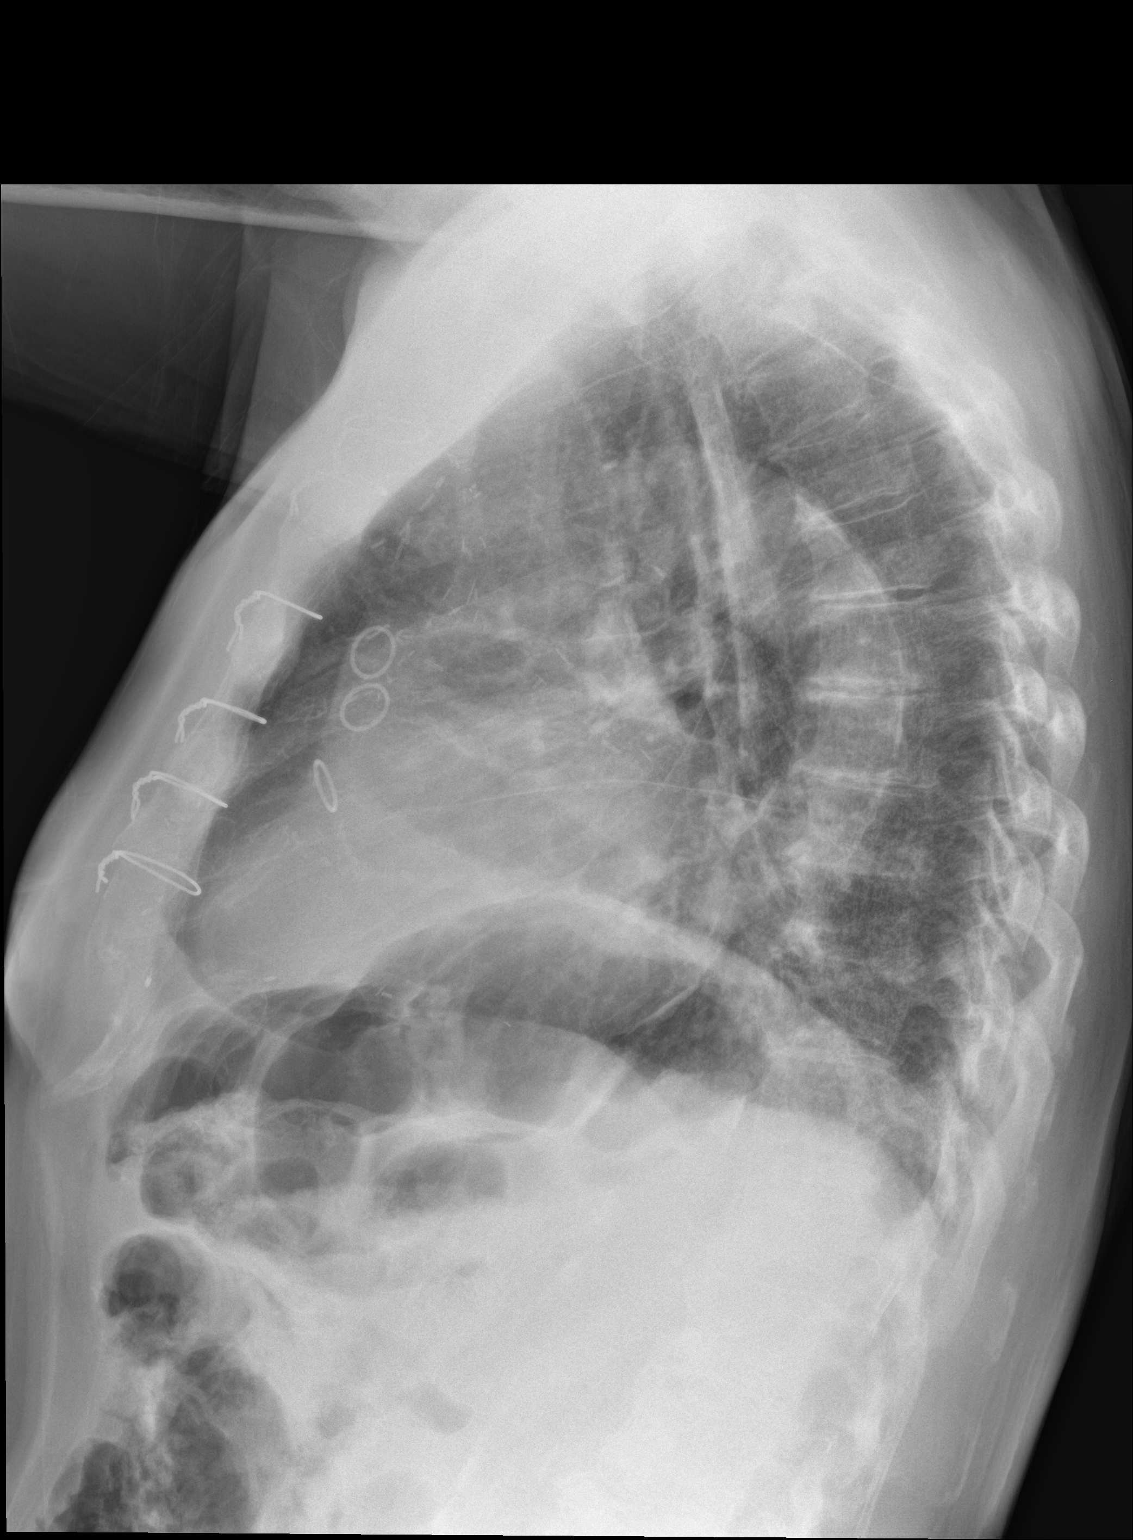

[2 of 2 positions shown; findings below may reference images not displayed]

FINDINGS: Mild left basilar opacity, likely atelectasis. No pleural effusion
or pneumothorax.

Cardiomegaly.  Postsurgical changes related to prior CABG.

Degenerative changes of the visualized thoracolumbar spine.
IMPRESSION: Mild left basilar opacity, likely atelectasis.

## 2016-08-22 ENCOUNTER — Encounter (HOSPITAL_COMMUNITY): Payer: Self-pay | Admitting: Family Medicine

## 2016-08-22 ENCOUNTER — Ambulatory Visit (HOSPITAL_COMMUNITY)
Admission: EM | Admit: 2016-08-22 | Discharge: 2016-08-22 | Disposition: A | Discharge status: Home / Self Care | Payer: BLUE CROSS/BLUE SHIELD | Attending: Family Medicine | Admitting: Family Medicine

## 2016-08-22 DIAGNOSIS — J440 Chronic obstructive pulmonary disease with acute lower respiratory infection: Secondary | ICD-10-CM

## 2016-08-22 DIAGNOSIS — J209 Acute bronchitis, unspecified: Secondary | ICD-10-CM | POA: Diagnosis not present

## 2016-08-22 MED ORDER — LEVOFLOXACIN 500 MG PO TABS: 500.0000 mg | ORAL_TABLET | Freq: Every day | ORAL | 0 refills | Status: DC

## 2016-08-22 MED ORDER — PREDNISONE 10 MG PO TABS: ORAL_TABLET | ORAL | 0 refills | Status: DC

## 2016-08-22 MED ORDER — PREDNISONE 10 MG (21) PO TBPK: 10.0000 mg | ORAL_TABLET | Freq: Every day | ORAL | 0 refills | Status: DC

## 2016-08-22 NOTE — ED Triage Notes (Signed)
Pt    C/o   Cough    And   Congested         X  sev  Weeks

## 2016-08-22 NOTE — ED Provider Notes (Signed)
[facility]    CSN: [id number] Arrival date & time: 08/22/16  1858     History   Chief Complaint Chief Complaint  Patient presents with  . Cough    HPI Dakota [name] is a 82 y.o. male.   This is an 82 year old gentleman with a history of heart disease, COPD, and diabetes who presents with cough. He's been symptomatic for 2 weeks. Has not had any hemoptysis or fever.  He says that has become yellow. He's having mild shortness of breath. He uses an inhaler and this is been helping him. He would like to go back to work tomorrow at [employer].      Past Medical History:  Diagnosis Date  . Anginal pain ([place])   . Arthritis   . Coronary artery disease   . Diabetes mellitus   . GERD (gastroesophageal reflux disease)   . History of cardiovascular stress test    Lexiscan Myoview 7/16:  EF 51%, inferior, inferolateral ischemia; Intermediate Risk  . Hypertension   . Ischemic heart disease    a. s/p CABG;  b.  LHC 05/23/15:  S-RPAVB ok, S-D2 ok, S-OM2 99% (s/p Synergy DES), L-LAD ok, EF normal (complicated by pseudoaneurysm)   . Shortness of breath dyspnea   . Smallpox     Patient Active Problem List   Diagnosis Date Noted  . COPD, moderate ([place]) 06/13/2015  . CAD (coronary artery disease) of bypass graft 05/23/2015  . Coronary artery disease involving native coronary artery of native heart with angina pectoris ([place])   . Abnormal nuclear stress test   . Ischemic chest pain ([place])   . Cough 09/26/2012  . Malaise and fatigue 04/11/2012  . Hx of CABG 04/03/2011  . Left bundle branch block 04/03/2011  . Diabetes mellitus 04/03/2011  . Hypercholesterolemia 04/03/2011  . Chronic combined systolic and diastolic congestive heart failure ([place]) 04/03/2011    Past Surgical History:  Procedure Laterality Date  . APPENDECTOMY    . [place]   normal  ([place])  . CARDIAC CATHETERIZATION N/A 05/23/2015   Procedure: Left Heart Cath and Cors/Grafts  Angiography;  Surgeon: Troy Sine, MD;  Location: [facility];  Service: Cardiovascular;  Laterality: N/A;  . CARDIAC CATHETERIZATION N/A 05/23/2015   Procedure: Coronary Stent Intervention;  Surgeon: Troy Sine, MD;  Location: [place] [facility];  Service: Cardiovascular;  Laterality: N/A;  . CORONARY ARTERY BYPASS GRAFT  12/2003  . DOPPLER ECHOCARDIOGRAPHY  07/15/10   EF 40-45%, aortic stenosis  . FOOT SURGERY Right   . HERNIA REPAIR Bilateral 1979  . PERCUTANEOUS CORONARY STENT INTERVENTION (PCI-S)  05/23/2015   des    ptca svg       Home Medications    Prior to Admission medications   Medication Sig Start Date End Date Taking? Authorizing Provider  Acetaminophen (TYLENOL PO) Take 500 mg by mouth daily as needed (for pain).     Historical Provider, MD  albuterol (PROVENTIL HFA;VENTOLIN HFA) 108 (90 Base) MCG/ACT inhaler Inhale 2 puffs into the lungs every 4 (four) hours as needed for wheezing or shortness of breath. 01/03/16   Melony Overly, MD  albuterol-ipratropium (COMBIVENT) 18-103 MCG/ACT inhaler Inhale 2 puffs into the lungs 4 (four) times daily as needed for wheezing. 07/17/15 07/16/16  Juanito Doom, MD  aspirin EC 81 MG tablet Take 81 mg by mouth daily.    Historical Provider, MD  Casanthranol-Docusate Sodium 30-100 MG CAPS Take 100 mg by  mouth daily.      Historical Provider, MD  clopidogrel (PLAVIX) 75 MG tablet Take 1 tablet (75 mg total) by mouth daily with breakfast. 06/10/15   Liliane Shi, PA-C  clopidogrel (PLAVIX) 75 MG tablet TAKE ONE TABLET BY MOUTH ONCE DAILY WITH  BREAKFAST 03/24/16   Troy Sine, MD  furosemide (LASIX) 40 MG tablet Take 1 tablet (40 mg total) by mouth daily. 05/21/15   Liliane Shi, PA-C  glimepiride (AMARYL) 2 MG tablet Take 1 mg by mouth daily before breakfast.     Historical Provider, MD  glucose 4 GM chewable tablet Chew 16 g by mouth as needed for low blood sugar.     Historical Provider, MD  levofloxacin (LEVAQUIN) 500 MG  tablet Take 1 tablet (500 mg total) by mouth daily. 08/22/16   Robyn Haber, MD  losartan (COZAAR) 50 MG tablet Take 0.5 tablets (25 mg total) by mouth daily. 08/15/15   Darlin Coco, MD  mirabegron ER (MYRBETRIQ) 50 MG TB24 tablet Take 50 mg by mouth daily.    Historical Provider, MD  Multiple Vitamins-Minerals (PRESERVISION AREDS 2 PO) Take 2 tablets by mouth daily.     Historical Provider, MD  NITROSTAT 0.4 MG SL tablet Place 1 tablet (0.4 mg total) under the tongue every 5 (five) minutes as needed (for chest pain). 06/08/16   Troy Sine, MD  ONE TOUCH ULTRA TEST test strip 1 each by Other route as needed (glucose monoring).  04/21/15   Historical Provider, MD  pantoprazole (PROTONIX) 40 MG tablet Take 1 tablet (40 mg total) by mouth daily. 05/21/15   Liliane Shi, PA-C  pravastatin (PRAVACHOL) 80 MG tablet Take 80 mg by mouth at bedtime.     Historical Provider, MD  predniSONE (STERAPRED UNI-PAK 21 TAB) 10 MG (21) TBPK tablet Take 1 tablet (10 mg total) by mouth daily. Take one daily with food 08/22/16   Robyn Haber, MD  PROSCAR 5 MG tablet Take 5 mg by mouth daily.  09/24/12   Historical Provider, MD    Family History Family History  Problem Relation Age of Onset  . Healthy Father   . Ulcers Mother   . Stroke Mother     Social History Social History  Substance Use Topics  . Smoking status: Never Smoker  . Smokeless tobacco: Never Used  . Alcohol use No     Allergies   Sulfa antibiotics; Pork-derived products; and Aceon [perindopril erbumine]   Review of Systems Review of Systems  Constitutional: Negative.   HENT: Positive for congestion and sore throat.   Eyes: Negative.   Respiratory: Positive for cough and shortness of breath.   Cardiovascular: Negative for chest pain.  Gastrointestinal: Negative.   Genitourinary: Negative.      Physical Exam Triage Vital Signs ED Triage Vitals  Enc Vitals Group     BP      Pulse      Resp      Temp      Temp  src      SpO2      Weight      Height      Head Circumference      Peak Flow      Pain Score      Pain Loc      Pain Edu?      Excl. in [place]?    No data found.   Updated Vital Signs BP 137/69 (BP Location: Left Arm)   Pulse  70   Temp 97.9 F (36.6 C) (Oral)   Resp 16   SpO2 96%    Physical Exam  Constitutional: He appears well-developed and well-nourished.  HENT:  Head: Normocephalic.  Right Ear: External ear normal.  Left Ear: External ear normal.  Patient has a 4-5 mm ulcer his posterior pharynx, midline. Is surrounded by 2-3 mm of erythema.  Eyes: Conjunctivae and EOM are normal. Pupils are equal, round, and reactive to light.  Neck: Normal range of motion. Neck supple.  Cardiovascular: Normal rate, regular rhythm and normal heart sounds.   Pulmonary/Chest: Effort normal and breath sounds normal.  Nursing note and vitals reviewed.    UC Treatments / Results  Labs (all labs ordered are listed, but only abnormal results are displayed) Labs Reviewed - No data to display  EKG  EKG Interpretation None       Radiology No results found.  Procedures Procedures (including critical care time)  Medications Ordered in UC Medications - No data to display   Initial Impression / Assessment and Plan / UC Course  I have reviewed the triage vital signs and the nursing notes.  Pertinent labs & imaging results that were available during my care of the patient were reviewed by me and considered in my medical decision making (see chart for details).  Clinical Course     Final Clinical Impressions(s) / UC Diagnoses   Final diagnoses:  COPD (chronic obstructive pulmonary disease) with acute bronchitis (HCC)    New Prescriptions New Prescriptions   LEVOFLOXACIN (LEVAQUIN) 500 MG TABLET    Take 1 tablet (500 mg total) by mouth daily.   PREDNISONE (STERAPRED UNI-PAK 21 TAB) 10 MG (21) TBPK TABLET    Take 1 tablet (10 mg total) by mouth daily. Take one daily with  food     Robyn Haber, MD 08/22/16 [phone]

## 2016-08-25 ENCOUNTER — Other Ambulatory Visit: Payer: Self-pay

## 2016-08-25 MED ORDER — LOSARTAN POTASSIUM 50 MG PO TABS: 25.0000 mg | ORAL_TABLET | Freq: Every day | ORAL | 1 refills | Status: DC

## 2016-09-01 ENCOUNTER — Encounter: Payer: Self-pay | Admitting: Cardiovascular Disease

## 2016-09-01 ENCOUNTER — Ambulatory Visit: Payer: BLUE CROSS/BLUE SHIELD | Admitting: Cardiovascular Disease

## 2016-09-07 ENCOUNTER — Ambulatory Visit (INDEPENDENT_AMBULATORY_CARE_PROVIDER_SITE_OTHER): Payer: BLUE CROSS/BLUE SHIELD | Admitting: Cardiovascular Disease

## 2016-09-07 ENCOUNTER — Encounter: Payer: Self-pay | Admitting: Cardiovascular Disease

## 2016-09-07 VITALS — BP 142/60 | HR 58 | Ht 66.0 in | Wt 132.0 lb

## 2016-09-07 DIAGNOSIS — I25119 Atherosclerotic heart disease of native coronary artery with unspecified angina pectoris: Secondary | ICD-10-CM

## 2016-09-07 DIAGNOSIS — Z794 Long term (current) use of insulin: Secondary | ICD-10-CM

## 2016-09-07 DIAGNOSIS — E7800 Pure hypercholesterolemia, unspecified: Secondary | ICD-10-CM | POA: Diagnosis not present

## 2016-09-07 DIAGNOSIS — I10 Essential (primary) hypertension: Secondary | ICD-10-CM | POA: Insufficient documentation

## 2016-09-07 DIAGNOSIS — E119 Type 2 diabetes mellitus without complications: Secondary | ICD-10-CM

## 2016-09-07 DIAGNOSIS — Z79899 Other long term (current) drug therapy: Secondary | ICD-10-CM

## 2016-09-07 DIAGNOSIS — Z951 Presence of aortocoronary bypass graft: Secondary | ICD-10-CM

## 2016-09-07 DIAGNOSIS — I5042 Chronic combined systolic (congestive) and diastolic (congestive) heart failure: Secondary | ICD-10-CM

## 2016-09-07 MED ORDER — FUROSEMIDE 40 MG PO TABS: 20.0000 mg | ORAL_TABLET | ORAL | 3 refills | Status: DC | PRN

## 2016-09-07 MED ORDER — LOSARTAN POTASSIUM 50 MG PO TABS: 25.0000 mg | ORAL_TABLET | Freq: Two times a day (BID) | ORAL | 6 refills | Status: DC

## 2016-09-07 NOTE — Progress Notes (Signed)
Patient ID: MALONE VANBLARCOM, male   DOB: 03-27-1926, 82 y.o.   MRN: [id number]    Primary M.D.: Dr. Delfino Lovett Tisovic  HPI: MERVIN RAMIRES is a 82 y.o. male is a former patient of Dr. Mare Ferrari.  I saw him 6 months ago and he presents for follow-up evaluation.  [name] has CAD and underwent CABG revascularization surgery in 2005 with a LIMA to the LAD, SVG to the diagonal, SVG to the obtuse marginal, and SVG to his distal RCA.  He has been demonstrated to have combined systolic and diastolic heart failure, left bundle branch block and diabetes mellitus.  In 2013.  An echo Doppler study showed an EF of 50-55% with moderate diastolic dysfunction.  In July 2016 he developed symptoms worrisome for unstable angina.  A nuclear perfusion study was abnormal and demonstrated inferior to inferolateral ischemia.  He underwent cardiac catheterization by me on 05/21/2015.  This revealed low normal global LV function with an EF of 50-55%.  There was significant native CAD with 95% distal left main stenosis, proximal occlusion of the LAD after the first 2 septal perforating arteries, total occlusion of the circumflex at the ostium, and RCA stenoses, 50% proximally, 50% mid, and total occlusion of the distal RCA after a small PDA-like vessel.  He had a patent LIMA graft supplying the mid LAD.  The vein graft supplying the circumflex marginal had a 99% distal anastomosis stenosis.  He had a patent vein graft supplying the diagonal vessel and a pain vein graft supplying the distal RCA with some collateralization of the AV groove circumflex from the PLA vessel. I performed successful intervention to the vein graft supplying the obtuse marginal vessel and a 3.016 mm Synergy DES stent postdilated to 3.43 mm was inserted with resumption of brisk TIMI-3 flow and 0 residual stenosis.  This procedure was complicated by a small pseudoaneurysm which essentially self thrombosed and he did not require any thrombin injection.  He last saw Dr.  Mare Ferrari in January 2017.   He established cardiology care with me in the office setting on 03/26/2016.  He denies any episodes of chest pain.  The patient has remained fairly active.  He still works at [employer] 4 days per week for 8 hours shifts.  He tells me several weeks ago.  He was treated for bronchitis.  He has noticed some mild shortness of breath with activity.  He is unaware of palpitations.  He denies significant leg swelling.  He has not had recent blood work.  He presents for evaluation.  Past Medical History:  Diagnosis Date  . Anginal pain ([place])   . Arthritis   . Coronary artery disease   . Diabetes mellitus   . GERD (gastroesophageal reflux disease)   . History of cardiovascular stress test    Lexiscan Myoview 7/16:  EF 51%, inferior, inferolateral ischemia; Intermediate Risk  . Hypertension   . Ischemic heart disease    a. s/p CABG;  b.  LHC 05/23/15:  S-RPAVB ok, S-D2 ok, S-OM2 99% (s/p Synergy DES), L-LAD ok, EF normal (complicated by pseudoaneurysm)   . Shortness of breath dyspnea   . Smallpox     Past Surgical History:  Procedure Laterality Date  . APPENDECTOMY    . [place]   normal  ([place])  . CARDIAC CATHETERIZATION N/A 05/23/2015   Procedure: Left Heart Cath and Cors/Grafts Angiography;  Surgeon: Troy Sine, MD;  Location: [facility];  Service: Cardiovascular;  Laterality:  N/A;  . CARDIAC CATHETERIZATION N/A 05/23/2015   Procedure: Coronary Stent Intervention;  Surgeon: Troy Sine, MD;  Location: [facility];  Service: Cardiovascular;  Laterality: N/A;  . CORONARY ARTERY BYPASS GRAFT  12/2003  . DOPPLER ECHOCARDIOGRAPHY  07/15/10   EF 40-45%, aortic stenosis  . FOOT SURGERY Right   . HERNIA REPAIR Bilateral 1979  . PERCUTANEOUS CORONARY STENT INTERVENTION (PCI-S)  05/23/2015   des    ptca svg    Allergies  Allergen Reactions  . Sulfa Antibiotics Swelling  . Pork-Derived Products     muslim  . Aceon  [Perindopril Erbumine] Cough    Current Outpatient Prescriptions  Medication Sig Dispense Refill  . Acetaminophen (TYLENOL PO) Take 500 mg by mouth daily as needed (for pain).     [name] albuterol (PROVENTIL HFA;VENTOLIN HFA) 108 (90 Base) MCG/ACT inhaler Inhale 2 puffs into the lungs every 4 (four) hours as needed for wheezing or shortness of breath. 1 Inhaler 0  . aspirin EC 81 MG tablet Take 81 mg by mouth daily.    Sarajane Marek Sodium 30-100 MG CAPS Take 100 mg by mouth daily.      . clopidogrel (PLAVIX) 75 MG tablet Take 1 tablet (75 mg total) by mouth daily with breakfast. 90 tablet 3  . furosemide (LASIX) 40 MG tablet Take 0.5 tablets (20 mg total) by mouth as needed. 90 tablet 3  . glimepiride (AMARYL) 2 MG tablet Take 1 mg by mouth daily before breakfast.     . glucose 4 GM chewable tablet Chew 16 g by mouth as needed for low blood sugar.     . levofloxacin (LEVAQUIN) 500 MG tablet Take 1 tablet (500 mg total) by mouth daily. 7 tablet 0  . losartan (COZAAR) 50 MG tablet Take 0.5 tablets (25 mg total) by mouth 2 (two) times daily. 30 tablet 6  . mirabegron ER (MYRBETRIQ) 50 MG TB24 tablet Take 50 mg by mouth daily.    . Multiple Vitamins-Minerals (PRESERVISION AREDS 2 PO) Take 2 tablets by mouth daily.     [name] NITROSTAT 0.4 MG SL tablet Place 1 tablet (0.4 mg total) under the tongue every 5 (five) minutes as needed (for chest pain). 25 tablet 1  . ONE TOUCH ULTRA TEST test strip 1 each by Other route as needed (glucose monoring).     . pantoprazole (PROTONIX) 40 MG tablet Take 1 tablet (40 mg total) by mouth daily. 90 tablet 3  . pravastatin (PRAVACHOL) 80 MG tablet Take 80 mg by mouth at bedtime.      Current Facility-Administered Medications  Medication Dose Route Frequency Provider Last Rate Last Dose  . midazolam (VERSED) injection 2 mg  2 mg Intravenous Once Lorretta Harp, MD      . Normal Saline Flush 0.9 % SOLN   Intravenous Once Lorretta Harp, MD      .  [place]   Does not apply Once Lorretta Harp, MD        Social History   Social History  . Marital status: Married    Spouse name: N/A  . Number of children: 2 d  . Years of education: N/A   Occupational History  . accountant     [employer]   Social History Main Topics  . Smoking status: Never Smoker  . Smokeless tobacco: Never Used  . Alcohol use No  . Drug use: No  . Sexual activity: Not on file   Other Topics Concern  .  Not on file   Social History Narrative  . No narrative on file    Additional social history is notable in that he had worked at the [employer] for 30 years in the [place].  Family History  Problem Relation Age of Onset  . Healthy Father   . Ulcers Mother   . Stroke Mother    Both parents are deceased.  ROS General: Negative; No fevers, chills, or night sweats HEENT: Negative; No changes in vision or hearing, sinus congestion, difficulty swallowing Pulmonary: Negative; No cough, wheezing, shortness of breath, hemoptysis Cardiovascular: See HPI: No chest pain, presyncope, syncope, palpatations GI: Negative; No nausea, vomiting, diarrhea, or abdominal pain GU: Negative; No dysuria, hematuria, or difficulty voiding Musculoskeletal: Negative; no myalgias, joint pain, or weakness Hematologic: Negative; no easy bruising, bleeding Endocrine: Negative; no heat/cold intolerance; no diabetes, Neuro: Negative; no changes in balance, headaches Skin: Negative; No rashes or skin lesions Psychiatric: Negative; No behavioral problems, depression Sleep: Negative; No snoring,  daytime sleepiness, hypersomnolence, bruxism, restless legs, hypnogognic hallucinations. Other comprehensive 14 point system review is negative   Physical Exam BP (!) 142/60   Pulse (!) 58   Ht '5\' 6"'$  (1.676 m)   Wt 132 lb (59.9 kg)   BMI 21.31 kg/m    Repeat blood pressure by me 122/70.  Wt Readings from Last 3 Encounters:    09/07/16 132 lb (59.9 kg)  03/24/16 129 lb (58.5 kg)  11/25/15 130 lb 12.8 oz (59.3 kg)   General: Alert, oriented, no distress.  Skin: normal turgor, no rashes, warm and dry HEENT: Normocephalic, atraumatic. Pupils equal round and reactive to light; sclera anicteric; extraocular muscles intact, No lid lag; Nose without nasal septal hypertrophy; Mouth/Parynx benign; Mallinpatti scale 2 Neck: No JVD, no carotid bruits; normal carotid upstroke Lungs: clear to ausculatation and percussion bilaterally; no wheezing or rales, normal inspiratory and expiratory effort Chest wall: without tenderness to palpitation Heart: PMI not displaced, RRR, s1 s2 normal, 1/6 systolic murmur, No diastolic murmur, no rubs, gallops, thrills, or heaves Abdomen: soft, nontender; no hepatosplenomehaly, BS+; abdominal aorta nontender and not dilated by palpation. Back: no CVA tenderness Pulses: 2+  Musculoskeletal: full range of motion, normal strength, no joint deformities Extremities: Pulses 2+, no clubbing cyanosis or edema, Homan's sign negative  Neurologic: grossly nonfocal; Cranial nerves grossly wnl Psychologic: Normal mood and affect  ECG (independently read by me): Probable sinus rhythm with interventricular conduction delay.  Heart rate 58 bpm.  June 2017 ECG (independently read by me): Normal sinus rhythm at 72 bpm with first-degree AV block.  PR interval 234 ms.  LABS:  BMP Latest Ref Rng & Units 06/10/2015 05/24/2015 05/21/2015  Glucose 70 - 99 mg/dL 123(H) 104(H) 153(H)  BUN 6 - 23 mg/dL 24(H) 13 17  Creatinine 0.40 - 1.50 mg/dL 0.85 0.66 0.75  Sodium 135 - 145 mEq/L 135 137 137  Potassium 3.5 - 5.1 mEq/L 4.6 4.4 4.4  Chloride 96 - 112 mEq/L 100 104 103  CO2 19 - 32 mEq/L '29 26 30  '$ Calcium 8.4 - 10.5 mg/dL 9.1 8.7(L) 8.8     Hepatic Function Latest Ref Rng & Units 02/13/2014  Total Protein 6.0 - 8.3 g/dL 7.0  Albumin 3.5 - 5.2 g/dL 3.8  AST 0 - 37 U/L 19  ALT 0 - 53 U/L 14  Alk Phosphatase  39 - 117 U/L 54  Total Bilirubin 0.3 - 1.2 mg/dL 0.8  Bilirubin, Direct 0.0 - 0.3 mg/dL 0.1  CBC Latest Ref Rng & Units 06/10/2015 05/24/2015 05/21/2015  WBC 4.0 - 10.5 K/uL 8.6 7.7 7.0  Hemoglobin 13.0 - 17.0 g/dL 10.4(L) 10.0(L) 10.6(L)  Hematocrit 39.0 - 52.0 % 33.9(L) 30.5(L) 33.4(L)  Platelets 150.0 - 400.0 K/uL 212.0 154 176.0   Lab Results  Component Value Date   MCV 61.0 (L) 06/10/2015   MCV 57.5 (L) 05/24/2015   MCV 60.2 Repeated and verified X2. (L) 05/21/2015    No results found for: TSH  BNP No results found for: BNP  ProBNP    Component Value Date/Time   PROBNP 137.0 (H) 06/10/2015 1504     Lipid Panel  No results found for: CHOL, TRIG, HDL, CHOLHDL, VLDL, LDLCALC, LDLDIRECT   RADIOLOGY: No results found.    ASSESSMENT AND PLAN: [name] is an 82 year-old gentleman who underwent CABG surgery in 2005.  He developed symptom complex suggestive of unstable angina and in July 2016 was found to have a 99% stenosis in the vein graft supplying the circumflex marginal vessel just at the distal anastomosis.  This was successfully stented.  He has not had any recurrent anginal therapy since and continues to feel well.  He continues to be on dual antiplatelet therapy with aspirin and Plavix.  His blood pressure today is stable on a Sartin 25 mg daily and his Lasix regimen.  He has recently been taking the Lasix 40 mg on an as-needed basis.  I am suggesting that this be reduced to 20 mg and also take when necessary.  His ECG shows sinus rhythm.  He is minimally bradycardic but asymptomatic.  He has noticed some mild increased shortness of breath, but this may be contributed by his recent bronchitis.  I am scheduling him for follow-up echo Doppler study to reassess both systolic and diastolic function.  Acid with a reduction in his Lasix dose that he slightly increase losartan to 25 mg twice a day.  A complete set of blood.  Fasting blood work will be obtained.  I will contact  him regarding results.  I will see him in 6 months for reevaluation.  Time spent: 25 minutes Troy Sine, MD, [facility]  09/07/2016 12:50 PM

## 2016-09-07 NOTE — Patient Instructions (Signed)
Your physician has recommended you make the following change in your medication:   1.) the losartan has been changed to 1/2 tablet twice a day.  2.) the furosemide has been changed to 1/2 tablet AS NEEDED.  Your physician has requested that you have an echocardiogram. Echocardiography is a painless test that uses sound waves to create images of your heart. It provides your doctor with information about the size and shape of your heart and how well your heart's chambers and valves are working. This procedure takes approximately one hour. There are no restrictions for this procedure.  Your physician recommends that you return for lab work FASTING.  Your physician wants you to follow-up in: 6 months or sooner if needed. You will receive a reminder letter in the mail two months in advance. If you don't receive a letter, please call our office to schedule the follow-up appointment.  If you need a refill on your cardiac medications before your next appointment, please call your pharmacy.

## 2016-09-08 LAB — LIPID PANEL
CHOL/HDL RATIO: 2 ratio (ref <5.0)
Cholesterol: 112 mg/dL (ref <200)
HDL: 57 mg/dL (ref >40)
LDL CALC: 45 mg/dL (ref <100)
Triglycerides: 49 mg/dL (ref <150)
VLDL: 10 mg/dL (ref <30)

## 2016-09-08 LAB — COMPREHENSIVE METABOLIC PANEL
ALK PHOS: 58 U/L (ref 40–115)
ALT: 11 U/L (ref 9–46)
AST: 16 U/L (ref 10–35)
Albumin: 3.9 g/dL (ref 3.6–5.1)
BUN: 14 mg/dL (ref 7–25)
CHLORIDE: 100 mmol/L (ref 98–110)
CO2: 27 mmol/L (ref 20–31)
Calcium: 9 mg/dL (ref 8.6–10.3)
Creat: 0.67 mg/dL — ABNORMAL LOW (ref 0.70–1.11)
GLUCOSE: 105 mg/dL — AB (ref 65–99)
POTASSIUM: 4.1 mmol/L (ref 3.5–5.3)
Sodium: 135 mmol/L (ref 135–146)
Total Bilirubin: 0.7 mg/dL (ref 0.2–1.2)
Total Protein: 6.4 g/dL (ref 6.1–8.1)

## 2016-09-08 LAB — CBC
HCT: 31 % — ABNORMAL LOW (ref 38.5–50.0)
Hemoglobin: 10 g/dL — ABNORMAL LOW (ref 13.2–17.1)
MCH: 18.8 pg — ABNORMAL LOW (ref 27.0–33.0)
MCHC: 32.3 g/dL (ref 32.0–36.0)
MCV: 58.4 fL — AB (ref 80.0–100.0)
MPV: 9.1 fL (ref 7.5–12.5)
PLATELETS: 234 10*3/uL (ref 140–400)
RBC: 5.31 MIL/uL (ref 4.20–5.80)
RDW: 16.5 % — AB (ref 11.0–15.0)
WBC: 7.9 10*3/uL (ref 3.8–10.8)

## 2016-09-09 LAB — HEMOGLOBIN A1C
HEMOGLOBIN A1C: 6 % — AB (ref <5.7)
MEAN PLASMA GLUCOSE: 126 mg/dL

## 2016-09-28 ENCOUNTER — Telehealth (HOSPITAL_COMMUNITY): Payer: Self-pay | Admitting: Cardiovascular Disease

## 2016-10-03 IMAGING — CR DG CHEST 2V
2 series · 2 of 2 positions shown · non-contrast
Comparison: PA and lateral chest x-ray March 16, 2015

CLINICAL DATA: Cough and shortness of breath with exertion for the
past couple weeks, CABG in 3111, repeat cardiac catheterization in
April 2015.

EXAM:
CHEST  2 VIEW

[w chest pa]
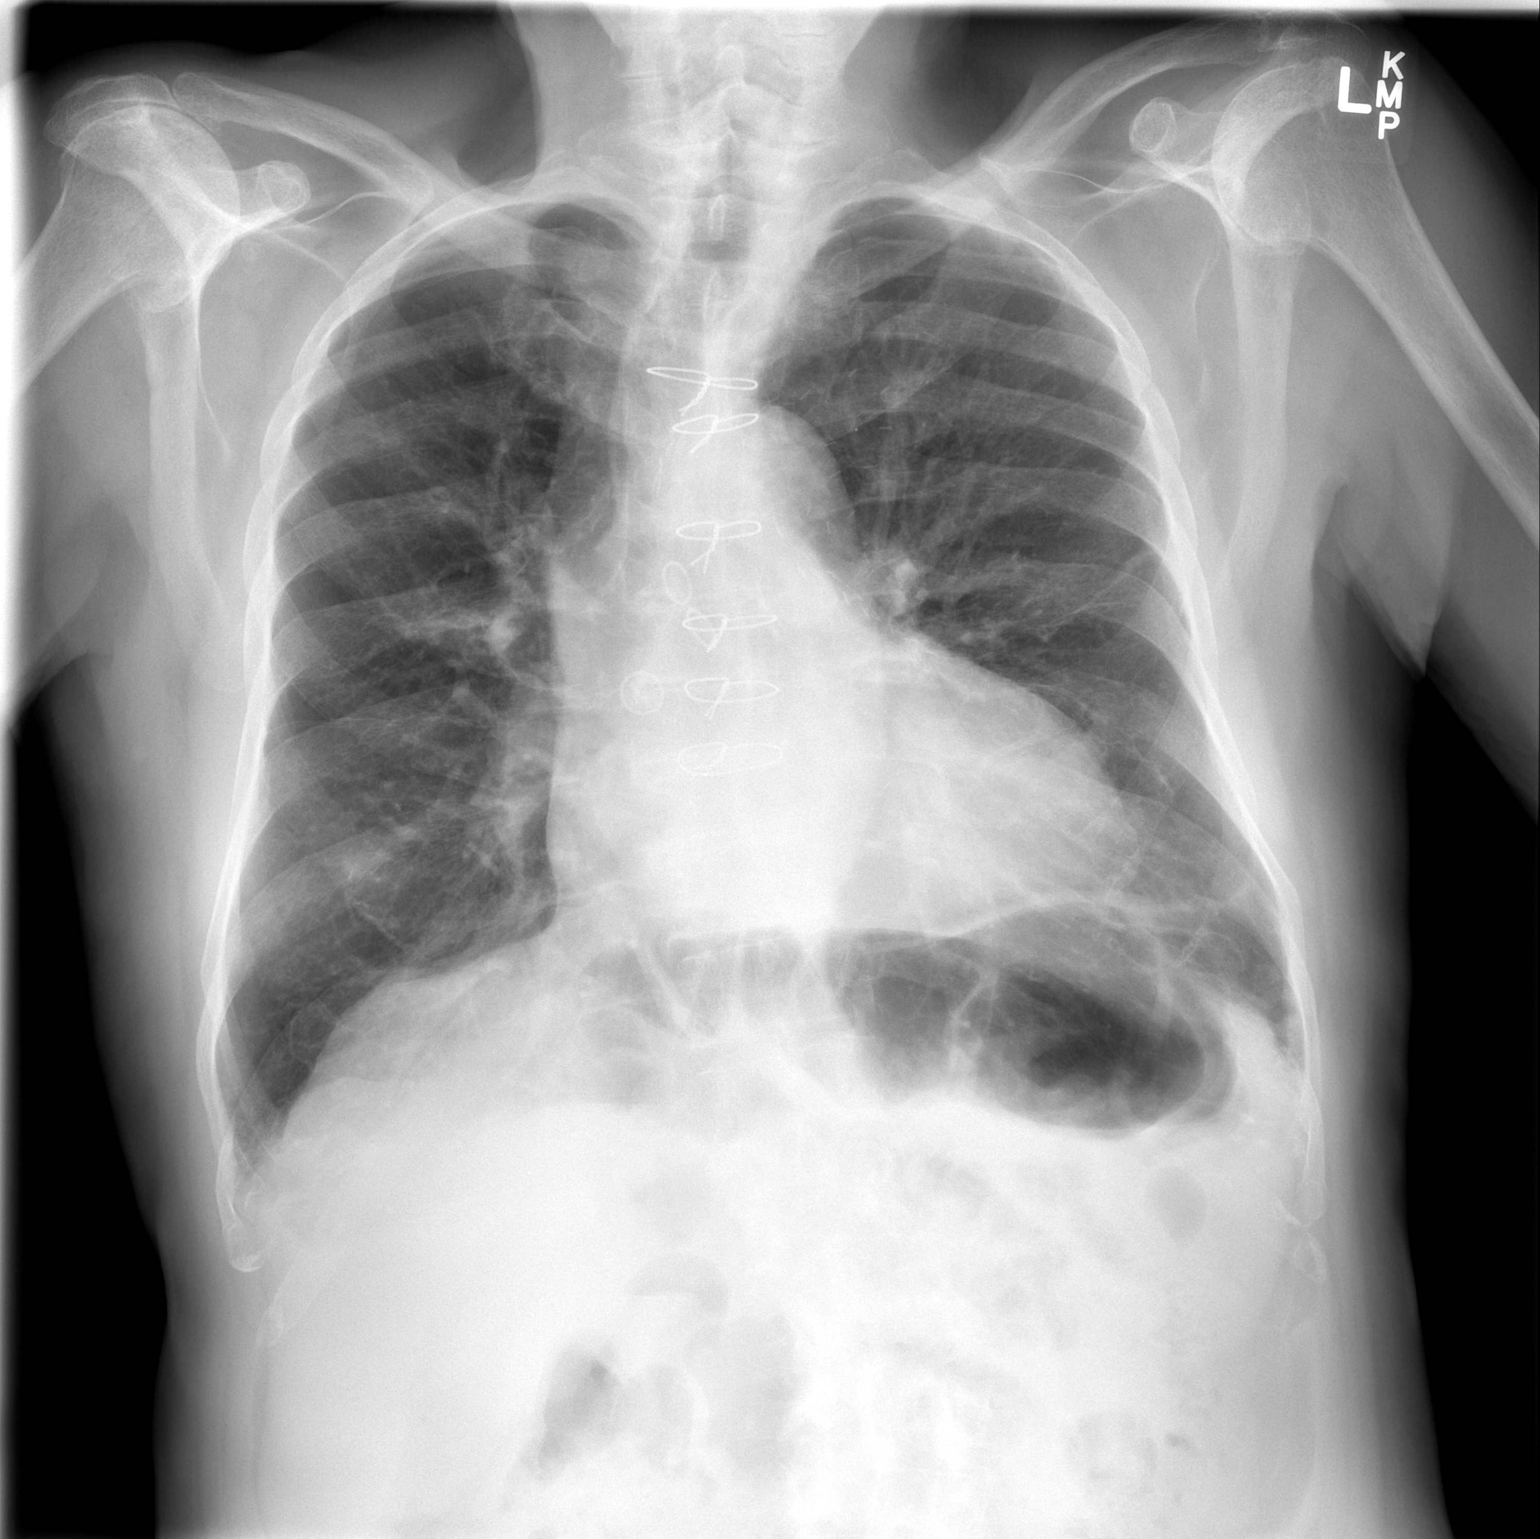

[w chest lat]
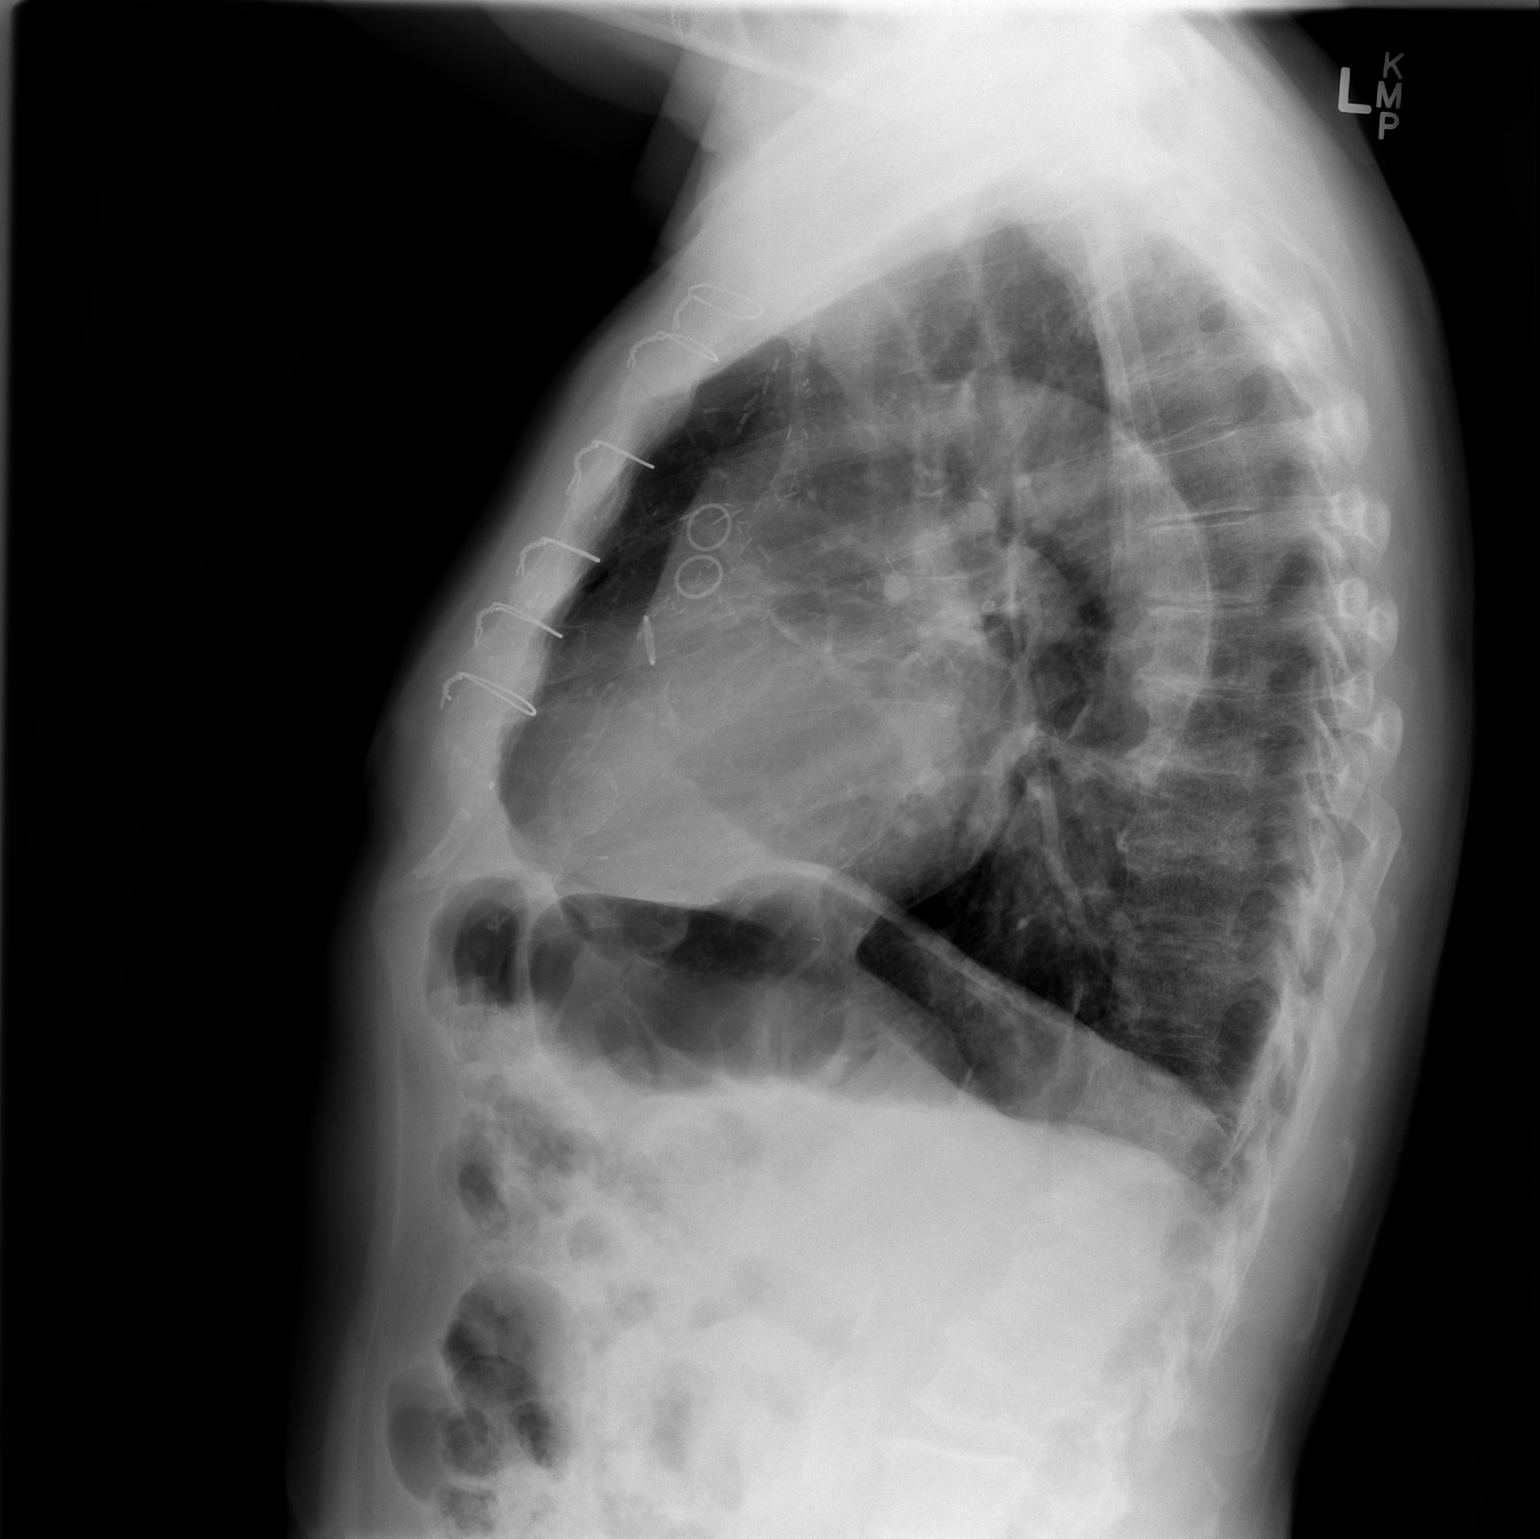

[2 of 2 positions shown; findings below may reference images not displayed]

FINDINGS: The lungs are adequately inflated. There are fibrotic changes at the
left lung base. There is no pleural effusion or alveolar infiltrate.
The heart is mildly enlarged but stable. The pulmonary vascularity
is mildly prominent centrally but also stable. There is tortuosity
of the descending thoracic aorta. There are post CABG changes. There
is multilevel degenerative disc disease of the thoracic spine. The
gas pattern in the upper abdomen is nonspecific but similar to that
seen previously.
IMPRESSION: Mild stable enlargement of the cardiac silhouette without evidence
of CHF, pneumonia, or other acute cardiopulmonary abnormality.

## 2016-10-05 ENCOUNTER — Other Ambulatory Visit (HOSPITAL_COMMUNITY): Payer: BLUE CROSS/BLUE SHIELD

## 2016-10-09 NOTE — Telephone Encounter (Signed)
From 09/28/16 Called her back and lmsg for her to return my call in regards to an earlier appt

## 2016-11-04 ENCOUNTER — Other Ambulatory Visit: Payer: Self-pay

## 2016-11-04 ENCOUNTER — Ambulatory Visit (HOSPITAL_COMMUNITY): Payer: BLUE CROSS/BLUE SHIELD | Attending: Cardiovascular Disease

## 2016-11-04 DIAGNOSIS — I25119 Atherosclerotic heart disease of native coronary artery with unspecified angina pectoris: Secondary | ICD-10-CM | POA: Diagnosis not present

## 2016-11-04 DIAGNOSIS — Z951 Presence of aortocoronary bypass graft: Secondary | ICD-10-CM | POA: Insufficient documentation

## 2016-11-04 DIAGNOSIS — I10 Essential (primary) hypertension: Secondary | ICD-10-CM | POA: Insufficient documentation

## 2016-11-04 DIAGNOSIS — E119 Type 2 diabetes mellitus without complications: Secondary | ICD-10-CM | POA: Diagnosis not present

## 2016-11-26 ENCOUNTER — Encounter: Payer: Self-pay | Admitting: *Deleted

## 2016-11-26 NOTE — Telephone Encounter (Signed)
This encounter was created in error - please disregard.

## 2016-11-26 NOTE — Telephone Encounter (Signed)
-----   Message from Troy Sine, MD sent at 11/10/2016  8:59 PM EST ----- Mild LVH; low nl LV fxn; AV sclerosis; LA/RA dilation

## 2017-01-25 ENCOUNTER — Encounter (HOSPITAL_COMMUNITY): Payer: Self-pay | Admitting: Emergency Medicine

## 2017-01-25 ENCOUNTER — Ambulatory Visit (HOSPITAL_COMMUNITY)
Admission: EM | Admit: 2017-01-25 | Discharge: 2017-01-25 | Disposition: A | Discharge status: Home / Self Care | Payer: BLUE CROSS/BLUE SHIELD | Attending: Family Medicine | Admitting: Family Medicine

## 2017-01-25 ENCOUNTER — Ambulatory Visit (INDEPENDENT_AMBULATORY_CARE_PROVIDER_SITE_OTHER): Payer: BLUE CROSS/BLUE SHIELD

## 2017-01-25 DIAGNOSIS — R0602 Shortness of breath: Secondary | ICD-10-CM | POA: Diagnosis not present

## 2017-01-25 DIAGNOSIS — J40 Bronchitis, not specified as acute or chronic: Secondary | ICD-10-CM

## 2017-01-25 DIAGNOSIS — R05 Cough: Secondary | ICD-10-CM

## 2017-01-25 MED ORDER — HYDROCODONE-HOMATROPINE 5-1.5 MG/5ML PO SYRP: 5.0000 mL | ORAL_SOLUTION | Freq: Four times a day (QID) | ORAL | 0 refills | Status: DC | PRN

## 2017-01-25 MED ORDER — LEVOFLOXACIN 500 MG PO TABS: 500.0000 mg | ORAL_TABLET | Freq: Every day | ORAL | 0 refills | Status: DC

## 2017-01-25 NOTE — ED Triage Notes (Signed)
Symptoms started 2 days ago.  Patient is coughing, aching and has a productive cough.  Phlegm is thick and dark yellow

## 2017-01-25 NOTE — ED Provider Notes (Signed)
[facility]    CSN: [id number] Arrival date & time: 01/25/17  1134     History   Chief Complaint Chief Complaint  Patient presents with  . URI    HPI Dakota [name] is a 83 y.o. male.   Patient is coughing, aching and has a productive cough.  Phlegm is thick and dark yellow.  Onset of symptoms was 2 days ago. He has a history of bronchitis.  Patient is from [place].  Patient has not had any vomiting but he has had fever.      Past Medical History:  Diagnosis Date  . Anginal pain ([place])   . Arthritis   . Coronary artery disease   . Diabetes mellitus   . GERD (gastroesophageal reflux disease)   . History of cardiovascular stress test    Lexiscan Myoview 7/16:  EF 51%, inferior, inferolateral ischemia; Intermediate Risk  . Hypertension   . Ischemic heart disease    a. s/p CABG;  b.  LHC 05/23/15:  S-RPAVB ok, S-D2 ok, S-OM2 99% (s/p Synergy DES), L-LAD ok, EF normal (complicated by pseudoaneurysm)   . Shortness of breath dyspnea   . Smallpox     Patient Active Problem List   Diagnosis Date Noted  . Essential hypertension 09/07/2016  . COPD, moderate ([place]) 06/13/2015  . CAD (coronary artery disease) of bypass graft 05/23/2015  . Coronary artery disease involving native coronary artery of native heart with angina pectoris ([place])   . Abnormal nuclear stress test   . Ischemic chest pain ([place])   . Cough 09/26/2012  . Malaise and fatigue 04/11/2012  . Hx of CABG 04/03/2011  . Left bundle branch block 04/03/2011  . Diabetes ([place]) 04/03/2011  . Hypercholesterolemia 04/03/2011  . Chronic combined systolic and diastolic congestive heart failure ([place]) 04/03/2011    Past Surgical History:  Procedure Laterality Date  . APPENDECTOMY    . [place]   normal  ([place])  . CARDIAC CATHETERIZATION N/A 05/23/2015   Procedure: Left Heart Cath and Cors/Grafts Angiography;  Surgeon: Troy Sine, MD;  Location: [facility];  Service:  Cardiovascular;  Laterality: N/A;  . CARDIAC CATHETERIZATION N/A 05/23/2015   Procedure: Coronary Stent Intervention;  Surgeon: Troy Sine, MD;  Location: [facility];  Service: Cardiovascular;  Laterality: N/A;  . CORONARY ARTERY BYPASS GRAFT  12/2003  . DOPPLER ECHOCARDIOGRAPHY  07/15/10   EF 40-45%, aortic stenosis  . FOOT SURGERY Right   . HERNIA REPAIR Bilateral 1979  . PERCUTANEOUS CORONARY STENT INTERVENTION (PCI-S)  05/23/2015   des    ptca svg       Home Medications    Prior to Admission medications   Medication Sig Start Date End Date Taking? Authorizing Provider  Acetaminophen (TYLENOL PO) Take 500 mg by mouth daily as needed (for pain).     Historical Provider, MD  albuterol (PROVENTIL HFA;VENTOLIN HFA) 108 (90 Base) MCG/ACT inhaler Inhale 2 puffs into the lungs every 4 (four) hours as needed for wheezing or shortness of breath. 01/03/16   Melony Overly, MD  aspirin EC 81 MG tablet Take 81 mg by mouth daily.    Historical Provider, MD  Casanthranol-Docusate Sodium 30-100 MG CAPS Take 100 mg by mouth daily.      Historical Provider, MD  clopidogrel (PLAVIX) 75 MG tablet Take 1 tablet (75 mg total) by mouth daily with breakfast. 06/10/15   Liliane Shi, PA-C  furosemide (LASIX) 40 MG tablet  Take 0.5 tablets (20 mg total) by mouth as needed. 09/07/16   Troy Sine, MD  glimepiride (AMARYL) 2 MG tablet Take 1 mg by mouth daily before breakfast.     Historical Provider, MD  glucose 4 GM chewable tablet Chew 16 g by mouth as needed for low blood sugar.     Historical Provider, MD  HYDROcodone-homatropine (HYDROMET) 5-1.5 MG/5ML syrup Take 5 mLs by mouth every 6 (six) hours as needed for cough. 01/25/17   Robyn Haber, MD  levofloxacin (LEVAQUIN) 500 MG tablet Take 1 tablet (500 mg total) by mouth daily. 01/25/17   Robyn Haber, MD  losartan (COZAAR) 50 MG tablet Take 0.5 tablets (25 mg total) by mouth 2 (two) times daily. 09/07/16   Troy Sine, MD  mirabegron ER  (MYRBETRIQ) 50 MG TB24 tablet Take 50 mg by mouth daily.    Historical Provider, MD  Multiple Vitamins-Minerals (PRESERVISION AREDS 2 PO) Take 2 tablets by mouth daily.     Historical Provider, MD  NITROSTAT 0.4 MG SL tablet Place 1 tablet (0.4 mg total) under the tongue every 5 (five) minutes as needed (for chest pain). 06/08/16   Troy Sine, MD  ONE TOUCH ULTRA TEST test strip 1 each by Other route as needed (glucose monoring).  04/21/15   Historical Provider, MD  pantoprazole (PROTONIX) 40 MG tablet Take 1 tablet (40 mg total) by mouth daily. 05/21/15   Liliane Shi, PA-C  pravastatin (PRAVACHOL) 80 MG tablet Take 80 mg by mouth at bedtime.     Historical Provider, MD    Family History Family History  Problem Relation Age of Onset  . Healthy Father   . Ulcers Mother   . Stroke Mother     Social History Social History  Substance Use Topics  . Smoking status: Never Smoker  . Smokeless tobacco: Never Used  . Alcohol use No     Allergies   Sulfa antibiotics; Pork-derived products; and Aceon [perindopril erbumine]   Review of Systems Review of Systems  Constitutional: Positive for activity change and fever.  HENT: Positive for congestion.   Respiratory: Positive for cough, shortness of breath and wheezing.   Gastrointestinal: Negative.   Musculoskeletal: Positive for myalgias.  Neurological: Negative.      Physical Exam Triage Vital Signs ED Triage Vitals [01/25/17 1220]  Enc Vitals Group     BP (!) 146/84     Pulse Rate 64     Resp 14     Temp 99.9 F (37.7 C)     Temp Source Oral     SpO2 98 %     Weight      Height      Head Circumference      Peak Flow      Pain Score 8     Pain Loc      Pain Edu?      Excl. in [place]?    No data found.   Updated Vital Signs BP (!) 146/84 (BP Location: Left Arm) Comment: notified rn  Pulse 64   Temp 99.9 F (37.7 C) (Oral)   Resp 14   SpO2 98%    Physical Exam  Constitutional: He is oriented to person, place,  and time. He appears well-developed and well-nourished.  HENT:  Head: Normocephalic.  Right Ear: External ear normal.  Left Ear: External ear normal.  Mouth/Throat: Oropharynx is clear and moist.  Eyes: Conjunctivae and EOM are normal. Pupils are equal, round, and reactive  to light.  Neck: Normal range of motion. Neck supple.  Cardiovascular: Normal rate.   Pulmonary/Chest: Effort normal. He has wheezes. He has rales.  Rales and wheezes in the right lower chest  Abdominal: Soft.  Musculoskeletal: Normal range of motion.  Neurological: He is alert and oriented to person, place, and time.  Skin: Skin is warm and dry.  Nursing note and vitals reviewed.    UC Treatments / Results  Labs (all labs ordered are listed, but only abnormal results are displayed) Labs Reviewed - No data to display  EKG  EKG Interpretation None       Radiology  Procedures Procedures (including critical care time)  Medications Ordered in UC Medications - No data to display   Initial Impression / Assessment and Plan / UC Course  I have reviewed the triage vital signs and the nursing notes.  Pertinent labs & imaging results that were available during my care of the patient were reviewed by me and considered in my medical decision making (see chart for details).     Final Clinical Impressions(s) / UC Diagnoses   Final diagnoses:  Bronchitis    New Prescriptions New Prescriptions   HYDROCODONE-HOMATROPINE (HYDROMET) 5-1.5 MG/5ML SYRUP    Take 5 mLs by mouth every 6 (six) hours as needed for cough.  Levaquin 500 milligrams daily for 7 days   Robyn Haber, MD 01/25/17 1322

## 2017-02-09 ENCOUNTER — Other Ambulatory Visit: Payer: Self-pay | Admitting: Cardiovascular Disease

## 2017-02-10 NOTE — Telephone Encounter (Signed)
Rx(s) sent to pharmacy electronically.  

## 2017-03-08 ENCOUNTER — Encounter: Payer: Self-pay | Admitting: Cardiovascular Disease

## 2017-03-08 ENCOUNTER — Ambulatory Visit (INDEPENDENT_AMBULATORY_CARE_PROVIDER_SITE_OTHER): Payer: BLUE CROSS/BLUE SHIELD | Admitting: Cardiovascular Disease

## 2017-03-08 VITALS — BP 118/58 | HR 59 | Ht 66.0 in | Wt 128.0 lb

## 2017-03-08 DIAGNOSIS — Z79899 Other long term (current) drug therapy: Secondary | ICD-10-CM

## 2017-03-08 DIAGNOSIS — E7800 Pure hypercholesterolemia, unspecified: Secondary | ICD-10-CM | POA: Diagnosis not present

## 2017-03-08 DIAGNOSIS — I10 Essential (primary) hypertension: Secondary | ICD-10-CM

## 2017-03-08 DIAGNOSIS — E118 Type 2 diabetes mellitus with unspecified complications: Secondary | ICD-10-CM

## 2017-03-08 DIAGNOSIS — I119 Hypertensive heart disease without heart failure: Secondary | ICD-10-CM

## 2017-03-08 DIAGNOSIS — I25119 Atherosclerotic heart disease of native coronary artery with unspecified angina pectoris: Secondary | ICD-10-CM

## 2017-03-08 DIAGNOSIS — K219 Gastro-esophageal reflux disease without esophagitis: Secondary | ICD-10-CM

## 2017-03-08 NOTE — Patient Instructions (Signed)
Medication Instructions: No changes  Labwork: Please have the following fasting labs done in 4 months before your follow up: Lipids, CBC, CMET , and TSH   Follow-Up: Your physician wants you to follow-up in: 4 months with Dr. [name]. You will receive a reminder letter in the mail two months in advance. If you don't receive a letter, please call our office to schedule the follow-up appointment.    If you need a refill on your cardiac medications before your next appointment, please call your pharmacy.

## 2017-03-08 NOTE — Progress Notes (Signed)
Patient ID: Dakota [name], male   DOB: 1926/05/10, 83 y.o.   MRN: [id number]    Primary M.D.: Dr. Delfino Lovett Tisovic  HPI: Dakota [name] is a 83 y.o. male is a former patient of Dr. Mare Ferrari.  He presents for a 6 month follow-up evaluation.  Dakota [name] has CAD and underwent CABG revascularization surgery in 2005 with a LIMA to the LAD, SVG to the diagonal, SVG to the obtuse marginal, and SVG to his distal RCA.  He has been demonstrated to have combined systolic and diastolic heart failure, left bundle branch block and diabetes mellitus.  In 2013.  An echo Doppler study showed an EF of 50-55% with moderate diastolic dysfunction.  In July 2016 he developed symptoms worrisome for unstable angina.  A nuclear perfusion study was abnormal and demonstrated inferior to inferolateral ischemia.  He underwent cardiac catheterization by me on 05/21/2015.  This revealed low normal global LV function with an EF of 50-55%.  There was significant native CAD with 95% distal left main stenosis, proximal occlusion of the LAD after the first 2 septal perforating arteries, total occlusion of the circumflex at the ostium, and RCA stenoses, 50% proximally, 50% mid, and total occlusion of the distal RCA after a small PDA-like vessel.  He had a patent LIMA graft supplying the mid LAD.  The vein graft supplying the circumflex marginal had a 99% distal anastomosis stenosis.  He had a patent vein graft supplying the diagonal vessel and a pain vein graft supplying the distal RCA with some collateralization of the AV groove circumflex from the PLA vessel. I performed successful intervention to the vein graft supplying the obtuse marginal vessel and a 3.016 mm Synergy DES stent postdilated to 3.43 mm was inserted with resumption of brisk TIMI-3 flow and 0 residual stenosis.  This procedure was complicated by a small pseudoaneurysm which essentially self thrombosed and he did not require any thrombin injection.  He last saw Dr. Mare Ferrari in  January 2017.   He established cardiology care with me in the office setting on 03/26/2016.  He denies any episodes of chest pain.  The patient has remained fairly active.  He still works at [employer] 4 days per week for 8 hours shifts.  He was treated for bronchitis.  He has noticed some mild shortness of breath with activity. He is unaware of palpitations.  He denies significant leg swelling.  He has not had recent blood work.   He underwent a repeat echo Doppler study on 11/04/2016.  This demonstrated normal systolic function with an EF of 50-55% with mild LVH and no regional wall motion abnormalities.  He had grade 2 diastolic dysfunction.  There was moderate aortic valve sclerosis without stenosis, moderate left atrial dilatation, mild right atrial dilatation, and mild pulmonary hypertension with a PA estimated pressure 36 mm.  6 months, he continues to do well.  He again denies recurrent anginal type symptoms.  I reviewed lab work done by Dr. Rosana Hoes on December 07, 2016.  Total cholesterol was 124, triglycerides 65, HDL 46, and LDL 65.  Glucose was 101.  Creatinine was 0.9.  He presents for evaluation   Past Medical History:  Diagnosis Date  . Anginal pain ([place])   . Arthritis   . Coronary artery disease   . Diabetes mellitus   . GERD (gastroesophageal reflux disease)   . History of cardiovascular stress test    Lexiscan Myoview 7/16:  EF 51%, inferior, inferolateral ischemia; Intermediate Risk  . Hypertension   .  Ischemic heart disease    a. s/p CABG;  b.  LHC 05/23/15:  S-RPAVB ok, S-D2 ok, S-OM2 99% (s/p Synergy DES), L-LAD ok, EF normal (complicated by pseudoaneurysm)   . Shortness of breath dyspnea   . Smallpox     Past Surgical History:  Procedure Laterality Date  . APPENDECTOMY    . [place]   normal  ([place])  . CARDIAC CATHETERIZATION N/A 05/23/2015   Procedure: Left Heart Cath and Cors/Grafts Angiography;  Surgeon: Troy Sine, MD;  Location: [facility];  Service: Cardiovascular;  Laterality: N/A;  . CARDIAC CATHETERIZATION N/A 05/23/2015   Procedure: Coronary Stent Intervention;  Surgeon: Troy Sine, MD;  Location: [facility];  Service: Cardiovascular;  Laterality: N/A;  . CORONARY ARTERY BYPASS GRAFT  12/2003  . DOPPLER ECHOCARDIOGRAPHY  07/15/10   EF 40-45%, aortic stenosis  . FOOT SURGERY Right   . HERNIA REPAIR Bilateral 1979  . PERCUTANEOUS CORONARY STENT INTERVENTION (PCI-S)  05/23/2015   des    ptca svg    Allergies  Allergen Reactions  . Sulfa Antibiotics Swelling  . Pork-Derived Products     muslim  . Aceon [Perindopril Erbumine] Cough    Current Outpatient Prescriptions  Medication Sig Dispense Refill  . Acetaminophen (TYLENOL PO) Take 500 mg by mouth daily as needed (for pain).     [name] albuterol (PROVENTIL HFA;VENTOLIN HFA) 108 (90 Base) MCG/ACT inhaler Inhale 2 puffs into the lungs every 4 (four) hours as needed for wheezing or shortness of breath. 1 Inhaler 0  . aspirin EC 81 MG tablet Take 81 mg by mouth daily.    . clopidogrel (PLAVIX) 75 MG tablet Take 1 tablet (75 mg total) by mouth daily with breakfast. 90 tablet 3  . furosemide (LASIX) 40 MG tablet Take 0.5 tablets (20 mg total) by mouth as needed. 90 tablet 3  . glimepiride (AMARYL) 2 MG tablet Take 1 mg by mouth daily before breakfast.     . glucose 4 GM chewable tablet Chew 16 g by mouth as needed for low blood sugar.     . losartan (COZAAR) 50 MG tablet TAKE 1/2 TABLET BY MOUTH DAILY 45 tablet 1  . mirabegron ER (MYRBETRIQ) 50 MG TB24 tablet Take 50 mg by mouth daily.    . Multiple Vitamins-Minerals (PRESERVISION AREDS 2 PO) Take 2 tablets by mouth daily.     [name] NITROSTAT 0.4 MG SL tablet Place 1 tablet (0.4 mg total) under the tongue every 5 (five) minutes as needed (for chest pain). 25 tablet 1  . ONE TOUCH ULTRA TEST test strip 1 each by Other route as needed (glucose monoring).     . pantoprazole (PROTONIX) 40 MG tablet Take 1  tablet (40 mg total) by mouth daily. 90 tablet 3  . pravastatin (PRAVACHOL) 80 MG tablet Take 80 mg by mouth at bedtime.      Current Facility-Administered Medications  Medication Dose Route Frequency Provider Last Rate Last Dose  . midazolam (VERSED) injection 2 mg  2 mg Intravenous Once Lorretta Harp, MD      . Normal Saline Flush 0.9 % SOLN   Intravenous Once Lorretta Harp, MD      . [place] STOPCOCK MISC   Does not apply Once Lorretta Harp, MD        Social History   Social History  . Marital status: Married    Spouse name: N/A  . Number of children: 2  d  . Years of education: N/A   Occupational History  . accountant     [employer]   Social History Main Topics  . Smoking status: Never Smoker  . Smokeless tobacco: Never Used  . Alcohol use No  . Drug use: No  . Sexual activity: Not on file   Other Topics Concern  . Not on file   Social History Narrative  . No narrative on file    Additional social history is notable in that he had worked at the [employer] for 30 years in the [place].  Family History  Problem Relation Age of Onset  . Healthy Father   . Ulcers Mother   . Stroke Mother    Both parents are deceased.  ROS General: Negative; No fevers, chills, or night sweats HEENT: Negative; No changes in vision or hearing, sinus congestion, difficulty swallowing Pulmonary: Positive for remote bronchitis Cardiovascular: See HPI: No chest pain, presyncope, syncope, palpatations GI: Negative; No nausea, vomiting, diarrhea, or abdominal pain GU: Negative; No dysuria, hematuria, or difficulty voiding Musculoskeletal: Negative; no myalgias, joint pain, or weakness Hematologic: Negative; no easy bruising, bleeding Endocrine: Negative; no heat/cold intolerance; no diabetes, Neuro: Negative; no changes in balance, headaches Skin: Negative; No rashes or skin lesions Psychiatric: Negative; No behavioral problems,  depression Sleep: Negative; No snoring,  daytime sleepiness, hypersomnolence, bruxism, restless legs, hypnogognic hallucinations. Other comprehensive 14 point system review is negative   Physical Exam BP (!) 118/58   Pulse (!) 59   Ht '5\' 6"'$  (1.676 m)   Wt 128 lb (58.1 kg)   BMI 20.66 kg/m    Repeat blood pressure by me 120/66.  Wt Readings from Last 3 Encounters:  03/08/17 128 lb (58.1 kg)  09/07/16 132 lb (59.9 kg)  03/24/16 129 lb (58.5 kg)   General: Alert, oriented, no distress.  Skin: normal turgor, no rashes, warm and dry HEENT: Normocephalic, atraumatic. Pupils equal round and reactive to light; sclera anicteric; extraocular muscles intact, No lid lag; Nose without nasal septal hypertrophy; Mouth/Parynx benign; Mallinpatti scale 2 Neck: No JVD, no carotid bruits; normal carotid upstroke Lungs: clear to ausculatation and percussion bilaterally; no wheezing or rales, normal inspiratory and expiratory effort Chest wall: without tenderness to palpitation Heart: PMI not displaced, RRR, s1 s2 normal, 1/6 systolic murmur, No diastolic murmur, no rubs, gallops, thrills, or heaves Abdomen: soft, nontender; no hepatosplenomehaly, BS+; abdominal aorta nontender and not dilated by palpation. Back: no CVA tenderness Pulses: 2+  Musculoskeletal: full range of motion, normal strength, no joint deformities Extremities: Pulses 2+, no clubbing cyanosis or edema, Homan's sign negative  Neurologic: grossly nonfocal; Cranial nerves grossly wnl Psychologic: Normal mood and affect  ECG (independently read by me): Sinus bradycardia at 59 bpm with PACs and sinus arrhythmia.  Left axis deviation.  Left bundle branch block with repolarization changes.  First-degree AV block with a PR interval at 288 ms.  QTc interval 449 ms.  November 2017 ECG (independently read by me): Probable sinus rhythm with interventricular conduction delay.  Heart rate 58 bpm.  June 2017 ECG (independently read by me):  Normal sinus rhythm at 72 bpm with first-degree AV block.  PR interval 234 ms.  LABS:  BMP Latest Ref Rng & Units 09/07/2016 06/10/2015 05/24/2015  Glucose 65 - 99 mg/dL 105(H) 123(H) 104(H)  BUN 7 - 25 mg/dL 14 24(H) 13  Creatinine 0.70 - 1.11 mg/dL 0.67(L) 0.85 0.66  Sodium 135 - 146 mmol/L 135 135 137  Potassium  3.5 - 5.3 mmol/L 4.1 4.6 4.4  Chloride 98 - 110 mmol/L 100 100 104  CO2 20 - 31 mmol/L '27 29 26  '$ Calcium 8.6 - 10.3 mg/dL 9.0 9.1 8.7(L)     Hepatic Function Latest Ref Rng & Units 09/07/2016 02/13/2014  Total Protein 6.1 - 8.1 g/dL 6.4 7.0  Albumin 3.6 - 5.1 g/dL 3.9 3.8  AST 10 - 35 U/L 16 19  ALT 9 - 46 U/L 11 14  Alk Phosphatase 40 - 115 U/L 58 54  Total Bilirubin 0.2 - 1.2 mg/dL 0.7 0.8  Bilirubin, Direct 0.0 - 0.3 mg/dL - 0.1    CBC Latest Ref Rng & Units 09/07/2016 06/10/2015 05/24/2015  WBC 3.8 - 10.8 K/uL 7.9 8.6 7.7  Hemoglobin 13.2 - 17.1 g/dL 10.0(L) 10.4(L) 10.0(L)  Hematocrit 38.5 - 50.0 % 31.0(L) 33.9(L) 30.5(L)  Platelets 140 - 400 K/uL 234 212.0 154   Lab Results  Component Value Date   MCV 58.4 (L) 09/07/2016   MCV 61.0 (L) 06/10/2015   MCV 57.5 (L) 05/24/2015    No results found for: TSH  BNP No results found for: BNP  ProBNP    Component Value Date/Time   PROBNP 137.0 (H) 06/10/2015 1504     Lipid Panel     Component Value Date/Time   CHOL 112 09/07/2016 1027   TRIG 49 09/07/2016 1027   HDL 57 09/07/2016 1027   CHOLHDL 2.0 09/07/2016 1027   VLDL 10 09/07/2016 1027   LDLCALC 45 09/07/2016 1027     RADIOLOGY: No results found.  IMPRESSION:  1. Essential hypertension   2. Coronary artery disease involving native coronary artery of native heart with angina pectoris ([place])   3. Hypercholesterolemia   4. Medication management   5. Gastroesophageal reflux disease without esophagitis   6. Type 2 diabetes mellitus with complication, without long-term current use of insulin (H[facility])   7. Hypertensive heart disease without heart  failure     ASSESSMENT AND PLAN: Dakota [name] is an 83 year-old gentleman who underwent CABG surgery in 2005.  He developed symptom complex suggestive of unstable angina and in July 2016 was found to have a 99% stenosis in the vein graft supplying the circumflex marginal vessel just at the distal anastomosis.  This was successfully stented.  He has not had any recurrent anginal therapy since and continues to feel well.  When I last saw him, he had had a recent episode of bronchitis and had noticed some slight increased shortness of breath.  I reviewed his echo Doppler study from 11/04/2016 with him in detail today.  This showed mild LVH with action of 50-55% and grade 2 diastolic dysfunction.  There was aortic valve sclerosis without stenosis, and dilatation of his LAD and RA with mild pulmonary hypertension.  His blood pressure today is stable on his medical regimen which includes furosemide 20 mg daily and losartan at just 25 mg.  He continues to be on dual antiplatelet therapy with aspirin and Plavix and denies bleeding.  His GERD has been controlled with pantoprazole.  He continues to be on pravastatin 80 mg daily for hyperlipidemia.  In November 2017.  LDL was excellent at 45.  He has type 2 diabetes mellitus and continues to be on glimepiride.  Clinically, he is remaining stable on his current medical regimen.  I have suggested that I see him back in 6 months.  However, he tells me that Dr. Mare Ferrari in follow him every 3 or 4 months and  he requests that I at least see him in 4 months rather than 6 months.  Time spent: 25 minutes Troy Sine, MD, [facility]  03/10/2017 6:12 PM

## 2017-06-07 ENCOUNTER — Other Ambulatory Visit: Payer: Self-pay | Admitting: Physician Assistant

## 2017-06-07 DIAGNOSIS — I25119 Atherosclerotic heart disease of native coronary artery with unspecified angina pectoris: Secondary | ICD-10-CM

## 2017-06-07 DIAGNOSIS — I10 Essential (primary) hypertension: Secondary | ICD-10-CM

## 2017-06-07 DIAGNOSIS — E785 Hyperlipidemia, unspecified: Secondary | ICD-10-CM

## 2017-06-07 DIAGNOSIS — I5042 Chronic combined systolic (congestive) and diastolic (congestive) heart failure: Secondary | ICD-10-CM

## 2017-06-28 ENCOUNTER — Other Ambulatory Visit: Payer: Self-pay | Admitting: Cardiovascular Disease

## 2017-06-29 NOTE — Telephone Encounter (Signed)
REFILL 

## 2017-07-21 LAB — COMPLETE METABOLIC PANEL WITH GFR
AG RATIO: 1.8 (calc) (ref 1.0–2.5)
ALBUMIN MSPROF: 4.2 g/dL (ref 3.6–5.1)
ALKALINE PHOSPHATASE (APISO): 67 U/L (ref 40–115)
ALT: 13 U/L (ref 9–46)
AST: 16 U/L (ref 10–35)
BILIRUBIN TOTAL: 0.7 mg/dL (ref 0.2–1.2)
BUN: 18 mg/dL (ref 7–25)
CHLORIDE: 103 mmol/L (ref 98–110)
CO2: 30 mmol/L (ref 20–32)
CREATININE: 0.73 mg/dL (ref 0.70–1.11)
Calcium: 8.9 mg/dL (ref 8.6–10.3)
GFR, Est African American: 95 mL/min/{1.73_m2} (ref >=60)
GFR, Est Non African American: 82 mL/min/{1.73_m2} (ref >=60)
GLOBULIN: 2.4 g/dL (ref 1.9–3.7)
Glucose, Bld: 141 mg/dL — ABNORMAL HIGH (ref 65–99)
POTASSIUM: 4.3 mmol/L (ref 3.5–5.3)
SODIUM: 139 mmol/L (ref 135–146)
Total Protein: 6.6 g/dL (ref 6.1–8.1)

## 2017-07-21 LAB — LIPID PANEL
CHOL/HDL RATIO: 2.2 (calc) (ref <5.0)
CHOLESTEROL: 114 mg/dL (ref <200)
HDL: 51 mg/dL (ref >40)
LDL CHOLESTEROL (CALC): 45 mg/dL
Non-HDL Cholesterol (Calc): 63 mg/dL (calc) (ref <130)
TRIGLYCERIDES: 93 mg/dL (ref <150)

## 2017-07-21 LAB — CBC
HCT: 36.3 % — ABNORMAL LOW (ref 38.5–50.0)
Hemoglobin: 10.9 g/dL — ABNORMAL LOW (ref 13.2–17.1)
MCH: 18.1 pg — ABNORMAL LOW (ref 27.0–33.0)
MCHC: 30 g/dL — ABNORMAL LOW (ref 32.0–36.0)
MCV: 60.3 fL — ABNORMAL LOW (ref 80.0–100.0)
MPV: 10.6 fL (ref 7.5–12.5)
Platelets: 227 Thousand/uL (ref 140–400)
RBC: 6.02 Million/uL — ABNORMAL HIGH (ref 4.20–5.80)
RDW: 17.3 % — ABNORMAL HIGH (ref 11.0–15.0)
WBC: 6.4 Thousand/uL (ref 3.8–10.8)

## 2017-07-21 LAB — TSH: TSH: 1.41 mIU/L (ref 0.40–4.50)

## 2017-08-03 ENCOUNTER — Ambulatory Visit (INDEPENDENT_AMBULATORY_CARE_PROVIDER_SITE_OTHER): Payer: BLUE CROSS/BLUE SHIELD | Admitting: Cardiovascular Disease

## 2017-08-03 ENCOUNTER — Encounter: Payer: Self-pay | Admitting: Cardiovascular Disease

## 2017-08-03 VITALS — BP 150/54 | HR 56 | Ht 67.0 in | Wt 127.4 lb

## 2017-08-03 DIAGNOSIS — I519 Heart disease, unspecified: Secondary | ICD-10-CM | POA: Diagnosis not present

## 2017-08-03 DIAGNOSIS — K219 Gastro-esophageal reflux disease without esophagitis: Secondary | ICD-10-CM

## 2017-08-03 DIAGNOSIS — I10 Essential (primary) hypertension: Secondary | ICD-10-CM | POA: Diagnosis not present

## 2017-08-03 DIAGNOSIS — I25119 Atherosclerotic heart disease of native coronary artery with unspecified angina pectoris: Secondary | ICD-10-CM

## 2017-08-03 DIAGNOSIS — E118 Type 2 diabetes mellitus with unspecified complications: Secondary | ICD-10-CM | POA: Diagnosis not present

## 2017-08-03 DIAGNOSIS — Z951 Presence of aortocoronary bypass graft: Secondary | ICD-10-CM | POA: Diagnosis not present

## 2017-08-03 DIAGNOSIS — I5189 Other ill-defined heart diseases: Secondary | ICD-10-CM

## 2017-08-03 MED ORDER — LOSARTAN POTASSIUM 50 MG PO TABS: 25.0000 mg | ORAL_TABLET | Freq: Two times a day (BID) | ORAL | 3 refills | Status: DC

## 2017-08-03 NOTE — Patient Instructions (Signed)
Medication Instructions:  INCREASE losartan to 25 mg (1/2 tablet) two times daily  Follow-Up: Your physician recommends that you schedule a follow-up appointment in: 4 MONTHS with Dr. [name].    Any Other Special Instructions Will Be Listed Below (If Applicable).     If you need a refill on your cardiac medications before your next appointment, please call your pharmacy.

## 2017-08-03 NOTE — Progress Notes (Signed)
Patient ID: Dakota [name], male   DOB: 1926-05-09, 83 y.o.   MRN: [id number]    Primary M.D.: Dr. Delfino Lovett [name]  HPI: Dakota [name] is a 83 y.o. male is a former patient of Dr. Mare [name].  He presents for a 5 month follow-up evaluation.  [name] has CAD and underwent CABG revascularization surgery in 2005 with a LIMA to the LAD, SVG to the diagonal, SVG to the obtuse marginal, and SVG to his distal RCA.  He has been demonstrated to have combined systolic and diastolic heart failure, left bundle branch block and diabetes mellitus.  In 2013.  An echo Doppler study showed an EF of 50-55% with moderate diastolic dysfunction.  In July 2016 he developed symptoms worrisome for unstable angina.  A nuclear perfusion study was abnormal and demonstrated inferior to inferolateral ischemia.  He underwent cardiac catheterization by me on 05/21/2015.  This revealed low normal global LV function with an EF of 50-55%.  There was significant native CAD with 95% distal left main stenosis, proximal occlusion of the LAD after the first 2 septal perforating arteries, total occlusion of the circumflex at the ostium, and RCA stenoses, 50% proximally, 50% mid, and total occlusion of the distal RCA after a small PDA-like vessel.  He had a patent LIMA graft supplying the mid LAD.  The vein graft supplying the circumflex marginal had a 99% distal anastomosis stenosis.  He had a patent vein graft supplying the diagonal vessel and a pain vein graft supplying the distal RCA with some collateralization of the AV groove circumflex from the PLA vessel. I performed successful intervention to the vein graft supplying the obtuse marginal vessel and a 3.016 mm Synergy DES stent postdilated to 3.43 mm was inserted with resumption of brisk TIMI-3 flow and 0 residual stenosis.  This procedure was complicated by a small pseudoaneurysm which essentially self thrombosed and he did not require any thrombin injection.  He last saw Dr. Mare [name] in  January 2017.   He established cardiology care with me in the office setting on 03/26/2016.  He denies any episodes of chest pain.  The patient has remained fairly active.  He still works at [employer] 4 days per week for 8 hours shifts.  He was treated for bronchitis.  He has noticed some mild shortness of breath with activity. He is unaware of palpitations.  He denies significant leg swelling.  He has not had recent blood work.   He underwent a repeat echo Doppler study on 11/04/2016.  This demonstrated normal systolic function with an EF of 50-55% with mild LVH and no regional wall motion abnormalities.  He had grade 2 diastolic dysfunction.  There was moderate aortic valve sclerosis without stenosis, moderate left atrial dilatation, mild right atrial dilatation, and mild pulmonary hypertension with a PA estimated pressure 36 mm.  6 months, he continues to do well.  He again denies recurrent anginal type symptoms.  I reviewed lab work done by Dr. Rosana [name] on December 07, 2016.  Total cholesterol was 124, triglycerides 65, HDL 46, and LDL 65.  Glucose was 101.  Creatinine was 0.9.  Since I last saw him in May 2018.  He has continued to feel well from a cardiac standpoint.  He was taken off Amaryl by Dr. Kandra [name].  Neck.  Subsequent blood work 2 weeks ago had shown his glucose increased at 141.  He was mildly anemic with a hemoglobin of 10.9, hematocrit 36.3.  Lipid studies were excellent with a total  cluster 114, HDL 51, triglycerides 93, LDL 45.  He denies any recurrent chest pain.  He continues to work at [employer] from 9 AM until 6 PM on Thursday, Friday, Saturday, and Sundays.  He denies shortness of breath.  He presents for evaluation.  Past Medical History:  Diagnosis Date  . Anginal pain ([place])   . Arthritis   . Coronary artery disease   . Diabetes mellitus   . GERD (gastroesophageal reflux disease)   . History of cardiovascular stress test    Lexiscan Myoview 7/16:  EF 51%, inferior, inferolateral  ischemia; Intermediate Risk  . Hypertension   . Ischemic heart disease    a. s/p CABG;  b.  LHC 05/23/15:  S-RPAVB ok, S-D2 ok, S-OM2 99% (s/p Synergy DES), L-LAD ok, EF normal (complicated by pseudoaneurysm)   . Shortness of breath dyspnea   . Smallpox     Past Surgical History:  Procedure Laterality Date  . APPENDECTOMY    . [place]   normal  ([place])  . CARDIAC CATHETERIZATION N/A 05/23/2015   Procedure: Left Heart Cath and Cors/Grafts Angiography;  Surgeon: Troy Sine, MD;  Location: [facility];  Service: Cardiovascular;  Laterality: N/A;  . CARDIAC CATHETERIZATION N/A 05/23/2015   Procedure: Coronary Stent Intervention;  Surgeon: Troy Sine, MD;  Location: [facility];  Service: Cardiovascular;  Laterality: N/A;  . CORONARY ARTERY BYPASS GRAFT  12/2003  . DOPPLER ECHOCARDIOGRAPHY  07/15/10   EF 40-45%, aortic stenosis  . FOOT SURGERY Right   . HERNIA REPAIR Bilateral 1979  . PERCUTANEOUS CORONARY STENT INTERVENTION (PCI-S)  05/23/2015   des    ptca svg    Allergies  Allergen Reactions  . Sulfa Antibiotics Swelling  . Pork-Derived Products     muslim  . Aceon [Perindopril Erbumine] Cough    Current Outpatient Prescriptions  Medication Sig Dispense Refill  . Acetaminophen (TYLENOL PO) Take 500 mg by mouth daily as needed (for pain).     [name] albuterol (PROVENTIL HFA;VENTOLIN HFA) 108 (90 Base) MCG/ACT inhaler Inhale 2 puffs into the lungs every 4 (four) hours as needed for wheezing or shortness of breath. 1 Inhaler 0  . aspirin EC 81 MG tablet Take 81 mg by mouth daily.    . clopidogrel (PLAVIX) 75 MG tablet TAKE ONE TABLET BY MOUTH ONCE DAILY WITH  BREAKFAST 90 tablet 1  . furosemide (LASIX) 40 MG tablet Take 0.5 tablets (20 mg total) by mouth as needed. 90 tablet 3  . glucose 4 GM chewable tablet Chew 16 g by mouth as needed for low blood sugar.     . losartan (COZAAR) 50 MG tablet Take 0.5 tablets (25 mg total) by mouth 2 (two)  times daily. 90 tablet 3  . mirabegron ER (MYRBETRIQ) 50 MG TB24 tablet Take 50 mg by mouth daily.    . Multiple Vitamins-Minerals (PRESERVISION AREDS 2 PO) Take 2 tablets by mouth daily.     [name] NITROSTAT 0.4 MG SL tablet Place 1 tablet (0.4 mg total) under the tongue every 5 (five) minutes as needed (for chest pain). 25 tablet 1  . ONE TOUCH ULTRA TEST test strip 1 each by Other route as needed (glucose monoring).     . pantoprazole (PROTONIX) 40 MG tablet Take 1 tablet (40 mg total) by mouth daily. 90 tablet 3  . pravastatin (PRAVACHOL) 80 MG tablet Take 80 mg by mouth at bedtime.      Current Facility-Administered Medications  Medication Dose Route Frequency Provider Last Rate Last Dose  . midazolam (VERSED) injection 2 mg  2 mg Intravenous Once Lorretta Harp, MD      . Normal Saline Flush 0.9 % SOLN   Intravenous Once Lorretta Harp, MD      . [place] STOPCOCK MISC   Does not apply Once Lorretta Harp, MD        Social History   Social History  . Marital status: Married    Spouse name: N/A  . Number of children: 2 d  . Years of education: N/A   Occupational History  . accountant     [employer]   Social History Main Topics  . Smoking status: Never Smoker  . Smokeless tobacco: Never Used  . Alcohol use No  . Drug use: No  . Sexual activity: Not on file   Other Topics Concern  . Not on file   Social History Narrative  . No narrative on file    Additional social history is notable in that he had worked at the [employer] for 30 years in the [place].  Family History  Problem Relation Age of Onset  . Healthy Father   . Ulcers Mother   . Stroke Mother    Both parents are deceased.  ROS General: Negative; No fevers, chills, or night sweats HEENT: Negative; No changes in vision or hearing, sinus congestion, difficulty swallowing Pulmonary: Positive for remote bronchitis Cardiovascular: See HPI: No chest pain, presyncope,  syncope, palpatations GI: Negative; No nausea, vomiting, diarrhea, or abdominal pain GU: Negative; No dysuria, hematuria, or difficulty voiding Musculoskeletal: Negative; no myalgias, joint pain, or weakness Hematologic: Negative; no easy bruising, bleeding Endocrine: Positive for diabetes mellitus Neuro: Negative; no changes in balance, headaches Skin: Negative; No rashes or skin lesions Psychiatric: Negative; No behavioral problems, depression Sleep: Negative; No snoring,  daytime sleepiness, hypersomnolence, bruxism, restless legs, hypnogognic hallucinations. Other comprehensive 14 point system review is negative   Physical Exam BP (!) 150/54   Pulse (!) 56   Ht '5\' 7"'$  (1.702 m)   Wt 127 lb 6.4 oz (57.8 kg)   BMI 19.95 kg/m    Repeat blood pressure by me was 144/66  Wt Readings from Last 3 Encounters:  08/03/17 127 lb 6.4 oz (57.8 kg)  03/08/17 128 lb (58.1 kg)  09/07/16 132 lb (59.9 kg)   General: Alert, oriented, no distress.  Skin: normal turgor, no rashes, warm and dry HEENT: Normocephalic, atraumatic. Pupils equal round and reactive to light; sclera anicteric; extraocular muscles intact; Nose without nasal septal hypertrophy Mouth/Parynx benign; Mallinpatti scale 2 Neck: No JVD, no carotid bruits; normal carotid upstroke Lungs: clear to ausculatation and percussion; no wheezing or rales Chest wall: without tenderness to palpitation Heart: PMI not displaced, RRR, s1 s2 normal, 1/6 systolic murmur, no diastolic murmur, no rubs, gallops, thrills, or heaves Abdomen: soft, nontender; no hepatosplenomehaly, BS+; abdominal aorta nontender and not dilated by palpation. Back: no CVA tenderness Pulses 2+ Musculoskeletal: full range of motion, normal strength, no joint deformities Extremities: no clubbing cyanosis or edema, Homan's sign negative  Neurologic: grossly nonfocal; Cranial nerves grossly wnl Psychologic: Normal mood and affect  ECG (independently read by me): Sinus  bradycardia 56 bpm.  First-degree AV block with a PR interval at 242 ms.  Left bundle branch block.  May 2018 ECG (independently read by me): Sinus bradycardia at 59 bpm with PACs and sinus arrhythmia.  Left axis deviation.  Left bundle branch  block with repolarization changes.  First-degree AV block with a PR interval at 288 ms.  QTc interval 449 ms.  November 2017 ECG (independently read by me): Probable sinus rhythm with interventricular conduction delay.  Heart rate 58 bpm.  June 2017 ECG (independently read by me): Normal sinus rhythm at 72 bpm with first-degree AV block.  PR interval 234 ms.  LABS:  BMP Latest Ref Rng & Units 07/20/2017 09/07/2016 06/10/2015  Glucose 65 - 99 mg/dL 141(H) 105(H) 123(H)  BUN 7 - 25 mg/dL 18 14 24(H)  Creatinine 0.70 - 1.11 mg/dL 0.73 0.67(L) 0.85  BUN/Creat Ratio 6 - 22 (calc) NOT APPLICABLE - -  Sodium 361 - 146 mmol/L 139 135 135  Potassium 3.5 - 5.3 mmol/L 4.3 4.1 4.6  Chloride 98 - 110 mmol/L 103 100 100  CO2 20 - 32 mmol/L '30 27 29  '$ Calcium 8.6 - 10.3 mg/dL 8.9 9.0 9.1     Hepatic Function Latest Ref Rng & Units 07/20/2017 09/07/2016 02/13/2014  Total Protein 6.1 - 8.1 g/dL 6.6 6.4 7.0  Albumin 3.6 - 5.1 g/dL - 3.9 3.8  AST 10 - 35 U/L '16 16 19  '$ ALT 9 - 46 U/L '13 11 14  '$ Alk Phosphatase 40 - 115 U/L - 58 54  Total Bilirubin 0.2 - 1.2 mg/dL 0.7 0.7 0.8  Bilirubin, Direct 0.0 - 0.3 mg/dL - - 0.1    CBC Latest Ref Rng & Units 07/20/2017 09/07/2016 06/10/2015  WBC 3.8 - 10.8 Thousand/uL 6.4 7.9 8.6  Hemoglobin 13.2 - 17.1 g/dL 10.9(L) 10.0(L) 10.4(L)  Hematocrit 38.5 - 50.0 % 36.3(L) 31.0(L) 33.9(L)  Platelets 140 - 400 Thousand/uL 227 234 212.0   Lab Results  Component Value Date   MCV 60.3 (L) 07/20/2017   MCV 58.4 (L) 09/07/2016   MCV 61.0 (L) 06/10/2015    Lab Results  Component Value Date   TSH 1.41 07/20/2017    BNP No results found for: BNP  ProBNP    Component Value Date/Time   PROBNP 137.0 (H) 06/10/2015 1504      Lipid Panel     Component Value Date/Time   CHOL 114 07/20/2017 0949   TRIG 93 07/20/2017 0949   HDL 51 07/20/2017 0949   CHOLHDL 2.2 07/20/2017 0949   VLDL 10 09/07/2016 1027   LDLCALC 45 09/07/2016 1027     RADIOLOGY: No results found.  IMPRESSION:  1. Essential hypertension   2. Coronary artery disease involving native coronary artery of native heart with angina pectoris ([place])   3. Hx of CABG   4. Type 2 diabetes mellitus with complication, without long-term current use of insulin ([place])   5. Grade II diastolic dysfunction   6. Gastroesophageal reflux disease without esophagitis     ASSESSMENT AND PLAN: [name] is an 83 year-old gentleman who underwent CABG surgery in 2005.  He developed symptom unstable angina  in July 2016 was found to have a 99% stenosis in the vein graft supplying the circumflex marginal vessel just at the distal anastomosis.  This was successfully stented.  He has not had any recurrent anginal therapy since and continues to feel well.  An echo Doppler study from 11/04/2016 which was done when he experienced some shortness of breath after an episode of bronchitis revealed mild LVH with EF of 50-55% and grade 2 diastolic dysfunction.  There was aortic valve sclerosis without stenosis, with biatrial enlargement and mild pulmonary hypertension with a PA pressure peaked 36 mm.  His blood pressure  today is elevated.  I have suggested further titration of losartan from 25 g daily to 25 mg twice a day.  He continues to be on pravastatin 80 mg for hyperlipidemia.  Most recent lipid panel was reviewed, and LDL was excellent at 45.  He continues to be on dual antiplatelet therapy.  He has type 2 diabetes mellitus and may have developed some mild low blood sugar leading to discontinuance of glimepiride.  Most recent blood sugar was 140.  He will follow-up with Dr. Rosana [name] regarding this.  He takes furosemide on an as-needed basis 10 mg.  His GERD is controlled with  pantoprazole.  I will see him in 6 months for cardiology reevaluation.  Time spent: 25 minutes Troy Sine, MD, [facility]  08/04/2017 8:44 AM

## 2017-12-06 ENCOUNTER — Ambulatory Visit (INDEPENDENT_AMBULATORY_CARE_PROVIDER_SITE_OTHER): Payer: BLUE CROSS/BLUE SHIELD | Admitting: Cardiovascular Disease

## 2017-12-06 ENCOUNTER — Encounter: Payer: Self-pay | Admitting: Cardiovascular Disease

## 2017-12-06 VITALS — BP 128/56 | HR 54 | Ht 66.5 in | Wt 127.2 lb

## 2017-12-06 DIAGNOSIS — I519 Heart disease, unspecified: Secondary | ICD-10-CM | POA: Diagnosis not present

## 2017-12-06 DIAGNOSIS — E785 Hyperlipidemia, unspecified: Secondary | ICD-10-CM

## 2017-12-06 DIAGNOSIS — I25119 Atherosclerotic heart disease of native coronary artery with unspecified angina pectoris: Secondary | ICD-10-CM | POA: Diagnosis not present

## 2017-12-06 DIAGNOSIS — I5189 Other ill-defined heart diseases: Secondary | ICD-10-CM

## 2017-12-06 DIAGNOSIS — I10 Essential (primary) hypertension: Secondary | ICD-10-CM

## 2017-12-06 DIAGNOSIS — Z951 Presence of aortocoronary bypass graft: Secondary | ICD-10-CM | POA: Diagnosis not present

## 2017-12-06 NOTE — Patient Instructions (Signed)
Medication Instructions:  Your physician recommends that you continue on your current medications as directed. Please refer to the Current Medication list given to you today.  Follow-Up: Your physician wants you to follow-up in: 6 months with Dr. Kelly.  You will receive a reminder letter in the mail two months in advance. If you don't receive a letter, please call our office to schedule the follow-up appointment.      If you need a refill on your cardiac medications before your next appointment, please call your pharmacy.   

## 2017-12-06 NOTE — Progress Notes (Signed)
Patient ID: Dakota [name], male   DOB: 06-May-1926, 84 y.o.   MRN: [id number]    Primary M.D.: Dr. Delfino Lovett Tisovic  HPI: [name] is a 84 y.o. male is a former patient of Dr. Mare Ferrari.  He presents for a 4 month follow-up evaluation.  [name] has CAD and underwent CABG revascularization surgery in 2005 with a LIMA to the LAD, SVG to the diagonal, SVG to the obtuse marginal, and SVG to his distal RCA.  He has been demonstrated to have combined systolic and diastolic heart failure, left bundle branch block and diabetes mellitus.  In 2013.  An echo Doppler study showed an EF of 50-55% with moderate diastolic dysfunction.  In July 2016 he developed symptoms worrisome for unstable angina.  A nuclear perfusion study was abnormal and demonstrated inferior to inferolateral ischemia.  He underwent cardiac catheterization by me on 05/21/2015.  This revealed low normal global LV function with an EF of 50-55%.  There was significant native CAD with 95% distal left main stenosis, proximal occlusion of the LAD after the first 2 septal perforating arteries, total occlusion of the circumflex at the ostium, and RCA stenoses, 50% proximally, 50% mid, and total occlusion of the distal RCA after a small PDA-like vessel.  He had a patent LIMA graft supplying the mid LAD.  The vein graft supplying the circumflex marginal had a 99% distal anastomosis stenosis.  He had a patent vein graft supplying the diagonal vessel and a pain vein graft supplying the distal RCA with some collateralization of the AV groove circumflex from the PLA vessel. I performed successful intervention to the vein graft supplying the obtuse marginal vessel and a 3.016 mm Synergy DES stent postdilated to 3.43 mm was inserted with resumption of brisk TIMI-3 flow and 0 residual stenosis.  This procedure was complicated by a small pseudoaneurysm which essentially self thrombosed and he did not require any thrombin injection.  He last saw Dr. Mare Ferrari in  January 2017.   He established cardiology care with me in the office setting on 03/26/2016.  He denies any episodes of chest pain.  The patient has remained fairly active.  He still works at [employer] 4 days per week for 8 hours shifts.  He was treated for bronchitis.  He has noticed some mild shortness of breath with activity. He is unaware of palpitations.  He denies significant leg swelling.  He has not had recent blood work.   He underwent a repeat echo Doppler study on 11/04/2016.  This demonstrated normal systolic function with an EF of 50-55% with mild LVH and no regional wall motion abnormalities.  He had grade 2 diastolic dysfunction.  There was moderate aortic valve sclerosis without stenosis, moderate left atrial dilatation, mild right atrial dilatation, and mild pulmonary hypertension with a PA estimated pressure 36 mm.  6 months, he continues to do well.  He again denies recurrent anginal type symptoms.  I reviewed lab work done by Dr. Rosana Hoes on December 07, 2016.  Total cholesterol was 124, triglycerides 65, HDL 46, and LDL 65.  Glucose was 101.  Creatinine was 0.9.  When I saw him in May 2018 he was feeling  well from a cardiac standpoint.  He was taken off Amaryl by Dr. Rosana Hoes.  Subsequent lab work showed glucose increased at 141.  He was mildly anemic with a hemoglobin of 10.9, hematocrit 36.3.  Lipid studies were excellent with a total cluster 114, HDL 51, triglycerides 93, LDL 45.  He continues to work at [employer] from 9 AM until 6 PM on Thursday, Friday, Saturday, and Sundays.  He denies shortness of breath.   Since I last saw him, he has continued to do well and continues to work several days per week.  He has noticed occasional weakness and intermittently walks with a cane.  In December he fell at [facility].  He denied any associated presyncope or episodes of tachycardia.  He denies recent chest pain.  He is unaware of palpitations.  He admits that he is getting weaker.  He presents for  reevaluation.  Past Medical History:  Diagnosis Date  . Anginal pain ([place])   . Arthritis   . Coronary artery disease   . Diabetes mellitus   . GERD (gastroesophageal reflux disease)   . History of cardiovascular stress test    Lexiscan Myoview 7/16:  EF 51%, inferior, inferolateral ischemia; Intermediate Risk  . Hypertension   . Ischemic heart disease    a. s/p CABG;  b.  LHC 05/23/15:  S-RPAVB ok, S-D2 ok, S-OM2 99% (s/p Synergy DES), L-LAD ok, EF normal (complicated by pseudoaneurysm)   . Shortness of breath dyspnea   . Smallpox     Past Surgical History:  Procedure Laterality Date  . APPENDECTOMY    . [place]   normal  ([place])  . CARDIAC CATHETERIZATION N/A 05/23/2015   Procedure: Left Heart Cath and Cors/Grafts Angiography;  Surgeon: Troy Sine, MD;  Location: [facility];  Service: Cardiovascular;  Laterality: N/A;  . CARDIAC CATHETERIZATION N/A 05/23/2015   Procedure: Coronary Stent Intervention;  Surgeon: Troy Sine, MD;  Location: [facility];  Service: Cardiovascular;  Laterality: N/A;  . CORONARY ARTERY BYPASS GRAFT  12/2003  . DOPPLER ECHOCARDIOGRAPHY  07/15/10   EF 40-45%, aortic stenosis  . FOOT SURGERY Right   . HERNIA REPAIR Bilateral 1979  . PERCUTANEOUS CORONARY STENT INTERVENTION (PCI-S)  05/23/2015   des    ptca svg    Allergies  Allergen Reactions  . Sulfa Antibiotics Swelling  . Pork-Derived Products     muslim  . Aceon [Perindopril Erbumine] Cough    Current Outpatient Medications  Medication Sig Dispense Refill  . Acetaminophen (TYLENOL PO) Take 500 mg by mouth daily as needed (for pain).     [name] albuterol (PROVENTIL HFA;VENTOLIN HFA) 108 (90 Base) MCG/ACT inhaler Inhale 2 puffs into the lungs every 4 (four) hours as needed for wheezing or shortness of breath. 1 Inhaler 0  . aspirin EC 81 MG tablet Take 81 mg by mouth daily.    . clopidogrel (PLAVIX) 75 MG tablet TAKE ONE TABLET BY MOUTH ONCE DAILY WITH   BREAKFAST 90 tablet 1  . furosemide (LASIX) 40 MG tablet Take 0.5 tablets (20 mg total) by mouth as needed. 90 tablet 3  . glucose 4 GM chewable tablet Chew 16 g by mouth as needed for low blood sugar.     . losartan (COZAAR) 50 MG tablet Take 0.5 tablets (25 mg total) by mouth 2 (two) times daily. 90 tablet 3  . mirabegron ER (MYRBETRIQ) 50 MG TB24 tablet Take 50 mg by mouth daily.    . Multiple Vitamins-Minerals (PRESERVISION AREDS 2 PO) Take 2 tablets by mouth daily.     [name] NITROSTAT 0.4 MG SL tablet Place 1 tablet (0.4 mg total) under the tongue every 5 (five) minutes as needed (for chest pain). 25 tablet 1  . ONE TOUCH ULTRA TEST test strip  1 each by Other route as needed (glucose monoring).     . pantoprazole (PROTONIX) 40 MG tablet Take 1 tablet (40 mg total) by mouth daily. 90 tablet 3  . pravastatin (PRAVACHOL) 80 MG tablet Take 80 mg by mouth at bedtime.     [name] azithromycin (ZITHROMAX) 250 MG tablet Take 2 tabs PO x 1 dose, then 1 tab PO QD x 4 days 6 tablet 0  . HYDROcodone-homatropine (HYDROMET) 5-1.5 MG/5ML syrup Take 5 mLs by mouth every 6 (six) hours as needed for cough. 60 mL 0   Current Facility-Administered Medications  Medication Dose Route Frequency Provider Last Rate Last Dose  . midazolam (VERSED) injection 2 mg  2 mg Intravenous Once Lorretta Harp, MD      . Normal Saline Flush 0.9 % SOLN   Intravenous Once Lorretta Harp, MD      . [place] STOPCOCK MISC   Does not apply Once Lorretta Harp, MD        Social History   Socioeconomic History  . Marital status: Married    Spouse name: Not on file  . Number of children: 2 d  . Years of education: Not on file  . Highest education level: Not on file  Social Needs  . Financial resource strain: Not on file  . Food insecurity - worry: Not on file  . Food insecurity - inability: Not on file  . Transportation needs - medical: Not on file  . Transportation needs - non-medical: Not on file  Occupational  History  . Occupation: accountant    Comment: [employer]  Tobacco Use  . Smoking status: Never Smoker  . Smokeless tobacco: Never Used  Substance and Sexual Activity  . Alcohol use: No    Alcohol/week: 0.0 oz  . Drug use: No  . Sexual activity: Not on file  Other Topics Concern  . Not on file  Social History Narrative  . Not on file    Additional social history is notable in that he had worked at the [place] [employer] for 30 years in the [place].  Family History  Problem Relation Age of Onset  . Healthy Father   . Ulcers Mother   . Stroke Mother    Both parents are deceased.  ROS General: Negative; No fevers, chills, or night sweats HEENT: Negative; No changes in vision or hearing, sinus congestion, difficulty swallowing Pulmonary: Positive for remote bronchitis Cardiovascular: See HPI: No chest pain, presyncope, syncope, palpatations GI: Negative; No nausea, vomiting, diarrhea, or abdominal pain GU: Negative; No dysuria, hematuria, or difficulty voiding Musculoskeletal: Negative; no myalgias, joint pain, or weakness Hematologic: Negative; no easy bruising, bleeding Endocrine: Positive for diabetes mellitus Neuro: Negative; no changes in balance, headaches Skin: Negative; No rashes or skin lesions Psychiatric: Negative; No behavioral problems, depression Sleep: Negative; No snoring,  daytime sleepiness, hypersomnolence, bruxism, restless legs, hypnogognic hallucinations. Other comprehensive 14 point system review is negative   Physical Exam BP (!) 128/56   Pulse (!) 54   Ht 5' 6.5" (1.689 m)   Wt 127 lb 3.2 oz (57.7 kg)   BMI 20.22 kg/m    Repeat blood pressure by me was 142/60 supine and 148/68 standing  Wt Readings from Last 3 Encounters:  12/06/17 127 lb 3.2 oz (57.7 kg)  08/03/17 127 lb 6.4 oz (57.8 kg)  03/08/17 128 lb (58.1 kg)   General: Alert, oriented, no distress.  Skin: normal turgor, no rashes, warm and dry HEENT:  Normocephalic,  atraumatic. Pupils equal round and reactive to light; sclera anicteric; extraocular muscles intact;  Nose without nasal septal hypertrophy Mouth/Parynx benign; Mallinpatti scale 2 Neck: No JVD, no carotid bruits; normal carotid upstroke Lungs: clear to ausculatation and percussion; no wheezing or rales Chest wall: without tenderness to palpitation Heart: PMI not displaced, RRR, s1 s2 normal, 1/6 systolic murmur, no diastolic murmur, no rubs, gallops, thrills, or heaves Abdomen: soft, nontender; no hepatosplenomehaly, BS+; abdominal aorta nontender and not dilated by palpation. Back: no CVA tenderness Pulses 2+ Musculoskeletal: full range of motion, normal strength, no joint deformities Extremities: no clubbing cyanosis or edema, Homan's sign negative  Neurologic: grossly nonfocal; Cranial nerves grossly wnl Psychologic: Normal mood and affect   ECG (independently read by me): Sinus bradycardia 54 bpm with mild sinus arrhythmia.  First grade AV block with a PR interval at 240 ms.  Nonspecific interventricular block  October 2018 ECG (independently read by me): Sinus bradycardia 56 bpm.  First-degree AV block with a PR interval at 242 ms.  Left bundle branch block.  May 2018 ECG (independently read by me): Sinus bradycardia at 59 bpm with PACs and sinus arrhythmia.  Left axis deviation.  Left bundle branch block with repolarization changes.  First-degree AV block with a PR interval at 288 ms.  QTc interval 449 ms.  November 2017 ECG (independently read by me): Probable sinus rhythm with interventricular conduction delay.  Heart rate 58 bpm.  June 2017 ECG (independently read by me): Normal sinus rhythm at 72 bpm with first-degree AV block.  PR interval 234 ms.  LABS:  BMP Latest Ref Rng & Units 07/20/2017 09/07/2016 06/10/2015  Glucose 65 - 99 mg/dL 141(H) 105(H) 123(H)  BUN 7 - 25 mg/dL 18 14 24(H)  Creatinine 0.70 - 1.11 mg/dL 0.73 0.67(L) 0.85  BUN/Creat Ratio 6 - 22  (calc) NOT APPLICABLE - -  Sodium 778 - 146 mmol/L 139 135 135  Potassium 3.5 - 5.3 mmol/L 4.3 4.1 4.6  Chloride 98 - 110 mmol/L 103 100 100  CO2 20 - 32 mmol/L _0 Calcium 8.6 - 10.3 mg/dL 8.9 9.0 9.1     Hepatic Function Latest Ref Rng & Units 07/20/2017 09/07/2016 02/13/2014  Total Protein 6.1 - 8.1 g/dL 6.6 6.4 7.0  Albumin 3.6 - 5.1 g/dL - 3.9 3.8  AST 10 - 35 U/L _1 ALT 9 - 46 U/L _2 Alk Phosphatase 40 - 115 U/L - 58 54  Total Bilirubin 0.2 - 1.2 mg/dL 0.7 0.7 0.8  Bilirubin, Direct 0.0 - 0.3 mg/dL - - 0.1    CBC Latest Ref Rng & Units 07/20/2017 09/07/2016 06/10/2015  WBC 3.8 - 10.8 Thousand/uL 6.4 7.9 8.6  Hemoglobin 13.2 - 17.1 g/dL 10.9(L) 10.0(L) 10.4(L)  Hematocrit 38.5 - 50.0 % 36.3(L) 31.0(L) 33.9(L)  Platelets 140 - 400 Thousand/uL 227 234 212.0   Lab Results  Component Value Date   MCV 60.3 (L) 07/20/2017   MCV 58.4 (L) 09/07/2016   MCV 61.0 (L) 06/10/2015    Lab Results  Component Value Date   TSH 1.41 07/20/2017    BNP No results found for: BNP  ProBNP    Component Value Date/Time   PROBNP 137.0 (H) 06/10/2015 1504     Lipid Panel     Component Value Date/Time   CHOL 114 07/20/2017 0949   TRIG 93 07/20/2017 0949   HDL 51 07/20/2017 0949   CHOLHDL 2.2 07/20/2017 0949   VLDL 10 09/07/2016  [place] 09/07/2016 1027     RADIOLOGY: No results found.  IMPRESSION:  1. Coronary artery disease involving native coronary artery of native heart with angina pectoris ([place])   2. Hx of CABG   3. Grade II diastolic dysfunction   4. Hyperlipidemia with target LDL less than 70   5. Essential hypertension     ASSESSMENT AND PLAN: [name] is an 84 year-old gentleman who underwent CABG surgery in 2005.  He developed  unstable angina  in July 2016 was found to have a 99% stenosis in the vein graft supplying the circumflex marginal vessel just at the distal anastomosis.  This was successfully stented.  He has not had any  recurrent anginal therapy since and continues to feel well.  An echo Doppler study from 11/04/2016 which was done when he experienced some shortness of breath after an episode of bronchitis revealed mild LVH with EF of 50-55% and grade 2 diastolic dysfunction.  Tchere was aortic valve sclerosis without stenosis, with biatrial enlargement and mild pulmonary hypertension with a PA pressure peaked 36 mm.  his blood pressure today is elevated and I'm increasing losartan from 25 mg twice a day up to 50 mg in the morning and 25 mg at night.  He has been taking furosemide 20 mg daily.  There is no edema.  I am suggesting he reduce this to every other day.  He has not had recurrent anginal symptoms.  He continues to be on dual antiplatelet therapy with aspirin, Plavix and denies any bleeding.  He has become more frail.  He is walking occasionally with a cane .  He is here in the office today with a daughter who lives locally.  Another daughter lives in [place] and is been trying to pressure him to fly to [place].  The patient fails he is too weak to undertake this long journey to travel to [place] and then subsequently to [place].  I concur with the patient and have recommended that he not travel abroad, but only travel locally.  He continues to be on pravastatin 80 mg for hyperlipidemia.  Target LDL is less than 70.  Most recent LDL in September 2018 was excellent at 105.  As long as he remains stable I will see him in 6 months for reevaluation  Troy Sine, MD, [facility]  12/08/2017 10:15 PM

## 2017-12-08 ENCOUNTER — Encounter (HOSPITAL_COMMUNITY): Payer: Self-pay | Admitting: Family Medicine

## 2017-12-08 ENCOUNTER — Other Ambulatory Visit: Payer: Self-pay

## 2017-12-08 ENCOUNTER — Encounter: Payer: Self-pay | Admitting: Cardiovascular Disease

## 2017-12-08 ENCOUNTER — Ambulatory Visit (HOSPITAL_COMMUNITY)
Admission: EM | Admit: 2017-12-08 | Discharge: 2017-12-08 | Disposition: A | Discharge status: Home / Self Care | Payer: BLUE CROSS/BLUE SHIELD | Attending: Family Medicine | Admitting: Family Medicine

## 2017-12-08 DIAGNOSIS — J40 Bronchitis, not specified as acute or chronic: Secondary | ICD-10-CM | POA: Diagnosis not present

## 2017-12-08 MED ORDER — AZITHROMYCIN 250 MG PO TABS: ORAL_TABLET | ORAL | 0 refills | Status: DC

## 2017-12-08 MED ORDER — HYDROCODONE-HOMATROPINE 5-1.5 MG/5ML PO SYRP: 5.0000 mL | ORAL_SOLUTION | Freq: Four times a day (QID) | ORAL | 0 refills | Status: DC | PRN

## 2017-12-08 NOTE — ED Provider Notes (Signed)
[place]   [id number] 12/08/17 Arrival Time: 1130   SUBJECTIVE:  Dakota [name] is a 84 y.o. male who presents to the urgent care with complaint of cold symptoms.  For 5 days he had sinus congestion and fullness in his face, and coughing.  He has not had much of a fever or abdominal symptoms.  I am trying to tell you  He has a history of coronary disease along with diabetes and hypertension.   This is not a problem for a week  Past Medical History:  Diagnosis Date  . Anginal pain ([place])   . Arthritis   . Coronary artery disease   . Diabetes mellitus   . GERD (gastroesophageal reflux disease)   . History of cardiovascular stress test    Lexiscan Myoview 7/16:  EF 51%, inferior, inferolateral ischemia; Intermediate Risk  . Hypertension   . Ischemic heart disease    a. s/p CABG;  b.  LHC 05/23/15:  S-RPAVB ok, S-D2 ok, S-OM2 99% (s/p Synergy DES), L-LAD ok, EF normal (complicated by pseudoaneurysm)   . Shortness of breath dyspnea   . Smallpox    Family History  Problem Relation Age of Onset  . Healthy Father   . Ulcers Mother   . Stroke Mother    Social History   Socioeconomic History  . Marital status: Married    Spouse name: Not on file  . Number of children: 2 d  . Years of education: Not on file  . Highest education level: Not on file  Social Needs  . Financial resource strain: Not on file  . Food insecurity - worry: Not on file  . Food insecurity - inability: Not on file  . Transportation needs - medical: Not on file  . Transportation needs - non-medical: Not on file  Occupational History  . Occupation: accountant    Comment: [employer]  Tobacco Use  . Smoking status: Never Smoker  . Smokeless tobacco: Never Used  Substance and Sexual Activity  . Alcohol use: No    Alcohol/week: 0.0 oz  . Drug use: No  . Sexual activity: Not on file  Other Topics Concern  . Not on file  Social History Narrative  . Not on file   Current  Facility-Administered Medications for the 12/08/17 encounter [facility] Encounter)  Medication  . midazolam (VERSED) injection 2 mg  . Normal Saline Flush 0.9 % SOLN  . SOLUTION-PLUS STOPCOCK MISC   No outpatient medications have been marked as taking for the 12/08/17 encounter [facility] Encounter).   Allergies  Allergen Reactions  . Sulfa Antibiotics Swelling  . Pork-Derived Products     muslim  . Aceon [Perindopril Erbumine] Cough      ROS: As per HPI, remainder of ROS negative.   OBJECTIVE:   Vitals:   12/08/17 1234  BP: (!) 176/69  Pulse: 66  Resp: 18  Temp: 98.4 F (36.9 C)  SpO2: 96%     General appearance: alert; no distress Eyes: PERRL; EOMI; conjunctiva normal HENT: normocephalic; atraumatic; TMs normal, canal normal, external ears normal without trauma; nasal mucosa normal; oral mucosa normal Neck: supple Lungs: Few bibasilar rales which are worse on the right Heart: regular rate and rhythm Back: no CVA tenderness Extremities: no cyanosis or edema; symmetrical with no gross deformities Skin: warm and dry Neurologic: normal gait; grossly normal Psychological: alert and cooperative; normal mood and affect      Labs:  Results for orders placed or performed in visit on 03/08/17  CBC  Result Value Ref Range   WBC 6.4 3.8 - 10.8 Thousand/uL   RBC 6.02 (H) 4.20 - 5.80 Million/uL   Hemoglobin 10.9 (L) 13.2 - 17.1 g/dL   HCT 36.3 (L) 38.5 - 50.0 %   MCV 60.3 (L) 80.0 - 100.0 fL   MCH 18.1 (L) 27.0 - 33.0 pg   MCHC 30.0 (L) 32.0 - 36.0 g/dL   RDW 17.3 (H) 11.0 - 15.0 %   Platelets 227 140 - 400 Thousand/uL   MPV 10.6 7.5 - 12.5 fL  TSH  Result Value Ref Range   TSH 1.41 0.40 - 4.50 mIU/L  Lipid panel  Result Value Ref Range   Cholesterol 114 <200 mg/dL   HDL 51 >40 mg/dL   Triglycerides 93 <150 mg/dL   LDL Cholesterol (Calc) 45 mg/dL (calc)   Total CHOL/HDL Ratio 2.2 <5.0 (calc)   Non-HDL Cholesterol (Calc) 63 <130 mg/dL (calc)  COMPLETE  METABOLIC PANEL WITH GFR  Result Value Ref Range   Glucose, Bld 141 (H) 65 - 99 mg/dL   BUN 18 7 - 25 mg/dL   Creat 0.73 0.70 - 1.11 mg/dL   GFR, Est Non African American 82 > OR = 60 mL/min/1.27m2   GFR, Est African American 95 > OR = 60 mL/min/1.97m2   BUN/Creatinine Ratio NOT APPLICABLE 6 - 22 (calc)   Sodium 139 135 - 146 mmol/L   Potassium 4.3 3.5 - 5.3 mmol/L   Chloride 103 98 - 110 mmol/L   CO2 30 20 - 32 mmol/L   Calcium 8.9 8.6 - 10.3 mg/dL   Total Protein 6.6 6.1 - 8.1 g/dL   Albumin 4.2 3.6 - 5.1 g/dL   Globulin 2.4 1.9 - 3.7 g/dL (calc)   AG Ratio 1.8 1.0 - 2.5 (calc)   Total Bilirubin 0.7 0.2 - 1.2 mg/dL   Alkaline phosphatase (APISO) 67 40 - 115 U/L   AST 16 10 - 35 U/L   ALT 13 9 - 46 U/L    Labs Reviewed - No data to display  No results found.     ASSESSMENT & PLAN:  1. Bronchitis     Meds ordered this encounter  Medications  . azithromycin (ZITHROMAX) 250 MG tablet    Sig: Take 2 tabs PO x 1 dose, then 1 tab PO QD x 4 days    Dispense:  6 tablet    Refill:  0  . HYDROcodone-homatropine (HYDROMET) 5-1.5 MG/5ML syrup    Sig: Take 5 mLs by mouth every 6 (six) hours as needed for cough.    Dispense:  60 mL    Refill:  0    Reviewed expectations re: course of current medical issues. Questions answered. Outlined signs and symptoms indicating need for more acute intervention. Patient verbalized understanding. After Visit Summary given.      Robyn Haber, MD 12/08/17 1240

## 2017-12-08 NOTE — ED Triage Notes (Signed)
Pt c/o cough, facial pain, chest congestion. x5 days

## 2018-01-09 ENCOUNTER — Other Ambulatory Visit: Payer: Self-pay | Admitting: Cardiovascular Disease

## 2018-01-10 NOTE — Telephone Encounter (Signed)
REFILL 

## 2018-04-09 ENCOUNTER — Emergency Department (HOSPITAL_COMMUNITY): Payer: BLUE CROSS/BLUE SHIELD

## 2018-04-09 ENCOUNTER — Observation Stay (HOSPITAL_COMMUNITY)
Admission: EM | Admit: 2018-04-09 | Discharge: 2018-04-11 | Disposition: A | Discharge status: Home / Self Care | Payer: BLUE CROSS/BLUE SHIELD | Attending: Internal Medicine | Admitting: Internal Medicine

## 2018-04-09 ENCOUNTER — Encounter (HOSPITAL_COMMUNITY): Payer: Self-pay | Admitting: Emergency Medicine

## 2018-04-09 ENCOUNTER — Telehealth: Payer: Self-pay | Admitting: Cardiology

## 2018-04-09 DIAGNOSIS — Z882 Allergy status to sulfonamides status: Secondary | ICD-10-CM | POA: Diagnosis not present

## 2018-04-09 DIAGNOSIS — I110 Hypertensive heart disease with heart failure: Secondary | ICD-10-CM | POA: Diagnosis not present

## 2018-04-09 DIAGNOSIS — I440 Atrioventricular block, first degree: Secondary | ICD-10-CM | POA: Insufficient documentation

## 2018-04-09 DIAGNOSIS — I209 Angina pectoris, unspecified: Secondary | ICD-10-CM | POA: Diagnosis present

## 2018-04-09 DIAGNOSIS — J449 Chronic obstructive pulmonary disease, unspecified: Secondary | ICD-10-CM | POA: Diagnosis present

## 2018-04-09 DIAGNOSIS — Z955 Presence of coronary angioplasty implant and graft: Secondary | ICD-10-CM | POA: Insufficient documentation

## 2018-04-09 DIAGNOSIS — I25810 Atherosclerosis of coronary artery bypass graft(s) without angina pectoris: Secondary | ICD-10-CM | POA: Diagnosis not present

## 2018-04-09 DIAGNOSIS — E7800 Pure hypercholesterolemia, unspecified: Secondary | ICD-10-CM | POA: Diagnosis present

## 2018-04-09 DIAGNOSIS — Z7982 Long term (current) use of aspirin: Secondary | ICD-10-CM | POA: Insufficient documentation

## 2018-04-09 DIAGNOSIS — M1990 Unspecified osteoarthritis, unspecified site: Secondary | ICD-10-CM | POA: Diagnosis not present

## 2018-04-09 DIAGNOSIS — Z7902 Long term (current) use of antithrombotics/antiplatelets: Secondary | ICD-10-CM | POA: Diagnosis not present

## 2018-04-09 DIAGNOSIS — I447 Left bundle-branch block, unspecified: Secondary | ICD-10-CM | POA: Diagnosis not present

## 2018-04-09 DIAGNOSIS — I5042 Chronic combined systolic (congestive) and diastolic (congestive) heart failure: Secondary | ICD-10-CM | POA: Insufficient documentation

## 2018-04-09 DIAGNOSIS — I10 Essential (primary) hypertension: Secondary | ICD-10-CM | POA: Diagnosis present

## 2018-04-09 DIAGNOSIS — I25119 Atherosclerotic heart disease of native coronary artery with unspecified angina pectoris: Secondary | ICD-10-CM

## 2018-04-09 DIAGNOSIS — Z951 Presence of aortocoronary bypass graft: Secondary | ICD-10-CM

## 2018-04-09 DIAGNOSIS — K219 Gastro-esophageal reflux disease without esophagitis: Secondary | ICD-10-CM | POA: Diagnosis not present

## 2018-04-09 DIAGNOSIS — I729 Aneurysm of unspecified site: Secondary | ICD-10-CM

## 2018-04-09 DIAGNOSIS — R079 Chest pain, unspecified: Secondary | ICD-10-CM | POA: Diagnosis present

## 2018-04-09 DIAGNOSIS — R0789 Other chest pain: Secondary | ICD-10-CM | POA: Insufficient documentation

## 2018-04-09 DIAGNOSIS — E119 Type 2 diabetes mellitus without complications: Secondary | ICD-10-CM

## 2018-04-09 LAB — BASIC METABOLIC PANEL
Anion gap: 10 (ref 5–15)
BUN: 24 mg/dL — AB (ref 6–20)
CHLORIDE: 103 mmol/L (ref 101–111)
CO2: 28 mmol/L (ref 22–32)
CREATININE: 0.89 mg/dL (ref 0.61–1.24)
Calcium: 9 mg/dL (ref 8.9–10.3)
GFR calc non Af Amer: >60 mL/min (ref >60)
Glucose, Bld: 152 mg/dL — ABNORMAL HIGH (ref 65–99)
POTASSIUM: 4.6 mmol/L (ref 3.5–5.1)
SODIUM: 141 mmol/L (ref 135–145)

## 2018-04-09 LAB — CBC
HEMATOCRIT: 34.5 % — AB (ref 39.0–52.0)
Hemoglobin: 10.3 g/dL — ABNORMAL LOW (ref 13.0–17.0)
MCH: 18 pg — AB (ref 26.0–34.0)
MCHC: 29.9 g/dL — ABNORMAL LOW (ref 30.0–36.0)
MCV: 60.4 fL — AB (ref 78.0–100.0)
Platelets: 197 10*3/uL (ref 150–400)
RBC: 5.71 MIL/uL (ref 4.22–5.81)
RDW: 16.7 % — ABNORMAL HIGH (ref 11.5–15.5)
WBC: 6.7 10*3/uL (ref 4.0–10.5)

## 2018-04-09 LAB — I-STAT TROPONIN, ED: Troponin i, poc: 0 ng/mL (ref 0.00–0.08)

## 2018-04-09 NOTE — ED Triage Notes (Signed)
Reports left sided chest pain stabbing in nature that started yesterday. Also endorses some nausea.  Used 1 ntg sl at home but reports it make his heart race so he didn't take anymore.  Had 1 81mg  asa this morning.

## 2018-04-09 NOTE — Telephone Encounter (Signed)
Patient called stating that he has been having sharp shooting pains in his left chest that last a few seconds and resolve.  He has taken SL NTG but is not sure if it helped.  INstructed patient to go to ER for further evaluation and his wife agrees to take him

## 2018-04-10 ENCOUNTER — Other Ambulatory Visit: Payer: Self-pay

## 2018-04-10 ENCOUNTER — Observation Stay (HOSPITAL_BASED_OUTPATIENT_CLINIC_OR_DEPARTMENT_OTHER): Payer: BLUE CROSS/BLUE SHIELD

## 2018-04-10 DIAGNOSIS — R0789 Other chest pain: Secondary | ICD-10-CM

## 2018-04-10 DIAGNOSIS — I25709 Atherosclerosis of coronary artery bypass graft(s), unspecified, with unspecified angina pectoris: Secondary | ICD-10-CM

## 2018-04-10 DIAGNOSIS — J449 Chronic obstructive pulmonary disease, unspecified: Secondary | ICD-10-CM

## 2018-04-10 DIAGNOSIS — E1169 Type 2 diabetes mellitus with other specified complication: Secondary | ICD-10-CM | POA: Diagnosis not present

## 2018-04-10 DIAGNOSIS — R079 Chest pain, unspecified: Secondary | ICD-10-CM | POA: Diagnosis not present

## 2018-04-10 LAB — TROPONIN I: Troponin I: <0.03 ng/mL (ref <0.03)

## 2018-04-10 LAB — CREATININE, SERUM
Creatinine, Ser: 0.72 mg/dL (ref 0.61–1.24)
GFR calc Af Amer: >60 mL/min (ref >60)
GFR calc non Af Amer: >60 mL/min (ref >60)

## 2018-04-10 LAB — CBC
HCT: 31.9 % — ABNORMAL LOW (ref 39.0–52.0)
HEMOGLOBIN: 9.6 g/dL — AB (ref 13.0–17.0)
MCH: 18.4 pg — AB (ref 26.0–34.0)
MCHC: 30.1 g/dL (ref 30.0–36.0)
MCV: 61 fL — AB (ref 78.0–100.0)
Platelets: 181 10*3/uL (ref 150–400)
RBC: 5.23 MIL/uL (ref 4.22–5.81)
RDW: 16.5 % — AB (ref 11.5–15.5)
WBC: 5.7 10*3/uL (ref 4.0–10.5)

## 2018-04-10 LAB — ECHOCARDIOGRAM COMPLETE
Height: 67 in
Weight: 1896 oz

## 2018-04-10 LAB — GLUCOSE, CAPILLARY: Glucose-Capillary: 128 mg/dL — ABNORMAL HIGH (ref 65–99)

## 2018-04-10 MED ORDER — FINASTERIDE 5 MG PO TABS
5.0000 mg | ORAL_TABLET | Freq: Every day | ORAL | Status: DC
  Administered 2018-04-10: 5 mg via ORAL
  Filled 2018-04-10: qty 1

## 2018-04-10 MED ORDER — MIRABEGRON ER 50 MG PO TB24
50.0000 mg | ORAL_TABLET | Freq: Every day | ORAL | Status: DC
  Administered 2018-04-10 – 2018-04-11 (×2): 50 mg via ORAL
  Filled 2018-04-10 (×2): qty 1

## 2018-04-10 MED ORDER — HEPARIN SODIUM (PORCINE) 5000 UNIT/ML IJ SOLN: 5000.0000 [IU] | Freq: Three times a day (TID) | INTRAMUSCULAR | Status: DC

## 2018-04-10 MED ORDER — ISOSORBIDE MONONITRATE ER 30 MG PO TB24
30.0000 mg | ORAL_TABLET | Freq: Every day | ORAL | Status: DC
  Administered 2018-04-10 – 2018-04-11 (×2): 30 mg via ORAL
  Filled 2018-04-10 (×2): qty 1

## 2018-04-10 MED ORDER — ASPIRIN EC 81 MG PO TBEC
81.0000 mg | DELAYED_RELEASE_TABLET | Freq: Every day | ORAL | Status: DC
  Administered 2018-04-10 – 2018-04-11 (×2): 81 mg via ORAL
  Filled 2018-04-10 (×2): qty 1

## 2018-04-10 MED ORDER — CLOPIDOGREL BISULFATE 75 MG PO TABS
75.0000 mg | ORAL_TABLET | Freq: Every day | ORAL | Status: DC
  Administered 2018-04-10 – 2018-04-11 (×2): 75 mg via ORAL
  Filled 2018-04-10 (×2): qty 1

## 2018-04-10 MED ORDER — PRAVASTATIN SODIUM 40 MG PO TABS
80.0000 mg | ORAL_TABLET | Freq: Every day | ORAL | Status: DC
  Administered 2018-04-10: 80 mg via ORAL
  Filled 2018-04-10: qty 2

## 2018-04-10 MED ORDER — DOCUSATE SODIUM 100 MG PO CAPS
100.0000 mg | ORAL_CAPSULE | Freq: Every day | ORAL | Status: DC
  Administered 2018-04-10: 100 mg via ORAL
  Filled 2018-04-10: qty 1

## 2018-04-10 MED ORDER — FUROSEMIDE 20 MG PO TABS: 20.0000 mg | ORAL_TABLET | Freq: Every day | ORAL | Status: DC

## 2018-04-10 MED ORDER — ONDANSETRON HCL 4 MG/2ML IJ SOLN: 4.0000 mg | Freq: Four times a day (QID) | INTRAMUSCULAR | Status: DC | PRN

## 2018-04-10 MED ORDER — ALBUTEROL SULFATE (2.5 MG/3ML) 0.083% IN NEBU: 3.0000 mL | INHALATION_SOLUTION | RESPIRATORY_TRACT | Status: DC | PRN

## 2018-04-10 MED ORDER — ACETAMINOPHEN 325 MG PO TABS: 650.0000 mg | ORAL_TABLET | ORAL | Status: DC | PRN

## 2018-04-10 MED ORDER — MIDAZOLAM HCL 2 MG/2ML IJ SOLN: 2.0000 mg | Freq: Once | INTRAMUSCULAR | Status: DC

## 2018-04-10 MED ORDER — PANTOPRAZOLE SODIUM 40 MG PO TBEC
40.0000 mg | DELAYED_RELEASE_TABLET | Freq: Every day | ORAL | Status: DC
  Administered 2018-04-10 – 2018-04-11 (×2): 40 mg via ORAL
  Filled 2018-04-10 (×2): qty 1

## 2018-04-10 MED ORDER — HYDROCORTISONE 0.5 % EX CREA
1.0000 "application " | TOPICAL_CREAM | Freq: Four times a day (QID) | CUTANEOUS | Status: DC | PRN
  Administered 2018-04-11: 1 via TOPICAL
  Filled 2018-04-10: qty 28.35

## 2018-04-10 NOTE — Progress Notes (Signed)
Patient arrived, ambulated to bed. No complaints of pain. Daughter at bedside- both oriented to unit and updated on plan of care. Telemetry applied and verified.

## 2018-04-10 NOTE — Progress Notes (Signed)
Admitted with chest pain. Discussed with Cardiology. Work up is in progress. Further management will depend on hospital course. Follow ECHO report.

## 2018-04-10 NOTE — ED Provider Notes (Signed)
[facility] EMERGENCY DEPARTMENT Provider Note   CSN: [id number] Arrival date & time: 04/09/18  2221     History   Chief Complaint Chief Complaint  Patient presents with  . Chest Pain    HPI Dakota [name] is a 84 y.o. male.   Chest Pain   This is a new problem. The current episode started less than 1 hour ago. The problem occurs daily. The problem has been resolved. The pain is present in the substernal region. The pain is mild. The quality of the pain is described as brief and sharp. Pertinent negatives include no abdominal pain, no back pain, no claudication, no nausea and no near-syncope. He has tried nothing for the symptoms.    Past Medical History:  Diagnosis Date  . Anginal pain ([place])   . Arthritis   . Coronary artery disease   . Diabetes mellitus   . GERD (gastroesophageal reflux disease)   . History of cardiovascular stress test    Lexiscan Myoview 7/16:  EF 51%, inferior, inferolateral ischemia; Intermediate Risk  . Hypertension   . Ischemic heart disease    a. s/p CABG;  b.  LHC 05/23/15:  S-RPAVB ok, S-D2 ok, S-OM2 99% (s/p Synergy DES), L-LAD ok, EF normal (complicated by pseudoaneurysm)   . Shortness of breath dyspnea   . Smallpox     Patient Active Problem List   Diagnosis Date Noted  . Essential hypertension 09/07/2016  . COPD, moderate ([place]) 06/13/2015  . CAD (coronary artery disease) of bypass graft 05/23/2015  . Coronary artery disease involving native coronary artery of native heart with angina pectoris ([place])   . Abnormal nuclear stress test   . Ischemic chest pain   . Cough 09/26/2012  . Malaise and fatigue 04/11/2012  . Hx of CABG 04/03/2011  . Left bundle branch block 04/03/2011  . Diabetes ([place]) 04/03/2011  . Hypercholesterolemia 04/03/2011  . Chronic combined systolic and diastolic congestive heart failure ([place]) 04/03/2011    Past Surgical History:  Procedure Laterality Date  . APPENDECTOMY    . [place]   normal  ([place])  . CARDIAC CATHETERIZATION N/A 05/23/2015   Procedure: Left Heart Cath and Cors/Grafts Angiography;  Surgeon: Troy Sine, MD;  Location: [facility];  Service: Cardiovascular;  Laterality: N/A;  . CARDIAC CATHETERIZATION N/A 05/23/2015   Procedure: Coronary Stent Intervention;  Surgeon: Troy Sine, MD;  Location: [facility];  Service: Cardiovascular;  Laterality: N/A;  . CORONARY ARTERY BYPASS GRAFT  12/2003  . DOPPLER ECHOCARDIOGRAPHY  07/15/10   EF 40-45%, aortic stenosis  . FOOT SURGERY Right   . HERNIA REPAIR Bilateral 1979  . PERCUTANEOUS CORONARY STENT INTERVENTION (PCI-S)  05/23/2015   des    ptca svg        Home Medications    Prior to Admission medications   Medication Sig Start Date End Date Taking? Authorizing Provider  aspirin EC 81 MG tablet Take 81 mg by mouth daily.   Yes [provider]  clopidogrel (PLAVIX) 75 MG tablet Take 1 tablet (75 mg total) by mouth daily. NEED OV. 01/10/18  Yes Troy Sine, MD  docusate sodium (COLACE) 100 MG capsule Take 100 mg by mouth at bedtime.   Yes [provider]  finasteride (PROSCAR) 5 MG tablet Take 5 mg by mouth at bedtime.   Yes [provider]  losartan (COZAAR) 50 MG tablet Take 0.5 tablets (25 mg total) by mouth  2 (two) times daily. 08/03/17  Yes Troy Sine, MD  mirabegron ER (MYRBETRIQ) 50 MG TB24 tablet Take 50 mg by mouth daily.   Yes [provider]  Multiple Vitamin (MULTIVITAMIN WITH MINERALS) TABS tablet Take 1 tablet by mouth daily.   Yes [provider]  Multiple Vitamins-Minerals (PRESERVISION AREDS 2 PO) Take 1 tablet by mouth 2 (two) times daily.    Yes [provider]  NITROSTAT 0.4 MG SL tablet Place 1 tablet (0.4 mg total) under the tongue every 5 (five) minutes as needed (for chest pain). 06/08/16  Yes Troy Sine, MD  pravastatin (PRAVACHOL) 80 MG tablet Take 80 mg by mouth at bedtime.     Yes [provider]  terbinafine (LAMISIL) 250 MG tablet Take 250 mg by mouth daily.   Yes [provider]  albuterol (PROVENTIL HFA;VENTOLIN HFA) 108 (90 Base) MCG/ACT inhaler Inhale 2 puffs into the lungs every 4 (four) hours as needed for wheezing or shortness of breath. Patient not taking: Reported on 04/10/2018 01/03/16   Melony Overly, MD  azithromycin (ZITHROMAX) 250 MG tablet Take 2 tabs PO x 1 dose, then 1 tab PO QD x 4 days Patient not taking: Reported on 04/10/2018 12/08/17   Robyn Haber, MD  furosemide (LASIX) 40 MG tablet Take 0.5 tablets (20 mg total) by mouth as needed. Patient not taking: Reported on 04/10/2018 09/07/16   Troy Sine, MD  HYDROcodone-homatropine (HYDROMET) 5-1.5 MG/5ML syrup Take 5 mLs by mouth every 6 (six) hours as needed for cough. Patient not taking: Reported on 04/10/2018 12/08/17   Robyn Haber, MD  ONE TOUCH ULTRA TEST test strip 1 each by Other route as needed (glucose monoring).  04/21/15   [provider]  pantoprazole (PROTONIX) 40 MG tablet Take 1 tablet (40 mg total) by mouth daily. Patient not taking: Reported on 04/10/2018 05/21/15   Liliane Shi, PA-C    Family History Family History  Problem Relation Age of Onset  . Healthy Father   . Ulcers Mother   . Stroke Mother     Social History Social History   Tobacco Use  . Smoking status: Never Smoker  . Smokeless tobacco: Never Used  Substance Use Topics  . Alcohol use: No    Alcohol/week: 0.0 oz  . Drug use: No     Allergies   Sulfa antibiotics; Pork-derived products; and Aceon [perindopril erbumine]   Review of Systems Review of Systems  Cardiovascular: Positive for chest pain. Negative for claudication and near-syncope.  Gastrointestinal: Negative for abdominal pain and nausea.  Musculoskeletal: Negative for back pain.  All other systems reviewed and are negative.    Physical Exam Updated Vital Signs BP (!) 174/67   Pulse (!) 54   Temp  98.2 F (36.8 C) (Oral)   Resp 14   Ht 5' 6.5" (1.689 m)   Wt 57.6 kg (127 lb)   SpO2 97%   BMI 20.19 kg/m   Physical Exam  Constitutional: He is oriented to person, place, and time. He appears well-developed and well-nourished.  HENT:  Head: Normocephalic and atraumatic.  Eyes: Conjunctivae and EOM are normal.  Neck: Normal range of motion.  Cardiovascular: Normal rate.  Pulmonary/Chest: Effort normal. No respiratory distress.  Abdominal: He exhibits no distension.  Musculoskeletal: Normal range of motion. He exhibits no edema, tenderness or deformity.       Right lower leg: Normal.       Left lower leg: Normal.  Neurological: He  is alert and oriented to person, place, and time.  Skin: Skin is warm and dry.  Nursing note and vitals reviewed.    ED Treatments / Results  Labs (all labs ordered are listed, but only abnormal results are displayed) Labs Reviewed  BASIC METABOLIC PANEL - Abnormal; Notable for the following components:      Result Value   Glucose, Bld 152 (*)    BUN 24 (*)    All other components within normal limits  CBC - Abnormal; Notable for the following components:   Hemoglobin 10.3 (*)    HCT 34.5 (*)    MCV 60.4 (*)    MCH 18.0 (*)    MCHC 29.9 (*)    RDW 16.7 (*)    All other components within normal limits  I-STAT TROPONIN, ED    EKG EKG Interpretation  Date/Time:  Saturday April 09 2018 22:28:16 EDT Ventricular Rate:  63 PR Interval:  240 QRS Duration: 154 QT Interval:  444 QTC Calculation: 454 R Axis:   169 Text Interpretation:  Sinus rhythm with 1st degree A-V block Non-specific intra-ventricular conduction block Possible Lateral infarct , age undetermined Cannot rule out Inferior infarct , age undetermined Abnormal ECG likely LBBB with repol changes Confirmed by Merrily Pew [phone]) on 04/10/2018 3:15:09 AM   Radiology Dg Chest 2 View  Result Date: 04/09/2018 CLINICAL DATA:  Left-sided chest pain, onset yesterday.  Nausea. EXAM:  CHEST - 2 VIEW COMPARISON:  Radiograph 01/25/2017, CT 07/22/2015 FINDINGS: Post median sternotomy and CABG. Mild cardiomegaly and tortuous atherosclerotic aorta, unchanged. Chronic blunting of the costophrenic angles without pleural effusion. No pulmonary edema. No focal airspace disease. No pneumothorax. Scoliotic curvature in degenerative change in the spine. IMPRESSION: 1. Post CABG with stable cardiomegaly and tortuous atherosclerotic thoracic aorta. Aortic Atherosclerosis (ICD10-I70.0). 2. No congestive failure or acute pulmonary process. Electronically Signed   By: Jeb Levering M.D.   On: 04/09/2018 23:19    Procedures Procedures (including critical care time)  Medications Ordered in ED Medications - No data to display   Initial Impression / Assessment and Plan / ED Course  I have reviewed the triage vital signs and the nursing notes.  Pertinent labs & imaging results that were available during my care of the patient were reviewed by me and considered in my medical decision making (see chart for details).     High risk secondary to history of coronary artery disease status post CABG and thrombosis of the grafted vein.  Worsening chest pain daily without associated symptoms could be unstable angina will admit for delta troponins and further work-up.   Final Clinical Impressions(s) / ED Diagnoses   Final diagnoses:  Nonspecific chest pain    ED Discharge Orders    None       Cayleen Benjamin, Corene Cornea, MD 04/10/18 [phone]

## 2018-04-10 NOTE — H&P (Signed)
PCP:   [name], MD   Chief Complaint:  Chest pain  HPI: this is a 84 y/o male with h/o CAD, s/p CABG who presents with c/o left sided chest pain since yesterday.later that day he was still having chest pain. He used nitro which did not help. He describes his pain sharp. His pain was intermittent. Movement did not affect pain. At its worse his pain was 5/10, currently it is 0/10. He reports SOB associated with the chest pain but he is chronically SOB and easily fatigued. She denies diaphoresis. He reports heart palpitation. The patient has a h/o CAD, bypass 2005, stents placed after 2015. He normally does not have chest pains. Patients last LHC was in 2016.  The patient works at [employer] 9 hours a day. History provided by the patient. His  daughter is present at bedside. She brought him to the ER.   Review of Systems:  The patient denies anorexia, fever, weight loss,  Chest pain, vision loss, decreased hearing, hoarseness, chest pain, syncope, dyspnea on exertion, peripheral edema, balance deficits, hemoptysis, abdominal pain, melena, hematochezia, severe indigestion/heartburn, hematuria, incontinence, genital sores, muscle weakness, suspicious skin lesions, transient blindness, difficulty walking, depression, unusual weight change, abnormal bleeding, enlarged lymph nodes, angioedema, and breast masses.  Past Medical History: Past Medical History:  Diagnosis Date  . Anginal pain ([place])   . Arthritis   . Coronary artery disease   . Diabetes mellitus   . GERD (gastroesophageal reflux disease)   . History of cardiovascular stress test    Lexiscan Myoview 7/16:  EF 51%, inferior, inferolateral ischemia; Intermediate Risk  . Hypertension   . Ischemic heart disease    a. s/p CABG;  b.  LHC 05/23/15:  S-RPAVB ok, S-D2 ok, S-OM2 99% (s/p Synergy DES), L-LAD ok, EF normal (complicated by pseudoaneurysm)   . Shortness of breath dyspnea   . Smallpox    Past Surgical History:  Procedure  Laterality Date  . APPENDECTOMY    . [place]   normal  ([place])  . CARDIAC CATHETERIZATION N/A 05/23/2015   Procedure: Left Heart Cath and Cors/Grafts Angiography;  Surgeon: Troy Sine, MD;  Location: [facility];  Service: Cardiovascular;  Laterality: N/A;  . CARDIAC CATHETERIZATION N/A 05/23/2015   Procedure: Coronary Stent Intervention;  Surgeon: Troy Sine, MD;  Location: [facility];  Service: Cardiovascular;  Laterality: N/A;  . CORONARY ARTERY BYPASS GRAFT  12/2003  . DOPPLER ECHOCARDIOGRAPHY  07/15/10   EF 40-45%, aortic stenosis  . FOOT SURGERY Right   . HERNIA REPAIR Bilateral 1979  . PERCUTANEOUS CORONARY STENT INTERVENTION (PCI-S)  05/23/2015   des    ptca svg    Medications: Prior to Admission medications   Medication Sig Start Date End Date Taking? Authorizing Provider  aspirin EC 81 MG tablet Take 81 mg by mouth daily.   Yes [provider]  clopidogrel (PLAVIX) 75 MG tablet Take 1 tablet (75 mg total) by mouth daily. NEED OV. 01/10/18  Yes Troy Sine, MD  docusate sodium (COLACE) 100 MG capsule Take 100 mg by mouth at bedtime.   Yes [provider]  finasteride (PROSCAR) 5 MG tablet Take 5 mg by mouth at bedtime.   Yes [provider]  losartan (COZAAR) 50 MG tablet Take 0.5 tablets (25 mg total) by mouth 2 (two) times daily. 08/03/17  Yes Troy Sine, MD  mirabegron ER (MYRBETRIQ) 50 MG TB24 tablet Take 50 mg by  mouth daily.   Yes [provider]  Multiple Vitamin (MULTIVITAMIN WITH MINERALS) TABS tablet Take 1 tablet by mouth daily.   Yes [provider]  Multiple Vitamins-Minerals (PRESERVISION AREDS 2 PO) Take 1 tablet by mouth 2 (two) times daily.    Yes [provider]  NITROSTAT 0.4 MG SL tablet Place 1 tablet (0.4 mg total) under the tongue every 5 (five) minutes as needed (for chest pain). 06/08/16  Yes Troy Sine, MD  pravastatin (PRAVACHOL) 80 MG tablet Take  80 mg by mouth at bedtime.    Yes [provider]  terbinafine (LAMISIL) 250 MG tablet Take 250 mg by mouth daily.   Yes [provider]  albuterol (PROVENTIL HFA;VENTOLIN HFA) 108 (90 Base) MCG/ACT inhaler Inhale 2 puffs into the lungs every 4 (four) hours as needed for wheezing or shortness of breath. Patient not taking: Reported on 04/10/2018 01/03/16   Melony Overly, MD  azithromycin (ZITHROMAX) 250 MG tablet Take 2 tabs PO x 1 dose, then 1 tab PO QD x 4 days Patient not taking: Reported on 04/10/2018 12/08/17   Robyn Haber, MD  furosemide (LASIX) 40 MG tablet Take 0.5 tablets (20 mg total) by mouth as needed. Patient not taking: Reported on 04/10/2018 09/07/16   Troy Sine, MD  HYDROcodone-homatropine (HYDROMET) 5-1.5 MG/5ML syrup Take 5 mLs by mouth every 6 (six) hours as needed for cough. Patient not taking: Reported on 04/10/2018 12/08/17   Robyn Haber, MD  ONE TOUCH ULTRA TEST test strip 1 each by Other route as needed (glucose monoring).  04/21/15   [provider]  pantoprazole (PROTONIX) 40 MG tablet Take 1 tablet (40 mg total) by mouth daily. Patient not taking: Reported on 04/10/2018 05/21/15   Richardson Dopp T, PA-C    Allergies:   Allergies  Allergen Reactions  . Sulfa Antibiotics Swelling  . Pork-Derived Products     muslim  . Aceon [Perindopril Erbumine] Cough    Social History:  reports that he has never smoked. He has never used smokeless tobacco. He reports that he does not drink alcohol or use drugs.  Family History: Family History  Problem Relation Age of Onset  . Healthy Father   . Ulcers Mother   . Stroke Mother     Physical Exam: Vitals:   04/09/18 2230 04/10/18 0025 04/10/18 0157 04/10/18 0245  BP:  (!) 153/60 (!) 168/67 (!) 151/133  Pulse:  63 62 60  Resp:  18 18 20   Temp:  98.2 F (36.8 C)    TempSrc:  Oral    SpO2:  95% 100% 96%  Weight: 57.6 kg (127 lb)     Height: 5' 6.5" (1.689 m)       General:  Alert  and oriented times three, well developed and nourished, no acute distress Eyes: PERRLA, pink conjunctiva, no scleral icterus ENT: Moist oral mucosa, neck supple, no thyromegaly Lungs: clear to ascultation, no wheeze, no crackles, no use of accessory muscles Cardiovascular: regular rate and rhythm, no regurgitation, no gallops, no murmurs. No carotid bruits, no JVD Abdomen: soft, positive BS, non-tender, non-distended, no organomegaly, not an acute abdomen GU: not examined Neuro: CN II - XII grossly intact, sensation intact Musculoskeletal: strength 5/5 all extremities, no clubbing, cyanosis or edema Skin: no rash, no subcutaneous crepitation, no decubitus Psych: appropriate patient   Labs on Admission:  Recent Labs    04/09/18 2233  NA 141  K 4.6  CL 103  CO2 28  GLUCOSE 152*  BUN 24*  CREATININE 0.89  CALCIUM 9.0   No results for input(s): AST, ALT, ALKPHOS, BILITOT, PROT, ALBUMIN in the last 72 hours. No results for input(s): LIPASE, AMYLASE in the last 72 hours. Recent Labs    04/09/18 2233  WBC 6.7  HGB 10.3*  HCT 34.5*  MCV 60.4*  PLT 197   No results for input(s): CKTOTAL, CKMB, CKMBINDEX, TROPONINI in the last 72 hours. Invalid input(s): POCBNP No results for input(s): DDIMER in the last 72 hours. No results for input(s): HGBA1C in the last 72 hours. No results for input(s): CHOL, HDL, LDLCALC, TRIG, CHOLHDL, LDLDIRECT in the last 72 hours. No results for input(s): TSH, T4TOTAL, T3FREE, THYROIDAB in the last 72 hours.  Invalid input(s): FREET3 No results for input(s): VITAMINB12, FOLATE, FERRITIN, TIBC, IRON, RETICCTPCT in the last 72 hours.  Micro Results: No results found for this or any previous visit (from the past 240 hour(s)).   Radiological Exams on Admission: Dg Chest 2 View  Result Date: 04/09/2018 CLINICAL DATA:  Left-sided chest pain, onset yesterday.  Nausea. EXAM: CHEST - 2 VIEW COMPARISON:  Radiograph 01/25/2017, CT 07/22/2015 FINDINGS: Post  median sternotomy and CABG. Mild cardiomegaly and tortuous atherosclerotic aorta, unchanged. Chronic blunting of the costophrenic angles without pleural effusion. No pulmonary edema. No focal airspace disease. No pneumothorax. Scoliotic curvature in degenerative change in the spine. IMPRESSION: 1. Post CABG with stable cardiomegaly and tortuous atherosclerotic thoracic aorta. Aortic Atherosclerosis (ICD10-I70.0). 2. No congestive failure or acute pulmonary process. Electronically Signed   By: Jeb Levering M.D.   On: 04/09/2018 23:19    [facility]  Order# [id number]  Reading physician: Troy Sine, MD Ordering physician: Troy Sine, MD Study date: 05/23/15  Physicians   Panel Physicians Referring Physician Case Authorizing Physician  Troy Sine, MD (Primary)    Procedures   Coronary Stent Intervention  Left Heart Cath and Cors/Grafts Angiography  Conclusion    Mid LAD lesion, 100% stenosed.  Ost Cx lesion, 100% stenosed.  LM lesion, 95% stenosed.  Prox RCA lesion, 50% stenosed.  Post Atrio lesion, 100% stenosed.  Dist RCA lesion, 50% stenosed.  SVG was injected is normal in caliber, and is anatomically normal.  SVG was injected is normal in caliber, and is anatomically normal.  was injected is normal in caliber.  There is severe disease in the graft.  Dist Graft to Insertion lesion, 99% stenosed. There is a 0% residual stenosis post intervention.  A drug-eluting stent was placed.  The left ventricular systolic function is normal.   Low normal global LV function with an ejection fraction of 50-55%.  Significant native CAD with 95% distal left main stenosis, proximal occlusion of the LAD after the first 2 septal perforating arteries; total occlusion of the circumflex at the ostium; 50% proximal RCA stenosis, 50% mid stenosis and total occlusion of the distal RCA after a small PDA-like vessel.  Patent LIMA graft supplying the  mid LAD.  SVG supplying the circumflex marginal vessel with 99% distal anastomosis stenosis.  Patent SVG supplying the diagonal vessel.  Patent SVG apply the distal RCA with evidence for collateralization of the AV groove circumflex from the PLA vessel.  Successful PCI to the SVG to obtuse marginal vessel, treated with PTCA, Synergy DES stenting with a 3.016 mm stent postdilated to 3.43 mm in the very distal portion of the graft to 3.23 mm in the native marginal vessel with the stenosis being reduced  to 0% and brisk 3 TIMI-3 flow.  RECOMMENDATION:  Continued DAPT therapy and medical therapy for concomitant native CAD.      Assessment/Plan Present on Admission: . Ischemic chest pain/CAD (coronary artery disease) of bypass graft -23-hour observation med telemetry -ACS order set initiated -Cardiology consult in a.m. -2D echo -Cycle cardiac enzymes, lipid panel in a.m.  . COPD, moderate ([place]) -Stable, home meds resume  . Essential hypertension -Stable, home meds resumed  . Hypercholesterolemia -Stable, home meds resumed  Dakota [name] 04/10/2018, 3:54 AM

## 2018-04-10 NOTE — Progress Notes (Signed)
Patient says he takes metamucil and a stool softener every day.

## 2018-04-10 NOTE — Progress Notes (Signed)
Patient does not eat pork.   

## 2018-04-10 NOTE — Progress Notes (Signed)
  Echocardiogram 2D Echocardiogram has been performed.  Dakota [name] 04/10/2018, 2:08 PM

## 2018-04-10 NOTE — Consult Note (Addendum)
Cardiology Consult    Patient ID: Dakota [name] MRN: [id number], DOB/AGE: Nov 21, 1925   Admit date: 04/09/2018 Date of Consult: 04/10/2018  Primary Physician: [name], MD Primary Cardiologist: Dr. [name]  Requesting Provider: Dr. Marthenia Rolling Reason for Consultation: Chest pain  Dakota [name] is a 84 y.o. male who is being seen today for the evaluation of chest pain at the request of Dr. Marthenia Rolling.   Patient Profile    84 yo male with PMH of CAD s/p CABG ('05), combined HF, LBBB, DM, GERD, HTN who presented with chest pain.   Past Medical History   Past Medical History:  Diagnosis Date  . Anginal pain ([place])   . Arthritis   . Coronary artery disease   . Diabetes mellitus   . GERD (gastroesophageal reflux disease)   . History of cardiovascular stress test    Lexiscan Myoview 7/16:  EF 51%, inferior, inferolateral ischemia; Intermediate Risk  . Hypertension   . Ischemic heart disease    a. s/p CABG;  b.  LHC 05/23/15:  S-RPAVB ok, S-D2 ok, S-OM2 99% (s/p Synergy DES), L-LAD ok, EF normal (complicated by pseudoaneurysm)   . Shortness of breath dyspnea   . Smallpox     Past Surgical History:  Procedure Laterality Date  . APPENDECTOMY    . [place]   normal  ([place])  . CARDIAC CATHETERIZATION N/A 05/23/2015   Procedure: Left Heart Cath and Cors/Grafts Angiography;  Surgeon: Troy Sine, MD;  Location: [facility];  Service: Cardiovascular;  Laterality: N/A;  . CARDIAC CATHETERIZATION N/A 05/23/2015   Procedure: Coronary Stent Intervention;  Surgeon: Troy Sine, MD;  Location: [facility];  Service: Cardiovascular;  Laterality: N/A;  . CORONARY ARTERY BYPASS GRAFT  12/2003  . DOPPLER ECHOCARDIOGRAPHY  07/15/10   EF 40-45%, aortic stenosis  . FOOT SURGERY Right   . HERNIA REPAIR Bilateral 1979  . PERCUTANEOUS CORONARY STENT INTERVENTION (PCI-S)  05/23/2015   des    ptca svg     Allergies  Allergies  Allergen Reactions  . Sulfa  Antibiotics Swelling  . Pork-Derived Products     muslim  . Aceon [Perindopril Erbumine] Cough    History of Present Illness    Dakota [name] is a 84 yo male with PMH of CAD s/p CABG ('05), combined HF, LBBB, DM, GERD, HTN.  He is followed by Dr. [name] as an outpatient.  Underwent CABG in 2005 with a LIMA to the LAD, SVG to diagonal, SVG to OM and SVG to distal RCA.  Echo in 2013 showed normal EF with moderate diastolic dysfunction.  In July 2016 he developed unstable angina, and stress test was abnormal demonstrating inferior to inferior lateral ischemia.  Underwent cardiac catheterization with patent LIMA to the LAD, SVG to RCA and SVG to diagonal, with new stenosis in the vein graft supplying the circumflex marginal which underwent PCI and DES x1.  Of note this procedure was complicated by a small pseudoaneurysm which eventually self thrombosed and it did not require a thrombin injection.  Underwent repeat echo 1/18 which showed normal EF with mild LVH no wall motion abnormalities with grade 2 diastolic dysfunction.  He was last seen in the office on 12/06/2017 and reported being overall in his usual state of health.  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  Has noticed some increased weakness over the past couple of months, and has required using  his cane more often.  Did have a fall back in December, but denied any presyncope or syncope with this episode.  He has maintained good control over his lipids per Dr. Evette Georges notes from the office.  His blood pressure was noted to be elevated at this visit and his losartan was increased to 50 mg in the morning and 25 mg in the evening.  He is continued on his daily Lasix 20 mg.  He has been maintained on dual antiplatelet therapy with aspirin and Plavix without any bleeding.  In talking with the patient he reports ongoing sharp shooting left-sided chest discomfort over quite some time now.  States that he tends to have these episodes  at least once a day, but are not associated with rest or activity.  Says it feels like "something sticking him underneath his rib cage", no associated nausea, vomiting palpitations or diaphoresis.  He does report shortness of breath, but states this is not unusual for him.  He has been chronically short of breath for some time now and has been using an inhaler as needed which has been increased to daily.  He denies any increase in weight, lower extremity edema or abdominal distention.  Last evening around 930 he developed more of this sharp shooting chest pain and informed his family who called the on-call provider and was advised to come in for further evaluation.  In the ED his labs showed stable electrolytes, hemoglobin 10.3, POC troponin negative with follow-up delta troponin negative.  Chest x-ray is negative for edema.  EKG showed sinus rhythm with first-degree AV block, and known left bundle branch block.  He was not given any medication on admission but informed that he did not have any discomfort at that time.   In talking with him he has had some brief episodes of recurrence of his chest discomfort since he was admitted last evening.  These seem to be very brief, and fleeting in nature.  Inpatient Medications    . aspirin EC  81 mg Oral Daily  . clopidogrel  75 mg Oral Daily  . docusate sodium  100 mg Oral QHS  . finasteride  5 mg Oral QHS  . midazolam  2 mg Intravenous Once  . mirabegron ER  50 mg Oral Daily  . pantoprazole  40 mg Oral Daily  . pravastatin  80 mg Oral QHS    Family History    Family History  Problem Relation Age of Onset  . Healthy Father   . Ulcers Mother   . Stroke Mother     Social History    Social History   Socioeconomic History  . Marital status: Married    Spouse name: Not on file  . Number of children: 2 d  . Years of education: Not on file  . Highest education level: Not on file  Occupational History  . Occupation: Optometrist    Comment:  [employer]  Social Needs  . Financial resource strain: Not on file  . Food insecurity:    Worry: Not on file    Inability: Not on file  . Transportation needs:    Medical: Not on file    Non-medical: Not on file  Tobacco Use  . Smoking status: Never Smoker  . Smokeless tobacco: Never Used  Substance and Sexual Activity  . Alcohol use: No    Alcohol/week: 0.0 oz  . Drug use: No  . Sexual activity: Not on file  Lifestyle  . Physical activity:  Days per week: Not on file    Minutes per session: Not on file  . Stress: Not on file  Relationships  . Social connections:    Talks on phone: Not on file    Gets together: Not on file    Attends religious service: Not on file    Active member of club or organization: Not on file    Attends meetings of clubs or organizations: Not on file    Relationship status: Not on file  . Intimate partner violence:    Fear of current or ex partner: Not on file    Emotionally abused: Not on file    Physically abused: Not on file    Forced sexual activity: Not on file  Other Topics Concern  . Not on file  Social History Narrative  . Not on file     Review of Systems    See HPI  All other systems reviewed and are otherwise negative except as noted above.  Physical Exam    Blood pressure (!) 169/72, pulse 60, temperature 97.6 F (36.4 C), temperature source Oral, resp. rate 20, height 5\' 7"  (1.702 m), weight 118 lb 8 oz (53.8 kg), SpO2 97 %.  General: Pleasant, older, frail male, NAD Psych: Normal affect. Neuro: Alert and oriented X 3. Moves all extremities spontaneously. HEENT: Normal  Neck: Supple, no JVD. Lungs:  Resp regular and unlabored, CTA. Heart: RRR no s3, s4, soft systolic murmur. Abdomen: Soft, non-tender, non-distended, BS + x 4.  Extremities: No clubbing, cyanosis or edema. DP/PT/Radials 2+ and equal bilaterally.  Labs    Troponin [facility] Test) Recent Labs    04/09/18 2253  TROPIPOC 0.00   Recent Labs     04/10/18 0552  TROPONINI <0.03   Lab Results  Component Value Date   WBC 5.7 04/10/2018   HGB 9.6 (L) 04/10/2018   HCT 31.9 (L) 04/10/2018   MCV 61.0 (L) 04/10/2018   PLT 181 04/10/2018    Recent Labs  Lab 04/09/18 2233 04/10/18 0552  NA 141  --   K 4.6  --   CL 103  --   CO2 28  --   BUN 24*  --   CREATININE 0.89 0.72  CALCIUM 9.0  --   GLUCOSE 152*  --    Lab Results  Component Value Date   CHOL 114 07/20/2017   HDL 51 07/20/2017   LDLCALC 45 07/20/2017   TRIG 93 07/20/2017   No results found for: [facility]   Radiology Studies    Dg Chest 2 View  Result Date: 04/09/2018 CLINICAL DATA:  Left-sided chest pain, onset yesterday.  Nausea. EXAM: CHEST - 2 VIEW COMPARISON:  Radiograph 01/25/2017, CT 07/22/2015 FINDINGS: Post median sternotomy and CABG. Mild cardiomegaly and tortuous atherosclerotic aorta, unchanged. Chronic blunting of the costophrenic angles without pleural effusion. No pulmonary edema. No focal airspace disease. No pneumothorax. Scoliotic curvature in degenerative change in the spine. IMPRESSION: 1. Post CABG with stable cardiomegaly and tortuous atherosclerotic thoracic aorta. Aortic Atherosclerosis (ICD10-I70.0). 2. No congestive failure or acute pulmonary process. Electronically Signed   By: Jeb Levering M.D.   On: 04/09/2018 23:19    ECG & Cardiac Imaging    EKG:  The EKG was personally reviewed and demonstrates SR with 1st degree AVB, chronic LBBB.  Echo: 1/18  Study Conclusions  - Left ventricle: The cavity size was normal. Wall thickness was   increased in a pattern of mild LVH. Systolic function was normal.  The estimated ejection fraction was in the range of 50% to 55%.   Wall motion was normal; there were no regional wall motion   abnormalities. Features are consistent with a pseudonormal left   ventricular filling pattern, with concomitant abnormal relaxation   and increased filling pressure (grade 2 diastolic dysfunction). - Aortic  valve: Moderately calcified annulus. Mildly thickened,   mildly calcified leaflets. - Left atrium: The atrium was moderately dilated. - Right atrium: The atrium was mildly dilated. - Pulmonary arteries: Systolic pressure was mildly increased. PA   peak pressure: 36 mm Hg (S).  Assessment & Plan    84 yo male with PMH of CAD s/p CABG ('05), combined HF, LBBB, DM, GERD, HTN who presented with chest pain.   1. Atypical chest pain: He has been having these episodes for quite a while now. They seem to come at random times, brief in nature. No associated symptoms. States this is very different from what he experienced with his PCI/DES back in 2016, as this chest pain is not as intense and brief in nature. Thus far trops have neg x2. EKG shows known LBBB. He does have native vessel disease with patent grafts on last cath. Seems reasonable to start with more conservative approach.  -- echo is pending, if abnormal will need to pursue further work up.  -- will add Imdur 30mg  daily for now  2. Hx of combined HF: Last echo showed low normal EF of 50-55% with G2DD and no WMA. No signs/symptoms of HF noted at this time. CXR was neg. Would continue with home dose of diuretic.   3. SB: with Hx of the same. No pauses noted on telemetry. No room for BB therapy at this time.   4. HL: Lipids have been well controlled. Remains on pravastatin.   5. CAD s/p CABG with subsq PCI: remains on ASA and plavix.   Barnet Pall, NP-C Pager [phone] 04/10/2018, 9:42 AM   History and all data above reviewed.  Patient examined.  I agree with the findings as above.  The patient has chest pain that has been ongoing for some time.  However, it seems to be more frequent recently and he took NTG yesterday.  His daughter insisted that he call our service last night.  He has had sharp pain that is atypical and under the left breast.  It occurs at rest.  It is fleeing and lasts for a few seconds.   He does not think  that the NTG that he took makes any difference.  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  Enzymes are negative.  EKG not changed with chronic LBBB.    The patient exam reveals COR:RRR  ,  Lungs: Clear  ,  Abd: Positive bowel sounds, no rebound no guarding, Ext No edema  .  All available labs, radiology testing, previous records reviewed. Agree with documented assessment and plan. Chest pain.  Atypical.  Agree with Imdur.  Check echo.  No invasive work up if echo OK and unchanged from previous.  Can follow with Dr. [name] as an outpatient.    Jeneen Rinks Deidre Carino  12:22 PM  04/10/2018

## 2018-04-11 DIAGNOSIS — I10 Essential (primary) hypertension: Secondary | ICD-10-CM | POA: Diagnosis not present

## 2018-04-11 DIAGNOSIS — R079 Chest pain, unspecified: Secondary | ICD-10-CM | POA: Diagnosis not present

## 2018-04-11 MED ORDER — POLYETHYLENE GLYCOL 3350 17 G PO PACK
17.0000 g | PACK | Freq: Once | ORAL | Status: AC
  Administered 2018-04-11: 17 g via ORAL
  Filled 2018-04-11: qty 1

## 2018-04-11 MED ORDER — ISOSORBIDE MONONITRATE ER 30 MG PO TB24: 30.0000 mg | ORAL_TABLET | Freq: Every day | ORAL | 0 refills | Status: DC

## 2018-04-11 MED ORDER — FUROSEMIDE 40 MG PO TABS: 20.0000 mg | ORAL_TABLET | ORAL | 3 refills | Status: DC

## 2018-04-11 NOTE — Discharge Summary (Signed)
Physician Discharge Summary  Dakota [name] ULA:[id number] DOB: Nov 25, 1925 DOA: 04/09/2018  PCP: [name], MD  Admit date: 04/09/2018 Discharge date: 04/11/2018  Admitted From: home Discharge disposition: home   Recommendations for Outpatient Follow-Up:   1. imdur added-- follow with Dr. [name]   Discharge Diagnosis:   Active Problems:   Hx of CABG   Diabetes ([place])   Hypercholesterolemia   Ischemic chest pain   CAD (coronary artery disease) of bypass graft   COPD, moderate ([place])   Essential hypertension   Chest pain    Discharge Condition: Improved.  Diet recommendation: Low sodium, heart healthy.  Carbohydrate-modified.  Wound care: None.  Code status: Full.   History of Present Illness:   84 y/o male with h/o CAD, s/p CABG who presents with c/o left sided chest pain since yesterday.later that day he was still having chest pain. He used nitro which did not help. He describes his pain sharp. His pain was intermittent. Movement did not affect pain. At its worse his pain was 5/10, currently it is 0/10. He reports SOB associated with the chest pain but he is chronically SOB and easily fatigued. She denies diaphoresis. He reports heart palpitation. The patient has a h/o CAD, bypass 2005, stents placed after 2015. He normally does not have chest pains. Patients last LHC was in 2016.     Hospital Course by Problem:   Chest pain -per cards: . Chest pain.  Atypical.  Agree with Imdur.  Check echo.  No invasive work up if echo OK and unchanged from previous.  Can follow with Dr. [name] as an outpatient.  Echo done -- essentially stable compared to previous studies.   . COPD, moderate ([place]) -Stable, home meds resume  . Essential hypertension -Stable, home meds resumed  . Hypercholesterolemia -Stable, home meds resumed       Medical Consultants:    cards   Discharge Exam:   Vitals:   04/10/18 2255 04/11/18 0458  BP: 118/60 (!) 135/52    Pulse: (!) 54 (!) 57  Resp: 18 20  Temp: (!) 97.5 F (36.4 C) 97.6 F (36.4 C)  SpO2: 95% 94%   Vitals:   04/10/18 2039 04/10/18 2255 04/11/18 0451 04/11/18 0458  BP: (!) 127/58 118/60  (!) 135/52  Pulse: (!) 54 (!) 54  (!) 57  Resp: 20 18  20   Temp: 97.7 F (36.5 C) (!) 97.5 F (36.4 C)  97.6 F (36.4 C)  TempSrc: Oral Oral  Oral  SpO2: 98% 95%  94%  Weight:   53.9 kg (118 lb 14.4 oz)   Height:        General exam: Appears calm and comfortable. Up walking in room    The results of significant diagnostics from this hospitalization (including imaging, microbiology, ancillary and laboratory) are listed below for reference.     Procedures and Diagnostic Studies:   Dg Chest 2 View  Result Date: 04/09/2018 CLINICAL DATA:  Left-sided chest pain, onset yesterday.  Nausea. EXAM: CHEST - 2 VIEW COMPARISON:  Radiograph 01/25/2017, CT 07/22/2015 FINDINGS: Post median sternotomy and CABG. Mild cardiomegaly and tortuous atherosclerotic aorta, unchanged. Chronic blunting of the costophrenic angles without pleural effusion. No pulmonary edema. No focal airspace disease. No pneumothorax. Scoliotic curvature in degenerative change in the spine. IMPRESSION: 1. Post CABG with stable cardiomegaly and tortuous atherosclerotic thoracic aorta. Aortic Atherosclerosis (ICD10-I70.0). 2. No congestive failure or acute pulmonary process. Electronically Signed   By: Fonnie Birkenhead.D.  On: 04/09/2018 23:19     Labs:   Basic Metabolic Panel: Recent Labs  Lab 04/09/18 2233 04/10/18 0552  NA 141  --   K 4.6  --   CL 103  --   CO2 28  --   GLUCOSE 152*  --   BUN 24*  --   CREATININE 0.89 0.72  CALCIUM 9.0  --    GFR Estimated Creatinine Clearance: 46.8 mL/min (by C-G formula based on SCr of 0.72 mg/dL). Liver Function Tests: No results for input(s): AST, ALT, ALKPHOS, BILITOT, PROT, ALBUMIN in the last 168 hours. No results for input(s): LIPASE, AMYLASE in the last 168 hours. No results  for input(s): AMMONIA in the last 168 hours. Coagulation profile No results for input(s): INR, PROTIME in the last 168 hours.  CBC: Recent Labs  Lab 04/09/18 2233 04/10/18 0552  WBC 6.7 5.7  HGB 10.3* 9.6*  HCT 34.5* 31.9*  MCV 60.4* 61.0*  PLT 197 181   Cardiac Enzymes: Recent Labs  Lab 04/10/18 0552 04/10/18 1100 04/10/18 1200  TROPONINI <0.03 <0.03 <0.03   BNP: Invalid input(s): POCBNP CBG: Recent Labs  Lab 04/10/18 0628  GLUCAP 128*   D-Dimer No results for input(s): DDIMER in the last 72 hours. Hgb A1c No results for input(s): HGBA1C in the last 72 hours. Lipid Profile No results for input(s): CHOL, HDL, LDLCALC, TRIG, CHOLHDL, LDLDIRECT in the last 72 hours. Thyroid function studies No results for input(s): TSH, T4TOTAL, T3FREE, THYROIDAB in the last 72 hours.  Invalid input(s): FREET3 Anemia work up No results for input(s): VITAMINB12, FOLATE, FERRITIN, TIBC, IRON, RETICCTPCT in the last 72 hours. Microbiology No results found for this or any previous visit (from the past 240 hour(s)).   Discharge Instructions:   Discharge Instructions    Diet - low sodium heart healthy   Complete by:  As directed    Increase activity slowly   Complete by:  As directed      Allergies as of 04/11/2018      Reactions   Sulfa Antibiotics Swelling   Pork-derived Products    muslim   Aceon [perindopril Erbumine] Cough      Medication List    STOP taking these medications   azithromycin 250 MG tablet Commonly known as:  ZITHROMAX   HYDROcodone-homatropine 5-1.5 MG/5ML syrup Commonly known as:  HYDROMET   losartan 50 MG tablet Commonly known as:  COZAAR     TAKE these medications   albuterol 108 (90 Base) MCG/ACT inhaler Commonly known as:  PROVENTIL HFA;VENTOLIN HFA Inhale 2 puffs into the lungs every 4 (four) hours as needed for wheezing or shortness of breath.   aspirin EC 81 MG tablet Take 81 mg by mouth daily.   clopidogrel 75 MG  tablet Commonly known as:  PLAVIX Take 1 tablet (75 mg total) by mouth daily. NEED OV.   docusate sodium 100 MG capsule Commonly known as:  COLACE Take 100 mg by mouth at bedtime.   finasteride 5 MG tablet Commonly known as:  PROSCAR Take 5 mg by mouth at bedtime.   furosemide 40 MG tablet Commonly known as:  LASIX Take 0.5 tablets (20 mg total) by mouth every other day. Per Dr. Evette Georges last note, patient to take every other day What changed:    when to take this  reasons to take this  additional instructions   isosorbide mononitrate 30 MG 24 hr tablet Commonly known as:  IMDUR Take 1 tablet (30 mg total) by mouth  daily.   multivitamin with minerals Tabs tablet Take 1 tablet by mouth daily.   MYRBETRIQ 50 MG Tb24 tablet Generic drug:  mirabegron ER Take 50 mg by mouth daily.   NITROSTAT 0.4 MG SL tablet Generic drug:  nitroGLYCERIN Place 1 tablet (0.4 mg total) under the tongue every 5 (five) minutes as needed (for chest pain).   ONE TOUCH ULTRA TEST test strip Generic drug:  glucose blood 1 each by Other route as needed (glucose monoring).   pantoprazole 40 MG tablet Commonly known as:  PROTONIX Take 1 tablet (40 mg total) by mouth daily.   pravastatin 80 MG tablet Commonly known as:  PRAVACHOL Take 80 mg by mouth at bedtime.   PRESERVISION AREDS 2 PO Take 1 tablet by mouth 2 (two) times daily.   terbinafine 250 MG tablet Commonly known as:  LAMISIL Take 250 mg by mouth daily.      Follow-up Information    Tisovec, Fransico Him, MD In 1 week.   Specialty:  Internal Medicine Why:  Left message for office to call Contact information: [place] [address] [phone]        Lendon Colonel, NP Follow up on 04/21/2018.   Specialties:  Nurse Practitioner, Radiology, Cardiology Why:  Please arrive 15 minutes early for your 9:30am appointment Contact information: [address] [place] [address] [phone]             Time coordinating discharge: 35 ,min  Signed:  Geradine Girt  [facility] 04/13/2018, 3:02 PM

## 2018-04-11 NOTE — Progress Notes (Addendum)
     Echo done -- essentially stable compared to previous studies.   Per initial c/s note - would not recommend in-pt ischemic evaluation   If ok ambulating in hallway - would be OK to d/c home for OP evaluation.    [facility] will sign off.   Medication Recommendations:  Agree with Imdur Other recommendations (labs, testing, etc):  No further in-patient evaluation Follow up as an outpatient:  Will arrange OP f/u with Dr. [name].   Glenetta Hew, MD

## 2018-04-11 NOTE — Progress Notes (Signed)
Paged MD Eliseo Squires regarding patient and family request for medication for constipation Neta Mends RN 12:29 PM 12:30 PM 04-11-2018

## 2018-04-20 NOTE — Progress Notes (Signed)
Cardiology Office Note   Date:  04/20/2018   ID:  Dakota [name], DOB 26-May-1926, MRN [id number]  PCP:  [name], MD  Cardiologist: Peter Congo chief complaint on file.    History of Present Illness: Dakota [name] is a 84 y.o. male who presents for posthospitalization follow-up after admission for chest pain with known history of coronary artery disease, chronic left bundle branch block, coronary artery bypass grafting (LIMA to LAD, SVG to diagonal, SVG to OM, SVG to distal RCA in 2005).  This was associated with dyspnea, however the patient is chronically short of breath.  Other history includes COPD, diabetes, combined CHF, hypertension, and hypercholesterolemia  Most recent cardiac catheterization revealed patent LIMA to LAD, SVG to RCA, and SVG to diagonal with new stenosis in the vein graft supplying the circumflex, which underwent PCI and drug-eluting stent x1-July 2016.  He was seen by cardiology during recent hospitalization and was diagnosed with atypical chest pain.  Troponins were found to be negative, EKG revealed chronic left bundle branch block.  Imdur 30 mg was added for assistance with anginal pain.  He is confused today about his medication regimen. His daughter prepares his medications and has not stopped the losartan as directed nor has she started the isosorbide. They were uncertain what the new regimen would be on discharge. He has had some recurrent chest pain since being home.   Past Medical History:  Diagnosis Date  . Anginal pain ([place])   . Arthritis   . Coronary artery disease   . Diabetes mellitus   . GERD (gastroesophageal reflux disease)   . History of cardiovascular stress test    Lexiscan Myoview 7/16:  EF 51%, inferior, inferolateral ischemia; Intermediate Risk  . Hypertension   . Ischemic heart disease    a. s/p CABG;  b.  LHC 05/23/15:  S-RPAVB ok, S-D2 ok, S-OM2 99% (s/p Synergy DES), L-LAD ok, EF normal (complicated by pseudoaneurysm)   . Shortness of  breath dyspnea   . Smallpox     Past Surgical History:  Procedure Laterality Date  . APPENDECTOMY    . [place]   normal  ([place])  . CARDIAC CATHETERIZATION N/A 05/23/2015   Procedure: Left Heart Cath and Cors/Grafts Angiography;  Surgeon: Troy Sine, MD;  Location: [facility];  Service: Cardiovascular;  Laterality: N/A;  . CARDIAC CATHETERIZATION N/A 05/23/2015   Procedure: Coronary Stent Intervention;  Surgeon: Troy Sine, MD;  Location: [facility];  Service: Cardiovascular;  Laterality: N/A;  . CORONARY ARTERY BYPASS GRAFT  12/2003  . DOPPLER ECHOCARDIOGRAPHY  07/15/10   EF 40-45%, aortic stenosis  . FOOT SURGERY Right   . HERNIA REPAIR Bilateral 1979  . PERCUTANEOUS CORONARY STENT INTERVENTION (PCI-S)  05/23/2015   des    ptca svg     Current Outpatient Medications  Medication Sig Dispense Refill  . albuterol (PROVENTIL HFA;VENTOLIN HFA) 108 (90 Base) MCG/ACT inhaler Inhale 2 puffs into the lungs every 4 (four) hours as needed for wheezing or shortness of breath. (Patient not taking: Reported on 04/10/2018) 1 Inhaler 0  . aspirin EC 81 MG tablet Take 81 mg by mouth daily.    . clopidogrel (PLAVIX) 75 MG tablet Take 1 tablet (75 mg total) by mouth daily. NEED OV. 90 tablet 1  . docusate sodium (COLACE) 100 MG capsule Take 100 mg by mouth at bedtime.    . finasteride (PROSCAR) 5 MG tablet Take 5 mg by  mouth at bedtime.    . furosemide (LASIX) 40 MG tablet Take 0.5 tablets (20 mg total) by mouth every other day. Per Dr. Evette Georges last note, patient to take every other day 90 tablet 3  . isosorbide mononitrate (IMDUR) 30 MG 24 hr tablet Take 1 tablet (30 mg total) by mouth daily. 30 tablet 0  . mirabegron ER (MYRBETRIQ) 50 MG TB24 tablet Take 50 mg by mouth daily.    . Multiple Vitamin (MULTIVITAMIN WITH MINERALS) TABS tablet Take 1 tablet by mouth daily.    . Multiple Vitamins-Minerals (PRESERVISION AREDS 2 PO) Take 1 tablet by mouth 2 (two)  times daily.     [name] NITROSTAT 0.4 MG SL tablet Place 1 tablet (0.4 mg total) under the tongue every 5 (five) minutes as needed (for chest pain). 25 tablet 1  . ONE TOUCH ULTRA TEST test strip 1 each by Other route as needed (glucose monoring).     . pantoprazole (PROTONIX) 40 MG tablet Take 1 tablet (40 mg total) by mouth daily. (Patient not taking: Reported on 04/10/2018) 90 tablet 3  . pravastatin (PRAVACHOL) 80 MG tablet Take 80 mg by mouth at bedtime.     . terbinafine (LAMISIL) 250 MG tablet Take 250 mg by mouth daily.     Current Facility-Administered Medications  Medication Dose Route Frequency Provider Last Rate Last Dose  . SOLUTION-PLUS STOPCOCK MISC   Does not apply Once Lorretta Harp, MD        Allergies:   Sulfa antibiotics; Pork-derived products; and Aceon [perindopril erbumine]    Social History:  The patient  reports that he has never smoked. He has never used smokeless tobacco. He reports that he does not drink alcohol or use drugs.   Family History:  The patient's family history includes Healthy in his father; Stroke in his mother; Ulcers in his mother.    ROS: All other systems are reviewed and negative. Unless otherwise mentioned in H&P    PHYSICAL EXAM: VS:  There were no vitals taken for this visit. , BMI There is no height or weight on file to calculate BMI. GEN: Well nourished, well developed, in no acute distress  HEENT: normal  Neck: no JVD, carotid bruits, or masses Cardiac: RRR 2/6 holosystolic murmur, rubs, or gallops,no edema  Respiratory: Clear to auscultation bilaterally, normal work of breathing GI: soft, nontender, nondistended, + BS MS: no deformity or atrophy  Skin: warm and dry, no rash Neuro:  Strength and sensation are intact Psych: euthymic mood, full affect   EKG:  Not completed this office visit.   Recent Labs: 07/20/2017: ALT 13; TSH 1.41 04/09/2018: BUN 24; Potassium 4.6; Sodium 141 04/10/2018: Creatinine, Ser 0.72; Hemoglobin 9.6;  Platelets 181    Lipid Panel    Component Value Date/Time   CHOL 114 07/20/2017 0949   TRIG 93 07/20/2017 0949   HDL 51 07/20/2017 0949   CHOLHDL 2.2 07/20/2017 0949   VLDL 10 09/07/2016 1027   LDLCALC 45 07/20/2017 0949      Wt Readings from Last 3 Encounters:  04/11/18 118 lb 14.4 oz (53.9 kg)  12/06/17 127 lb 3.2 oz (57.7 kg)  08/03/17 127 lb 6.4 oz (57.8 kg)   Other studies Reviewed:  Echocardiogram 04/10/2018 Left ventricle: The cavity size was normal. Systolic function was   normal. The estimated ejection fraction was in the range of 60%   to 65%. Wall motion was normal; there were no regional wall   motion abnormalities. Doppler parameters  are consistent with   abnormal left ventricular relaxation (grade 1 diastolic   dysfunction). Doppler parameters are consistent with high   ventricular filling pressure. - Aortic valve: Valve mobility was restricted. Transvalvular   velocity was within the normal range. There was no stenosis.   There was mild regurgitation. Valve area (VTI): 2.22 cm^2. Valve   area (Vmax): 2.71 cm^2. Valve area (Vmean): 2.43 cm^2. - Mitral valve: Calcified annulus. Transvalvular velocity was   within the normal range. There was no evidence for stenosis.   There was trivial regurgitation. Valve area by continuity   equation (using LVOT flow): 2.45 cm^2. - Left atrium: The atrium was severely dilated. - Right ventricle: The cavity size was normal. Wall thickness was   normal. Systolic function was normal. - Right atrium: The atrium was mildly dilated. - Tricuspid valve: There was no regurgitation   - Visually there appears to be moderate aortic stenosis. However,   elevated aortic valve gradients were not obtained.  ASSESSMENT AND PLAN:  1. CAD:  Known history of , coronary artery bypass grafting (LIMA to LAD, SVG to diagonal, SVG to OM, SVG to distal RCA in 2005), along with stenting of the PCI and drug-eluting stent x1-July 2016. He continues  to have angina pain but has not yet started the isosorbide regimen. I have gone over his medication with he and his daughter. They now understand to stop losartan and to begin the nitrates, He will follow up with Dr, [name] in 3-4 months. Continue DAPT.   He would like to continue walking regimen. I have encouraged him to do so.   2. Hypertension: Continues to be controlled. Medication adjustments as directed.   3. Chronic LEE: He is to continue lasix QOD as directed. He wants to gain weight and become more active. I have explained to him that fluid weight gain comes on within 1-2 days. Improved nutrition weight will allow weight gain of 2-3 lbs a week.   4. Hypercholesterolemia;  Continue statin therapy.   Current medicines are reviewed at length with the patient today.    Labs/ tests ordered today include: None  Phill Myron. West Pugh, ANP, AACC   04/20/2018 4:52 PM    [facility] [address], [place], [place] [address] Phone: [phone]; Fax: [phone]

## 2018-04-21 ENCOUNTER — Other Ambulatory Visit: Payer: Self-pay | Admitting: Cardiovascular Disease

## 2018-04-21 ENCOUNTER — Encounter: Payer: Self-pay | Admitting: Adult Health

## 2018-04-21 ENCOUNTER — Ambulatory Visit (INDEPENDENT_AMBULATORY_CARE_PROVIDER_SITE_OTHER): Payer: BLUE CROSS/BLUE SHIELD | Admitting: Adult Health

## 2018-04-21 VITALS — BP 129/63 | HR 69 | Ht 67.0 in | Wt 121.6 lb

## 2018-04-21 DIAGNOSIS — E7800 Pure hypercholesterolemia, unspecified: Secondary | ICD-10-CM

## 2018-04-21 DIAGNOSIS — I10 Essential (primary) hypertension: Secondary | ICD-10-CM

## 2018-04-21 DIAGNOSIS — R0789 Other chest pain: Secondary | ICD-10-CM | POA: Diagnosis not present

## 2018-04-21 DIAGNOSIS — I2510 Atherosclerotic heart disease of native coronary artery without angina pectoris: Secondary | ICD-10-CM | POA: Diagnosis not present

## 2018-04-21 NOTE — Patient Instructions (Signed)
Medication Instructions:  STOP LOSARTAN  MAKE SURE YOU TAKE THE ISOSORBIDE AT THE SAME TIME EVERY DAY  If you need a refill on your cardiac medications before your next appointment, please call your pharmacy.  Follow-Up: Your physician wants you to follow-up in: 3-4 MONTHS WITH DR [name] ONLY    Thank you for choosing [facility] at [facility]!!

## 2018-05-30 ENCOUNTER — Encounter: Payer: Self-pay | Admitting: Adult Health

## 2018-05-31 ENCOUNTER — Other Ambulatory Visit: Payer: Self-pay

## 2018-05-31 ENCOUNTER — Other Ambulatory Visit: Payer: Self-pay | Admitting: Cardiovascular Disease

## 2018-05-31 MED ORDER — ISOSORBIDE MONONITRATE ER 30 MG PO TB24: 30.0000 mg | ORAL_TABLET | Freq: Every day | ORAL | 1 refills | Status: DC

## 2018-05-31 NOTE — Telephone Encounter (Signed)
Pt is requesting refill for Isosorbide. It was on his med list at last ov with Jory Sims, NP, but is no longer listed. Pt sent MyChart message yesterday asking for refill if he is to continue taking.   Routing to Jory Sims, NP to advise.

## 2018-05-31 NOTE — Telephone Encounter (Signed)
I signed this this refill today

## 2018-05-31 NOTE — Telephone Encounter (Signed)
New Message:       *STAT* If patient is at the pharmacy, call can be transferred to refill team.   1. Which medications need to be refilled? (please list name of each medication and dose if known) isosorb mono  2. Which pharmacy/location (including street and city if local pharmacy) is medication to be sent to?[place], [place] - [address]  3. Do they need a 30 day or 90 day supply? [place]

## 2018-08-01 ENCOUNTER — Ambulatory Visit (INDEPENDENT_AMBULATORY_CARE_PROVIDER_SITE_OTHER): Payer: BLUE CROSS/BLUE SHIELD | Admitting: Cardiovascular Disease

## 2018-08-01 ENCOUNTER — Encounter: Payer: Self-pay | Admitting: Cardiovascular Disease

## 2018-08-01 VITALS — BP 153/74 | HR 54 | Ht 67.0 in | Wt 123.0 lb

## 2018-08-01 DIAGNOSIS — I25119 Atherosclerotic heart disease of native coronary artery with unspecified angina pectoris: Secondary | ICD-10-CM | POA: Diagnosis not present

## 2018-08-01 DIAGNOSIS — I519 Heart disease, unspecified: Secondary | ICD-10-CM

## 2018-08-01 DIAGNOSIS — I5189 Other ill-defined heart diseases: Secondary | ICD-10-CM

## 2018-08-01 DIAGNOSIS — Z951 Presence of aortocoronary bypass graft: Secondary | ICD-10-CM | POA: Diagnosis not present

## 2018-08-01 DIAGNOSIS — I10 Essential (primary) hypertension: Secondary | ICD-10-CM | POA: Diagnosis not present

## 2018-08-01 DIAGNOSIS — I5042 Chronic combined systolic (congestive) and diastolic (congestive) heart failure: Secondary | ICD-10-CM

## 2018-08-01 DIAGNOSIS — K219 Gastro-esophageal reflux disease without esophagitis: Secondary | ICD-10-CM

## 2018-08-01 MED ORDER — FUROSEMIDE 20 MG PO TABS: 20.0000 mg | ORAL_TABLET | ORAL | 3 refills | Status: DC

## 2018-08-01 MED ORDER — AMLODIPINE BESYLATE 2.5 MG PO TABS: 2.5000 mg | ORAL_TABLET | Freq: Every day | ORAL | 3 refills | Status: DC

## 2018-08-01 NOTE — Patient Instructions (Signed)
Medication Instructions:  START amlodipine 2.5 mg daily in the morning  If you need a refill on your cardiac medications before your next appointment, please call your pharmacy.   Follow-Up: At [facility], you and your health needs are our priority.  As part of our continuing mission to provide you with exceptional heart care, we have created designated Provider Care Teams.  These Care Teams include your primary Cardiologist (physician) and Advanced Practice Providers (APPs -  Physician Assistants and Nurse Practitioners) who all work together to provide you with the care you need, when you need it. You will need a follow up appointment in 4 months.  Please call our office 2 months in advance to schedule this appointment.  You may see [name], MD or one of the following Advanced Practice Providers on your designated Care Team: [place], [place] . Fabian Sharp, PA-C

## 2018-08-01 NOTE — Progress Notes (Signed)
Patient ID: Dakota [name], male   DOB: [birth year], 84 y.o.   MRN: [id number]    Primary M.D.: Dr. Delfino Lovett Tisovic  HPI: Dakota [name] is a 84 y.o. male is a former patient of Dr. Mare Ferrari.  He presents for a 7 month follow-up evaluation.  [name] has CAD and underwent CABG revascularization surgery in 2005 with a LIMA to the LAD, SVG to the diagonal, SVG to the obtuse marginal, and SVG to his distal RCA.  He has been demonstrated to have combined systolic and diastolic heart failure, left bundle branch block and diabetes mellitus.  In 2013.  An echo Doppler study showed an EF of 50-55% with moderate diastolic dysfunction.  In July 2016 he developed symptoms worrisome for unstable angina.  A nuclear perfusion study was abnormal and demonstrated inferior to inferolateral ischemia.  He underwent cardiac catheterization by me on 05/21/2015.  This revealed low normal global LV function with an EF of 50-55%.  There was significant native CAD with 95% distal left main stenosis, proximal occlusion of the LAD after the first 2 septal perforating arteries, total occlusion of the circumflex at the ostium, and RCA stenoses, 50% proximally, 50% mid, and total occlusion of the distal RCA after a small PDA-like vessel.  He had a patent LIMA graft supplying the mid LAD.  The vein graft supplying the circumflex marginal had a 99% distal anastomosis stenosis.  He had a patent vein graft supplying the diagonal vessel and a pain vein graft supplying the distal RCA with some collateralization of the AV groove circumflex from the PLA vessel. I performed successful intervention to the vein graft supplying the obtuse marginal vessel and a 3.016 mm Synergy DES stent postdilated to 3.43 mm was inserted with resumption of brisk TIMI-3 flow and 0 residual stenosis.  This procedure was complicated by a small pseudoaneurysm which essentially self thrombosed and he did not require any thrombin injection.  He last saw Dr. Mare Ferrari in  January 2017.   He established cardiology care with me in the office setting on 03/26/2016.  He denies any episodes of chest pain.  The patient has remained fairly active.  He still works at [employer] 4 days per week for 8 hours shifts.  He was treated for bronchitis.  He has noticed some mild shortness of breath with activity. He is unaware of palpitations.  He denies significant leg swelling.  He has not had recent blood work.   He underwent a repeat echo Doppler study on 11/04/2016.  This demonstrated normal systolic function with an EF of 50-55% with mild LVH and no regional wall motion abnormalities.  He had grade 2 diastolic dysfunction.  There was moderate aortic valve sclerosis without stenosis, moderate left atrial dilatation, mild right atrial dilatation, and mild pulmonary hypertension with a PA estimated pressure 36 mm.  6 months, he continues to do well.  He again denies recurrent anginal type symptoms.  I reviewed lab work done by Dr. Rosana Hoes on December 07, 2016.  Total cholesterol was 124, triglycerides 65, HDL 46, and LDL 65.  Glucose was 101.  Creatinine was 0.9.  When I last saw him in February 2019 he was continuing to remain stable from a cardiovascular standpoint. He was taken off Amaryl by Dr. Rosana Hoes.  Subsequent blood work  had shown his glucose increased at 141.  He was mildly anemic with a hemoglobin of 10.9, hematocrit 36.3.  Lipid studies were excellent with a total cholesterol 114, HDL 51, triglycerides 93,  LDL 45.Dakota [name]  He continues to work at [employer] from 9 AM until 6 PM on Thursday, Friday, Saturday, and Sundays.    As I last saw him, he was hospitalized on June 15 and discharged April 11, 2018 with left-sided chest pain not nitrate responsive.  He was subsequently seen by Jory Sims, NP in the office on April 21, 2018 and there was some confusion concerning his medications.  At time losartan was discontinued and he was started on low-dose nitrate therapy.  The past several  months he has remained stable.  Apparently he has been taking furosemide 40 mg every other day isosorbide at dinnertime and is continued to be on aspirin and Plavix.  He is on pravastatin 80 mg at bedtime.  He has noticed recently his blood pressure has been more increased.  He is no longer on the losartan.  He presents for evaluation.   Past Medical History:  Diagnosis Date  . Anginal pain ([place])   . Arthritis   . Coronary artery disease   . Diabetes mellitus   . GERD (gastroesophageal reflux disease)   . History of cardiovascular stress test    Lexiscan Myoview 7/16:  EF 51%, inferior, inferolateral ischemia; Intermediate Risk  . Hypertension   . Ischemic heart disease    a. s/p CABG;  b.  LHC 05/23/15:  S-RPAVB ok, S-D2 ok, S-OM2 99% (s/p Synergy DES), L-LAD ok, EF normal (complicated by pseudoaneurysm)   . Shortness of breath dyspnea   . Smallpox     Past Surgical History:  Procedure Laterality Date  . APPENDECTOMY    . [place]   normal  ([place])  . CARDIAC CATHETERIZATION N/A 05/23/2015   Procedure: Left Heart Cath and Cors/Grafts Angiography;  Surgeon: Troy Sine, MD;  Location: [facility];  Service: Cardiovascular;  Laterality: N/A;  . CARDIAC CATHETERIZATION N/A 05/23/2015   Procedure: Coronary Stent Intervention;  Surgeon: Troy Sine, MD;  Location: [facility];  Service: Cardiovascular;  Laterality: N/A;  . CORONARY ARTERY BYPASS GRAFT  12/2003  . DOPPLER ECHOCARDIOGRAPHY  07/15/10   EF 40-45%, aortic stenosis  . FOOT SURGERY Right   . HERNIA REPAIR Bilateral 1979  . PERCUTANEOUS CORONARY STENT INTERVENTION (PCI-S)  05/23/2015   des    ptca svg    Allergies  Allergen Reactions  . Sulfa Antibiotics Swelling  . Pork-Derived Products     muslim  . Aceon [Perindopril Erbumine] Cough    Current Outpatient Medications  Medication Sig Dispense Refill  . albuterol (PROVENTIL HFA;VENTOLIN HFA) 108 (90 Base) MCG/ACT inhaler Inhale  2 puffs into the lungs every 4 (four) hours as needed for wheezing or shortness of breath. 1 Inhaler 0  . aspirin EC 81 MG tablet Take 81 mg by mouth daily.    . clopidogrel (PLAVIX) 75 MG tablet Take 1 tablet (75 mg total) by mouth daily. NEED OV. 90 tablet 1  . docusate sodium (COLACE) 100 MG capsule Take 100 mg by mouth at bedtime.    . finasteride (PROSCAR) 5 MG tablet Take 5 mg by mouth at bedtime.    . furosemide (LASIX) 20 MG tablet Take 1 tablet (20 mg total) by mouth every other day. 90 tablet 3  . isosorbide mononitrate (IMDUR) 30 MG 24 hr tablet Take 1 tablet (30 mg total) by mouth daily. 90 tablet 1  . mirabegron ER (MYRBETRIQ) 50 MG TB24 tablet Take 50 mg by mouth daily.    . Multiple  Vitamin (MULTIVITAMIN WITH MINERALS) TABS tablet Take 1 tablet by mouth daily.    . Multiple Vitamins-Minerals (PRESERVISION AREDS 2 PO) Take 1 tablet by mouth 2 (two) times daily.     Dakota [name] NITROSTAT 0.4 MG SL tablet PLACE 1 TABLET UNDER THE TONGUE EVERY 5 MINUTES AS NEEDED FOR CHEST PAIN 25 tablet 1  . ONE TOUCH ULTRA TEST test strip 1 each by Other route as needed (glucose monoring).     . pantoprazole (PROTONIX) 40 MG tablet Take 1 tablet (40 mg total) by mouth daily. 90 tablet 3  . pravastatin (PRAVACHOL) 80 MG tablet Take 80 mg by mouth at bedtime.     . terbinafine (LAMISIL) 250 MG tablet Take 250 mg by mouth daily.    Dakota [name] amLODipine (NORVASC) 2.5 MG tablet Take 1 tablet (2.5 mg total) by mouth daily. 90 tablet 3   Current Facility-Administered Medications  Medication Dose Route Frequency Provider Last Rate Last Dose  . SOLUTION-PLUS STOPCOCK MISC   Does not apply Once Lorretta Harp, MD        Social History   Socioeconomic History  . Marital status: Married    Spouse name: Not on file  . Number of children: 2 d  . Years of education: Not on file  . Highest education level: Not on file  Occupational History  . Occupation: Optometrist    Comment: [employer]  Social Needs  . Financial  resource strain: Not on file  . Food insecurity:    Worry: Not on file    Inability: Not on file  . Transportation needs:    Medical: Not on file    Non-medical: Not on file  Tobacco Use  . Smoking status: Never Smoker  . Smokeless tobacco: Never Used  Substance and Sexual Activity  . Alcohol use: No    Alcohol/week: 0.0 standard drinks  . Drug use: No  . Sexual activity: Not on file  Lifestyle  . Physical activity:    Days per week: Not on file    Minutes per session: Not on file  . Stress: Not on file  Relationships  . Social connections:    Talks on phone: Not on file    Gets together: Not on file    Attends religious service: Not on file    Active member of club or organization: Not on file    Attends meetings of clubs or organizations: Not on file    Relationship status: Not on file  . Intimate partner violence:    Fear of current or ex partner: Not on file    Emotionally abused: Not on file    Physically abused: Not on file    Forced sexual activity: Not on file  Other Topics Concern  . Not on file  Social History Narrative  . Not on file    Additional social history is notable in that he had worked at the [employer] for 30 years in the [place].  Family History  Problem Relation Age of Onset  . Healthy Father   . Ulcers Mother   . Stroke Mother    Both parents are deceased.  ROS General: Negative; No fevers, chills, or night sweats HEENT: Negative; No changes in vision or hearing, sinus congestion, difficulty swallowing Pulmonary: Positive for remote bronchitis Cardiovascular: See HPI: No chest pain, presyncope, syncope, palpatations GI: Negative; No nausea, vomiting, diarrhea, or abdominal pain GU: Negative; No dysuria, hematuria, or difficulty voiding Musculoskeletal: Negative; no myalgias, joint pain,  or weakness Hematologic: Negative; no easy bruising, bleeding Endocrine: Positive for diabetes mellitus Neuro:  Negative; no changes in balance, headaches Skin: Negative; No rashes or skin lesions Psychiatric: Negative; No behavioral problems, depression Sleep: Negative; No snoring,  daytime sleepiness, hypersomnolence, bruxism, restless legs, hypnogognic hallucinations. Other comprehensive 14 point system review is negative   Physical Exam BP (!) 153/74   Pulse (!) 54   Ht '5\' 7"'$  (1.702 m)   Wt 123 lb (55.8 kg)   BMI 19.26 kg/m    Repeat blood pressure by me was 162/70  Wt Readings from Last 3 Encounters:  08/01/18 123 lb (55.8 kg)  04/21/18 121 lb 9.6 oz (55.2 kg)  04/11/18 118 lb 14.4 oz (53.9 kg)   General: Alert, oriented, no distress.  Skin: normal turgor, no rashes, warm and dry HEENT: Normocephalic, atraumatic. Pupils equal round and reactive to light; sclera anicteric; extraocular muscles intact;  Nose without nasal septal hypertrophy Mouth/Parynx benign; Mallinpatti scale 2 Neck: No JVD, no carotid bruits; normal carotid upstroke Lungs: clear to ausculatation and percussion; no wheezing or rales Chest wall: without tenderness to palpitation Heart: PMI not displaced, RRR, s1 s2 normal, 1/6 systolic murmur, no diastolic murmur, no rubs, gallops, thrills, or heaves Abdomen: soft, nontender; no hepatosplenomehaly, BS+; abdominal aorta nontender and not dilated by palpation. Back: no CVA tenderness Pulses 2+ Musculoskeletal: full range of motion, normal strength, no joint deformities Extremities: no clubbing cyanosis or edema, Homan's sign negative  Neurologic: grossly nonfocal; Cranial nerves grossly wnl Psychologic: Normal mood and affect    ECG (independently read by me): Sinus bradycardia with first-degree AV block.  Nonspecific intraventricular block.  First-degree AV block.  February 2019 ECG (independently read by me): Sinus bradycardia 56 bpm.  First-degree AV block with a PR interval at 242 ms.  Left bundle branch block.  May 2018 ECG (independently read by me): Sinus  bradycardia at 59 bpm with PACs and sinus arrhythmia.  Left axis deviation.  Left bundle branch block with repolarization changes.  First-degree AV block with a PR interval at 288 ms.  QTc interval 449 ms.  November 2017 ECG (independently read by me): Probable sinus rhythm with interventricular conduction delay.  Heart rate 58 bpm.  June 2017 ECG (independently read by me): Normal sinus rhythm at 72 bpm with first-degree AV block.  PR interval 234 ms.  LABS:  BMP Latest Ref Rng & Units 04/10/2018 04/09/2018 07/20/2017  Glucose 65 - 99 mg/dL - 152(H) 141(H)  BUN 6 - 20 mg/dL - 24(H) 18  Creatinine 0.61 - 1.24 mg/dL 0.72 0.89 0.73  BUN/Creat Ratio 6 - 22 (calc) - - NOT APPLICABLE  Sodium 096 - 145 mmol/L - 141 139  Potassium 3.5 - 5.1 mmol/L - 4.6 4.3  Chloride 101 - 111 mmol/L - 103 103  CO2 22 - 32 mmol/L - 28 30  Calcium 8.9 - 10.3 mg/dL - 9.0 8.9     Hepatic Function Latest Ref Rng & Units 07/20/2017 09/07/2016 02/13/2014  Total Protein 6.1 - 8.1 g/dL 6.6 6.4 7.0  Albumin 3.6 - 5.1 g/dL - 3.9 3.8  AST 10 - 35 U/L '16 16 19  '$ ALT 9 - 46 U/L '13 11 14  '$ Alk Phosphatase 40 - 115 U/L - 58 54  Total Bilirubin 0.2 - 1.2 mg/dL 0.7 0.7 0.8  Bilirubin, Direct 0.0 - 0.3 mg/dL - - 0.1    CBC Latest Ref Rng & Units 04/10/2018 04/09/2018 07/20/2017  WBC 4.0 - 10.5 K/uL 5.7 6.7  6.4  Hemoglobin 13.0 - 17.0 g/dL 9.6(L) 10.3(L) 10.9(L)  Hematocrit 39.0 - 52.0 % 31.9(L) 34.5(L) 36.3(L)  Platelets 150 - 400 K/uL 181 197 227   Lab Results  Component Value Date   MCV 61.0 (L) 04/10/2018   MCV 60.4 (L) 04/09/2018   MCV 60.3 (L) 07/20/2017    Lab Results  Component Value Date   TSH 1.41 07/20/2017    BNP No results found for: BNP  ProBNP    Component Value Date/Time   PROBNP 137.0 (H) 06/10/2015 1504     Lipid Panel     Component Value Date/Time   CHOL 114 07/20/2017 0949   TRIG 93 07/20/2017 0949   HDL 51 07/20/2017 0949   CHOLHDL 2.2 07/20/2017 0949   VLDL 10 09/07/2016 1027    LDLCALC 45 07/20/2017 0949     RADIOLOGY: No results found.  IMPRESSION:  1. Hx of CABG   2. Coronary artery disease involving native coronary artery of native heart with angina pectoris ([place])   3. Essential hypertension   4. Chronic combined systolic and diastolic congestive heart failure ([place])   5. Grade II diastolic dysfunction   6. Gastroesophageal reflux disease without esophagitis     ASSESSMENT AND PLAN: [name] is a 84 year-old gentleman who underwent CABG surgery in 2005.  He developed  unstable angina  in July 2016 was found to have a 99% stenosis in the vein graft supplying the circumflex marginal vessel just at the distal anastomosis.  This was successfully stented.  He has not had any recurrent anginal therapy since and continues to feel well.  An echo Doppler study on 11/04/2016  was done when he experienced some shortness of breath after an episode of bronchitis revealed mild LVH with EF of 50-55% and grade 2 diastolic dysfunction.  There was aortic valve sclerosis without stenosis, with biatrial enlargement and mild pulmonary hypertension with a PA pressure peaked 36 mm.  I had seen him every 2019 his blood pressure was elevated and I recommended titration of his losartan.  Apparently, ultimately his losartan was discontinued when he was started on nitrate therapy.  He has not had any recurrent chest tightness or pressure.  His blood pressure today is increased and I repeat by me was 162/70 despite being on isosorbide 30 mg and furosemide 40 mg every other day.  I have suggested he change his furosemide to 20 mg daily and I am adding amlodipine 2.5 mg in the morning.  He has been taking his isosorbide at dinner and I have suggested he continue doing this.  He will monitor his blood pressure at home.  If his blood pressure continues to be elevated further titration of amlodipine or reinstitution of low-dose losartan may be necessary.  He is bradycardic but is not having any negative  chronotropic medication.  GERD is controlled with pantoprazole.  He will be going to [facility] in several weeks with his daughter for vacation.  I will see him in 4 months for reevaluation.  Time spent: 25 minutes Troy Sine, MD, [facility]  08/02/2018 2:23 PM

## 2018-08-02 ENCOUNTER — Encounter: Payer: Self-pay | Admitting: Cardiovascular Disease

## 2018-08-05 ENCOUNTER — Other Ambulatory Visit: Payer: Self-pay | Admitting: Cardiovascular Disease

## 2018-11-21 ENCOUNTER — Other Ambulatory Visit: Payer: Self-pay | Admitting: Adult Health

## 2018-12-06 ENCOUNTER — Ambulatory Visit: Payer: BLUE CROSS/BLUE SHIELD | Admitting: Cardiovascular Disease

## 2019-01-14 ENCOUNTER — Other Ambulatory Visit: Payer: Self-pay | Admitting: Cardiovascular Disease

## 2019-02-05 ENCOUNTER — Other Ambulatory Visit: Payer: Self-pay | Admitting: Cardiovascular Disease

## 2019-02-22 ENCOUNTER — Telehealth: Payer: Self-pay | Admitting: Cardiovascular Disease

## 2019-02-22 NOTE — Telephone Encounter (Signed)
Mychart, smartphone, pre reg complete 02/22/19 AF °

## 2019-02-23 ENCOUNTER — Telehealth (INDEPENDENT_AMBULATORY_CARE_PROVIDER_SITE_OTHER): Payer: BLUE CROSS/BLUE SHIELD | Admitting: Cardiovascular Disease

## 2019-02-23 ENCOUNTER — Encounter

## 2019-02-23 VITALS — Ht 64.0 in | Wt 122.0 lb

## 2019-02-23 DIAGNOSIS — I25119 Atherosclerotic heart disease of native coronary artery with unspecified angina pectoris: Secondary | ICD-10-CM | POA: Diagnosis not present

## 2019-02-23 DIAGNOSIS — K219 Gastro-esophageal reflux disease without esophagitis: Secondary | ICD-10-CM

## 2019-02-23 DIAGNOSIS — I10 Essential (primary) hypertension: Secondary | ICD-10-CM | POA: Diagnosis not present

## 2019-02-23 DIAGNOSIS — E785 Hyperlipidemia, unspecified: Secondary | ICD-10-CM

## 2019-02-23 DIAGNOSIS — I5189 Other ill-defined heart diseases: Secondary | ICD-10-CM

## 2019-02-23 DIAGNOSIS — I519 Heart disease, unspecified: Secondary | ICD-10-CM

## 2019-02-23 DIAGNOSIS — Z951 Presence of aortocoronary bypass graft: Secondary | ICD-10-CM

## 2019-02-23 NOTE — Progress Notes (Signed)
Virtual Visit via Video Note   This visit type was conducted due to national recommendations for restrictions regarding the COVID-19 Pandemic (e.g. social distancing) in an effort to limit this patient's exposure and mitigate transmission in our community.  Due to his co-morbid illnesses, this patient is at least at moderate risk for complications without adequate follow up.  This format is felt to be most appropriate for this patient at this time.  All issues noted in this document were discussed and addressed.  A limited physical exam was performed with this format.  Please refer to the patient's chart for his consent to telehealth for [facility].   Evaluation Performed:  Follow-up visit  Date:  02/23/2019   ID:  Dakota [name], DOB [birth year], MRN [id number]  Patient Location: Home Provider Location: Home  PCP:  Tisovec, Fransico Him, MD  Cardiologist:  [name], MD  Electrophysiologist:  None   Chief Complaint: 16-month follow-up cardiology evaluation  History of Present Illness:    Dakota [name] is a 85 y.o. male who has CAD and underwent CABG revascularization surgery in 2005 with a LIMA to the LAD, SVG to the diagonal, SVG to the obtuse marginal, and SVG to his distal RCA.  He has been demonstrated to have combined systolic and diastolic heart failure, left bundle branch block and diabetes mellitus.  In 2013.  An echo Doppler study showed an EF of 50-55% with moderate diastolic dysfunction.  In July 2016 he developed symptoms worrisome for unstable angina.  A nuclear perfusion study was abnormal and demonstrated inferior to inferolateral ischemia.  He underwent cardiac catheterization by me on 05/21/2015.  This revealed low normal global LV function with an EF of 50-55%.  There was significant native CAD with 95% distal left main stenosis, proximal occlusion of the LAD after the first 2 septal perforating arteries, total occlusion of the circumflex at the ostium, and RCA stenoses, 50%  proximally, 50% mid, and total occlusion of the distal RCA after a small PDA-like vessel.  He had a patent LIMA graft supplying the mid LAD.  The vein graft supplying the circumflex marginal had a 99% distal anastomosis stenosis.  He had a patent vein graft supplying the diagonal vessel and a patent vein graft supplying the distal RCA with some collateralization of the AV groove circumflex from the PLA vessel. I performed successful intervention to the vein graft supplying the obtuse marginal vessel and a 3.016 mm Synergy DES stent postdilated to 3.43 mm was inserted with resumption of brisk TIMI-3 flow and 0 residual stenosis.  This procedure was complicated by a small pseudoaneurysm which essentially self thrombosed and he did not require any thrombin injection.  He last saw Dr. Mare Ferrari in January 2017.   He established cardiology care with me in the office setting on 03/26/2016.  He denies any episodes of chest pain.  The patient has remained fairly active.  He still works at [employer] 4 days per week for 8 hours shifts.  He was treated for bronchitis.  He has noticed some mild shortness of breath with activity. He is unaware of palpitations.  He denies significant leg swelling.  He has not had recent blood work.   He underwent a repeat echo Doppler study on 11/04/2016.  This demonstrated normal systolic function with an EF of 50-55% with mild LVH and no regional wall motion abnormalities.  He had grade 2 diastolic dysfunction.  There was moderate aortic valve sclerosis without stenosis, moderate left atrial dilatation,  mild right atrial dilatation, and mild pulmonary hypertension with a PA estimated pressure 36 mm.  6 months, he continues to do well.  He again denies recurrent anginal type symptoms.  I reviewed lab work done by Dr. Rosana Hoes on December 07, 2016.  Total cholesterol was 124, triglycerides 65, HDL 46, and LDL 65.  Glucose was 101.  Creatinine was 0.9.  When I saw him in February 2019 he  was continuing to remain stable from a cardiovascular standpoint. He was taken off Amaryl by Dr. Rosana Hoes.  Subsequent blood work  had shown his glucose increased at 141.  He was mildly anemic with a hemoglobin of 10.9, hematocrit 36.3.  Lipid studies were excellent with a total cholesterol 114, HDL 51, triglycerides 93, LDL 45.[name]  He continues to work at [employer] from 9 AM until 6 PM on Thursday, Friday, Saturday, and Sundays.    He was hospitalized on June 15 and discharged April 11, 2018 with left-sided chest pain not nitrate responsive.  He was subsequently seen by Jory Sims, NP in the office on April 21, 2018 and there was some confusion concerning his medications.  At time losartan was discontinued and he was started on low-dose nitrate therapy.  I last saw him in October 2018 at which time he was doing well for the past several months.  He was  taking furosemide 40 mg every other day isosorbide at dinnertime and  continued to be on aspirin and Plavix.  He is on pravastatin 80 mg at bedtime.  He has noticed recently his blood pressure has been more increased.  He was no longer on the losartan. During that evaluation his blood pressure was increased and I suggested he change his furosemide to 20 mg daily and I added amlodipine 2.5 mg in the morning.  I recommended that he continue to monitor his blood pressure closely and if still elevated further titration may be necessary.  Since I last saw him, he has remained relatively stable.  He denies recurrent episodes of chest pain.  There is no edema.  He never increase the furosemide to daily and is continue to take this every other day.  He has tolerated the addition of amlodipine does not admit to leg swelling.  Prior to the COVID-19 pandemic, he was still working at [employer] and would frequently check his blood pressure and was told that it was consistently normal.  With the [place] pandemic, he has not been working.  He does not have blood pressure  equipment at home.  He is concerned that he has not had any recent lab work since last summer.  He denies PND orthopnea.  At times he admits to mild shortness of breath.  He is unaware of any arrhythmia.  He presents for evaluation.  The patient does not have symptoms concerning for COVID-19 infection (fever, chills, cough, or new shortness of breath).    Past Medical History:  Diagnosis Date   Anginal pain ([place])    Arthritis    Coronary artery disease    Diabetes mellitus    GERD (gastroesophageal reflux disease)    History of cardiovascular stress test    Lexiscan Myoview 7/16:  EF 51%, inferior, inferolateral ischemia; Intermediate Risk   Hypertension    Ischemic heart disease    a. s/p CABG;  b.  LHC 05/23/15:  S-RPAVB ok, S-D2 ok, S-OM2 99% (s/p Synergy DES), L-LAD ok, EF normal (complicated by pseudoaneurysm)    Shortness of breath dyspnea  Smallpox    Past Surgical History:  Procedure Laterality Date   APPENDECTOMY     CARDIAC CATHETERIZATION  1992   normal  ([place])   CARDIAC CATHETERIZATION N/A 05/23/2015   Procedure: Left Heart Cath and Cors/Grafts Angiography;  Surgeon: Troy Sine, MD;  Location: [facility];  Service: Cardiovascular;  Laterality: N/A;   CARDIAC CATHETERIZATION N/A 05/23/2015   Procedure: Coronary Stent Intervention;  Surgeon: Troy Sine, MD;  Location: [facility];  Service: Cardiovascular;  Laterality: N/A;   CORONARY ARTERY BYPASS GRAFT  12/2003   DOPPLER ECHOCARDIOGRAPHY  07/15/10   EF 40-45%, aortic stenosis   FOOT SURGERY Right    HERNIA REPAIR Bilateral 1979   PERCUTANEOUS CORONARY STENT INTERVENTION (PCI-S)  05/23/2015   des    ptca svg     Current Meds  Medication Sig   albuterol (PROVENTIL HFA;VENTOLIN HFA) 108 (90 Base) MCG/ACT inhaler Inhale 2 puffs into the lungs every 4 (four) hours as needed for wheezing or shortness of breath.   amLODipine (NORVASC) 2.5 MG tablet Take 1 tablet (2.5 mg total)  by mouth daily.   aspirin EC 81 MG tablet Take 81 mg by mouth daily.   clopidogrel (PLAVIX) 75 MG tablet Take 1 tablet by mouth once daily   docusate sodium (COLACE) 100 MG capsule Take 100 mg by mouth at bedtime.   finasteride (PROSCAR) 5 MG tablet Take 5 mg by mouth at bedtime.   furosemide (LASIX) 20 MG tablet Take 1 tablet (20 mg total) by mouth every other day.   isosorbide mononitrate (IMDUR) 30 MG 24 hr tablet TAKE 1 TABLET BY MOUTH ONCE DAILY   mirabegron ER (MYRBETRIQ) 50 MG TB24 tablet Take 50 mg by mouth daily.   Multiple Vitamin (MULTIVITAMIN WITH MINERALS) TABS tablet Take 1 tablet by mouth daily.   Multiple Vitamins-Minerals (PRESERVISION AREDS 2 PO) Take 1 tablet by mouth 2 (two) times daily.    NITROSTAT 0.4 MG SL tablet PLACE 1 TABLET UNDER THE TONGUE EVERY 5 MINUTES AS NEEDED FOR CHEST PAIN   ONE TOUCH ULTRA TEST test strip 1 each by Other route as needed (glucose monoring).    pantoprazole (PROTONIX) 40 MG tablet Take 1 tablet (40 mg total) by mouth daily.   pravastatin (PRAVACHOL) 80 MG tablet Take 80 mg by mouth at bedtime.    terbinafine (LAMISIL) 250 MG tablet Take 250 mg by mouth daily.   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  Family Hx: The patient's family history includes Healthy in his father; Stroke in his mother; Ulcers in his mother.  ROS:   Please see the history of present illness.    He denies fevers  chills or night sweats.  He denies any cough, change in taste or smell He is hard of hearing. Breathing is normal and not labored There is no wheezing He believes his pulse is regular He does admit to some mild shortness  of breath He denies abdominal discomfort He denies bleeding There is no leg swelling He is sleeping well All other systems reviewed and are negative.   Prior CV studies:   The following studies were reviewed today:  ECHO Study Conclusions: 04/10/2018  - Left ventricle: The cavity size was normal. Systolic function was   normal. The estimated ejection fraction was in the range of 60%   to 65%. Wall motion was normal; there were no regional wall   motion abnormalities. Doppler parameters are consistent with   abnormal left ventricular relaxation (grade 1 diastolic   dysfunction). Doppler parameters are consistent with high   ventricular filling pressure. - Aortic valve: Valve mobility was restricted. Transvalvular   velocity was within the normal range. There was no stenosis.   There was mild regurgitation. Valve area (VTI): 2.22 cm^2. Valve   area (Vmax): 2.71 cm^2. Valve area (Vmean): 2.43 cm^2. - Mitral valve: Calcified annulus. Transvalvular velocity was   within the normal range. There was no evidence for stenosis.   There was trivial regurgitation. Valve area by continuity   equation (using LVOT flow): 2.45 cm^2. - Left atrium: The atrium was severely dilated. - Right ventricle: The cavity size was normal. Wall thickness was   normal. Systolic function was normal. - Right atrium: The atrium was mildly dilated. - Tricuspid valve: There was no regurgitation.  Impressions:  - Visually there appears to be moderate aortic stenosis. However,   elevated aortic valve gradients were not obtained.   Labs/Other Tests and Data Reviewed:    EKG:  An ECG dated 08/01/2018 was personally reviewed today and demonstrated:  Sinus bradycardia with first-degree AV  block.  Nonspecific intraventricular block.  First-degree AV block.  Recent Labs: 04/09/2018: BUN 24; Potassium 4.6; Sodium 141 04/10/2018: Creatinine, Ser 0.72; Hemoglobin 9.6; Platelets 181   Recent Lipid Panel Lab Results  Component Value Date/Time   CHOL 114 07/20/2017 09:49 AM   TRIG 93 07/20/2017 09:49 AM   HDL 51 07/20/2017 09:49 AM   CHOLHDL 2.2 07/20/2017 09:49 AM   LDLCALC 45 07/20/2017 09:49 AM    Wt Readings from Last 3 Encounters:  02/23/19 122 lb (55.3 kg)  08/01/18 123 lb (55.8 kg)  04/21/18 121 lb 9.6 oz (55.2 kg)     Objective:    Vital Signs:  Ht 5\' 4"  (1.626 m)    Wt 122 lb (55.3 kg)    BMI 20.94 kg/m    He is well-developed and well-nourished in no acute distress. Breathing is normal HEENT is unremarkable There is no neck vein distention Audible wheezing He denied any discomfort to his chest with palpation He denied any abdominal discomfort with palpation He denies any swelling in his legs Neurologically he appears intact He has normal cognition and affect  ASSESSMENT & PLAN:    1. CAD/CABG: CABG revascularization surgery done in 2005.  He developed unstable angina July 2016 and had a subtotal stenosis in the vein graft supplying the circumflex vessel just distal to the anastomosis treated successfully with DES stenting.  No recurrent anginal symptoms.  He continues to be on DAPT therapy. 2. Grade 2 diastolic dysfunction noted on prior echoes.  I suspect this may be contributing to some of his exertional dyspnea.  He has mild increased PA pressure. 3. Aortic sclerosis without definitive stenosis:  4. Essential hypertension: When last evaluated blood pressure was elevated.  Amlodipine was added to his regimen.  He does not have blood pressure monitoring at home but while  he was working at [employer] he would regularly check his blood pressure and this was improved.  He continues to take furosemide 20 mg every other day, amlodipine 2.5 mg daily in addition to  isosorbide mononitrate. 5. Hyperlipidemia with target LDL less than 70.  He continues to be on pravastatin.  He has not had recent laboratory. 6. Recent toenail fungal infection, on antifungal agent.  Has not had recent laboratory will check laboratory to make certain LFTs are normal 7. GERD: Controlled with pantoprazole  COVID-19 Education: The signs and symptoms of COVID-19 were discussed with the patient and how to seek care for testing (follow up with PCP or arrange E-visit).  *The importance of social distancing was discussed today.  Time:   Today, I have spent 25 minutes with the patient with telehealth technology discussing the above problems.     Medication Adjustments/Labs and Tests Ordered: Current medicines are reviewed at length with the patient today.  Concerns regarding medicines are outlined above.   Tests Ordered: No orders of the defined types were placed in this encounter.   Medication Changes: No orders of the defined types were placed in this encounter.   Disposition:  Follow up: At his convenience we will obtain fasting laboratory with a comprehensive metabolic panel, CBC, TSH, and lipid studies.  Office visit follow-up in 4 months  Signed, [name], MD  02/23/2019 3:07 PM    [place]

## 2019-02-23 NOTE — Patient Instructions (Signed)
Medication Instructions:  The current medical regimen is effective;  continue present plan and medications.  If you need a refill on your cardiac medications before your next appointment, please call your pharmacy.   Lab work: Fasting lab work (CBC, CMET, TSH, LIPID)  Attached are the lab orders that are needed before your upcoming appointment, please come in anytime to have your labs drawn.   They are fasting labs, so nothing to eat or drink after midnight.  Lab hours: 8:00-4:00 lunch hours 12:45-1:45  If you have labs (blood work) drawn today and your tests are completely normal, you will receive your results only by: [name] MyChart Message (if you have MyChart) OR . A paper copy in the mail If you have any lab test that is abnormal or we need to change your treatment, we will call you to review the results.   Follow-Up: At [facility], you and your health needs are our priority.  As part of our continuing mission to provide you with exceptional heart care, we have created designated Provider Care Teams.  These Care Teams include your primary Cardiologist (physician) and Advanced Practice Providers (APPs -  Physician Assistants and Nurse Practitioners) who all work together to provide you with the care you need, when you need it. You will need a follow up appointment in 4 months. You may see [name], MD or one of the following Advanced Practice Providers on your designated Care Team: [place], [place] . Fabian Sharp, PA-C

## 2019-02-23 NOTE — Addendum Note (Signed)
Addended by: Caprice Beaver T on: 02/23/2019 03:51 PM   Modules accepted: Orders

## 2019-02-25 LAB — TSH: TSH: 1.26 u[IU]/mL (ref 0.450–4.500)

## 2019-02-25 LAB — COMPREHENSIVE METABOLIC PANEL
ALT: 13 IU/L (ref 0–44)
AST: 17 IU/L (ref 0–40)
Albumin/Globulin Ratio: 2 (ref 1.2–2.2)
Albumin: 4.4 g/dL (ref 3.5–4.6)
Alkaline Phosphatase: 71 IU/L (ref 39–117)
BUN/Creatinine Ratio: 26 — ABNORMAL HIGH (ref 10–24)
BUN: 19 mg/dL (ref 10–36)
Bilirubin Total: 0.7 mg/dL (ref 0.0–1.2)
CO2: 24 mmol/L (ref 20–29)
Calcium: 9.2 mg/dL (ref 8.6–10.2)
Chloride: 100 mmol/L (ref 96–106)
Creatinine, Ser: 0.74 mg/dL — ABNORMAL LOW (ref 0.76–1.27)
GFR calc Af Amer: 93 mL/min/{1.73_m2} (ref >59)
GFR calc non Af Amer: 81 mL/min/{1.73_m2} (ref >59)
Globulin, Total: 2.2 g/dL (ref 1.5–4.5)
Glucose: 116 mg/dL — ABNORMAL HIGH (ref 65–99)
Potassium: 4.2 mmol/L (ref 3.5–5.2)
Sodium: 140 mmol/L (ref 134–144)
Total Protein: 6.6 g/dL (ref 6.0–8.5)

## 2019-02-25 LAB — CBC
Hematocrit: 34 % — ABNORMAL LOW (ref 37.5–51.0)
Hemoglobin: 10.2 g/dL — ABNORMAL LOW (ref 13.0–17.7)
MCH: 18.6 pg — ABNORMAL LOW (ref 26.6–33.0)
MCHC: 30 g/dL — ABNORMAL LOW (ref 31.5–35.7)
MCV: 62 fL — ABNORMAL LOW (ref 79–97)
Platelets: 226 10*3/uL (ref 150–450)
RBC: 5.48 x10E6/uL (ref 4.14–5.80)
RDW: 17.5 % — ABNORMAL HIGH (ref 11.6–15.4)
WBC: 7.1 10*3/uL (ref 3.4–10.8)

## 2019-02-25 LAB — LIPID PANEL
Chol/HDL Ratio: 2.3 ratio (ref 0.0–5.0)
Cholesterol, Total: 126 mg/dL (ref 100–199)
HDL: 55 mg/dL (ref >39)
LDL Calculated: 52 mg/dL (ref 0–99)
Triglycerides: 93 mg/dL (ref 0–149)
VLDL Cholesterol Cal: 19 mg/dL (ref 5–40)

## 2019-04-21 ENCOUNTER — Telehealth: Payer: Self-pay | Admitting: Cardiovascular Disease

## 2019-04-21 NOTE — Telephone Encounter (Signed)
Called and spoke with daughter- advised that message would be reviewed once Dr.Kelly returned to office on Monday 06/29. Daughter verbalized understanding.

## 2019-04-21 NOTE — Telephone Encounter (Signed)
MyChart message has been received concerning same matter and was routed to Dr. [name] and Almyra Free LPN

## 2019-04-21 NOTE — Telephone Encounter (Signed)
Received both- will notify when Dr.Kelly is back in office on Monday

## 2019-04-21 NOTE — Telephone Encounter (Signed)
New Message:   Patient calling concerning that her father has to go back to work and his daughter concerning her not going back to work. Please call patients daughter concerning this matter.

## 2019-04-24 NOTE — Telephone Encounter (Signed)
Left detailed message as noted for patient daughter (DPR).

## 2019-04-24 NOTE — Telephone Encounter (Signed)
Dr.Kelly and i will discuss this evening, and i will more than likely call back tomorrow

## 2019-04-24 NOTE — Telephone Encounter (Signed)
Patient's daughter called, she stated she was waiting for a call back today, but never received one in regard to the matter stated below.  She would like a return call.

## 2019-04-25 NOTE — Telephone Encounter (Signed)
Mychart message sent to patient- regarding staying at home per [facility]. Mychart message was read.

## 2019-05-07 ENCOUNTER — Other Ambulatory Visit: Payer: Self-pay | Admitting: Cardiovascular Disease

## 2019-05-21 ENCOUNTER — Other Ambulatory Visit: Payer: Self-pay | Admitting: Adult Health

## 2019-05-22 ENCOUNTER — Other Ambulatory Visit: Payer: Self-pay | Admitting: *Deleted

## 2019-05-24 MED ORDER — ISOSORBIDE MONONITRATE ER 30 MG PO TB24: 30.0000 mg | ORAL_TABLET | Freq: Every day | ORAL | 2 refills | Status: DC

## 2019-07-17 ENCOUNTER — Ambulatory Visit (INDEPENDENT_AMBULATORY_CARE_PROVIDER_SITE_OTHER): Payer: BC Managed Care – PPO | Admitting: Cardiovascular Disease

## 2019-07-17 ENCOUNTER — Other Ambulatory Visit: Payer: Self-pay

## 2019-07-17 ENCOUNTER — Encounter: Payer: Self-pay | Admitting: Cardiovascular Disease

## 2019-07-17 VITALS — BP 128/59 | HR 59 | Ht 64.0 in | Wt 110.0 lb

## 2019-07-17 DIAGNOSIS — I10 Essential (primary) hypertension: Secondary | ICD-10-CM

## 2019-07-17 DIAGNOSIS — I4891 Unspecified atrial fibrillation: Secondary | ICD-10-CM | POA: Diagnosis not present

## 2019-07-17 DIAGNOSIS — I25119 Atherosclerotic heart disease of native coronary artery with unspecified angina pectoris: Secondary | ICD-10-CM | POA: Diagnosis not present

## 2019-07-17 DIAGNOSIS — Z951 Presence of aortocoronary bypass graft: Secondary | ICD-10-CM

## 2019-07-17 DIAGNOSIS — I519 Heart disease, unspecified: Secondary | ICD-10-CM

## 2019-07-17 DIAGNOSIS — Z79899 Other long term (current) drug therapy: Secondary | ICD-10-CM

## 2019-07-17 DIAGNOSIS — I447 Left bundle-branch block, unspecified: Secondary | ICD-10-CM

## 2019-07-17 DIAGNOSIS — I5189 Other ill-defined heart diseases: Secondary | ICD-10-CM

## 2019-07-17 DIAGNOSIS — E785 Hyperlipidemia, unspecified: Secondary | ICD-10-CM

## 2019-07-17 NOTE — Progress Notes (Signed)
Patient ID: Dakota [name], male   DOB: November 24, 1925, 85 y.o.   MRN: [id number]    Primary M.D.: Dr. Delfino Lovett Tisovic  HPI: Dakota [name] is a 85 y.o. male is a former patient of Dr. Mare Ferrari.  He presents for a 5 month follow-up evaluation.  [name] has CAD and underwent CABG revascularization surgery in 2005 with a LIMA to the LAD, SVG to the diagonal, SVG to the obtuse marginal, and SVG to his distal RCA.  He has been demonstrated to have combined systolic and diastolic heart failure, left bundle branch block and diabetes mellitus.  In 2013.  An echo Doppler study showed an EF of 50-55% with moderate diastolic dysfunction.  In July 2016 he developed symptoms worrisome for unstable angina.  A nuclear perfusion study was abnormal and demonstrated inferior to inferolateral ischemia.  He underwent cardiac catheterization by me on 05/21/2015.  This revealed low normal global LV function with an EF of 50-55%.  There was significant native CAD with 95% distal left main stenosis, proximal occlusion of the LAD after the first 2 septal perforating arteries, total occlusion of the circumflex at the ostium, and RCA stenoses, 50% proximally, 50% mid, and total occlusion of the distal RCA after a small PDA-like vessel.  He had a patent LIMA graft supplying the mid LAD.  The vein graft supplying the circumflex marginal had a 99% distal anastomosis stenosis.  He had a patent vein graft supplying the diagonal vessel and a pain vein graft supplying the distal RCA with some collateralization of the AV groove circumflex from the PLA vessel. I performed successful intervention to the vein graft supplying the obtuse marginal vessel and a 3.016 mm Synergy DES stent postdilated to 3.43 mm was inserted with resumption of brisk TIMI-3 flow and 0 residual stenosis.  This procedure was complicated by a small pseudoaneurysm which essentially self thrombosed and he did not require any thrombin injection.  He last saw Dr. Mare Ferrari in  January 2017.   He established cardiology care with me in the office setting on 03/26/2016.  He denies any episodes of chest pain.  The patient has remained fairly active.  He still works at [employer] 4 days per week for 8 hours shifts.  He was treated for bronchitis.  He has noticed some mild shortness of breath with activity. He is unaware of palpitations.  He denies significant leg swelling.  He has not had recent blood work.   He underwent a repeat echo Doppler study on 11/04/2016.  This demonstrated normal systolic function with an EF of 50-55% with mild LVH and no regional wall motion abnormalities.  He had grade 2 diastolic dysfunction.  There was moderate aortic valve sclerosis without stenosis, moderate left atrial dilatation, mild right atrial dilatation, and mild pulmonary hypertension with a PA estimated pressure 36 mm.  6 months, he continues to do well.  He again denies recurrent anginal type symptoms.  I reviewed lab work done by Dr. Rosana Hoes on December 07, 2016.  Total cholesterol was 124, triglycerides 65, HDL 46, and LDL 65.  Glucose was 101.  Creatinine was 0.9.  When I  saw him in February 2019 he was continuing to remain stable from a cardiovascular standpoint. He was taken off Amaryl by Dr. Rosana Hoes.  Subsequent blood work  had shown his glucose increased at 141.  He was mildly anemic with a hemoglobin of 10.9, hematocrit 36.3.  Lipid studies were excellent with a total cholesterol 114, HDL 51, triglycerides 93,  LDL 45.[name]  He continues to work at [employer] from 9 AM until 6 PM on Thursday, Friday, Saturday, and Sundays.    He was hospitalized on June 15 and discharged April 11, 2018 with left-sided chest pain not nitrate responsive.  He was subsequently seen by Jory Sims, NP in the office on April 21, 2018 and there was some confusion concerning his medications.  At time losartan was discontinued and he was started on low-dose nitrate therapy.  When seen in October 2019 he felt well  but had noticed slight increase in his blood pressure.  He was taking furosemide 40 mg every other day, isosorbide at dinnertime and is continued to be on aspirin and Plavix.  He is no longer on losartan.  He was on pravastatin 80 mg at bedtime.  That evaluation, his blood pressure was elevated and I added amlodipine 2.5 mg in the morning.  He had sinus bradycardia on ECG and was not on any negative chronotropic medication.  Due to the COVID-19 pandemic, I saw him in a telemedicine evaluation on January 27, 2019.  He had tolerated the addition of amlodipine and denied any leg swelling.  Prior to the COVID-19 pandemic he was still working at [employer] would frequently check his blood pressure which he had not been able to do that time.  He was concerned that he had not had laboratory and on May 1 he presented for blood work which showed a total cholesterol 126, triglycerides 93, HDL 55 and LDL 52.  Renal function remained stable with a creatinine of 0.74.  Glucose was mildly increased at 116.  He has thalassemia minor trait with microcytic indices with MCV of 62.  He sees Dr. Osborne Casco for his primary care.  He is concerned about nodule on his left chest wall was wondering about the possibility of having this removed.  He is unaware of any cardiac arrhythmia.  He would like to return to work in January 2021.  He denies recurrent anginal symptoms.  He presents for evaluation.   Past Medical History:  Diagnosis Date   Anginal pain ([place])    Arthritis    Coronary artery disease    Diabetes mellitus    GERD (gastroesophageal reflux disease)    History of cardiovascular stress test    Lexiscan Myoview 7/16:  EF 51%, inferior, inferolateral ischemia; Intermediate Risk   Hypertension    Ischemic heart disease    a. s/p CABG;  b.  LHC 05/23/15:  S-RPAVB ok, S-D2 ok, S-OM2 99% (s/p Synergy DES), L-LAD ok, EF normal (complicated by pseudoaneurysm)    Shortness of breath dyspnea    Smallpox     Past  Surgical History:  Procedure Laterality Date   [place]   normal  ([place])   CARDIAC CATHETERIZATION N/A 05/23/2015   Procedure: Left Heart Cath and Cors/Grafts Angiography;  Surgeon: Troy Sine, MD;  Location: [facility];  Service: Cardiovascular;  Laterality: N/A;   CARDIAC CATHETERIZATION N/A 05/23/2015   Procedure: Coronary Stent Intervention;  Surgeon: Troy Sine, MD;  Location: [facility];  Service: Cardiovascular;  Laterality: N/A;   CORONARY ARTERY BYPASS GRAFT  12/2003   DOPPLER ECHOCARDIOGRAPHY  07/15/10   EF 40-45%, aortic stenosis   FOOT SURGERY Right    HERNIA REPAIR Bilateral 1979   PERCUTANEOUS CORONARY STENT INTERVENTION (PCI-S)  05/23/2015   des    ptca svg    Allergies  Allergen Reactions  Sulfa Antibiotics Swelling   Pork-Derived Products     muslim   Aceon [Perindopril Erbumine] Cough    Current Outpatient Medications  Medication Sig Dispense Refill   albuterol (PROVENTIL HFA;VENTOLIN HFA) 108 (90 Base) MCG/ACT inhaler Inhale 2 puffs into the lungs every 4 (four) hours as needed for wheezing or shortness of breath. 1 Inhaler 0   amLODipine (NORVASC) 2.5 MG tablet Take 1 tablet by mouth once daily 90 tablet 0   aspirin EC 81 MG tablet Take 81 mg by mouth daily.     clopidogrel (PLAVIX) 75 MG tablet Take 1 tablet by mouth once daily 90 tablet 0   docusate sodium (COLACE) 100 MG capsule Take 100 mg by mouth at bedtime.     finasteride (PROSCAR) 5 MG tablet Take 5 mg by mouth at bedtime.     furosemide (LASIX) 20 MG tablet Take 1 tablet (20 mg total) by mouth every other day. 90 tablet 3   isosorbide mononitrate (IMDUR) 30 MG 24 hr tablet Take 1 tablet (30 mg total) by mouth daily. 90 tablet 2   mirabegron ER (MYRBETRIQ) 50 MG TB24 tablet Take 50 mg by mouth daily.     Multiple Vitamin (MULTIVITAMIN WITH MINERALS) TABS tablet Take 1 tablet by mouth daily.     Multiple  Vitamins-Minerals (PRESERVISION AREDS 2 PO) Take 1 tablet by mouth 2 (two) times daily.      NITROSTAT 0.4 MG SL tablet PLACE 1 TABLET UNDER THE TONGUE EVERY 5 MINUTES AS NEEDED FOR CHEST PAIN 25 tablet 1   ONE TOUCH ULTRA TEST test strip 1 each by Other route as needed (glucose monoring).      pantoprazole (PROTONIX) 40 MG tablet Take 1 tablet (40 mg total) by mouth daily. 90 tablet 3   pravastatin (PRAVACHOL) 80 MG tablet Take 80 mg by mouth at bedtime.      terbinafine (LAMISIL) 250 MG tablet Take 250 mg by mouth daily.     Current Facility-Administered Medications  Medication Dose Route Frequency Provider Last Rate Last Dose   SOLUTION-PLUS STOPCOCK MISC   Does not apply Once Lorretta Harp, MD        Social History   Socioeconomic History   Marital status: Married    Spouse name: Not on file   Number of children: 2 d   Years of education: Not on file   Highest education level: Not on file  Occupational History   Occupation: accountant    Comment: [employer]  Social Designer, fashion/clothing strain: Not on file   Food insecurity    Worry: Not on file    Inability: Not on file   Transportation needs    Medical: Not on file    Non-medical: Not on file  Tobacco Use   Smoking status: Never Smoker   Smokeless tobacco: Never Used  Substance and Sexual Activity   Alcohol use: No    Alcohol/week: 0.0 standard drinks   Drug use: No   Sexual activity: Not on file  Lifestyle   Physical activity    Days per week: Not on file    Minutes per session: Not on file   Stress: Not on file  Relationships   Social connections    Talks on phone: Not on file    Gets together: Not on file    Attends religious service: Not on file    Active member of club or organization: Not on file    Attends meetings of clubs or  organizations: Not on file    Relationship status: Not on file   Intimate partner violence    Fear of current or ex partner: Not on file     Emotionally abused: Not on file    Physically abused: Not on file    Forced sexual activity: Not on file  Other Topics Concern   Not on file  Social History Narrative   Not on file    Additional social history is notable in that he had worked at the [employer] for 30 years in the [place].  Family History  Problem Relation Age of Onset   Healthy Father    Ulcers Mother    Stroke Mother    Both parents are deceased.  ROS General: Negative; No fevers, chills, or night sweats HEENT: Negative; No changes in vision or hearing, sinus congestion, difficulty swallowing Pulmonary: Positive for remote bronchitis Cardiovascular: See HPI: No chest pain, presyncope, syncope, palpatations GI: Negative; No nausea, vomiting, diarrhea, or abdominal pain GU: Negative; No dysuria, hematuria, or difficulty voiding Musculoskeletal: Negative; no myalgias, joint pain, or weakness Hematologic: Negative; no easy bruising, bleeding Endocrine: Positive for diabetes mellitus Neuro: Negative; no changes in balance, headaches Skin: Negative; No rashes or skin lesions Psychiatric: Negative; No behavioral problems, depression Sleep: Negative; No snoring,  daytime sleepiness, hypersomnolence, bruxism, restless legs, hypnogognic hallucinations. Other comprehensive 14 point system review is negative   Physical Exam BP (!) 128/59    Pulse (!) 59    Ht _0  (1.626 m)    Wt 110 lb (49.9 kg)    BMI 18.88 kg/m    Repeat blood pressure by me was 130/64  Wt Readings from Last 3 Encounters:  07/17/19 110 lb (49.9 kg)  02/23/19 122 lb (55.3 kg)  08/01/18 123 lb (55.8 kg)   General: Alert, oriented, no distress.  Skin: normal turgor, no rashes, warm and dry; benign-appearing cystic mass, small caliber in the left chest wall HEENT: Normocephalic, atraumatic. Pupils equal round and reactive to light; sclera anicteric; extraocular muscles intact;  Nose without nasal septal  hypertrophy Mouth/Parynx benign; Mallinpatti scale 2 Neck: No JVD, no carotid bruits; normal carotid upstroke Lungs: clear to ausculatation and percussion; no wheezing or rales Chest wall: without tenderness to palpitation Heart: PMI not displaced, mild irregularity, s1 s2 normal, 1/6 systolic murmur, no diastolic murmur, no rubs, gallops, thrills, or heaves Abdomen: soft, nontender; no hepatosplenomehaly, BS+; abdominal aorta nontender and not dilated by palpation. Back: no CVA tenderness Pulses 2+ Musculoskeletal: full range of motion, normal strength, no joint deformities Extremities: no clubbing cyanosis or edema, Homan's sign negative  Neurologic: grossly nonfocal; Cranial nerves grossly wnl Psychologic: Normal mood and affect  ECG (independently read by me): The initial part of the ECG shows sinus bradycardia at 58 bpm with first-degree AV block and then midway through the ECG he seems to revert into atrial fibrillation with a slow ventricular response.  Left bundle branch block with repolarization changes.  QTc interval 455 ms.  October 2019 ECG (independently read by me): Sinus bradycardia with first-degree AV block.  Nonspecific intraventricular block.  First-degree AV block.  February 2019 ECG (independently read by me): Sinus bradycardia 56 bpm.  First-degree AV block with a PR interval at 242 ms.  Left bundle branch block.  May 2018 ECG (independently read by me): Sinus bradycardia at 59 bpm with PACs and sinus arrhythmia.  Left axis deviation.  Left bundle branch block with repolarization changes.  First-degree AV block  with a PR interval at 288 ms.  QTc interval 449 ms.  November 2017 ECG (independently read by me): Probable sinus rhythm with interventricular conduction delay.  Heart rate 58 bpm.  June 2017 ECG (independently read by me): Normal sinus rhythm at 72 bpm with first-degree AV block.  PR interval 234 ms.  LABS:  BMP Latest Ref Rng & Units 02/24/2019 04/10/2018  04/09/2018  Glucose 65 - 99 mg/dL 116(H) - 152(H)  BUN 10 - 36 mg/dL 19 - 24(H)  Creatinine 0.76 - 1.27 mg/dL 0.74(L) 0.72 0.89  BUN/Creat Ratio 10 - 24 26(H) - -  Sodium 134 - 144 mmol/L 140 - 141  Potassium 3.5 - 5.2 mmol/L 4.2 - 4.6  Chloride 96 - 106 mmol/L 100 - 103  CO2 20 - 29 mmol/L 24 - 28  Calcium 8.6 - 10.2 mg/dL 9.2 - 9.0     Hepatic Function Latest Ref Rng & Units 02/24/2019 07/20/2017 09/07/2016  Total Protein 6.0 - 8.5 g/dL 6.6 6.6 6.4  Albumin 3.5 - 4.6 g/dL 4.4 - 3.9  AST 0 - 40 IU/L _0 ALT 0 - 44 IU/L _1 Alk Phosphatase 39 - 117 IU/L 71 - 58  Total Bilirubin 0.0 - 1.2 mg/dL 0.7 0.7 0.7  Bilirubin, Direct 0.0 - 0.3 mg/dL - - -    CBC Latest Ref Rng & Units 02/24/2019 04/10/2018 04/09/2018  WBC 3.4 - 10.8 x10E3/uL 7.1 5.7 6.7  Hemoglobin 13.0 - 17.7 g/dL 10.2(L) 9.6(L) 10.3(L)  Hematocrit 37.5 - 51.0 % 34.0(L) 31.9(L) 34.5(L)  Platelets 150 - 450 x10E3/uL 226 181 197   Lab Results  Component Value Date   MCV 62 (L) 02/24/2019   MCV 61.0 (L) 04/10/2018   MCV 60.4 (L) 04/09/2018    Lab Results  Component Value Date   TSH 1.260 02/24/2019    BNP No results found for: BNP  ProBNP    Component Value Date/Time   PROBNP 137.0 (H) 06/10/2015 1504     Lipid Panel     Component Value Date/Time   CHOL 126 02/24/2019 1058   TRIG 93 02/24/2019 1058   HDL 55 02/24/2019 1058   CHOLHDL 2.3 02/24/2019 1058   CHOLHDL 2.2 07/20/2017 0949   VLDL 10 09/07/2016 1027   LDLCALC 52 02/24/2019 1058   LDLCALC 45 07/20/2017 0949     RADIOLOGY: No results found.  IMPRESSION:  1. Essential hypertension   2. Atrial fibrillation, unspecified type ([place])   3. Medication management   4. Coronary artery disease involving native coronary artery of native heart with angina pectoris ([place])   5. Hx of CABG   6. Grade II diastolic dysfunction   7. Left bundle branch block   8. Hyperlipidemia with target LDL less than 70     ASSESSMENT AND PLAN: [name] is  a 85year-old gentleman who underwent CABG surgery in 2005.  He developed  unstable angina in July 2016 was found to have a 99% stenosis in the vein graft supplying the circumflex marginal vessel just at the distal anastomosis.  This was successfully stented.  He has not had any recurrent anginal therapy since and continues to feel well.  An echo Doppler study on 11/04/2016 demonstrated mild LVH with EF of 50-55% and grade 2 diastolic dysfunction; aortic valve sclerosis without stenosis,  biatrial enlargement and mild pulmonary hypertension with a PA pressure peaked 36 mm.  Over the last several visits his blood pressures have been elevated leading to medication  adjustment.  His blood pressure today, however is stable on his medical regimen of amlodipine 2.5 mg, furosemide 20 mg every other day, isosorbide 30 mg daily.  He is no longer on losartan.  His ECG today has demonstrated initial sinus bradycardia with first-degree AV block which then transitions into atrial fibrillation with slow ventricular rate in the 50s.  He has chronic left bundle branch block.  He continues to be on aspirin and Plavix and with his age of 10 years I will not initiate antiarrhythmic therapy presently.  I am schedule him for a follow-up echo Doppler study to reassess systolic function, his apical systolic murmur and possible apical rub.  He is unaware of any heart rhythm disturbance.  He continues to be on pravastatin 80 mg for hyperlipidemia.  In May 2020 LDL cholesterol was excellent at 52.  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  I am rechecking a be met, magnesium and TSH level in the office today.  With his apparent PAF on antiplatelet therapy at present I have recommended he defer any consideration of excision of his small cystic nodule from his chest wall.  I will see  him back in the office in 3 to 4 weeks.  I am spent: 25 minutes Troy Sine, MD, [facility]  07/17/2019 6:44 PM

## 2019-07-17 NOTE — Patient Instructions (Signed)
Medication Instructions:  The current medical regimen is effective;  continue present plan and medications as directed. Please refer to the Current Medication list given to you today. If you need a refill on your cardiac medications before your next appointment, please call your pharmacy.  Labwork: BMET, TSH AND MAG TODAY HERE IN OUR OFFICE AT [facility]  Take the provided lab slips with you to the lab for your blood draw.   When you have your labs (blood work) drawn today and your tests are completely normal, you will receive your results only by MyChart Message (if you have MyChart) -OR-  A paper copy in the mail.  If you have any lab test that is abnormal or we need to change your treatment, we will call you to review these results.  Testing/Procedures: Echocardiogram - Your physician has requested that you have an echocardiogram. Echocardiography is a painless test that uses sound waves to create images of your heart. It provides your doctor with information about the size and shape of your heart and how well your heart's chambers and valves are working. This procedure takes approximately one hour. There are no restrictions for this procedure. This will be performed at our [facility] location - [address].  Follow-Up: You will need a follow up appointment in 3-4 weeks AFTER ECHO.  You may see [name], MD or one of the following Advanced Practice Providers on your designated Care Team:  Almyra Deforest, PA-C  Fabian Sharp, PA-C     At [facility], you and your health needs are our priority.  As part of our continuing mission to provide you with exceptional heart care, we have created designated Provider Care Teams.  These Care Teams include your primary Cardiologist (physician) and Advanced Practice Providers (APPs -  Physician Assistants and Nurse Practitioners) who all work together to provide you with the care you need, when you need it.  Thank you for choosing [facility] at  [facility]!!

## 2019-07-18 LAB — BASIC METABOLIC PANEL
BUN/Creatinine Ratio: 38 — ABNORMAL HIGH (ref 10–24)
BUN: 28 mg/dL (ref 10–36)
CO2: 24 mmol/L (ref 20–29)
Calcium: 9.1 mg/dL (ref 8.6–10.2)
Chloride: 98 mmol/L (ref 96–106)
Creatinine, Ser: 0.74 mg/dL — ABNORMAL LOW (ref 0.76–1.27)
GFR calc Af Amer: 93 mL/min/{1.73_m2} (ref >59)
GFR calc non Af Amer: 81 mL/min/{1.73_m2} (ref >59)
Glucose: 142 mg/dL — ABNORMAL HIGH (ref 65–99)
Potassium: 4.7 mmol/L (ref 3.5–5.2)
Sodium: 136 mmol/L (ref 134–144)

## 2019-07-18 LAB — TSH: TSH: 0.955 u[IU]/mL (ref 0.450–4.500)

## 2019-07-19 ENCOUNTER — Other Ambulatory Visit: Payer: Self-pay

## 2019-07-19 ENCOUNTER — Ambulatory Visit (HOSPITAL_COMMUNITY): Payer: BC Managed Care – PPO | Attending: Cardiology

## 2019-07-19 DIAGNOSIS — Z79899 Other long term (current) drug therapy: Secondary | ICD-10-CM

## 2019-07-19 DIAGNOSIS — I4891 Unspecified atrial fibrillation: Secondary | ICD-10-CM | POA: Insufficient documentation

## 2019-08-02 NOTE — Progress Notes (Signed)
Cardiology Office Note:    Date:  08/07/2019   ID:  ALEXANDRU SARDUY, DOB 1926/10/25, MRN [id number]  PCP:  [name], MD  Cardiologist:  [name], MD   Referring MD: [name], MD   Hypertension, review echocardiogram results.  History of Present Illness:    Dakota [name] is a 85 y.o. male with a past medical history of left bundle branch block, combined systolic and diastolic heart failure, CAD, essential hypertension, moderate COPD, diabetes, and nonspecific chest pain.  He was last seen by Dr. [name] on 07/17/2019.  During that time he felt well and was considering return to work as well or greater in January 2021.  However, due to the COVID-19 pandemic it was recommended that he not return to work at this time.  An echocardiogram on 07/19/2019 to evaluate systolic function showed an LVEF of 50 to 55%, a mildly dilated left atrium, moderately calcified mitral valve, severe aortic valve annular calcification, mitral valve regurgitation, mild tricuspid valve regurgitation, and mildly elevated pulmonary artery systolic pressure.  He underwent CABG in 2005 with a LIMA to LAD, SVG to diagonal, SVG to obtuse marginal and SVG to distal RCA.  He has a history of combined systolic and diastolic heart failure, left bundle branch block and diabetes mellitus.  In July 2016 he developed symptoms worrisome for unstable angina.  A nuclear stress test was abnormal and demonstrated inferior to inferolateral ischemia.  He underwent cardiac catheterization on 05/21/2015.  His catheterization showed low normal global LV function with an EF of 50 to 55%.  The catheterization also demonstrated coronary artery disease with 95% native distal left main stenosis with proximal occlusion of the LAD after the first 2 septal perforating arteries total occlusion of the circumflex at the ostium and RCA stenosis with 50% proximal, 50% mid and total occlusion of the distal RCA.  His LIMA graft was patent to his mid  LAD.  This vein graft supplying the circumflex marginal and 99% distal stenosis.  He also had a patent vein graft supplying the diagonal vessel and a patent vein graft to the distal RCA.  A DES x1 to the obtuse marginal vein graft was placed at that time.  He presents the clinic today and states he feels well.  He continues to be active outside walking daily.  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  However, his daughter does not think this is a good idea.  Given his comorbidities and cardiac history with increased risk of COVID-19 he should not return to work at this time.  I will give him a work note.  He has no other concerns or complaints at this time.  He denies chest pain, increased shortness of breath, lower extremity edema, fatigue, palpitations, melena, hematuria, hemoptysis, diaphoresis, weakness, presyncope, syncope, orthopnea, and PND.   Past Medical History:  Diagnosis Date   Anginal pain ([place])    Arthritis    Coronary artery disease    Diabetes mellitus    GERD (gastroesophageal reflux disease)    History of cardiovascular stress test    Lexiscan Myoview 7/16:  EF 51%, inferior, inferolateral ischemia; Intermediate Risk   Hypertension    Ischemic heart disease    a. s/p CABG;  b.  LHC 05/23/15:  S-RPAVB ok, S-D2 ok, S-OM2 99% (s/p Synergy DES), L-LAD ok, EF normal (complicated by pseudoaneurysm)    Shortness of breath dyspnea    Smallpox  Past Surgical History:  Procedure Laterality Date   APPENDECTOMY     CARDIAC CATHETERIZATION  1992   normal  ([place])   CARDIAC CATHETERIZATION N/A 05/23/2015   Procedure: Left Heart Cath and Cors/Grafts Angiography;  Surgeon: Troy Sine, MD;  Location: [facility];  Service: Cardiovascular;  Laterality: N/A;   CARDIAC CATHETERIZATION N/A 05/23/2015   Procedure: Coronary Stent Intervention;  Surgeon: Troy Sine, MD;  Location: [facility];  Service: Cardiovascular;  Laterality:  N/A;   CORONARY ARTERY BYPASS GRAFT  12/2003   DOPPLER ECHOCARDIOGRAPHY  07/15/10   EF 40-45%, aortic stenosis   FOOT SURGERY Right    HERNIA REPAIR Bilateral 1979   PERCUTANEOUS CORONARY STENT INTERVENTION (PCI-S)  05/23/2015   des    ptca svg    Current Medications: Current Meds  Medication Sig   albuterol (PROVENTIL HFA;VENTOLIN HFA) 108 (90 Base) MCG/ACT inhaler Inhale 2 puffs into the lungs every 4 (four) hours as needed for wheezing or shortness of breath.   amLODipine (NORVASC) 2.5 MG tablet Take 1 tablet by mouth once daily   aspirin EC 81 MG tablet Take 81 mg by mouth daily.   clopidogrel (PLAVIX) 75 MG tablet Take 1 tablet by mouth once daily   docusate sodium (COLACE) 100 MG capsule Take 100 mg by mouth at bedtime.   finasteride (PROSCAR) 5 MG tablet Take 5 mg by mouth at bedtime.   furosemide (LASIX) 20 MG tablet Take 1 tablet (20 mg total) by mouth every other day.   isosorbide mononitrate (IMDUR) 30 MG 24 hr tablet Take 1 tablet (30 mg total) by mouth daily.   mirabegron ER (MYRBETRIQ) 50 MG TB24 tablet Take 50 mg by mouth daily.   Multiple Vitamin (MULTIVITAMIN WITH MINERALS) TABS tablet Take 1 tablet by mouth daily.   Multiple Vitamins-Minerals (PRESERVISION AREDS 2 PO) Take 1 tablet by mouth 2 (two) times daily.    NITROSTAT 0.4 MG SL tablet PLACE 1 TABLET UNDER THE TONGUE EVERY 5 MINUTES AS NEEDED FOR CHEST PAIN   ONE TOUCH ULTRA TEST test strip 1 each by Other route as needed (glucose monoring).    pantoprazole (PROTONIX) 40 MG tablet Take 1 tablet (40 mg total) by mouth daily.   pravastatin (PRAVACHOL) 80 MG tablet Take 80 mg by mouth at bedtime.    terbinafine (LAMISIL) 250 MG tablet Take 250 mg by mouth daily.   Current Facility-Administered Medications for the 08/07/19 encounter (Office Visit) with Ledora Bottcher, PA  Medication   SOLUTION-PLUS STOPCOCK MISC     Allergies:   Sulfa antibiotics, Pork-derived products, and Aceon  [perindopril erbumine]   Social History   Socioeconomic History   Marital status: Married    Spouse name: Not on file   Number of children: 2 d   Years of education: Not on file   Highest education level: Not on file  Occupational History   Occupation: Optometrist    Comment: [employer]  Social Designer, fashion/clothing strain: Not on file   Food insecurity    Worry: Not on file    Inability: Not on file   Transportation needs    Medical: Not on file    Non-medical: Not on file  Tobacco Use   Smoking status: Never Smoker   Smokeless tobacco: Never Used  Substance and Sexual Activity   Alcohol use: No    Alcohol/week: 0.0 standard drinks   Drug use: No   Sexual activity: Not on file  Lifestyle   Physical activity    Days per week: Not on file    Minutes per session: Not on file   Stress: Not on file  Relationships   Social connections    Talks on phone: Not on file    Gets together: Not on file    Attends religious service: Not on file    Active member of club or organization: Not on file    Attends meetings of clubs or organizations: Not on file    Relationship status: Not on file  Other Topics Concern   Not on file  Social History Narrative   Not on file     Family History: The patient's  family history includes Healthy in his father; Stroke in his mother; Ulcers in his mother.  ROS:   Please see the history of present illness.     All other systems reviewed and are negative.  EKGs/Labs/Other Studies Reviewed:    The following studies were reviewed today: Echocardiogram 07/19/2019 IMPRESSIONS    1. Left ventricular ejection fraction, by visual estimation, is 50 to 55%. The left ventricle has normal function. There is no left ventricular hypertrophy.  2. Left ventricular diastolic Doppler parameters are consistent with impaired relaxation pattern of LV diastolic filling.  3. Global right ventricle has normal systolic function.The right  ventricular size is normal. No increase in right ventricular wall thickness.  4. Left atrial size was mildly dilated.  5. Right atrial size was normal.  6. Moderate calcification of the mitral valve leaflet(s).  7. Moderate thickening of the mitral valve leaflet(s).  8. Severe aortic valve annular calcification.  9. The mitral valve is abnormal. Trace mitral valve regurgitation. 10. The tricuspid valve is grossly normal. Tricuspid valve regurgitation is mild. 11. The aortic valve has an indeterminant number of cusps Aortic valve regurgitation was not visualized by color flow Doppler. 12. The aortic valve is heavily calcified. The leaflet mobility appears to be reduced but there was not a significant AV gradient detected. 13. The pulmonic valve was grossly normal. Pulmonic valve regurgitation is not visualized by color flow Doppler. 14. Mildly elevated pulmonary artery systolic pressure. 15. The atrial septum is grossly normal.  Echocardiogram 04/10/2018 Left ventricle: The cavity size was normal. Systolic function was   normal. The estimated ejection fraction was in the range of 60%   to 65%. Wall motion was normal; there were no regional wall   motion abnormalities. Doppler parameters are consistent with   abnormal left ventricular relaxation (grade 1 diastolic   dysfunction). Doppler parameters are consistent with high   ventricular filling pressure. - Aortic valve: Valve mobility was restricted. Transvalvular   velocity was within the normal range. There was no stenosis.   There was mild regurgitation. Valve area (VTI): 2.22 cm^2. Valve   area (Vmax): 2.71 cm^2. Valve area (Vmean): 2.43 cm^2. - Mitral valve: Calcified annulus. Transvalvular velocity was   within the normal range. There was no evidence for stenosis.   There was trivial regurgitation. Valve area by continuity   equation (using LVOT flow): 2.45 cm^2. - Left atrium: The atrium was severely dilated. - Right ventricle: The  cavity size was normal. Wall thickness was   normal. Systolic function was normal. - Right atrium: The atrium was mildly dilated. - Tricuspid valve: There was no regurgitation.  EKG:  EKG is not ordered today.    EKG 07/17/2019 Atrial fibrillation with slow ventricular response left axis deviation left bundle branch block 59 bpm  Recent  Labs: 02/24/2019: ALT 13; Hemoglobin 10.2; Platelets 226 07/17/2019: BUN 28; Creatinine, Ser 0.74; Potassium 4.7; Sodium 136; TSH 0.955  Recent Lipid Panel    Component Value Date/Time   CHOL 126 02/24/2019 1058   TRIG 93 02/24/2019 1058   HDL 55 02/24/2019 1058   CHOLHDL 2.3 02/24/2019 1058   CHOLHDL 2.2 07/20/2017 0949   VLDL 10 09/07/2016 1027   LDLCALC 52 02/24/2019 1058   LDLCALC 45 07/20/2017 0949    Physical Exam:    VS:  BP 125/74    Pulse 60    Temp 97.8 F (36.6 C)    Ht 5\' 4"  (1.626 m)    Wt 113 lb (51.3 kg)    SpO2 93%    BMI 19.40 kg/m     Wt Readings from Last 3 Encounters:  08/07/19 113 lb (51.3 kg)  07/17/19 110 lb (49.9 kg)  02/23/19 122 lb (55.3 kg)     GEN:  Well nourished, well developed in no acute distress HEENT: Normal NECK: No JVD; No carotid bruits LYMPHATICS: No lymphadenopathy CARDIAC: Irregularly irregular, no murmurs, rubs, gallops RESPIRATORY:  Clear to auscultation without rales, wheezing or rhonchi  ABDOMEN: Soft, non-tender, non-distended MUSCULOSKELETAL:  No edema; No deformity  SKIN: Warm and dry NEUROLOGIC:  Alert and oriented x 3 PSYCHIATRIC:  Normal affect   ASSESSMENT / Plan   Essential hypertension-blood pressure today 125/74 Continue isosorbide mononitrate 30 mg tablet daily Increase physical activity as tolerated Heart healthy low-sodium diet  Atrial fibrillation-heart rate today 60 bpm.  Echocardiogram from 07/19/2019 showed an LVEF of 50 to 55%,a mildly dilated left atrium, moderately calcified mitral valve, severe aortic valve annular calcification, mitral valve regurgitation, mild  tricuspid valve regurgitation, and mildly elevated pulmonary artery systolic pressure. Continue furosemide 20 mg tablet every other day  Hyperlipidemia-02/24/2019: Cholesterol, Total 126; HDL 55; LDL Calculated 52; Triglycerides 93 Continue pravastatin 80 mg tablet daily Heart healthy high-fiber low-sodium diet Increase physical activity as tolerated  Coronary artery disease-no chest pain today.  Patient states he has had no chest pain since his last stent placement 2016. Continue amlodipine 2.5 mg tablet daily Continue aspirin 81 mg tablet daily Continue clopidogrel 75 mg tablet daily Continue isosorbide mononitrate 30 mg tablet daily Continue nitroglycerin 0.4 mg sublingual tablet as needed Heart healthy high-fiber low-sodium diet Increase physical activity as tolerated  Out of work note given.  Explained to patient that his comorbidities and cardiac issues put him at increased risk during COVID-19 pandemic.  Reevaluate in 4 months.  In order of problems listed above:  Atrial fibrillation, unspecified type [facility])  Coronary artery disease involving native coronary artery of native heart with angina pectoris [facility])  Essential hypertension  Hypercholesterolemia   Medication Adjustments/Labs and Tests Ordered: Current medicines are reviewed at length with the patient today.  Concerns regarding medicines are outlined above.  No orders of the defined types were placed in this encounter.  No orders of the defined types were placed in this encounter.  Disposition: Follow-up with Dr. [name] in 4 months.  Alicia Amel, NP-C 08/07/2019 4:38 PM    [facility]

## 2019-08-03 IMAGING — CR DG CHEST 2V
2 series · 2 of 2 positions shown · non-contrast
Comparison: Radiograph 01/25/2017, CT 07/22/2015

CLINICAL DATA: Left-sided chest pain, onset yesterday.  Nausea.

EXAM:
CHEST - 2 VIEW

[chest pa]
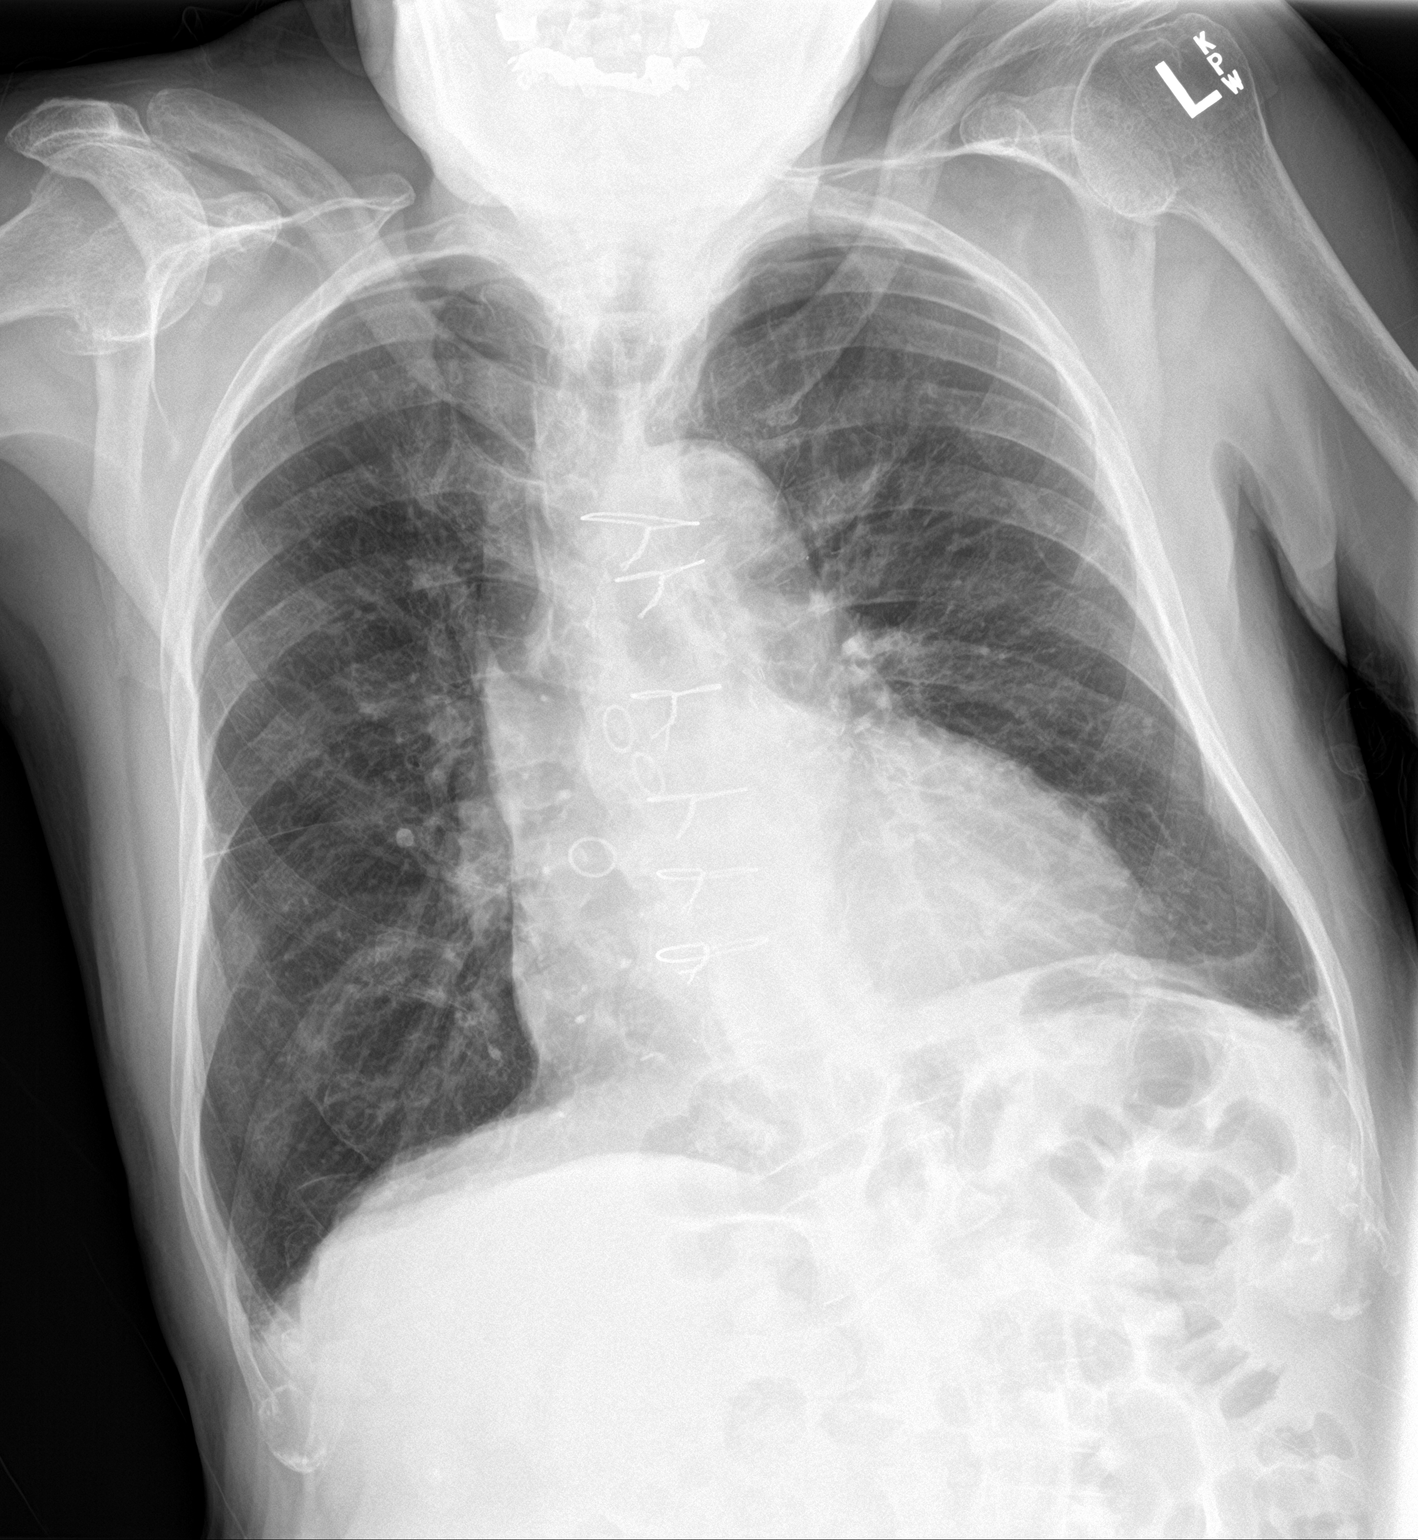

[chest lat]
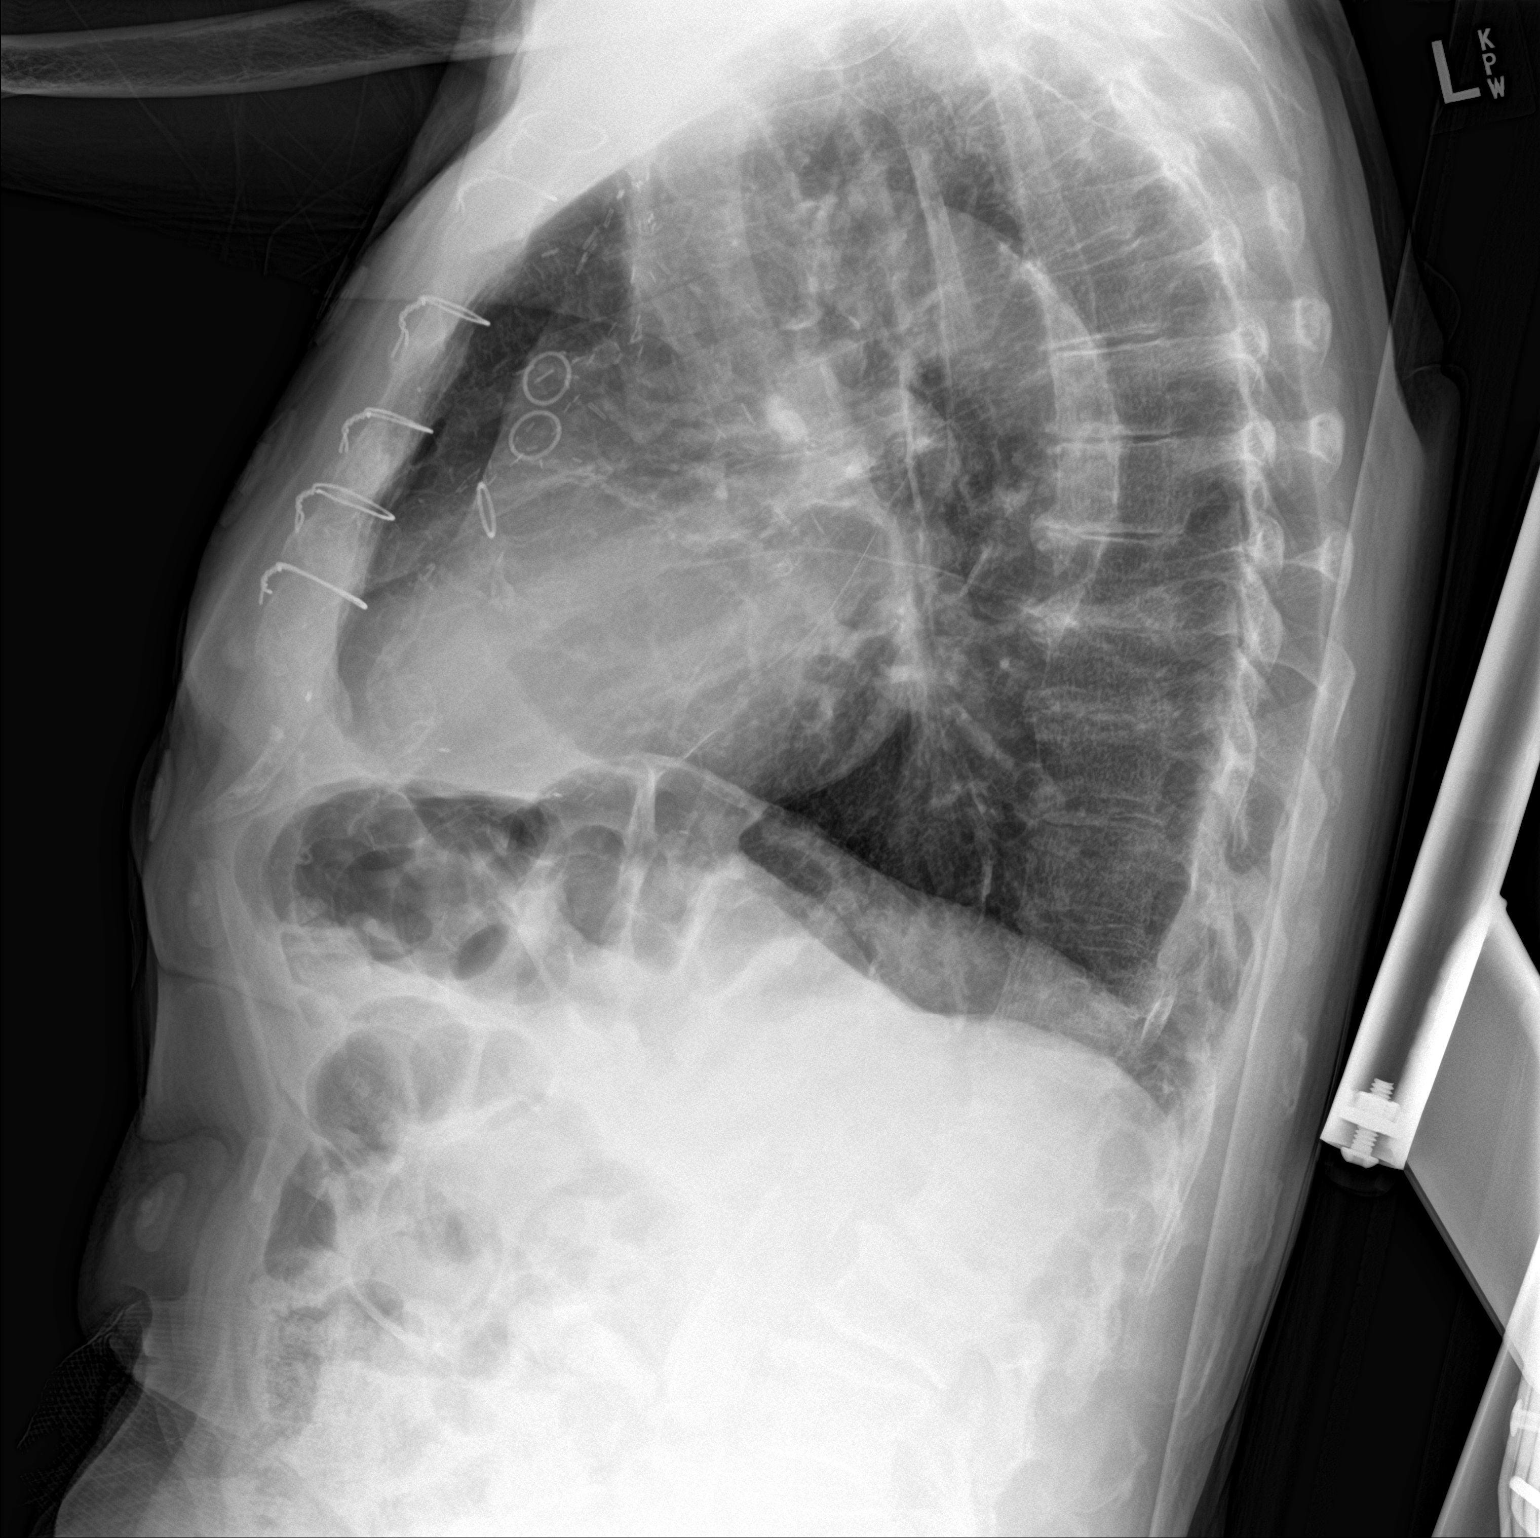

[2 of 2 positions shown; findings below may reference images not displayed]

FINDINGS: Post median sternotomy and CABG. Mild cardiomegaly and tortuous
atherosclerotic aorta, unchanged. Chronic blunting of the
costophrenic angles without pleural effusion. No pulmonary edema. No
focal airspace disease. No pneumothorax. Scoliotic curvature in
degenerative change in the spine.
IMPRESSION: 1. Post CABG with stable cardiomegaly and tortuous atherosclerotic
thoracic aorta. Aortic Atherosclerosis ([id number]).
2. No congestive failure or acute pulmonary process.

## 2019-08-07 ENCOUNTER — Telehealth: Payer: Self-pay | Admitting: Physician Assistant

## 2019-08-07 ENCOUNTER — Other Ambulatory Visit: Payer: Self-pay

## 2019-08-07 ENCOUNTER — Encounter: Payer: Self-pay | Admitting: Physician Assistant

## 2019-08-07 ENCOUNTER — Encounter: Payer: Self-pay | Admitting: General Practice

## 2019-08-07 ENCOUNTER — Ambulatory Visit (INDEPENDENT_AMBULATORY_CARE_PROVIDER_SITE_OTHER): Payer: BC Managed Care – PPO | Admitting: General Practice

## 2019-08-07 VITALS — BP 125/74 | HR 60 | Temp 97.8°F | Ht 64.0 in | Wt 113.0 lb

## 2019-08-07 DIAGNOSIS — I10 Essential (primary) hypertension: Secondary | ICD-10-CM | POA: Diagnosis not present

## 2019-08-07 DIAGNOSIS — I4891 Unspecified atrial fibrillation: Secondary | ICD-10-CM

## 2019-08-07 DIAGNOSIS — I25119 Atherosclerotic heart disease of native coronary artery with unspecified angina pectoris: Secondary | ICD-10-CM

## 2019-08-07 DIAGNOSIS — E7800 Pure hypercholesterolemia, unspecified: Secondary | ICD-10-CM | POA: Diagnosis not present

## 2019-08-07 NOTE — Patient Instructions (Signed)
Medication Instructions:  Your physician recommends that you continue on your current medications as directed. Please refer to the Current Medication list given to you today.  If you need a refill on your cardiac medications before your next appointment, please call your pharmacy.    Follow-Up: At [facility], you and your health needs are our priority.  As part of our continuing mission to provide you with exceptional heart care, we have created designated Provider Care Teams.  These Care Teams include your primary Cardiologist (physician) and Advanced Practice Providers (APPs -  Physician Assistants and Nurse Practitioners) who all work together to provide you with the care you need, when you need it. You will need a follow up appointment in 4 months.  Please call our office 2 months in advance to schedule this appointment.  You may see [name], MD or one of the following Advanced Practice Providers on your designated Care Team: [place], [place] . Fabian Sharp, PA-C

## 2019-08-07 NOTE — Telephone Encounter (Signed)
New Message:   Pt's daughter called. She said pt have an appointment today with Fabian Sharp and she will need to come in with him. She says pt have a problem hearing, so she needs to be with him please.F

## 2019-08-27 ENCOUNTER — Other Ambulatory Visit: Payer: Self-pay | Admitting: Cardiovascular Disease

## 2019-08-30 ENCOUNTER — Other Ambulatory Visit: Payer: Self-pay

## 2019-08-30 MED ORDER — CLOPIDOGREL BISULFATE 75 MG PO TABS: 75.0000 mg | ORAL_TABLET | Freq: Every day | ORAL | 0 refills | Status: DC

## 2019-09-26 ENCOUNTER — Ambulatory Visit: Payer: Self-pay | Admitting: General Surgery

## 2019-09-27 ENCOUNTER — Telehealth: Payer: Self-pay | Admitting: Cardiovascular Disease

## 2019-09-27 NOTE — Telephone Encounter (Signed)
Pt c/o swelling: STAT is pt has developed SOB within 24 hours  1) How much weight have you gained and in what time span? Hasn't weighed patient   2) If swelling, where is the swelling located? Lower legs   3) Are you currently taking a fluid pill? Yes, two daily due to swelling  4) Are you currently SOB? No  5) Do you have a log of your daily weights (if so, list)? No   6) Have you gained 3 pounds in a day or 5 pounds in a week? No   7) Have you traveled recently? No   Patients daughter is calling in regards to patient not being able to feel right arm. She states he cannot write with it or pick anything up. She states he is also having excessive swelling in legs. Please Advise.

## 2019-09-27 NOTE — Progress Notes (Signed)
Cardiology Office Note:    Date:  09/29/2019   ID:  Dakota [name], DOB 06-23-1926, MRN [id number]  PCP:  Dakota Pao, MD  Cardiologist:  Dakota Majestic, MD  Referring MD: Dakota Pao, MD   Chief Complaint  Patient presents with  . Extremity Weakness    Right arm    History of Present Illness:    Dakota [name] is a 85 y.o. male with a past medical history significant for bundle branch block, combined systolic and diastolic heart failure, CAD s/p CABG 2005,S/P DES to OM graft 2016, essential hypertension, moderate COPD, diabetes and nonspecific chest pain.  An echocardiogram on 07/19/2019 to evaluate systolic function showed an LVEF of 50 to 55%, a mildly dilated left atrium, moderately calcified mitral valve, severe aortic valve annular calcification, mitral valve regurgitation, mild tricuspid valve regurgitation, and mildly elevated pulmonary artery systolic pressure.  The patient was seen in the office on 07/17/2019 by Dr. Claiborne [name] and apparently had possible atrial fibrillation with slow ventricular response.   He was last seen in the office on 08/07/2019 at which time he was doing well, continuing to be active outside and walking daily.  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  The patient's daughter called our office on 09/27/2019 with reports of the patient having leg swelling for the prior 2 days.  He was also having right arm weakness, could not write or pick up anything.  There was apparently no other neurologic deficits.  The patient was advised to go to the ED but the patient was refusing.  His daughter tried to take him to the urgent care on Friday but the wait was too long and she was fearful of her father contracting Covid at his age of 35.  The patient is here today for further evaluation with his daughter. He is hard of hearing but alert, oriented and appropriate. His  daughter says that sometime before Thanksgiving the patient developed pain in his right arm from his shoulder down. He says that it felt like a flash of pain followed by inability to grasp with his right hand.  He had difficulty grasping items or writing.  He denies having had any numbness.  He denies having had any other neurologic issues, no difficulty with his right leg, no difficulty with speech or asymmetric facial movement.  He has been putting a heating pad and topical deep heat cream on his arm.  His arm pain has improved and his movement is better however still mildly weak.  He is able to write his name today but his daughter says it is not quite his normal.  Patient states that he also had some lower leg edema which improved with Lasix.  He has no edema at all on exam.  Currently he denies any chest discomfort, significant shortness of breath, lightheadedness, syncope, orthopnea, palpitations or edema.  The patient and his daughter were concerned about stroke or possible blood clot.  EKG done today looks like intermittent sinus rhythm with some type of AV block. Review of prior EKG also does not look like afib, but possible intermittent block or junctional escape rhythm.  Cardiac studies   Echocardiogram 07/19/2019 IMPRESSIONS 1. Left ventricular ejection fraction, by visual estimation, is 50 to 55%. The left ventricle has normal function. There is no left ventricular hypertrophy. 2. Left ventricular diastolic Doppler parameters are consistent with impaired relaxation pattern  of LV diastolic filling. 3. Global right ventricle has normal systolic function.The right ventricular size is normal. No increase in right ventricular wall thickness. 4. Left atrial size was mildly dilated. 5. Right atrial size was normal. 6. Moderate calcification of the mitral valve leaflet(s). 7. Moderate thickening of the mitral valve leaflet(s). 8. Severe aortic valve annular calcification. 9. The  mitral valve is abnormal. Trace mitral valve regurgitation. 10. The tricuspid valve is grossly normal. Tricuspid valve regurgitation is mild. 11. The aortic valve has an indeterminant number of cusps Aortic valve regurgitation was not visualized by color flow Doppler. 12. The aortic valve is heavily calcified. The leaflet mobility appears to be reduced but there was not a significant AV gradient detected. 13. The pulmonic valve was grossly normal. Pulmonic valve regurgitation is not visualized by color flow Doppler. 14. Mildly elevated pulmonary artery systolic pressure. 15. The atrial septum is grossly normal.  Echocardiogram 04/10/2018 Left ventricle: The cavity size was normal. Systolic function was normal. The estimated ejection fraction was in the range of 60% to 65%. Wall motion was normal; there were no regional wall motion abnormalities. Doppler parameters are consistent with abnormal left ventricular relaxation (grade 1 diastolic dysfunction). Doppler parameters are consistent with high ventricular filling pressure. - Aortic valve: Valve mobility was restricted. Transvalvular velocity was within the normal range. There was no stenosis. There was mild regurgitation. Valve area (VTI): 2.22 cm^2. Valve area (Vmax): 2.71 cm^2. Valve area (Vmean): 2.43 cm^2. - Mitral valve: Calcified annulus. Transvalvular velocity was within the normal range. There was no evidence for stenosis. There was trivial regurgitation. Valve area by continuity equation (using LVOT flow): 2.45 cm^2. - Left atrium: The atrium was severely dilated. - Right ventricle: The cavity size was normal. Wall thickness was normal. Systolic function was normal. - Right atrium: The atrium was mildly dilated. - Tricuspid valve: There was no regurgitation.  Coronary Stent Intervention  Left Heart Cath and Cors/Grafts Angiography 05/23/2015  Conclusion  Mid LAD lesion, 100% stenosed.  Ost Cx  lesion, 100% stenosed.  LM lesion, 95% stenosed.  Prox RCA lesion, 50% stenosed.  Post Atrio lesion, 100% stenosed.  Dist RCA lesion, 50% stenosed.  SVG was injected is normal in caliber, and is anatomically normal.  SVG was injected is normal in caliber, and is anatomically normal.  was injected is normal in caliber.  There is severe disease in the graft.  Dist Graft to Insertion lesion, 99% stenosed. There is a 0% residual stenosis post intervention.  A drug-eluting stent was placed.  The left ventricular systolic function is normal.   Low normal global LV function with an ejection fraction of 50-55%.  Significant native CAD with 95% distal left main stenosis, proximal occlusion of the LAD after the first 2 septal perforating arteries; total occlusion of the circumflex at the ostium; 50% proximal RCA stenosis, 50% mid stenosis and total occlusion of the distal RCA after a small PDA-like vessel.  Patent LIMA graft supplying the mid LAD.  SVG supplying the circumflex marginal vessel with 99% distal anastomosis stenosis.  Patent SVG supplying the diagonal vessel.  Patent SVG apply the distal RCA with evidence for collateralization of the AV groove circumflex from the PLA vessel.  Successful PCI to the SVG to obtuse marginal vessel, treated with PTCA, Synergy DES stenting with a 3.016 mm stent postdilated to 3.43 mm in the very distal portion of the graft to 3.23 mm in the native marginal vessel with the stenosis being reduced to 0% and  brisk 3 TIMI-3 flow.  RECOMMENDATION: Continued DAPT therapy and medical therapy for concomitant native CAD.     Past Medical History:  Diagnosis Date  . Anginal pain ([place])   . Arthritis   . Coronary artery disease   . Diabetes mellitus   . GERD (gastroesophageal reflux disease)   . History of cardiovascular stress test    Lexiscan Myoview 7/16:  EF 51%, inferior, inferolateral ischemia; Intermediate Risk  . Hypertension   .  Ischemic heart disease    a. s/p CABG;  b.  LHC 05/23/15:  S-RPAVB ok, S-D2 ok, S-OM2 99% (s/p Synergy DES), L-LAD ok, EF normal (complicated by pseudoaneurysm)   . Shortness of breath dyspnea   . Smallpox     Past Surgical History:  Procedure Laterality Date  . APPENDECTOMY    . [place]   normal  ([place])  . CARDIAC CATHETERIZATION N/A 05/23/2015   Procedure: Left Heart Cath and Cors/Grafts Angiography;  Surgeon: Troy Sine, MD;  Location: [facility];  Service: Cardiovascular;  Laterality: N/A;  . CARDIAC CATHETERIZATION N/A 05/23/2015   Procedure: Coronary Stent Intervention;  Surgeon: Troy Sine, MD;  Location: [facility];  Service: Cardiovascular;  Laterality: N/A;  . CORONARY ARTERY BYPASS GRAFT  12/2003  . DOPPLER ECHOCARDIOGRAPHY  07/15/10   EF 40-45%, aortic stenosis  . FOOT SURGERY Right   . HERNIA REPAIR Bilateral 1979  . PERCUTANEOUS CORONARY STENT INTERVENTION (PCI-S)  05/23/2015   des    ptca svg    Current Medications: Current Meds  Medication Sig  . albuterol (PROVENTIL HFA;VENTOLIN HFA) 108 (90 Base) MCG/ACT inhaler Inhale 2 puffs into the lungs every 4 (four) hours as needed for wheezing or shortness of breath.  [name] amLODipine (NORVASC) 2.5 MG tablet Take 1 tablet by mouth once daily  . aspirin EC 81 MG tablet Take 81 mg by mouth daily.  . clopidogrel (PLAVIX) 75 MG tablet Take 1 tablet (75 mg total) by mouth daily.  [name] docusate sodium (COLACE) 100 MG capsule Take 100 mg by mouth at bedtime.  . finasteride (PROSCAR) 5 MG tablet Take 5 mg by mouth at bedtime.  . furosemide (LASIX) 20 MG tablet Take 1 tablet (20 mg total) by mouth every other day.  . isosorbide mononitrate (IMDUR) 30 MG 24 hr tablet Take 1 tablet (30 mg total) by mouth daily.  . mirabegron ER (MYRBETRIQ) 50 MG TB24 tablet Take 50 mg by mouth daily.  . Multiple Vitamin (MULTIVITAMIN WITH MINERALS) TABS tablet Take 1 tablet by mouth daily.  . Multiple  Vitamins-Minerals (PRESERVISION AREDS 2 PO) Take 1 tablet by mouth 2 (two) times daily.   [name] NITROSTAT 0.4 MG SL tablet PLACE 1 TABLET UNDER THE TONGUE EVERY 5 MINUTES AS NEEDED FOR CHEST PAIN  . ONE TOUCH ULTRA TEST test strip 1 each by Other route as needed (glucose monoring).   . pantoprazole (PROTONIX) 40 MG tablet Take 1 tablet (40 mg total) by mouth daily.  . pravastatin (PRAVACHOL) 80 MG tablet Take 80 mg by mouth at bedtime.   . terbinafine (LAMISIL) 250 MG tablet Take 250 mg by mouth daily.   Current Facility-Administered Medications for the 09/29/19 encounter (Office Visit) with Daune Perch, NP  Medication  . SOLUTION-PLUS STOPCOCK MISC     Allergies:   Sulfa antibiotics, Pork-derived products, and Aceon [perindopril erbumine]   Social History   Socioeconomic History  . Marital status: Married    Spouse name: Not on  file  . Number of children: 2 d  . Years of education: Not on file  . Highest education level: Not on file  Occupational History  . Occupation: Optometrist    Comment: [employer]  Social Needs  . Financial resource strain: Not on file  . Food insecurity    Worry: Not on file    Inability: Not on file  . Transportation needs    Medical: Not on file    Non-medical: Not on file  Tobacco Use  . Smoking status: Never Smoker  . Smokeless tobacco: Never Used  Substance and Sexual Activity  . Alcohol use: No    Alcohol/week: 0.0 standard drinks  . Drug use: No  . Sexual activity: Not on file  Lifestyle  . Physical activity    Days per week: Not on file    Minutes per session: Not on file  . Stress: Not on file  Relationships  . Social Herbalist on phone: Not on file    Gets together: Not on file    Attends religious service: Not on file    Active member of club or organization: Not on file    Attends meetings of clubs or organizations: Not on file    Relationship status: Not on file  Other Topics Concern  . Not on file  Social History  Narrative  . Not on file     Family History: The patient's family history includes Healthy in his father; Stroke in his mother; Ulcers in his mother. ROS:   Please see the history of present illness.     All other systems reviewed and are negative.   EKG:  EKG is ordered today.  The ekg ordered today demonstrates sinus rhythm with 1st degree AV block, LBBB and intermittent irregular rhythm with loss of p waves, possible junctional escape rhythm.   Recent Labs: 02/24/2019: ALT 13; Hemoglobin 10.2; Platelets 226 07/17/2019: BUN 28; Creatinine, Ser 0.74; Potassium 4.7; Sodium 136; TSH 0.955   Recent Lipid Panel    Component Value Date/Time   CHOL 126 02/24/2019 1058   TRIG 93 02/24/2019 1058   HDL 55 02/24/2019 1058   CHOLHDL 2.3 02/24/2019 1058   CHOLHDL 2.2 07/20/2017 0949   VLDL 10 09/07/2016 1027   LDLCALC 52 02/24/2019 1058   LDLCALC 45 07/20/2017 0949    Physical Exam:    VS:  BP 102/60   Pulse 66   Ht 5\' 4"  (1.626 m)   Wt 110 lb 12.8 oz (50.3 kg)   SpO2 96%   BMI 19.02 kg/m     Wt Readings from Last 6 Encounters:  09/29/19 110 lb 12.8 oz (50.3 kg)  08/07/19 113 lb (51.3 kg)  07/17/19 110 lb (49.9 kg)  02/23/19 122 lb (55.3 kg)  08/01/18 123 lb (55.8 kg)  04/21/18 121 lb 9.6 oz (55.2 kg)     Physical Exam  Constitutional: He is oriented to person, place, and time. No distress.  Thin, Frail elderly male  HENT:  Head: Normocephalic and atraumatic.  Neck: Normal range of motion. Neck supple. No JVD present.  Cardiovascular: Normal rate and regular rhythm.  Murmur heard.  Harsh systolic murmur is present with a grade of 2/6 at the upper left sternal border. Pulmonary/Chest: Effort normal and breath sounds normal. No respiratory distress. He has no wheezes. He has no rales.  Abdominal: Soft. Bowel sounds are normal.  Musculoskeletal: Normal range of motion.        General: No  edema.  Neurological: He is alert and oriented to person, place, and time.  Skin:  Skin is warm and dry.  Psychiatric: He has a normal mood and affect. His behavior is normal. Judgment and thought content normal.  Vitals reviewed.   ASSESSMENT:    1. Cardiac arrhythmia, unspecified cardiac arrhythmia type   2. Right arm weakness   3. Chronic combined systolic and diastolic congestive heart failure ([place])   4. Coronary artery disease involving native coronary artery of native heart without angina pectoris   5. Essential (primary) hypertension   6. Hyperlipidemia, unspecified hyperlipidemia type    PLAN:    In order of problems listed above:  Right arm weakness -Began with pain with associated weakness of grip. No other neurologic deficits. Will check CT of the head to rule out stroke. May be a nerve issue. Pt has had R shoulder issues in the past. He will also see his PCP for further evaluation.   Chronic combined systolic and diastolic heart failure -Patient is no longer on an ARB.  He is on Lasix 20 mg daily and isosorbide mononitrate. -He noted LE edema at the time a couple of weeks ago, but now resolved with lasix. No dyspnea or orthopnea. No current edema.   Arrhythmia -EKG is indeterminate. On review of rhythm strip, appears to be intermittently sinus rhythm with also some AV block/junctional escape rhythm. It is irregular at times but does not look like afib or flutter. I will order a 14 day live Zio monitor and refer pt to EP for further evaluation.   CAD -Status post prior CABG with last stent to SVG in 2016 with other grafts patent.  -Patient continues on aspirin 81 mg, Plavix 75 mg, Imdur 30 mg, statin, low-dose amlodipine. -No anginal symptoms.   Hypertension -BP well controlled  Hyperlipidemia -On pravastatin 80 mg daily -02/24/2019: Cholesterol, Total 126; HDL 55; LDL Calculated 52; Triglycerides 93.  At goal.  Continue current statin  Medication Adjustments/Labs and Tests Ordered: Current medicines are reviewed at length with the patient today.   Concerns regarding medicines are outlined above. Labs and tests ordered and medication changes are outlined in the patient instructions below:  Patient Instructions  Medication Instructions:  Your physician recommends that you continue on your current medications as directed. Please refer to the Current Medication list given to you today.  *If you need a refill on your cardiac medications before your next appointment, please call your pharmacy*  Lab Work: None   If you have labs (blood work) drawn today and your tests are completely normal, you will receive your results only by: [name] MyChart Message (if you have MyChart) OR . A paper copy in the mail If you have any lab test that is abnormal or we need to change your treatment, we will call you to review the results.  Testing/Procedures: Your physcian has ordered for you to have a CT of your head on 10/02/2019 @ 9:00 AM-- Please arrive 15 mins early   A zio monitor order was placed today. It will remain on for 14 days. You will then return monitor and event diary in provided box. It takes 1-2 weeks for report to be downloaded and returned to [place]. We will call you with the results. If monitor falls off or has orange flashing light, please call Zio for further instructions.  You have been referred to see a Electrophysiologist for your irregular rhythm     Follow-Up: At [facility], you and your  health needs are our priority.  As part of our continuing mission to provide you with exceptional heart care, we have created designated Provider Care Teams.  These Care Teams include your primary Cardiologist (physician) and Advanced Practice Providers (APPs -  Physician Assistants and Nurse Practitioners) who all work together to provide you with the care you need, when you need it.  Your next appointment:   3 week(s)  The format for your next appointment:   In Person  Provider:   You may see or one of the following Advanced Practice Providers  on your designated Care Team:    Chanetta Marshall, NP  Tommye Standard, PA-C  Legrand Como "Oda [place]ts, [place]   Other Instructions None     Signed, Daune Perch, NP  09/29/2019 5:26 PM    [place]

## 2019-09-27 NOTE — Telephone Encounter (Signed)
Spoke with pt's daughter and notes swelling to lower legs this is somewhat better since taking "fluid pill" Also daughter states since Wednesday day before thanksgiving has noted right arm weakness no swelling noted speech is fine and is able to ambulate without difficulty refuses to go to ED at this time Pt's daughter did try and take pt to urgent care on Friday but the wait was to long and was fearful of dad contracting COVID as he is 85 years old .Daughter requesting appt. Appt made with Pecolia Ades NP for Friday at 10:30 am .

## 2019-09-29 ENCOUNTER — Ambulatory Visit (INDEPENDENT_AMBULATORY_CARE_PROVIDER_SITE_OTHER): Payer: BC Managed Care – PPO | Admitting: Cardiology

## 2019-09-29 ENCOUNTER — Telehealth: Payer: Self-pay | Admitting: Radiology

## 2019-09-29 ENCOUNTER — Encounter: Payer: Self-pay | Admitting: Cardiology

## 2019-09-29 ENCOUNTER — Other Ambulatory Visit: Payer: Self-pay

## 2019-09-29 VITALS — BP 102/60 | HR 66 | Ht 64.0 in | Wt 110.8 lb

## 2019-09-29 DIAGNOSIS — I10 Essential (primary) hypertension: Secondary | ICD-10-CM

## 2019-09-29 DIAGNOSIS — R29898 Other symptoms and signs involving the musculoskeletal system: Secondary | ICD-10-CM

## 2019-09-29 DIAGNOSIS — I499 Cardiac arrhythmia, unspecified: Secondary | ICD-10-CM | POA: Diagnosis not present

## 2019-09-29 DIAGNOSIS — I5042 Chronic combined systolic (congestive) and diastolic (congestive) heart failure: Secondary | ICD-10-CM

## 2019-09-29 DIAGNOSIS — I2510 Atherosclerotic heart disease of native coronary artery without angina pectoris: Secondary | ICD-10-CM

## 2019-09-29 DIAGNOSIS — E785 Hyperlipidemia, unspecified: Secondary | ICD-10-CM

## 2019-09-29 NOTE — Patient Instructions (Addendum)
Medication Instructions:  Your physician recommends that you continue on your current medications as directed. Please refer to the Current Medication list given to you today.  *If you need a refill on your cardiac medications before your next appointment, please call your pharmacy*  Lab Work: None   If you have labs (blood work) drawn today and your tests are completely normal, you will receive your results only by: [name] MyChart Message (if you have MyChart) OR . A paper copy in the mail If you have any lab test that is abnormal or we need to change your treatment, we will call you to review the results.  Testing/Procedures: Your physcian has ordered for you to have a CT of your head on 10/02/2019 @ 9:00 AM-- Please arrive 15 mins early   A zio monitor order was placed today. It will remain on for 14 days. You will then return monitor and event diary in provided box. It takes 1-2 weeks for report to be downloaded and returned to [place]. We will call you with the results. If monitor falls off or has orange flashing light, please call Zio for further instructions.  You have been referred to see a Electrophysiologist for your irregular rhythm     Follow-Up: At [facility], you and your health needs are our priority.  As part of our continuing mission to provide you with exceptional heart care, we have created designated Provider Care Teams.  These Care Teams include your primary Cardiologist (physician) and Advanced Practice Providers (APPs -  Physician Assistants and Nurse Practitioners) who all work together to provide you with the care you need, when you need it.  Your next appointment:   3 week(s)  The format for your next appointment:   In Person  Provider:   You may see or one of the following Advanced Practice Providers on your designated Care Team:    Chanetta Marshall, NP  Tommye Standard, PA-C  Legrand Como "Jonni Sanger" [place], [place]   Other Instructions None

## 2019-09-29 NOTE — Telephone Encounter (Signed)
Enrolled patient for a 14 day Zio monitor to be mailed to patients home.  

## 2019-10-02 ENCOUNTER — Inpatient Hospital Stay: Admission: RE | Admit: 2019-10-02 | Payer: BC Managed Care – PPO | Source: Ambulatory Visit

## 2019-10-03 ENCOUNTER — Other Ambulatory Visit: Payer: Self-pay

## 2019-10-03 ENCOUNTER — Ambulatory Visit (INDEPENDENT_AMBULATORY_CARE_PROVIDER_SITE_OTHER)
Admission: RE | Admit: 2019-10-03 | Discharge: 2019-10-03 | Disposition: A | Discharge status: Home / Self Care | Payer: BC Managed Care – PPO | Source: Ambulatory Visit | Attending: Cardiology | Admitting: Cardiology

## 2019-10-03 DIAGNOSIS — R29898 Other symptoms and signs involving the musculoskeletal system: Secondary | ICD-10-CM

## 2019-11-02 ENCOUNTER — Institutional Professional Consult (permissible substitution): Payer: BC Managed Care – PPO | Admitting: Internal Medicine

## 2019-11-09 NOTE — Progress Notes (Addendum)
Reviewed pt chart for pre-op interview.  Noted pt had extension cardiac history.  Reviewed pt's cardiology notes in epic.  And clearance was not given by pt's cardiologist Dr [name], noted dated 07-17-2019.  Chart to be reviewed by anesthesia, Konrad Felix PA.  ADDENDUM:  Per anesthesia, Konrad Felix PA, pt will need cardiac clearance prior to surgery and then determine if the results from [facility] monitor is needed. Called and left voice message with Cipriano Mile, OR scheduler @ [facility],  That pt needed cardiac clearance prior to having procedure.   ADDENDUM:  No cardiac clearance has been requested from [facility] to Dr [name] office, pt's cardiologist, in epic.  Called and spoke w/ Eyvonne Mechanic, triage nurse for Dr Kieth Brightly, informed of this since pt is due to covid test this afternoon, which pt would not need this week if not cleared for surgery.    ADDENDUM:   Have not received cardiac clearance for pt of today.  And noted in epic that has not been a request made to pt's cardiologist.  Hulen Skains and spoke w/ Abigail Butts, triage nurse for Dr Kieth Brightly office, informed her no clearance , which is needed for surgey.  Abigail Butts stated she will check into it and let Dr Kieth Brightly awar.

## 2019-11-13 ENCOUNTER — Other Ambulatory Visit (HOSPITAL_COMMUNITY): Payer: BC Managed Care – PPO

## 2019-11-16 ENCOUNTER — Other Ambulatory Visit (HOSPITAL_COMMUNITY)
Admission: RE | Admit: 2019-11-16 | Discharge: 2019-11-16 | Disposition: A | Discharge status: Home / Self Care | Payer: BC Managed Care – PPO | Source: Ambulatory Visit | Attending: General Surgery | Admitting: General Surgery

## 2019-11-16 DIAGNOSIS — Z01812 Encounter for preprocedural laboratory examination: Secondary | ICD-10-CM | POA: Diagnosis present

## 2019-11-16 DIAGNOSIS — Z20822 Contact with and (suspected) exposure to covid-19: Secondary | ICD-10-CM | POA: Insufficient documentation

## 2019-11-16 LAB — SARS CORONAVIRUS 2 (TAT 6-24 HRS): SARS Coronavirus 2: NEGATIVE

## 2019-11-17 ENCOUNTER — Other Ambulatory Visit: Payer: Self-pay

## 2019-11-17 ENCOUNTER — Telehealth: Payer: Self-pay | Admitting: Cardiovascular Disease

## 2019-11-17 ENCOUNTER — Encounter (HOSPITAL_BASED_OUTPATIENT_CLINIC_OR_DEPARTMENT_OTHER): Payer: Self-pay | Admitting: General Surgery

## 2019-11-17 NOTE — Telephone Encounter (Signed)
   Primary Cardiologist: [name], MD  Chart reviewed as part of pre-operative protocol coverage. I called and spoke to the patient's daughter on 11/17/2019 in reference to pre-operative risk assessment for pending surgery as outlined below.  Dakota [name] was last seen on 09/29/2019 by me.  Since that day, Dakota [name] has done well.  His arm/hand is doing better. He has had no chest pain, shortness of breath, lightheadedness or syncope.   Therefore, based on ACC/AHA guidelines, the patient would be at acceptable risk for the planned low risk procedure without further cardiovascular testing.   He was already told to hold aspirin and Plavix and this was stopped 2 days ago in anticipation of surgery in 3 days.   Please resume aspirin and Plavix as soon as OK with the surgeon. (I advised his daughter of this)  I will route this recommendation to the requesting party via [facility] fax function and remove from pre-op pool.  Please call with questions.  Daune Perch, NP 11/17/2019, 11:31 AM

## 2019-11-17 NOTE — Progress Notes (Addendum)
CHART REVIEWED BY ANESTHESIA, JESSICA ZANETTO PA,  PROCEED [facility] ACUTE STATUS CHANGE DOS, REFER TO PROGRESS NOTE   Spoke w/ via phone for pre-op interview--- Pt's daughter,Nadeema Siqueira, since pt is very [employer]----  Istat 8             Lab results------ current ekg in chart/ epic COVID test ------ 11-16-2019  Arrive at ------- 28 NPO after ------ MN w/ exception clear liquids until 0830 then nothing by mouth (no cream/ milk products) Medications to take morning of surgery ----- Imdur, Norvasc, Protonix w/ sips of water  Diabetic medication ----- pt is diet controlled, no meds Patient Special Instructions ----- asked to bring pt's rescue inhaler dos  Pre-Op special Istructions ----- pt telephone clearance dated 11-17-2019 in chart/ epic, Dr Corky Downs.  Pt daughter will need to be with pt in pre-op pt is very [facility] and gets confused.  Patient daughter verbalized understanding of instructions that were given at this phone interview. Patient daughter stated pt not having ususal shortness of breath, chest pain, fever, cough at this phone interview.   Anesthesia Review: CHF chronic diastolic and systolic,  CAD s/p CABG x4 03/ 2005 and s/p PCI w/ DES to SVG--OM graft 07/ 2016, COPD moderate, DM2, HTN, first degree heart block w/ LBBB, severe AV sclerosis without significant stenosis, moderate MV calcification without stenosis  PCP:  Dt [facility] Cardiologist : Dr Corky Downs (lov w/ PA 09-29-2019 epic) Chest x-ray : 04-09-2018 epic EKG : 09-29-2019 epic Echo : 07-19-2019 epic Stress test:  05-15-2015 epic Cardiac Cath :  Last one 05-23-2015 epic Sleep Study/ CPAP : NO Fasting Blood Sugar :      / Checks Blood Sugar -- times a day:   Pt diet controlled ,  Does not check sugar's regularly Blood Thinner/ Instructions Maryjane Hurter Dose: Plavix/ pt given clearance to stop by cardiology/ last dose 11-13-2019 ASA / Instructions/ Last Dose :  ASA 81mg / pt given clearance to stop by cardiology/   Last dose 11-13-2019

## 2019-11-17 NOTE — Progress Notes (Signed)
Anesthesia Chart Review   Case: [id number] Date/Time: 11/20/19 1417   Procedure: EXCISION OF SEBACEOUS CYST CHEST (N/A )   Anesthesia type: Choice   Pre-op diagnosis: SEBACEOUS CYST CHEST   Location: [place] OR ROOM .5 / [place]   Surgeons: Kinsinger, Arta Bruce, MD      DISCUSSION:86 y.o. never smoker with h/o DM II, HTN, GERD, LBBB, combined systolic and diastolic heart failure, CAD s/p CABG 2005,S/P DES to OM graft 2016, essential hypertension, moderate COPD, sebaceous cyst on chest scheduled for above procedure 11/20/19 with Dr. Gurney Maxin.   Pt seen by cardiologist, Dr. [name], 07/17/2019.  Per OV note, "With his apparent PAF on antiplatelet therapy at present I have recommended he defer any consideration of excision of his small cystic nodule from his chest wall.  I will see him back in the office in 3 to 4 weeks."  Last follow up with cardiology 09/29/2019.  At this visit pt complained of right arm weakness, a CT head ordered to rule out stroke.  Zio monitor ordered and a referral placed for EP to evaluate EKD with intermittent sinus rhythm with some AV block/junctional escape rhythm.   Cardiac clearance requested and pending.    Addendum 11/17/19 1:25pm:  Cleared by cardiology.  Per Daune Perch, NP, "Chart reviewed as part of pre-operative protocol coverage. I called and spoke to the patient's daughter on 11/17/2019 in reference to pre-operative risk assessment for pending surgery as outlined below.  ARKIN HEMSWORTH was last seen on 09/29/2019 by me.  Since that day, MURRIEL BECKEN has done well.  His arm/hand is doing better. He has had no chest pain, shortness of breath, lightheadedness or syncope.  Therefore, based on ACC/AHA guidelines, the patient would be at acceptable risk for the planned low risk procedure without further cardiovascular testing.  He was already told to hold aspirin and Plavix and this was stopped 2 days ago in anticipation of surgery in 3 days.   Please resume aspirin and Plavix as soon as OK with the surgeon. (I advised his daughter of this)"  Anticipate pt can proceed with planned procedure barring acute status change and after evaluation by anesthesia DOS.  VS: There were no vitals taken for this visit.  PROVIDERS: Tisovec, Fransico Him, MD is PCP   [name], MD is Cardiologist  LABS: labs DOS (all labs ordered are listed, but only abnormal results are displayed)  Labs Reviewed - No data to display   IMAGES:   EKG: 09/29/2019 Rate 66 bpm Sinus rhythm with 1st degree AV block with premature atrial complexes Left bundle branch block   CV: Echo 07/19/2019 IMPRESSIONS    1. Left ventricular ejection fraction, by visual estimation, is 50 to 55%. The left ventricle has normal function. There is no left ventricular hypertrophy.  2. Left ventricular diastolic Doppler parameters are consistent with impaired relaxation pattern of LV diastolic filling.  3. Global right ventricle has normal systolic function.The right ventricular size is normal. No increase in right ventricular wall thickness.  4. Left atrial size was mildly dilated.  5. Right atrial size was normal.  6. Moderate calcification of the mitral valve leaflet(s).  7. Moderate thickening of the mitral valve leaflet(s).  8. Severe aortic valve annular calcification.  9. The mitral valve is abnormal. Trace mitral valve regurgitation. 10. The tricuspid valve is grossly normal. Tricuspid valve regurgitation is mild. 11. The aortic valve has an indeterminant number of cusps Aortic valve regurgitation was not  visualized by color flow Doppler. 12. The aortic valve is heavily calcified. The leaflet mobility appears to be reduced but there was not a significant AV gradient detected. 13. The pulmonic valve was grossly normal. Pulmonic valve regurgitation is not visualized by color flow Doppler. 14. Mildly elevated pulmonary artery systolic pressure. 15. The atrial  septum is grossly normal.  Cardiac Cath 05/23/2015  Mid LAD lesion, 100% stenosed.  Ost Cx lesion, 100% stenosed.  LM lesion, 95% stenosed.  Prox RCA lesion, 50% stenosed.  Post Atrio lesion, 100% stenosed.  Dist RCA lesion, 50% stenosed.  SVG was injected is normal in caliber, and is anatomically normal.  SVG was injected is normal in caliber, and is anatomically normal.  was injected is normal in caliber.  There is severe disease in the graft.  Dist Graft to Insertion lesion, 99% stenosed. There is a 0% residual stenosis post intervention.  A drug-eluting stent was placed.  The left ventricular systolic function is normal.   Low normal global LV function with an ejection fraction of 50-55%.  Significant native CAD with 95% distal left main stenosis, proximal occlusion of the LAD after the first 2 septal perforating arteries; total occlusion of the circumflex at the ostium; 50% proximal RCA stenosis, 50% mid stenosis and total occlusion of the distal RCA after a small PDA-like vessel.  Patent LIMA graft supplying the mid LAD.  SVG supplying the circumflex marginal vessel with 99% distal anastomosis stenosis.  Patent SVG supplying the diagonal vessel.  Patent SVG apply the distal RCA with evidence for collateralization of the AV groove circumflex from the PLA vessel.  Successful PCI to the SVG to obtuse marginal vessel, treated with PTCA, Synergy DES stenting with a 3.016 mm stent postdilated to 3.43 mm in the very distal portion of the graft to 3.23 mm in the native marginal vessel with the stenosis being reduced to 0% and brisk 3 TIMI-3 flow.  RECOMMENDATION:  Continued DAPT therapy and medical therapy for concomitant native CAD.  Myocardial Perfusion 05/15/2015  The left ventricular ejection fraction is mildly decreased (45-54%).  Nuclear stress EF: 51%.  Defect 1: There is a medium defect of moderate severity present in the basal inferior, basal  inferolateral, mid inferior and mid inferolateral location.   Intermediate risk study with moderate inferolateral wall ischemia. The patient will follow up with Richardson Dopp PA-C. Further evaluation may be warrented. Past Medical History:  Diagnosis Date  . Anginal pain ([place])   . Arthritis   . Coronary artery disease   . Diabetes mellitus   . GERD (gastroesophageal reflux disease)   . History of cardiovascular stress test    Lexiscan Myoview 7/16:  EF 51%, inferior, inferolateral ischemia; Intermediate Risk  . Hypertension   . Ischemic heart disease    a. s/p CABG;  b.  LHC 05/23/15:  S-RPAVB ok, S-D2 ok, S-OM2 99% (s/p Synergy DES), L-LAD ok, EF normal (complicated by pseudoaneurysm)   . Shortness of breath dyspnea   . Smallpox     Past Surgical History:  Procedure Laterality Date  . APPENDECTOMY    . [place]   normal  ([place])  . CARDIAC CATHETERIZATION N/A 05/23/2015   Procedure: Left Heart Cath and Cors/Grafts Angiography;  Surgeon: Troy Sine, MD;  Location: [place] [facility];  Service: Cardiovascular;  Laterality: N/A;  . CARDIAC CATHETERIZATION N/A 05/23/2015   Procedure: Coronary Stent Intervention;  Surgeon: Troy Sine, MD;  Location: [place] [facility];  Service:  Cardiovascular;  Laterality: N/A;  . CORONARY ARTERY BYPASS GRAFT  12/2003  . DOPPLER ECHOCARDIOGRAPHY  07/15/10   EF 40-45%, aortic stenosis  . FOOT SURGERY Right   . HERNIA REPAIR Bilateral 1979  . PERCUTANEOUS CORONARY STENT INTERVENTION (PCI-S)  05/23/2015   des    ptca svg    MEDICATIONS: . SOLUTION-PLUS STOPCOCK MISC   . albuterol (PROVENTIL HFA;VENTOLIN HFA) 108 (90 Base) MCG/ACT inhaler  . amLODipine (NORVASC) 2.5 MG tablet  . aspirin EC 81 MG tablet  . clopidogrel (PLAVIX) 75 MG tablet  . docusate sodium (COLACE) 100 MG capsule  . finasteride (PROSCAR) 5 MG tablet  . furosemide (LASIX) 20 MG tablet  . isosorbide mononitrate (IMDUR) 30 MG 24 hr tablet  . mirabegron  ER (MYRBETRIQ) 50 MG TB24 tablet  . Multiple Vitamin (MULTIVITAMIN WITH MINERALS) TABS tablet  . Multiple Vitamins-Minerals (PRESERVISION AREDS 2 PO)  . NITROSTAT 0.4 MG SL tablet  . ONE TOUCH ULTRA TEST test strip  . pantoprazole (PROTONIX) 40 MG tablet  . pravastatin (PRAVACHOL) 80 MG tablet  . terbinafine (LAMISIL) 250 MG tablet     Maia Plan [facility] Pre-Surgical Testing [phone] 11/17/19  10:43 AM

## 2019-11-17 NOTE — Telephone Encounter (Signed)
Dr. [name], this pt has hx of CABG 2005 and DES to OM graft 2016. He needs excision of sebaceous cyst on chest and they are requesting to hold all thinners. It think perhaps OK to hold Plavix but recommend continuing aspirin.   What is your recommendation?  Please route response back to P CV DIV PREOP  Thanks, Gae Bon

## 2019-11-17 NOTE — Telephone Encounter (Signed)
   [facility] Pre-operative Risk Assessment    Request for surgical clearance:  1. What type of surgery is being performed? Excision of Sebaceous Cyst - Chest  2. When is this surgery scheduled? 11/20/19  3. What type of clearance is required (medical clearance vs. Pharmacy clearance to hold med vs. Both)? Medical  4. Are there any medications that need to be held prior to surgery and how long? Plavix, any other blood thinners  5. Practice name and name of physician performing surgery? Luke Kinsinger   6. What is your office phone number [phone]   7.   What is your office fax number? [phone]  8.   Anesthesia type (None, local, MAC, general) ? MAC   Dakota [name] 11/17/2019, 10:11 AM  _________________________________________________________________   (provider comments below)

## 2019-11-20 ENCOUNTER — Other Ambulatory Visit: Payer: Self-pay

## 2019-11-20 ENCOUNTER — Ambulatory Visit (HOSPITAL_BASED_OUTPATIENT_CLINIC_OR_DEPARTMENT_OTHER): Payer: BC Managed Care – PPO | Admitting: Physician Assistant

## 2019-11-20 ENCOUNTER — Ambulatory Visit (HOSPITAL_BASED_OUTPATIENT_CLINIC_OR_DEPARTMENT_OTHER)
Admission: RE | Admit: 2019-11-20 | Discharge: 2019-11-20 | Disposition: A | Discharge status: Home / Self Care | Payer: BC Managed Care – PPO | Attending: General Surgery | Admitting: General Surgery

## 2019-11-20 ENCOUNTER — Encounter (HOSPITAL_BASED_OUTPATIENT_CLINIC_OR_DEPARTMENT_OTHER)
Admission: RE | Disposition: A | Discharge status: Home / Self Care | Payer: Self-pay | Source: Home / Self Care | Attending: General Surgery

## 2019-11-20 ENCOUNTER — Encounter (HOSPITAL_BASED_OUTPATIENT_CLINIC_OR_DEPARTMENT_OTHER): Payer: Self-pay | Admitting: General Surgery

## 2019-11-20 DIAGNOSIS — M1990 Unspecified osteoarthritis, unspecified site: Secondary | ICD-10-CM | POA: Insufficient documentation

## 2019-11-20 DIAGNOSIS — I110 Hypertensive heart disease with heart failure: Secondary | ICD-10-CM | POA: Insufficient documentation

## 2019-11-20 DIAGNOSIS — Z79899 Other long term (current) drug therapy: Secondary | ICD-10-CM | POA: Diagnosis not present

## 2019-11-20 DIAGNOSIS — Z955 Presence of coronary angioplasty implant and graft: Secondary | ICD-10-CM | POA: Insufficient documentation

## 2019-11-20 DIAGNOSIS — Z7982 Long term (current) use of aspirin: Secondary | ICD-10-CM | POA: Diagnosis not present

## 2019-11-20 DIAGNOSIS — J449 Chronic obstructive pulmonary disease, unspecified: Secondary | ICD-10-CM | POA: Insufficient documentation

## 2019-11-20 DIAGNOSIS — D1801 Hemangioma of skin and subcutaneous tissue: Secondary | ICD-10-CM | POA: Insufficient documentation

## 2019-11-20 DIAGNOSIS — I071 Rheumatic tricuspid insufficiency: Secondary | ICD-10-CM | POA: Insufficient documentation

## 2019-11-20 DIAGNOSIS — Z951 Presence of aortocoronary bypass graft: Secondary | ICD-10-CM | POA: Insufficient documentation

## 2019-11-20 DIAGNOSIS — I700 Atherosclerosis of aorta: Secondary | ICD-10-CM | POA: Insufficient documentation

## 2019-11-20 DIAGNOSIS — I2510 Atherosclerotic heart disease of native coronary artery without angina pectoris: Secondary | ICD-10-CM | POA: Insufficient documentation

## 2019-11-20 DIAGNOSIS — I5042 Chronic combined systolic (congestive) and diastolic (congestive) heart failure: Secondary | ICD-10-CM | POA: Insufficient documentation

## 2019-11-20 DIAGNOSIS — K219 Gastro-esophageal reflux disease without esophagitis: Secondary | ICD-10-CM | POA: Insufficient documentation

## 2019-11-20 DIAGNOSIS — Z7902 Long term (current) use of antithrombotics/antiplatelets: Secondary | ICD-10-CM | POA: Diagnosis not present

## 2019-11-20 DIAGNOSIS — I447 Left bundle-branch block, unspecified: Secondary | ICD-10-CM | POA: Diagnosis not present

## 2019-11-20 DIAGNOSIS — L723 Sebaceous cyst: Secondary | ICD-10-CM | POA: Diagnosis present

## 2019-11-20 DIAGNOSIS — D563 Thalassemia minor: Secondary | ICD-10-CM | POA: Diagnosis not present

## 2019-11-20 DIAGNOSIS — E119 Type 2 diabetes mellitus without complications: Secondary | ICD-10-CM | POA: Insufficient documentation

## 2019-11-20 DIAGNOSIS — N401 Enlarged prostate with lower urinary tract symptoms: Secondary | ICD-10-CM | POA: Diagnosis not present

## 2019-11-20 HISTORY — DX: Localized edema: R60.0

## 2019-11-20 HISTORY — DX: Other symptoms and signs involving the musculoskeletal system: R29.898

## 2019-11-20 HISTORY — DX: Atherosclerosis of aorta: I70.0

## 2019-11-20 HISTORY — DX: Unspecified hearing loss, unspecified ear: H91.90

## 2019-11-20 HISTORY — DX: Cardiac murmur, unspecified: R01.1

## 2019-11-20 HISTORY — DX: Presence of coronary angioplasty implant and graft: Z95.5

## 2019-11-20 HISTORY — DX: Atrioventricular block, first degree: I44.0

## 2019-11-20 HISTORY — DX: Presence of external hearing-aid: Z97.4

## 2019-11-20 HISTORY — DX: Personal history of other infectious and parasitic diseases: Z86.19

## 2019-11-20 HISTORY — DX: Benign prostatic hyperplasia with lower urinary tract symptoms: N40.1

## 2019-11-20 HISTORY — DX: Presence of aortocoronary bypass graft: Z95.1

## 2019-11-20 HISTORY — DX: Presence of spectacles and contact lenses: Z97.3

## 2019-11-20 HISTORY — DX: Other forms of dyspnea: R06.09

## 2019-11-20 HISTORY — DX: Other symptoms and signs involving cognitive functions and awareness: R41.89

## 2019-11-20 HISTORY — DX: Chronic combined systolic (congestive) and diastolic (congestive) heart failure: I50.42

## 2019-11-20 HISTORY — PX: MASS EXCISION: SHX2000

## 2019-11-20 HISTORY — DX: Type 2 diabetes mellitus without complications: E11.9

## 2019-11-20 HISTORY — DX: Chronic obstructive pulmonary disease, unspecified: J44.9

## 2019-11-20 HISTORY — DX: Other chronic pain: G89.29

## 2019-11-20 HISTORY — DX: Left bundle-branch block, unspecified: I44.7

## 2019-11-20 HISTORY — DX: Thalassemia minor: D56.3

## 2019-11-20 LAB — POCT I-STAT, CHEM 8
BUN: 17 mg/dL (ref 8–23)
Calcium, Ion: 1.22 mmol/L (ref 1.15–1.40)
Chloride: 99 mmol/L (ref 98–111)
Creatinine, Ser: 0.5 mg/dL — ABNORMAL LOW (ref 0.61–1.24)
Glucose, Bld: 107 mg/dL — ABNORMAL HIGH (ref 70–99)
HCT: 32 % — ABNORMAL LOW (ref 39.0–52.0)
Hemoglobin: 10.9 g/dL — ABNORMAL LOW (ref 13.0–17.0)
Potassium: 3.9 mmol/L (ref 3.5–5.1)
Sodium: 145 mmol/L (ref 135–145)
TCO2: 26 mmol/L (ref 22–32)

## 2019-11-20 LAB — GLUCOSE, CAPILLARY: Glucose-Capillary: 103 mg/dL — ABNORMAL HIGH (ref 70–99)

## 2019-11-20 SURGERY — EXCISION MASS
Anesthesia: Monitor Anesthesia Care | Laterality: Left

## 2019-11-20 MED ORDER — LIDOCAINE HCL 1 % IJ SOLN
INTRAMUSCULAR | Status: DC | PRN
  Administered 2019-11-20: 40 mg via INTRADERMAL

## 2019-11-20 MED ORDER — ONDANSETRON HCL 4 MG/2ML IJ SOLN
4.0000 mg | Freq: Once | INTRAMUSCULAR | Status: DC | PRN
  Filled 2019-11-20: qty 2

## 2019-11-20 MED ORDER — CEFAZOLIN SODIUM-DEXTROSE 2-4 GM/100ML-% IV SOLN
INTRAVENOUS | Status: AC
  Filled 2019-11-20: qty 100

## 2019-11-20 MED ORDER — ACETAMINOPHEN 500 MG PO TABS
ORAL_TABLET | ORAL | Status: AC
  Filled 2019-11-20: qty 2

## 2019-11-20 MED ORDER — CEFAZOLIN SODIUM-DEXTROSE 2-4 GM/100ML-% IV SOLN
2.0000 g | INTRAVENOUS | Status: AC
  Administered 2019-11-20: 2 g via INTRAVENOUS
  Filled 2019-11-20: qty 100

## 2019-11-20 MED ORDER — FENTANYL CITRATE (PF) 100 MCG/2ML IJ SOLN
INTRAMUSCULAR | Status: DC | PRN
  Administered 2019-11-20: 25 ug via INTRAVENOUS

## 2019-11-20 MED ORDER — PROPOFOL 10 MG/ML IV BOLUS
INTRAVENOUS | Status: DC | PRN
  Administered 2019-11-20: 5 mg via INTRAVENOUS
  Administered 2019-11-20: 10 mg via INTRAVENOUS
  Administered 2019-11-20 (×2): 5 mg via INTRAVENOUS

## 2019-11-20 MED ORDER — LACTATED RINGERS IV SOLN
INTRAVENOUS | Status: DC
  Filled 2019-11-20: qty 1000

## 2019-11-20 MED ORDER — CHLORHEXIDINE GLUCONATE CLOTH 2 % EX PADS
6.0000 | MEDICATED_PAD | Freq: Once | CUTANEOUS | Status: DC
  Filled 2019-11-20: qty 6

## 2019-11-20 MED ORDER — OXYCODONE HCL 5 MG/5ML PO SOLN
5.0000 mg | Freq: Once | ORAL | Status: DC | PRN
  Filled 2019-11-20: qty 5

## 2019-11-20 MED ORDER — FENTANYL CITRATE (PF) 100 MCG/2ML IJ SOLN
25.0000 ug | INTRAMUSCULAR | Status: DC | PRN
  Filled 2019-11-20: qty 1

## 2019-11-20 MED ORDER — GLYCOPYRROLATE 0.2 MG/ML IJ SOLN
INTRAMUSCULAR | Status: DC | PRN
  Administered 2019-11-20: .1 mg via INTRAVENOUS

## 2019-11-20 MED ORDER — OXYCODONE HCL 5 MG PO TABS
5.0000 mg | ORAL_TABLET | Freq: Once | ORAL | Status: DC | PRN
  Filled 2019-11-20: qty 1

## 2019-11-20 MED ORDER — ACETAMINOPHEN 500 MG PO TABS
1000.0000 mg | ORAL_TABLET | ORAL | Status: AC
  Administered 2019-11-20: 1000 mg via ORAL
  Filled 2019-11-20: qty 2

## 2019-11-20 MED ORDER — BUPIVACAINE HCL 0.5 % IJ SOLN
INTRAMUSCULAR | Status: DC | PRN
  Administered 2019-11-20: 20 mL

## 2019-11-20 SURGICAL SUPPLY — 56 items
ADH SKN CLS APL DERMABOND .7 (GAUZE/BANDAGES/DRESSINGS) ×1
APL PRP STRL LF DISP 70% ISPRP (MISCELLANEOUS) ×1
APL SKNCLS STERI-STRIP NONHPOA (GAUZE/BANDAGES/DRESSINGS)
BENZOIN TINCTURE PRP APPL 2/3 (GAUZE/BANDAGES/DRESSINGS) IMPLANT
BLADE CLIPPER SENSICLIP SURGIC (BLADE) IMPLANT
BLADE HEX COATED 2.75 (ELECTRODE) ×3 IMPLANT
BLADE SURG 15 STRL LF DISP TIS (BLADE) ×1 IMPLANT
BLADE SURG 15 STRL SS (BLADE) ×3
BNDG GAUZE ELAST 4 BULKY (GAUZE/BANDAGES/DRESSINGS) IMPLANT
CHLORAPREP W/TINT 26 (MISCELLANEOUS) ×3 IMPLANT
CLOSURE WOUND 1/2 X4 (GAUZE/BANDAGES/DRESSINGS)
COVER BACK TABLE 60X90IN (DRAPES) ×3 IMPLANT
COVER MAYO STAND STRL (DRAPES) ×3 IMPLANT
COVER WAND RF STERILE (DRAPES) ×6 IMPLANT
DECANTER SPIKE VIAL GLASS SM (MISCELLANEOUS) IMPLANT
DERMABOND ADVANCED (GAUZE/BANDAGES/DRESSINGS) ×2
DERMABOND ADVANCED .7 DNX12 (GAUZE/BANDAGES/DRESSINGS) IMPLANT
DRAIN PENROSE 18X1/2 LTX STRL (DRAIN) IMPLANT
DRAIN PENROSE 18X1/4 LTX STRL (WOUND CARE) IMPLANT
DRAPE LAPAROTOMY 100X72 PEDS (DRAPES) ×3 IMPLANT
DRAPE ORTHO SPLIT 87X125 STRL (DRAPES) ×2 IMPLANT
DRAPE UTILITY XL STRL (DRAPES) ×3 IMPLANT
DRSG TEGADERM 4X4.75 (GAUZE/BANDAGES/DRESSINGS) IMPLANT
ELECT REM PT RETURN 9FT ADLT (ELECTROSURGICAL) ×3
ELECTRODE REM PT RTRN 9FT ADLT (ELECTROSURGICAL) ×1 IMPLANT
GAUZE SPONGE 4X4 12PLY STRL (GAUZE/BANDAGES/DRESSINGS) IMPLANT
GLOVE BIOGEL PI IND STRL 7.0 (GLOVE) ×1 IMPLANT
GLOVE BIOGEL PI INDICATOR 7.0 (GLOVE) ×2
GLOVE SURG SS PI 7.0 STRL IVOR (GLOVE) ×3 IMPLANT
GOWN STRL REUS W/TWL LRG LVL3 (GOWN DISPOSABLE) ×3 IMPLANT
GOWN STRL REUS W/TWL XL LVL3 (GOWN DISPOSABLE) ×3 IMPLANT
KIT TURNOVER CYSTO (KITS) ×3 IMPLANT
NDL HYPO 25X1 1.5 SAFETY (NEEDLE) ×1 IMPLANT
NEEDLE HYPO 25X1 1.5 SAFETY (NEEDLE) ×3 IMPLANT
NS IRRIG 500ML POUR BTL (IV SOLUTION) IMPLANT
PACK BASIN DAY SURGERY FS (CUSTOM PROCEDURE TRAY) ×3 IMPLANT
PENCIL BUTTON HOLSTER BLD 10FT (ELECTRODE) ×3 IMPLANT
SPONGE LAP 4X18 RFD (DISPOSABLE) IMPLANT
STRIP CLOSURE SKIN 1/2X4 (GAUZE/BANDAGES/DRESSINGS) IMPLANT
SUCTION FRAZIER TIP 10 FR DISP (SUCTIONS) IMPLANT
SUT MNCRL AB 4-0 PS2 18 (SUTURE) ×2 IMPLANT
SUT SILK 3 0 TIES 17X18 (SUTURE)
SUT SILK 3-0 18XBRD TIE BLK (SUTURE) IMPLANT
SUT VIC AB 2-0 SH 27 (SUTURE)
SUT VIC AB 2-0 SH 27XBRD (SUTURE) IMPLANT
SUT VIC AB 3-0 SH 27 (SUTURE)
SUT VIC AB 3-0 SH 27X BRD (SUTURE) IMPLANT
SUT VIC AB 3-0 SH 8-18 (SUTURE) ×2 IMPLANT
SWAB COLLECTION DEVICE MRSA (MISCELLANEOUS) IMPLANT
SWAB CULTURE ESWAB REG 1ML (MISCELLANEOUS) IMPLANT
SYR BULB IRRIGATION 50ML (SYRINGE) IMPLANT
SYR CONTROL 10ML LL (SYRINGE) ×3 IMPLANT
TOWEL OR 17X26 10 PK STRL BLUE (TOWEL DISPOSABLE) ×3 IMPLANT
TUBE CONNECTING 12'X1/4 (SUCTIONS) ×1
TUBE CONNECTING 12X1/4 (SUCTIONS) ×2 IMPLANT
YANKAUER SUCT BULB TIP NO VENT (SUCTIONS) IMPLANT

## 2019-11-20 NOTE — Op Note (Signed)
Preoperative diagnosis: left chest wall mass  Postoperative diagnosis: same   Procedure: excision of 2 cm subcutaneous chest wall mass  Surgeon: Gurney Maxin, M.D.  Asst: none  Anesthesia: MAC  Indications for procedure: Dakota [name] is a 86 y.o. year old male with symptoms of blue subcutaneous mass that was uncomfortable and family was concerned about the mass. After risks and alternatives were explained, decision was made to proceed with excision.  Description of procedure: The patient was brought into the operative suite. Anesthesia was administered with Monitored Local Anesthesia with Sedation. WHO checklist was applied. The patient was then placed in supine position. The area was prepped and draped in the usual sterile fashion.  10 ml marcaine was infused into the area. Next, a horizontal incision was made over the mass. Cautery was used to dissect around the mass and remove it in its entirety. The mass was blue and concerning for an old blood clot. Additional 10 ml marcaine was infused into the deep spaces. Dermabond was put in place for dressing. Cautery was used for hemostasis. A 3-0 vicryl was used to close the space and appose the deep tissues. An interrupted 4-0 monocryl was used to close the skin in subcuticular fashion.  Findings: 2-3 cm blue mass, superficial to fascia  Specimen: chest wall mass  Implant: none   Blood loss: 10 ml  Local anesthesia: 20 ml marcaine   Complications: none  Gurney Maxin, M.D. General, Bariatric, & Minimally Invasive Surgery [facility]

## 2019-11-20 NOTE — Anesthesia Preprocedure Evaluation (Signed)
Anesthesia Evaluation  Patient identified by MRN, date of birth, ID band Patient awake    Reviewed: Allergy & Precautions, NPO status , Patient's Chart, lab work & pertinent test results  History of Anesthesia Complications Negative for: history of anesthetic complications  Airway Mallampati: II  TM Distance: >3 FB Neck ROM: Full    Dental  (+) Teeth Intact   Pulmonary COPD,    Pulmonary exam normal        Cardiovascular hypertension, + angina + CAD, + Cardiac Stents (2016), + CABG (2005) and +CHF  Normal cardiovascular exam+ dysrhythmias Atrial Fibrillation      Neuro/Psych negative neurological ROS  negative psych ROS   GI/Hepatic Neg liver ROS, GERD  ,  Endo/Other  diabetes, Type 2  Renal/GU negative Renal ROS  negative genitourinary   Musculoskeletal negative musculoskeletal ROS (+)   Abdominal   Peds  Hematology negative hematology ROS (+)   Anesthesia Other Findings Echo 07/19/19: EF 50-55%  Reproductive/Obstetrics                             Anesthesia Physical Anesthesia Plan  ASA: III  Anesthesia Plan: MAC   Post-op Pain Management:    Induction: Intravenous  PONV Risk Score and Plan: Propofol infusion, TIVA and Treatment may vary due to age or medical condition  Airway Management Planned: Natural Airway, Nasal Cannula and Simple Face Mask  Additional Equipment: None  Intra-op Plan:   Post-operative Plan:   Informed Consent: I have reviewed the patients History and Physical, chart, labs and discussed the procedure including the risks, benefits and alternatives for the proposed anesthesia with the patient or authorized representative who has indicated his/her understanding and acceptance.       Plan Discussed with:   Anesthesia Plan Comments:         Anesthesia Quick Evaluation

## 2019-11-20 NOTE — H&P (Signed)
Dakota [name] is an 86 y.o. male.   Chief Complaint: chest wall mass HPI: 86 yo male with slowly growing mass in the left anterior subcutaneous chest wall. It is uncomfortable. It has never been infected or drained.  Past Medical History:  Diagnosis Date  . Aortic atherosclerosis ([place])   . Arthritis   . Benign localized prostatic hyperplasia with lower urinary tract symptoms (LUTS)   . Chronic chest pain    takes imdur  . Chronic combined systolic (congestive) and diastolic (congestive) heart failure ([place])    followed by cardiology  . Chronic dyspnea   . Cognitive impairment   . COPD, moderate ([place])   . Edema of both lower extremities    takes lasix and uses teds  . First degree heart block   . GERD (gastroesophageal reflux disease)   . Heart murmur   . History of cardiovascular stress test    Lexiscan Myoview 7/16:  EF 51%, inferior, inferolateral ischemia; Intermediate Risk  . History of smallpox   . HOH (hard of hearing)    even with hearing aids  . Hypertension   . Ischemic heart disease cariologist--- dr t. [name]   a. s/p CABG;  b.  LHC 05/23/15:  S-RPAVB ok, S-D2 ok, S-OM2 99% (s/p Synergy DES), L-LAD ok, EF normal (complicated by pseudoaneurysm)   . LBBB (left bundle branch block)   . Right arm weakness   . S/P CABG x 4    01-08-2004  . S/P drug eluting coronary stent placement    05-21-2015  PCI and DES x1 to SVG--OM graft  . Thalassemia minor   . Type 2 diabetes, diet controlled ([place])   . Wears glasses   . Wears hearing aid in both ears     Past Surgical History:  Procedure Laterality Date  . APPENDECTOMY    . CARDIAC CATHETERIZATION  1989   in [place]   normal    . CARDIAC CATHETERIZATION N/A 05/23/2015   Procedure: Left Heart Cath and Cors/Grafts Angiography;  Surgeon: Troy Sine, MD;  Location: [place] [facility];  Service: Cardiovascular;  Laterality: N/A;  . CARDIAC CATHETERIZATION N/A 05/23/2015   Procedure: Coronary Stent Intervention;  Surgeon:  Troy Sine, MD;  Location: [place] [facility];  Service: Cardiovascular;  Laterality: N/A;  . CARDIAC CATHETERIZATION  01-04-2004   dr Cathie Olden  @[facility]    severe 3V CAD, normal LVF  . CATARACT EXTRACTION W/ INTRAOCULAR LENS  IMPLANT, BILATERAL    . CORONARY ARTERY BYPASS GRAFT  01-08-2004  dr hendrickson @[facility]    LIMA to LAD,  SVG to D1,  SVG to OM,  SVG to PDA  . FOOT SURGERY Right   . INGUINAL HERNIA REPAIR Bilateral 1978    Family History  Problem Relation Age of Onset  . Healthy Father   . Ulcers Mother   . Stroke Mother    Social History:  reports that he has never smoked. He has never used smokeless tobacco. He reports that he does not drink alcohol or use drugs.  Allergies:  Allergies  Allergen Reactions  . Aceon [Perindopril Erbumine] Shortness Of Breath and Cough  . Sulfa Antibiotics Swelling  . Pork-Derived Products     muslim    Facility-Administered Medications Prior to Admission  Medication Dose Route Frequency Provider Last Rate Last Admin  . SOLUTION-PLUS STOPCOCK MISC   Does not apply Once Lorretta Harp, MD       Medications Prior to Admission  Medication Sig  Dispense Refill  . albuterol (PROVENTIL HFA;VENTOLIN HFA) 108 (90 Base) MCG/ACT inhaler Inhale 2 puffs into the lungs every 4 (four) hours as needed for wheezing or shortness of breath. 1 Inhaler 0  . amLODipine (NORVASC) 2.5 MG tablet Take 2.5 mg by mouth daily.    [name] docusate sodium (COLACE) 100 MG capsule Take 100 mg by mouth at bedtime.    . finasteride (PROSCAR) 5 MG tablet Take 5 mg by mouth at bedtime.     . furosemide (LASIX) 20 MG tablet Take 1 tablet (20 mg total) by mouth every other day. 90 tablet 3  . isosorbide mononitrate (IMDUR) 30 MG 24 hr tablet Take 1 tablet (30 mg total) by mouth daily. (Patient taking differently: Take 30 mg by mouth daily. ) 90 tablet 2  . mirabegron ER (MYRBETRIQ) 50 MG TB24 tablet Take 50 mg by mouth at bedtime.     . Multiple Vitamin (MULTIVITAMIN WITH MINERALS) TABS  tablet Take 1 tablet by mouth daily.    . Multiple Vitamins-Minerals (PRESERVISION AREDS 2 PO) Take 1 tablet by mouth 2 (two) times daily.     . ONE TOUCH ULTRA TEST test strip 1 each by Other route as needed (glucose monoring).     . pantoprazole (PROTONIX) 40 MG tablet Take 1 tablet (40 mg total) by mouth daily. (Patient taking differently: Take 40 mg by mouth daily. ) 90 tablet 3  . pravastatin (PRAVACHOL) 80 MG tablet Take 80 mg by mouth at bedtime.     . terbinafine (LAMISIL) 250 MG tablet Take 250 mg by mouth daily.    [name] aspirin EC 81 MG tablet Take 81 mg by mouth daily.    . clopidogrel (PLAVIX) 75 MG tablet Take 1 tablet (75 mg total) by mouth daily. 90 tablet 0  . NITROSTAT 0.4 MG SL tablet PLACE 1 TABLET UNDER THE TONGUE EVERY 5 MINUTES AS NEEDED FOR CHEST PAIN (Patient taking differently: Place 0.4 mg under the tongue every 5 (five) minutes as needed. ) 25 tablet 1    Results for orders placed or performed during the hospital encounter of 11/20/19 (from the past 48 hour(s))  I-STAT, chem 8     Status: Abnormal   Collection Time: 11/20/19 12:54 PM  Result Value Ref Range   Sodium 145 135 - 145 mmol/L   Potassium 3.9 3.5 - 5.1 mmol/L   Chloride 99 98 - 111 mmol/L   BUN 17 8 - 23 mg/dL   Creatinine, Ser 0.50 (L) 0.61 - 1.24 mg/dL   Glucose, Bld 107 (H) 70 - 99 mg/dL   Calcium, Ion 1.22 1.15 - 1.40 mmol/L   TCO2 26 22 - 32 mmol/L   Hemoglobin 10.9 (L) 13.0 - 17.0 g/dL   HCT 32.0 (L) 39.0 - 52.0 %   No results found.  Review of Systems  Constitutional: Negative for chills and fever.  HENT: Negative for hearing loss.   Respiratory: Negative for cough.   Cardiovascular: Negative for chest pain and palpitations.  Gastrointestinal: Negative for abdominal pain, nausea and vomiting.  Genitourinary: Negative for dysuria and urgency.  Musculoskeletal: Negative for myalgias and neck pain.  Skin: Negative for rash.  Neurological: Negative for dizziness and headaches.  Hematological:  Does not bruise/bleed easily.  Psychiatric/Behavioral: Negative for suicidal ideas.    Blood pressure (!) 149/58, pulse (!) 58, temperature (!) 97.4 F (36.3 C), temperature source Oral, resp. rate 16, height 5\' 4"  (1.626 m), weight 50.3 kg, SpO2 100 %. Physical Exam  Vitals  reviewed. Constitutional: He is oriented to person, place, and time. He appears well-developed and well-nourished.  HENT:  Head: Normocephalic and atraumatic.  Eyes: Pupils are equal, round, and reactive to light. Conjunctivae and EOM are normal.  Cardiovascular: Normal rate and regular rhythm.  Respiratory: Effort normal and breath sounds normal.  2 cm left blue mass  GI: Soft. Bowel sounds are normal. He exhibits no distension. There is no abdominal tenderness.  Musculoskeletal:        General: Normal range of motion.     Cervical back: Normal range of motion and neck supple.  Neurological: He is alert and oriented to person, place, and time.  Skin: Skin is warm and dry.  Psychiatric: He has a normal mood and affect. His behavior is normal.     Assessment/Plan 86 yo male with small chest subcutaneous mass -excision of mass -planned outpatient procedure  Mickeal Skinner, MD 11/20/2019, 1:57 PM

## 2019-11-20 NOTE — Anesthesia Postprocedure Evaluation (Signed)
Anesthesia Post Note  Patient: Dakota [name]  Procedure(s) Performed: EXCISION OF SEBACEOUS CYST CHEST (Left )     Patient location during evaluation: Phase II Anesthesia Type: MAC Level of consciousness: awake and alert Pain management: pain level controlled Vital Signs Assessment: post-procedure vital signs reviewed and stable Respiratory status: spontaneous breathing, nonlabored ventilation and respiratory function stable Cardiovascular status: blood pressure returned to baseline and stable Postop Assessment: no apparent nausea or vomiting Anesthetic complications: no    Last Vitals:  Vitals:   11/20/19 1545 11/20/19 1600  BP: (!) 139/59 (!) 133/59  Pulse: (!) 45 (!) 41  Resp: (!) 22 (!) 29  Temp:    SpO2: 97% 97%    Last Pain:  Vitals:   11/20/19 1630  TempSrc:   PainSc: 0-No pain                 Lidia Collum

## 2019-11-20 NOTE — Discharge Instructions (Signed)

## 2019-11-20 NOTE — Transfer of Care (Signed)
Immediate Anesthesia Transfer of Care Note  Patient: Dakota [name]  Procedure(s) Performed: Procedure(s) (LRB): EXCISION OF SEBACEOUS CYST CHEST (Left)  Patient Location: PACU  Anesthesia Type: General  Level of Consciousness: awake, sedated, patient cooperative and responds to stimulation  Airway & Oxygen Therapy: Patient Spontanous Breathing and Patient connected to face mask oxygen and soft FM   Post-op Assessment: Report given to PACU RN, Post -op Vital signs reviewed and stable and Patient moving all extremities  Post vital signs: Reviewed and stable  Complications: No apparent anesthesia complications

## 2019-11-21 LAB — SURGICAL PATHOLOGY

## 2019-11-26 DIAGNOSIS — I499 Cardiac arrhythmia, unspecified: Secondary | ICD-10-CM | POA: Insufficient documentation

## 2019-11-28 ENCOUNTER — Institutional Professional Consult (permissible substitution): Payer: BC Managed Care – PPO | Admitting: Internal Medicine

## 2019-11-29 ENCOUNTER — Telehealth: Payer: BC Managed Care – PPO | Admitting: Physician Assistant

## 2019-12-17 ENCOUNTER — Ambulatory Visit: Payer: BC Managed Care – PPO | Attending: Internal Medicine

## 2019-12-17 ENCOUNTER — Ambulatory Visit: Payer: BC Managed Care – PPO

## 2019-12-17 DIAGNOSIS — Z23 Encounter for immunization: Secondary | ICD-10-CM | POA: Insufficient documentation

## 2019-12-17 NOTE — Progress Notes (Signed)
   Covid-19 Vaccination Clinic  Name:  Dakota [name]    MRN: [id number] DOB: May 03, 1926  12/17/2019  [name] was observed post Covid-19 immunization for 15 minutes without incidence. He was provided with Vaccine Information Sheet and instruction to access the V-Safe system.   [name] was instructed to call 911 with any severe reactions post vaccine: [name] Difficulty breathing  . Swelling of your face and throat  . A fast heartbeat  . A bad rash all over your body  . Dizziness and weakness    Immunizations Administered    Name Date Dose VIS Date Route   Pfizer COVID-19 Vaccine 12/17/2019  3:01 PM 0.3 mL 10/06/2019 Intramuscular   Manufacturer: [place]   Lot: [id number]   [place]: [id number]

## 2020-01-02 ENCOUNTER — Telehealth: Payer: Self-pay

## 2020-01-02 NOTE — Telephone Encounter (Signed)
I called patient to verify mailing address.  I had to leave message.

## 2020-01-03 ENCOUNTER — Telehealth: Payer: Self-pay | Admitting: Cardiology

## 2020-01-03 ENCOUNTER — Other Ambulatory Visit: Payer: Self-pay

## 2020-01-03 ENCOUNTER — Telehealth: Payer: Self-pay | Admitting: Radiology

## 2020-01-03 DIAGNOSIS — I499 Cardiac arrhythmia, unspecified: Secondary | ICD-10-CM

## 2020-01-03 DIAGNOSIS — I4949 Other premature depolarization: Secondary | ICD-10-CM

## 2020-01-03 DIAGNOSIS — I4430 Unspecified atrioventricular block: Secondary | ICD-10-CM

## 2020-01-03 NOTE — Telephone Encounter (Signed)
Enrolled patient for a 14 day Zio monitor to be mailed to patients home. Address was verified as patient states he didn't receive his 1st monitor that was mailed in Dec

## 2020-01-03 NOTE — Telephone Encounter (Signed)
I spoke to [facility] and placed new order for 14 Day ZIO Monitor.

## 2020-01-03 NOTE — Telephone Encounter (Signed)
[employer] calling with information on patient's Zio patch monitor.  REF # [id number]

## 2020-01-10 ENCOUNTER — Ambulatory Visit: Payer: BC Managed Care – PPO | Attending: Internal Medicine

## 2020-01-10 DIAGNOSIS — Z23 Encounter for immunization: Secondary | ICD-10-CM

## 2020-01-10 NOTE — Progress Notes (Signed)
   Covid-19 Vaccination Clinic  Name:  Dakota [name]    MRN: [id number] DOB: 11/16/1925  01/10/2020  Dakota [name] was observed post Covid-19 immunization for 15 minutes without incident. He was provided with Vaccine Information Sheet and instruction to access the V-Safe system.   Dakota [name] was instructed to call 911 with any severe reactions post vaccine: [name] Difficulty breathing  . Swelling of face and throat  . A fast heartbeat  . A bad rash all over body  . Dizziness and weakness   Immunizations Administered    Name Date Dose VIS Date Route   Pfizer COVID-19 Vaccine 01/10/2020  2:07 PM 0.3 mL 10/06/2019 Intramuscular   Manufacturer: [place]   Lot: [id number]   [place]: [id number]

## 2020-02-05 ENCOUNTER — Institutional Professional Consult (permissible substitution): Payer: BC Managed Care – PPO | Admitting: Internal Medicine

## 2020-02-06 ENCOUNTER — Institutional Professional Consult (permissible substitution): Payer: BC Managed Care – PPO | Admitting: Internal Medicine

## 2020-02-19 ENCOUNTER — Telehealth: Payer: Self-pay | Admitting: Cardiovascular Disease

## 2020-02-19 NOTE — Telephone Encounter (Signed)
Pt's daughter calling in to have Dr.Kelly fill out Medical leave paperwork for her father.  She states he has been off work for 6-7 months and that Dr.Kelly is who advised that he not work due to his age and risk of contracting COVID 17. pts daughter would like this paperwork to be filled out and for a note to be written stating that her father is elderly and high risk. They do not want him to lose this job and they need the paperwork finished by May 10th. She states the paperwork needs to have the leave from April 20th 2021 until October 21st 2021.  Currently the daughter is in the hospital and with either be discharged today or tomorrow and she will get [place] the paperwork as soon as she is discharged. Notified that as soon as we receive the paperwork we can begin filling it out. Daughter verbalized understanding and stated if we had any issues while completing the paperwork to call her. No other questions from her at this time.

## 2020-02-19 NOTE — Telephone Encounter (Signed)
Patient's daughter is requesting to speak with Dr. Evette Georges nurse in regards to signing paperwork for the patient to leave work. Please call to discuss.

## 2020-02-25 NOTE — Telephone Encounter (Signed)
Okay to write note for his medical leave.

## 2020-02-29 NOTE — Telephone Encounter (Signed)
Routed to primary nurse concerning letter

## 2020-02-29 NOTE — Telephone Encounter (Signed)
Daughter walked in concerning paperwork. She states that patient's employer/[employer] must have paperwork by Monday may 10 but she requesting that it be faxed tomorrow. She states fax # should be on the first page of paperwork but she also provided a fax # of [phone]. She would like a call when it is faxed and they would like a copy mailed to them or daughter will come by to pick up.   If nurse has questions or concerns, message via MyChart or call daughter

## 2020-02-29 NOTE — Telephone Encounter (Signed)
Patient's daughter is calling in regards to  paperwork for employer. She states the paperwork is needed prior to 03/04/20. She is requesting to come by the office to pick it up. Please advise.

## 2020-03-01 ENCOUNTER — Telehealth: Payer: Self-pay

## 2020-03-01 NOTE — Telephone Encounter (Signed)
Spoke with daughter notified that the paperwork was being sent and they could come pick the paperwork up at their earliest convenience

## 2020-03-01 NOTE — Telephone Encounter (Signed)
Left detailed VM on daughter's cell phone regarding the LOA for her father. The paperwork has to have them sign consent for our office to release pt health information. Notified for her to call back to coordinate a time for her to come to office to sign paperwork and pick it up.

## 2020-03-26 ENCOUNTER — Other Ambulatory Visit: Payer: Self-pay | Admitting: Cardiovascular Disease

## 2020-03-26 DIAGNOSIS — I5042 Chronic combined systolic (congestive) and diastolic (congestive) heart failure: Secondary | ICD-10-CM

## 2020-03-26 DIAGNOSIS — I10 Essential (primary) hypertension: Secondary | ICD-10-CM

## 2020-03-26 DIAGNOSIS — I25119 Atherosclerotic heart disease of native coronary artery with unspecified angina pectoris: Secondary | ICD-10-CM

## 2020-04-10 ENCOUNTER — Other Ambulatory Visit: Payer: Self-pay

## 2020-04-10 MED ORDER — CLOPIDOGREL BISULFATE 75 MG PO TABS: 75.0000 mg | ORAL_TABLET | Freq: Every day | ORAL | 2 refills | Status: DC

## 2020-04-11 ENCOUNTER — Other Ambulatory Visit: Payer: Self-pay | Admitting: Cardiovascular Disease

## 2020-04-12 MED ORDER — AMLODIPINE BESYLATE 2.5 MG PO TABS: 2.5000 mg | ORAL_TABLET | Freq: Every day | ORAL | 1 refills | Status: DC

## 2020-04-12 NOTE — Telephone Encounter (Signed)
*  STAT* If patient is at the pharmacy, call can be transferred to refill team.   1. Which medications need to be refilled? (please list name of each medication and dose if known) isosorbide mononitrate (IMDUR) 30 MG 24 hr tablet amLODipine (NORVASC) 2.5 MG tablet   2. Which pharmacy/location (including street and city if local pharmacy) is medication to be sent to? [place], [place] - [address]  3. Do they need a 30 day or 90 day supply? 90 day

## 2020-04-12 NOTE — Addendum Note (Signed)
Addended by: Venetia Maxon on: 04/12/2020 12:45 PM   Modules accepted: Orders

## 2020-04-15 ENCOUNTER — Other Ambulatory Visit: Payer: Self-pay | Admitting: Cardiovascular Disease

## 2020-05-11 ENCOUNTER — Other Ambulatory Visit: Payer: Self-pay | Admitting: Cardiovascular Disease

## 2020-05-13 NOTE — Telephone Encounter (Signed)
Rx(s) sent to pharmacy electronically.  

## 2020-06-17 ENCOUNTER — Telehealth: Payer: Self-pay | Admitting: Cardiovascular Disease

## 2020-06-17 NOTE — Telephone Encounter (Signed)
LVM for patient to return call to get follow up scheduled with [name].Marland Kitchenappointment came through staff messaged

## 2020-06-24 ENCOUNTER — Other Ambulatory Visit: Payer: Self-pay

## 2020-06-24 DIAGNOSIS — I10 Essential (primary) hypertension: Secondary | ICD-10-CM

## 2020-06-24 DIAGNOSIS — I25119 Atherosclerotic heart disease of native coronary artery with unspecified angina pectoris: Secondary | ICD-10-CM

## 2020-06-24 DIAGNOSIS — I5042 Chronic combined systolic (congestive) and diastolic (congestive) heart failure: Secondary | ICD-10-CM

## 2020-06-24 MED ORDER — ISOSORBIDE MONONITRATE ER 30 MG PO TB24: 30.0000 mg | ORAL_TABLET | Freq: Every day | ORAL | 0 refills | Status: DC

## 2020-06-24 MED ORDER — FUROSEMIDE 20 MG PO TABS: 20.0000 mg | ORAL_TABLET | ORAL | 0 refills | Status: DC

## 2020-07-08 ENCOUNTER — Ambulatory Visit: Payer: BC Managed Care – PPO | Admitting: General Practice

## 2020-07-30 NOTE — Progress Notes (Signed)
Cardiology Office Note   Date:  07/31/2020   ID:  Dakota [name], DOB 04-30-1926, MRN [id number]  PCP:  [name], MD Cardiologist:  [name], MD 07/17/2019 Electrphysiologist: None Rosaria Ferries, PA-C   No chief complaint on file.   History of Present Illness: Dakota [name] is a 86 y.o. male with a history of LBBB, chronic combined CHF, CABG 2005, s/p DES SVG-OM 2016, HTN, DM, mod COPD, BPH, cognitive impairment, LE edema, GERD, CP on [place] presents for cardiology follow up.   He has no palpitations. No presyncope or syncope.  No chest pain since stent 2016.   He has daytime LE edema, sometimes wakes with it. He is still working at [employer]. He gets swelling at work, but Dealer has given him a chair. His legs will go down when he is at home. He has a sleep number bed, so can prop up overnight. He does not take Lasix on the days that he works.   He has been losing weight, but has stabilized at 110 lbs. He generally eats pretty well, is no longer on DM meds.   His daughter thinks he may have had stroke. In April, his R arm suddenly did work as well and he was unable to sign things. Sx improved and have completely resolved.    COVID status: vaccinated, did not have COVID Past Medical History:  Diagnosis Date  . Aortic atherosclerosis ([place])   . Arthritis   . Benign localized prostatic hyperplasia with lower urinary tract symptoms (LUTS)   . Chronic chest pain    takes imdur  . Chronic combined systolic (congestive) and diastolic (congestive) heart failure ([place])    followed by cardiology  . Chronic dyspnea   . Cognitive impairment   . COPD, moderate ([place])   . Edema of both lower extremities    takes lasix and uses teds  . First degree heart block   . GERD (gastroesophageal reflux disease)   . Heart murmur   . History of cardiovascular stress test    Lexiscan Myoview 7/16:  EF 51%, inferior, inferolateral ischemia; Intermediate Risk  .  History of smallpox   . HOH (hard of hearing)    even with hearing aids  . Hypertension   . Ischemic heart disease 2005   Dr [name] a. s/p CABG;  b.  LHC 05/23/15:  S-RPAVB ok, S-D2 ok, S-OM2 99% (s/p Synergy DES), L-LAD ok, EF normal (complicated by pseudoaneurysm)   . LBBB (left bundle branch block)   . Right arm weakness   . S/P CABG x 4    01-08-2004  . S/P drug eluting coronary stent placement    05-21-2015  PCI and DES x1 to SVG--OM graft  . Thalassemia minor   . Type 2 diabetes, diet controlled ([place])   . Wears glasses   . Wears hearing aid in both ears     Past Surgical History:  Procedure Laterality Date  . APPENDECTOMY    . CARDIAC CATHETERIZATION  1989   in [place]   normal    . CARDIAC CATHETERIZATION N/A 05/23/2015   Procedure: Left Heart Cath and Cors/Grafts Angiography;  Surgeon: Troy Sine, MD;  Location: [facility];  Service: Cardiovascular;  Laterality: N/A;  . CARDIAC CATHETERIZATION N/A 05/23/2015   Procedure: Coronary Stent Intervention;  Surgeon: Troy Sine, MD;  Location: [facility];  Service: Cardiovascular;  Laterality: N/A;  . CARDIAC CATHETERIZATION  01-04-2004   dr Cathie Olden  @[facility]    severe 3V CAD, normal LVF  . CATARACT EXTRACTION W/ INTRAOCULAR LENS  IMPLANT, BILATERAL    . CORONARY ARTERY BYPASS GRAFT  01-08-2004  dr hendrickson @[facility]    LIMA to LAD,  SVG to D1,  SVG to OM,  SVG to PDA  . FOOT SURGERY Right   . INGUINAL HERNIA REPAIR Bilateral 1978  . MASS EXCISION Left 11/20/2019   Procedure: EXCISION OF SEBACEOUS CYST CHEST;  Surgeon: Kieth Brightly, Arta Bruce, MD;  Location: [facility];  Service: General;  Laterality: Left;    Current Outpatient Medications  Medication Sig Dispense Refill  . albuterol (PROVENTIL HFA;VENTOLIN HFA) 108 (90 Base) MCG/ACT inhaler Inhale 2 puffs into the lungs every 4 (four) hours as needed for wheezing or shortness of breath. 1 Inhaler 0  . aspirin EC 81 MG tablet Take 81 mg by mouth  daily.    [name] docusate sodium (COLACE) 100 MG capsule Take 100 mg by mouth at bedtime.    . finasteride (PROSCAR) 5 MG tablet Take 5 mg by mouth at bedtime.     . isosorbide mononitrate (IMDUR) 30 MG 24 hr tablet Take 1 tablet (30 mg total) by mouth daily. <PLEASE MAKE APPOINTMENT FOR REFILLS> 30 tablet 0  . mirabegron ER (MYRBETRIQ) 50 MG TB24 tablet Take 50 mg by mouth at bedtime.     . Multiple Vitamin (MULTIVITAMIN WITH MINERALS) TABS tablet Take 1 tablet by mouth daily.    . Multiple Vitamins-Minerals (PRESERVISION AREDS 2 PO) Take 1 tablet by mouth 2 (two) times daily.     [name] NITROSTAT 0.4 MG SL tablet PLACE 1 TABLET UNDER THE TONGUE EVERY 5 MINUTES AS NEEDED FOR CHEST PAIN (Patient taking differently: Place 0.4 mg under the tongue every 5 (five) minutes as needed. ) 25 tablet 1  . ONE TOUCH ULTRA TEST test strip 1 each by Other route as needed (glucose monoring).     . pantoprazole (PROTONIX) 40 MG tablet Take 1 tablet (40 mg total) by mouth daily. (Patient taking differently: Take 40 mg by mouth daily. ) 90 tablet 3  . pravastatin (PRAVACHOL) 80 MG tablet Take 80 mg by mouth at bedtime.     . terbinafine (LAMISIL) 250 MG tablet Take 250 mg by mouth daily.    [name] amLODipine (NORVASC) 2.5 MG tablet Take 1 tablet (2.5 mg total) by mouth daily. 90 tablet 1  . clopidogrel (PLAVIX) 75 MG tablet Take 1 tablet (75 mg total) by mouth daily. 90 tablet 2  . furosemide (LASIX) 20 MG tablet Take 1 tablet (20 mg total) by mouth every other day. 15 tablet 0   Current Facility-Administered Medications  Medication Dose Route Frequency Provider Last Rate Last Admin  . SOLUTION-PLUS STOPCOCK MISC   Does not apply Once Lorretta Harp, MD        Allergies:   Frederic Jericho [perindopril erbumine], Sulfa antibiotics, and Pork-derived products    Social History:  The patient  reports that he has never smoked. He has never used smokeless tobacco. He reports that he does not drink alcohol and does not use drugs.    Family History:  The patient's family history includes Healthy in his father; Stroke in his mother; Ulcers in his mother.  He indicated that his mother is deceased. He indicated that his father is deceased. He indicated that his sister is deceased. He indicated that his maternal grandmother is deceased. He indicated that his maternal grandfather is deceased. He  indicated that his paternal grandmother is deceased. He indicated that his paternal grandfather is deceased.  ROS:  Please see the history of present illness. All other systems are reviewed and negative.    PHYSICAL EXAM: VS:  BP 126/64   Pulse 85   Ht 5\' 3"  (1.6 m)   Wt 111 lb 6.4 oz (50.5 kg)   SpO2 96%   BMI 19.73 kg/m  , BMI Body mass index is 19.73 kg/m. GEN: Well nourished, well developed, male in no acute distress HEENT: normal for age  Neck: no JVD, no carotid bruit, no masses Cardiac: RRR; no murmur, no rubs, or gallops Respiratory:  clear to auscultation bilaterally, normal work of breathing GI: soft, nontender, nondistended, + BS MS: no deformity or atrophy; no edema; distal pulses are 2+ in all 4 extremities  Skin: warm and dry, no rash Neuro:  Strength and sensation are intact Psych: euthymic mood, full affect   EKG:  EKG is ordered today. The ekg ordered today demonstrates Atrial fibrillation, HR 85, LBBB is old  ECHO: 07/19/2019 1. Left ventricular ejection fraction, by visual estimation, is 50 to  55%. The left ventricle has normal function. There is no left ventricular  hypertrophy.  2. Left ventricular diastolic Doppler parameters are consistent with  impaired relaxation pattern of LV diastolic filling.  3. Global right ventricle has normal systolic function.The right  ventricular size is normal. No increase in right ventricular wall  thickness.  4. Left atrial size was mildly dilated.  5. Right atrial size was normal.  6. Moderate calcification of the mitral valve leaflet(s).  7. Moderate  thickening of the mitral valve leaflet(s).  8. Severe aortic valve annular calcification.  9. The mitral valve is abnormal. Trace mitral valve regurgitation.  10. The tricuspid valve is grossly normal. Tricuspid valve regurgitation  is mild.  11. The aortic valve has an indeterminant number of cusps Aortic valve  regurgitation was not visualized by color flow Doppler.  12. The aortic valve is heavily calcified. The leaflet mobility appears to  be reduced but there was not a significant AV gradient detected.  13. The pulmonic valve was grossly normal. Pulmonic valve regurgitation is  not visualized by color flow Doppler.  14. Mildly elevated pulmonary artery systolic pressure.  15. The atrial septum is grossly normal.   CATH: 05/23/2015  Mid LAD lesion, 100% stenosed.  Ost Cx lesion, 100% stenosed.  LM lesion, 95% stenosed.  Prox RCA lesion, 50% stenosed.  Post Atrio lesion, 100% stenosed.  Dist RCA lesion, 50% stenosed.  SVG was injected is normal in caliber, and is anatomically normal.  SVG was injected is normal in caliber, and is anatomically normal.  was injected is normal in caliber.  There is severe disease in the graft.  Dist Graft to Insertion lesion, 99% stenosed. There is a 0% residual stenosis post intervention.  A drug-eluting stent was placed.  The left ventricular systolic function is normal.   Low normal global LV function with an ejection fraction of 50-55%.  Significant native CAD with 95% distal left main stenosis, proximal occlusion of the LAD after the first 2 septal perforating arteries; total occlusion of the circumflex at the ostium; 50% proximal RCA stenosis, 50% mid stenosis and total occlusion of the distal RCA after a small PDA-like vessel.  Patent LIMA graft supplying the mid LAD.  SVG supplying the circumflex marginal vessel with 99% distal anastomosis stenosis.  Patent SVG supplying the diagonal vessel.  Patent SVG apply the  distal RCA with evidence for collateralization of the AV groove circumflex from the PLA vessel.  Successful PCI to the SVG to obtuse marginal vessel, treated with PTCA, Synergy DES stenting with a 3.016 mm stent postdilated to 3.43 mm in the very distal portion of the graft to 3.23 mm in the native marginal vessel with the stenosis being reduced to 0% and brisk 3 TIMI-3 flow.  RECOMMENDATION:  Continued DAPT therapy and medical therapy for concomitant native CAD. Intervention     Recent Labs: 11/20/2019: BUN 17; Creatinine, Ser 0.50; Hemoglobin 10.9; Potassium 3.9; Sodium 145  CBC    Component Value Date/Time   WBC 7.1 02/24/2019 1058   WBC 5.7 04/10/2018 0552   RBC 5.48 02/24/2019 1058   RBC 5.23 04/10/2018 0552   HGB 10.9 (L) 11/20/2019 1254   HGB 10.2 (L) 02/24/2019 1058   HCT 32.0 (L) 11/20/2019 1254   HCT 34.0 (L) 02/24/2019 1058   PLT 226 02/24/2019 1058   MCV 62 (L) 02/24/2019 1058   MCH 18.6 (L) 02/24/2019 1058   MCH 18.4 (L) 04/10/2018 0552   MCHC 30.0 (L) 02/24/2019 1058   MCHC 30.1 04/10/2018 0552   RDW 17.5 (H) 02/24/2019 1058   LYMPHSABS 2.3 06/10/2015 1504   MONOABS 0.7 06/10/2015 1504   EOSABS 0.3 06/10/2015 1504   BASOSABS 0.0 06/10/2015 1504   CMP Latest Ref Rng & Units 11/20/2019 07/17/2019 02/24/2019  Glucose 70 - 99 mg/dL 107(H) 142(H) 116(H)  BUN 8 - 23 mg/dL 17 28 19   Creatinine 0.61 - 1.24 mg/dL 0.50(L) 0.74(L) 0.74(L)  Sodium 135 - 145 mmol/L 145 136 140  Potassium 3.5 - 5.1 mmol/L 3.9 4.7 4.2  Chloride 98 - 111 mmol/L 99 98 100  CO2 20 - 29 mmol/L - 24 24  Calcium 8.6 - 10.2 mg/dL - 9.1 9.2  Total Protein 6.0 - 8.5 g/dL - - 6.6  Total Bilirubin 0.0 - 1.2 mg/dL - - 0.7  Alkaline Phos 39 - 117 IU/L - - 71  AST 0 - 40 IU/L - - 17  ALT 0 - 44 IU/L - - 13     Lipid Panel Lab Results  Component Value Date   CHOL 126 02/24/2019   HDL 55 02/24/2019   LDLCALC 52 02/24/2019   TRIG 93 02/24/2019   CHOLHDL 2.3 02/24/2019      Wt Readings  from Last 3 Encounters:  07/31/20 111 lb 6.4 oz (50.5 kg)  11/20/19 110 lb 14.3 oz (50.3 kg)  09/29/19 110 lb 12.8 oz (50.3 kg)     Other studies Reviewed: Additional studies/ records that were reviewed today include: Office notes, hospital records and testing.  ASSESSMENT AND PLAN:  1. Atrial fibrillation, duration unknown -His heart rate is controlled. He has no history of palpitations. -His CHA2DS2-VASc score is six (age x 2, CAD, DM, HTN, CHF) -I discussed the risks and benefits of anticoagulation with the patient and his daughter. They wish to avoid stroke and will watch for bleeding. -He is over 82 and weight less than 60 kg. Therefore, start Eliquis 2.5 mg twice daily -Because of the Eliquis, DC Plavix and aspirin  2. CAD: -His last stent was in 2016 -Continue Imdur and Pravachol -No ischemic symptoms, no indication for ischemic testing  3. Thalassemia trait, anemia -His anemia is chronic, his hemoglobin is usually about 10 -His MCV is low, this may or may not be due to iron deficiency -Check a CBC next week as well as a BMET and a  ferritin level. -If the ferritin is normal, then he is anemic from the thalassemia, not iron deficiency  4. Chronic combined systolic and diastolic CHF -His weight is stable, he is encouraged not to lose anymore weight. -He is currently on Lasix 20 mg every other day -Check a BMET  5. Hypertension -His blood pressure is well controlled on current therapy.   Current medicines are reviewed at length with the patient today.  The patient has concerns regarding medicines. Concerns were addressed  The following changes have been made: DC aspirin and Plavix, add Eliquis  Labs/ tests ordered today include:   Orders Placed This Encounter  Procedures  . Basic metabolic panel  . CBC  . Ferritin  . EKG 12-Lead     Disposition:   FU with [name], MD  Signed, Rosaria Ferries, PA-C  07/31/2020 7:36 PM    [facility] Phone: [phone]; Fax: [phone]

## 2020-07-31 ENCOUNTER — Ambulatory Visit (INDEPENDENT_AMBULATORY_CARE_PROVIDER_SITE_OTHER): Payer: BC Managed Care – PPO | Admitting: Physician Assistant

## 2020-07-31 ENCOUNTER — Encounter: Payer: Self-pay | Admitting: Physician Assistant

## 2020-07-31 ENCOUNTER — Other Ambulatory Visit: Payer: Self-pay

## 2020-07-31 VITALS — BP 126/64 | HR 85 | Ht 63.0 in | Wt 111.4 lb

## 2020-07-31 DIAGNOSIS — I2510 Atherosclerotic heart disease of native coronary artery without angina pectoris: Secondary | ICD-10-CM | POA: Diagnosis not present

## 2020-07-31 DIAGNOSIS — I480 Paroxysmal atrial fibrillation: Secondary | ICD-10-CM

## 2020-07-31 DIAGNOSIS — I5042 Chronic combined systolic (congestive) and diastolic (congestive) heart failure: Secondary | ICD-10-CM | POA: Diagnosis not present

## 2020-07-31 DIAGNOSIS — D649 Anemia, unspecified: Secondary | ICD-10-CM | POA: Diagnosis not present

## 2020-07-31 DIAGNOSIS — I10 Essential (primary) hypertension: Secondary | ICD-10-CM | POA: Diagnosis not present

## 2020-07-31 DIAGNOSIS — Z79899 Other long term (current) drug therapy: Secondary | ICD-10-CM

## 2020-07-31 MED ORDER — CLOPIDOGREL BISULFATE 75 MG PO TABS: 75.0000 mg | ORAL_TABLET | Freq: Every day | ORAL | 2 refills | Status: DC

## 2020-07-31 MED ORDER — AMLODIPINE BESYLATE 2.5 MG PO TABS: 2.5000 mg | ORAL_TABLET | Freq: Every day | ORAL | 1 refills | Status: DC

## 2020-07-31 MED ORDER — APIXABAN 2.5 MG PO TABS: 2.5000 mg | ORAL_TABLET | Freq: Two times a day (BID) | ORAL | 0 refills | Status: DC

## 2020-07-31 MED ORDER — APIXABAN 2.5 MG PO TABS: 2.5000 mg | ORAL_TABLET | Freq: Two times a day (BID) | ORAL | 3 refills | Status: DC

## 2020-07-31 MED ORDER — FUROSEMIDE 20 MG PO TABS: 20.0000 mg | ORAL_TABLET | ORAL | 0 refills | Status: DC

## 2020-07-31 NOTE — Patient Instructions (Addendum)
Medication Instructions:   STOP Aspirin  STOP Plavix  START Eliquis 2.5 mg 2 times a day  *If you need a refill on your cardiac medications before your next appointment, please call your pharmacy*  Lab Work: Your physician recommends that you return for lab work in 1 week:   BMP  CBC  Ferritin If you have labs (blood work) drawn today and your tests are completely normal, you will receive your results only by: [name] MyChart Message (if you have MyChart) OR . A paper copy in the mail If you have any lab test that is abnormal or we need to change your treatment, we will call you to review the results.  Testing/Procedures: NONE ordered at this time of appointment   Follow-Up: At [facility], you and your health needs are our priority.  As part of our continuing mission to provide you with exceptional heart care, we have created designated Provider Care Teams.  These Care Teams include your primary Cardiologist (physician) and Advanced Practice Providers (APPs -  Physician Assistants and Nurse Practitioners) who all work together to provide you with the care you need, when you need it.   Your next appointment:   Follow up as scheduled    The format for your next appointment:   In Person  Provider:   [name], MD  Other Instructions Eat foods high in protein     Protein Content in Foods  Generally, most healthy people need around 50 grams of protein each day. Depending on your overall health, you may need more or less protein in your diet. Talk to your health care provider or dietitian about how much protein you need. See the following list for the protein content of some common foods. High-protein foods High-protein foods contain 4 grams (4 g) or more of protein per serving. They include: Beef, ground sirloin (cooked) -- 3 oz have 24 g of protein. Cheese (hard) -- 1 oz has 7 g of protein. Chicken breast, boneless and skinless (cooked) -- 3 oz have 13.4 g of  protein. Cottage cheese -- 1/2 cup has 13.4 g of protein. Egg -- 1 egg has 6 g of protein. Fish, filet (cooked) -- 1 oz has 6-7 g of protein. Garbanzo beans (canned or cooked) -- 1/2 cup has 6-7 g of protein. Kidney beans (canned or cooked) -- 1/2 cup has 6-7 g of protein. Lamb (cooked) -- 3 oz has 24 g of protein. Milk -- 1 cup (8 oz) has 8 g of protein. Nuts (peanuts, pistachios, almonds) -- 1 oz has 6 g of protein. Peanut butter -- 1 oz has 7-8 g of protein. Pork tenderloin (cooked) -- 3 oz has 18.4 g of protein. Pumpkin seeds -- 1 oz has 8.5 g of protein. Soybeans (roasted) -- 1 oz has 8 g of protein. Soybeans (cooked) -- 1/2 cup has 11 g of protein. Soy milk -- 1 cup (8 oz) has 5-10 g of protein. Soy or vegetable patty -- 1 patty has 11 g of protein. Sunflower seeds -- 1 oz has 5.5 g of protein. Tofu (firm) -- 1/2 cup has 20 g of protein. Tuna (canned in water) -- 3 oz has 20 g of protein. Yogurt -- 6 oz has 8 g of protein. Low-protein foods Low-protein foods contain 3 grams (3 g) or less of protein per serving. They include: Beets (raw or cooked) -- 1/2 cup has 1.5 g of protein. Bran cereal -- 1/2 cup has 2-3 g of protein. Bread --  1 slice has 2.5 g of protein. Broccoli (raw or cooked) -- 1/2 cup has 2 g of protein. Collard greens (raw or cooked) -- 1/2 cup has 2 g of protein. Corn (fresh or cooked) -- 1/2 cup has 2 g of protein. Cream cheese -- 1 oz has 2 g of protein. Creamer (half-and-half) -- 1 oz has 1 g of protein. Flour tortilla -- 1 tortilla has 2.5 g of protein Frozen yogurt -- 1/2 cup has 3 g of protein. Fruit or vegetable juice -- 1/2 cup has 1 g of protein. Green beans (raw or cooked) -- 1/2 cup has 1 g of protein. Green peas (canned) -- 1/2 cup has 3.5 g of protein. Muffins -- 1 small muffin (2 oz) has 3 g of protein. Oatmeal (cooked) -- 1/2 cup has 3 g of protein. Potato (baked with skin) -- 1 medium potato has 3 g of protein. Rice (cooked) -- 1/2 cup has  2.5-3.5 g of protein. Sour cream -- 1/2 cup has 2.5 g of protein. Spinach (cooked) -- 1/2 cup has 3 g of protein. Squash (cooked) -- 1/2 cup has 1.5 g of protein. Actual amounts of protein may be different depending on processing. Talk with your health care provider or dietitian about what foods are recommended for you. This information is not intended to replace advice given to you by your health care provider. Make sure you discuss any questions you have with your health care provider. Document Revised: 06/22/2016 Document Reviewed: 06/22/2016 Elsevier Patient Education  2020 [employer].

## 2020-08-08 LAB — BASIC METABOLIC PANEL
BUN/Creatinine Ratio: 32 — ABNORMAL HIGH (ref 10–24)
BUN: 21 mg/dL (ref 10–36)
CO2: 24 mmol/L (ref 20–29)
Calcium: 8.5 mg/dL — ABNORMAL LOW (ref 8.6–10.2)
Chloride: 101 mmol/L (ref 96–106)
Creatinine, Ser: 0.65 mg/dL — ABNORMAL LOW (ref 0.76–1.27)
GFR calc Af Amer: 98 mL/min/{1.73_m2} (ref >59)
GFR calc non Af Amer: 84 mL/min/{1.73_m2} (ref >59)
Glucose: 128 mg/dL — ABNORMAL HIGH (ref 65–99)
Potassium: 4.8 mmol/L (ref 3.5–5.2)
Sodium: 137 mmol/L (ref 134–144)

## 2020-08-08 LAB — FERRITIN: Ferritin: 22 ng/mL — ABNORMAL LOW (ref 30–400)

## 2020-08-08 LAB — CBC
Hematocrit: 33 % — ABNORMAL LOW (ref 37.5–51.0)
Hemoglobin: 10.2 g/dL — ABNORMAL LOW (ref 13.0–17.7)
MCH: 19.2 pg — ABNORMAL LOW (ref 26.6–33.0)
MCHC: 30.9 g/dL — ABNORMAL LOW (ref 31.5–35.7)
MCV: 62 fL — ABNORMAL LOW (ref 79–97)
Platelets: 206 10*3/uL (ref 150–450)
RBC: 5.3 x10E6/uL (ref 4.14–5.80)
RDW: 16.8 % — ABNORMAL HIGH (ref 11.6–15.4)
WBC: 5.6 10*3/uL (ref 3.4–10.8)

## 2020-09-09 ENCOUNTER — Other Ambulatory Visit: Payer: Self-pay | Admitting: Cardiovascular Disease

## 2020-09-09 NOTE — Telephone Encounter (Signed)
Rx has been sent to the pharmacy electronically. ° °

## 2020-09-13 ENCOUNTER — Telehealth: Payer: Self-pay | Admitting: Cardiovascular Disease

## 2020-09-13 NOTE — Telephone Encounter (Signed)
Daughter of the patient called. The patient was started on Eliquis at his last visit with [facility].  Her main concern was the cost of the medication. The Daughter went to go pick up the refill and the pharmacy wanted $127 for the medication. The Daughter wanted to know if there was any way Dr. Gwenlyn Found had a way to help with the cost of the medicine.   The Daughter also wanted to know if it is safe for the patient to get this COVID booster shot while being on blood thinners. Please advise

## 2020-09-13 NOTE — Telephone Encounter (Signed)
Returned call to daughter, informed that pt may be in the "donut hole" and that we will provide samples for pt. that are at the front desk for her to p/u. Verbalized understanding. She will call if/when they run out.

## 2020-10-02 ENCOUNTER — Other Ambulatory Visit: Payer: Self-pay

## 2020-10-02 ENCOUNTER — Encounter: Payer: Self-pay | Admitting: Cardiovascular Disease

## 2020-10-02 ENCOUNTER — Ambulatory Visit (INDEPENDENT_AMBULATORY_CARE_PROVIDER_SITE_OTHER): Payer: BC Managed Care – PPO | Admitting: Cardiovascular Disease

## 2020-10-02 VITALS — BP 122/62 | HR 77 | Ht 60.0 in | Wt 121.0 lb

## 2020-10-02 DIAGNOSIS — I10 Essential (primary) hypertension: Secondary | ICD-10-CM | POA: Diagnosis not present

## 2020-10-02 DIAGNOSIS — I5042 Chronic combined systolic (congestive) and diastolic (congestive) heart failure: Secondary | ICD-10-CM

## 2020-10-02 DIAGNOSIS — I2510 Atherosclerotic heart disease of native coronary artery without angina pectoris: Secondary | ICD-10-CM | POA: Diagnosis not present

## 2020-10-02 DIAGNOSIS — I4891 Unspecified atrial fibrillation: Secondary | ICD-10-CM

## 2020-10-02 DIAGNOSIS — Z7901 Long term (current) use of anticoagulants: Secondary | ICD-10-CM

## 2020-10-02 DIAGNOSIS — I5189 Other ill-defined heart diseases: Secondary | ICD-10-CM

## 2020-10-02 DIAGNOSIS — Z951 Presence of aortocoronary bypass graft: Secondary | ICD-10-CM | POA: Diagnosis not present

## 2020-10-02 DIAGNOSIS — E785 Hyperlipidemia, unspecified: Secondary | ICD-10-CM

## 2020-10-02 DIAGNOSIS — R600 Localized edema: Secondary | ICD-10-CM

## 2020-10-02 MED ORDER — FUROSEMIDE 20 MG PO TABS: 20.0000 mg | ORAL_TABLET | Freq: Every day | ORAL | 0 refills | Status: DC

## 2020-10-02 NOTE — Progress Notes (Signed)
Patient ID: Dakota [name], male   DOB: 1926-08-07, 86 y.o.   MRN: [id number]    Primary M.D.: Dr. Delfino Lovett Tisovic  HPI: Dakota [name] is a 86 y.o. male is a former patient of Dr. Mare Ferrari.  He presents for a 15 month follow-up evaluation.  [name] has CAD and underwent CABG revascularization surgery in 2005 with a LIMA to the LAD, SVG to the diagonal, SVG to the obtuse marginal, and SVG to his distal RCA.  He has been demonstrated to have combined systolic and diastolic heart failure, left bundle branch block and diabetes mellitus.  In 2013.  An echo Doppler study showed an EF of 50-55% with moderate diastolic dysfunction.  In July 2016 he developed symptoms worrisome for unstable angina.  A nuclear perfusion study was abnormal and demonstrated inferior to inferolateral ischemia.  He underwent cardiac catheterization by me on 05/21/2015.  This revealed low normal global LV function with an EF of 50-55%.  There was significant native CAD with 95% distal left main stenosis, proximal occlusion of the LAD after the first 2 septal perforating arteries, total occlusion of the circumflex at the ostium, and RCA stenoses, 50% proximally, 50% mid, and total occlusion of the distal RCA after a small PDA-like vessel.  He had a patent LIMA graft supplying the mid LAD.  The vein graft supplying the circumflex marginal had a 99% distal anastomosis stenosis.  He had a patent vein graft supplying the diagonal vessel and a pain vein graft supplying the distal RCA with some collateralization of the AV groove circumflex from the PLA vessel. I performed successful intervention to the vein graft supplying the obtuse marginal vessel and a 3.016 mm Synergy DES stent postdilated to 3.43 mm was inserted with resumption of brisk TIMI-3 flow and 0 residual stenosis.  This procedure was complicated by a small pseudoaneurysm which essentially self thrombosed and he did not require any thrombin injection.  He last saw Dr. Mare Ferrari in  January 2017.   He established cardiology care with me in the office setting on 03/26/2016.  He denies any episodes of chest pain.  The patient has remained fairly active.  He still works at [employer] 4 days per week for 8 hours shifts.  He was treated for bronchitis.  He has noticed some mild shortness of breath with activity. He is unaware of palpitations.  He denies significant leg swelling.  He has not had recent blood work.   He underwent a repeat echo Doppler study on 11/04/2016.  This demonstrated normal systolic function with an EF of 50-55% with mild LVH and no regional wall motion abnormalities.  He had grade 2 diastolic dysfunction.  There was moderate aortic valve sclerosis without stenosis, moderate left atrial dilatation, mild right atrial dilatation, and mild pulmonary hypertension with a PA estimated pressure 36 mm.  6 months, he continues to do well.  He again denies recurrent anginal type symptoms.  I reviewed lab work done by Dr. Rosana Hoes on December 07, 2016.  Total cholesterol was 124, triglycerides 65, HDL 46, and LDL 65.  Glucose was 101.  Creatinine was 0.9.  When I last saw him in February 2019 he was continuing to remain stable from a cardiovascular standpoint. He was taken off Amaryl by Dr. Rosana Hoes.  Subsequent blood work  had shown his glucose increased at 141.  He was mildly anemic with a hemoglobin of 10.9, hematocrit 36.3.  Lipid studies were excellent with a total cholesterol 114, HDL 51, triglycerides 93,  LDL 45.[name]  He continues to work at [employer] from 9 AM until 6 PM on Thursday, Friday, Saturday, and Sundays.    He was hospitalized on June 15 and discharged April 11, 2018 with left-sided chest pain not nitrate responsive.  He was subsequently seen by Jory Sims, NP in the office on April 21, 2018 and there was some confusion concerning his medications.  At time losartan was discontinued and he was started on low-dose nitrate therapy.  I last saw him in September 2020.   Over the prior several months he had felt well.  He was taking furosemide 40 mg every other day isosorbide at dinnertime and icontinued to be on aspirin and Plavix.  He is on pravastatin 80 mg at bedtime.  He has noticed recently his blood pressure has been more increased.  He was no longer on the losartan.  At that time, his ECG showed sinus bradycardia with first-degree block and then transition into PAF with ventricular rate at 50.  He has chronic left bundle branch block.  I scheduled him for follow-up echo Doppler study.  I have not seen him since September 2020.  He was seen in follow-up on August 07, 2019 by Coletta Memos, NP.  His echo Doppler study from September 2020 showed an EF of 50 to 55%.  There was mild TR, aortic valve calcification with mild AR but no stenosis.  Apparently, in April 2021 there was a concern of possible TIA where his right arm suddenly did not work well with complete normalization.  He was last evaluated on July 31, 2020 by Rosaria Ferries, PA at that time, he denied any recurrent chest pain.  He denied any awareness of palpitations or heart rate irregularity.  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  However now he is able to sit in a chair and set his stand up all day.  He has noticed some mild ankle swelling bilaterally.  He denies chest pain.  He has continued to be on amlodipine 2.5 mg, furosemide 20 mg every other day, isosorbide 30 mg daily, and has been without anginal symptoms.  He is on pravastatin 80 mg for hyperlipidemia.  He is on low-dose Eliquis 2.5 mg twice a day for anticoagulation.  He is on pantoprazole for GERD.  He presents for evaluation.  Past Medical History:  Diagnosis Date  . Aortic atherosclerosis ([place])   . Arthritis   . Benign localized prostatic hyperplasia with lower urinary tract symptoms (LUTS)   . Chronic chest pain    takes imdur  . Chronic  combined systolic (congestive) and diastolic (congestive) heart failure ([place])    followed by cardiology  . Chronic dyspnea   . Cognitive impairment   . COPD, moderate ([place])   . Edema of both lower extremities    takes lasix and uses teds  . First degree heart block   . GERD (gastroesophageal reflux disease)   . Heart murmur   . History of cardiovascular stress test    Lexiscan Myoview 7/16:  EF 51%, inferior, inferolateral ischemia; Intermediate Risk  . History of smallpox   . HOH (hard of hearing)    even with hearing aids  . Hypertension   . Ischemic heart disease 2005   Dr [name] a. s/p CABG;  b.  LHC 05/23/15:  S-RPAVB ok, S-D2 ok, S-OM2 99% (s/p Synergy DES), L-LAD ok,  EF normal (complicated by pseudoaneurysm)   . LBBB (left bundle branch block)   . Right arm weakness   . S/P CABG x 4    01-08-2004  . S/P drug eluting coronary stent placement    05-21-2015  PCI and DES x1 to SVG--OM graft  . Thalassemia minor   . Type 2 diabetes, diet controlled ([place])   . Wears glasses   . Wears hearing aid in both ears     Past Surgical History:  Procedure Laterality Date  . APPENDECTOMY    . CARDIAC CATHETERIZATION  1989   in [place]   normal    . CARDIAC CATHETERIZATION N/A 05/23/2015   Procedure: Left Heart Cath and Cors/Grafts Angiography;  Surgeon: Troy Sine, MD;  Location: [place] [facility];  Service: Cardiovascular;  Laterality: N/A;  . CARDIAC CATHETERIZATION N/A 05/23/2015   Procedure: Coronary Stent Intervention;  Surgeon: Troy Sine, MD;  Location: [place] [facility];  Service: Cardiovascular;  Laterality: N/A;  . CARDIAC CATHETERIZATION  01-04-2004   dr Cathie Olden  _0    severe 3V CAD, normal LVF  . CATARACT EXTRACTION W/ INTRAOCULAR LENS  IMPLANT, BILATERAL    . CORONARY ARTERY BYPASS GRAFT  01-08-2004  dr hendrickson _1    LIMA to LAD,  SVG to D1,  SVG to OM,  SVG to PDA  . FOOT SURGERY Right   . INGUINAL HERNIA REPAIR Bilateral 1978  . MASS EXCISION Left  11/20/2019   Procedure: EXCISION OF SEBACEOUS CYST CHEST;  Surgeon: Kieth Brightly, Arta Bruce, MD;  Location: [place];  Service: General;  Laterality: Left;    Allergies  Allergen Reactions  . Aceon [Perindopril Erbumine] Shortness Of Breath and Cough  . Sulfa Antibiotics Swelling  . Pork-Derived Products     muslim    Current Outpatient Medications  Medication Sig Dispense Refill  . albuterol (PROVENTIL HFA;VENTOLIN HFA) 108 (90 Base) MCG/ACT inhaler Inhale 2 puffs into the lungs every 4 (four) hours as needed for wheezing or shortness of breath. 1 Inhaler 0  . amLODipine (NORVASC) 2.5 MG tablet Take 1 tablet (2.5 mg total) by mouth daily. 90 tablet 1  . apixaban (ELIQUIS) 2.5 MG TABS tablet Take 1 tablet (2.5 mg total) by mouth 2 (two) times daily. 60 tablet 0  . docusate sodium (COLACE) 100 MG capsule Take 100 mg by mouth at bedtime.    . finasteride (PROSCAR) 5 MG tablet Take 5 mg by mouth at bedtime.     . furosemide (LASIX) 20 MG tablet Take 1 tablet (20 mg total) by mouth daily. 15 tablet 0  . isosorbide mononitrate (IMDUR) 30 MG 24 hr tablet Take 1 tablet (30 mg total) by mouth daily. 90 tablet 3  . mirabegron ER (MYRBETRIQ) 50 MG TB24 tablet Take 50 mg by mouth at bedtime.     . Multiple Vitamin (MULTIVITAMIN WITH MINERALS) TABS tablet Take 1 tablet by mouth daily.    . Multiple Vitamins-Minerals (PRESERVISION AREDS 2 PO) Take 1 tablet by mouth 2 (two) times daily.     [name] NITROSTAT 0.4 MG SL tablet PLACE 1 TABLET UNDER THE TONGUE EVERY 5 MINUTES AS NEEDED FOR CHEST PAIN (Patient taking differently: Place 0.4 mg under the tongue every 5 (five) minutes as needed. ) 25 tablet 1  . ONE TOUCH ULTRA TEST test strip 1 each by Other route as needed (glucose monoring).     . pantoprazole (PROTONIX) 40 MG tablet Take 1 tablet (40 mg total) by mouth  daily. (Patient taking differently: Take 40 mg by mouth daily. ) 90 tablet 3  . pravastatin (PRAVACHOL) 80 MG tablet Take 80 mg by  mouth at bedtime.     . terbinafine (LAMISIL) 250 MG tablet Take 250 mg by mouth daily.     Current Facility-Administered Medications  Medication Dose Route Frequency Provider Last Rate Last Admin  . SOLUTION-PLUS STOPCOCK MISC   Does not apply Once Lorretta Harp, MD        Social History   Socioeconomic History  . Marital status: Married    Spouse name: Not on file  . Number of children: 2 d  . Years of education: Not on file  . Highest education level: Not on file  Occupational History  . Occupation: accountant    Comment: [employer]  Tobacco Use  . Smoking status: Never Smoker  . Smokeless tobacco: Never Used  Vaping Use  . Vaping Use: Never used  Substance and Sexual Activity  . Alcohol use: No    Alcohol/week: 0.0 standard drinks  . Drug use: No  . Sexual activity: Not on file  Other Topics Concern  . Not on file  Social History Narrative  . Not on file   Social Determinants of Health   Financial Resource Strain: Not on file  Food Insecurity: Not on file  Transportation Needs: Not on file  Physical Activity: Not on file  Stress: Not on file  Social Connections: Not on file  Intimate Partner Violence: Not on file    Additional social history is notable in that he had worked at the [place] [employer] for 30 years in the [place].  Family History  Problem Relation Age of Onset  . Healthy Father   . Ulcers Mother   . Stroke Mother    Both parents are deceased.  ROS General: Negative; No fevers, chills, or night sweats HEENT: Uses a hearing aid; no visual changes sinus congestion, difficulty swallowing Pulmonary: Positive for remote bronchitis Cardiovascular: See HPI: No chest pain, presyncope, syncope, palpatations GI: Negative; No nausea, vomiting, diarrhea, or abdominal pain GU: Negative; No dysuria, hematuria, or difficulty voiding Musculoskeletal: Negative; no myalgias, joint pain, or weakness Hematologic: Negative; no easy  bruising, bleeding Endocrine: Positive for diabetes mellitus Neuro: Question transient TIA April 2021 Skin: Negative; No rashes or skin lesions Psychiatric: Negative; No behavioral problems, depression Sleep: Negative; No snoring,  daytime sleepiness, hypersomnolence, bruxism, restless legs, hypnogognic hallucinations. Other comprehensive 14 point system review is negative   Physical Exam BP 122/62   Pulse 77   Ht 5' (1.524 m)   Wt 121 lb (54.9 kg)   SpO2 98%   BMI 23.63 kg/m    Repeat blood pressure by me 118/64  Wt Readings from Last 3 Encounters:  10/02/20 121 lb (54.9 kg)  07/31/20 111 lb 6.4 oz (50.5 kg)  11/20/19 110 lb 14.3 oz (50.3 kg)   General: Alert, oriented, no distress.  Skin: normal turgor, no rashes, warm and dry HEENT: Normocephalic, atraumatic. Pupils equal round and reactive to light; sclera anicteric; extraocular muscles intact;  Nose without nasal septal hypertrophy Mouth/Parynx benign; Mallinpatti scale 2 Neck: No JVD, no carotid bruits; normal carotid upstroke Lungs: clear to ausculatation and percussion; no wheezing or rales Chest wall: without tenderness to palpitation Heart: PMI not displaced, RRR, s1 s2 normal, 1/6 systolic murmur, no diastolic murmur, no rubs, gallops, thrills, or heaves Abdomen: soft, nontender; no hepatosplenomehaly, BS+; abdominal aorta nontender and not dilated by palpation. Back:  no CVA tenderness Pulses 2+ Musculoskeletal: full range of motion, normal strength, no joint deformities Extremities: Brawny edema bilaterally, 1-2+; no clubbing cyanosis, Homan's sign negative  Neurologic: grossly nonfocal; Cranial nerves grossly wnl Psychologic: Normal mood and affect   ECG (independently read by me): Atrial fibrillation at 77, left bundle branch block  October 2020 ECG (independently read by me): Sinus bradycardia with first-degree AV block.  Nonspecific intraventricular block.  First-degree AV block.  February 2019 ECG  (independently read by me): Sinus bradycardia 56 bpm.  First-degree AV block with a PR interval at 242 ms.  Left bundle branch block.  May 2018 ECG (independently read by me): Sinus bradycardia at 59 bpm with PACs and sinus arrhythmia.  Left axis deviation.  Left bundle branch block with repolarization changes.  First-degree AV block with a PR interval at 288 ms.  QTc interval 449 ms.  November 2017 ECG (independently read by me): Probable sinus rhythm with interventricular conduction delay.  Heart rate 58 bpm.  June 2017 ECG (independently read by me): Normal sinus rhythm at 72 bpm with first-degree AV block.  PR interval 234 ms.  LABS:  BMP Latest Ref Rng & Units 08/07/2020 11/20/2019 07/17/2019  Glucose 65 - 99 mg/dL 128(H) 107(H) 142(H)  BUN 10 - 36 mg/dL _0 Creatinine 0.76 - 1.27 mg/dL 0.65(L) 0.50(L) 0.74(L)  BUN/Creat Ratio 10 - 24 32(H) - 38(H)  Sodium 134 - 144 mmol/L 137 145 136  Potassium 3.5 - 5.2 mmol/L 4.8 3.9 4.7  Chloride 96 - 106 mmol/L 101 99 98  CO2 20 - 29 mmol/L 24 - 24  Calcium 8.6 - 10.2 mg/dL 8.5(L) - 9.1     Hepatic Function Latest Ref Rng & Units 02/24/2019 07/20/2017 09/07/2016  Total Protein 6.0 - 8.5 g/dL 6.6 6.6 6.4  Albumin 3.5 - 4.6 g/dL 4.4 - 3.9  AST 0 - 40 IU/L _1 ALT 0 - 44 IU/L _2 Alk Phosphatase 39 - 117 IU/L 71 - 58  Total Bilirubin 0.0 - 1.2 mg/dL 0.7 0.7 0.7  Bilirubin, Direct 0.0 - 0.3 mg/dL - - -    CBC Latest Ref Rng & Units 08/07/2020 11/20/2019 02/24/2019  WBC 3.4 - 10.8 x10E3/uL 5.6 - 7.1  Hemoglobin 13.0 - 17.7 g/dL 10.2(L) 10.9(L) 10.2(L)  Hematocrit 37.5 - 51.0 % 33.0(L) 32.0(L) 34.0(L)  Platelets 150 - 450 x10E3/uL 206 - 226   Lab Results  Component Value Date   MCV 62 (L) 08/07/2020   MCV 62 (L) 02/24/2019   MCV 61.0 (L) 04/10/2018    Lab Results  Component Value Date   TSH 0.955 07/17/2019    BNP No results found for: BNP  ProBNP    Component Value Date/Time   PROBNP 137.0 (H) 06/10/2015 1504      Lipid Panel     Component Value Date/Time   CHOL 126 02/24/2019 1058   TRIG 93 02/24/2019 1058   HDL 55 02/24/2019 1058   CHOLHDL 2.3 02/24/2019 1058   CHOLHDL 2.2 07/20/2017 0949   VLDL 10 09/07/2016 1027   LDLCALC 52 02/24/2019 1058   LDLCALC 45 07/20/2017 0949     RADIOLOGY: No results found.  IMPRESSION:  1. Coronary artery disease involving native coronary artery of native heart without angina pectoris   2. Hx of CABG   3. Essential hypertension   4. Atrial fibrillation, unspecified type ([place])   5. Anticoagulation adequate   6. Hyperlipidemia with target LDL less than  70   7. Lower extremity edema   8. Grade II diastolic dysfunction     ASSESSMENT AND PLAN: [name] is a 86 year-old gentleman who underwent CABG surgery in 2005.  He developed  unstable angina in July 2016 was found to have a 99% stenosis in the vein graft supplying the circumflex marginal vessel just at the distal anastomosis.  This was successfully stented.  He has not had any recurrent anginal therapy since and continues to feel well.  An echo Doppler study on 11/04/2016  was done when he experienced some shortness of breath after an episode of bronchitis revealed mild LVH with EF of 50-55% and grade 2 diastolic dysfunction.  There was aortic valve sclerosis without stenosis, with biatrial enlargement and mild pulmonary hypertension with a PA pressure peaked 36 mm.  I had seen him in 2019, his blood pressure was elevated and I recommended titration of his losartan.  Apparently, ultimately his losartan was discontinued when he was started on nitrate therapy.  His ECG shows atrial fibrillation with controlled ventricular rate.  He has been on Eliquis low-dose for anticoagulation.  There was some concern that he may have suffered a TIA in April 2021 when he developed transient right arm weakness and transient inability to write which ultimately resolved.  He is not having any anginal symptoms.  His blood  pressure today is stable on amlodipine 2.5 mg, furosemide 20 mg every other day, isosorbide 30 mg daily.  However he does have 1-2+ brawny lower extremity edema.  I have suggested he increase his Lasix to 20 mg on a daily basis and use support stockings.  He is on pravastatin 80 mg for hyperlipidemia.  LDL cholesterol in May 2020 when checked by Dr. Osborne Casco was 85.  Dr. Osborne Casco will be checking laboratory.  Despite his age, he continues to work 4 days/week at [employer].  Will follow up with Dr. Osborne Casco.  I will see him in 6 months for cardiology reevaluation.   Troy Sine, MD, [facility]  10/05/2020 2:37 PM

## 2020-10-02 NOTE — Patient Instructions (Signed)
Medication Instructions:  INCREASE your lasix to 20 mg once a day *If you need a refill on your cardiac medications before your next appointment, please call your pharmacy*   Lab Work: None ordered If you have labs (blood work) drawn today and your tests are completely normal, you will receive your results only by: [name] MyChart Message (if you have MyChart) OR . A paper copy in the mail If you have any lab test that is abnormal or we need to change your treatment, we will call you to review the results.   Testing/Procedures: None ordered   Follow-Up: At [facility], you and your health needs are our priority.  As part of our continuing mission to provide you with exceptional heart care, we have created designated Provider Care Teams.  These Care Teams include your primary Cardiologist (physician) and Advanced Practice Providers (APPs -  Physician Assistants and Nurse Practitioners) who all work together to provide you with the care you need, when you need it.  We recommend signing up for the patient portal called "MyChart".  Sign up information is provided on this After Visit Summary.  MyChart is used to connect with patients for Virtual Visits (Telemedicine).  Patients are able to view lab/test results, encounter notes, upcoming appointments, etc.  Non-urgent messages can be sent to your provider as well.   To learn more about what you can do with MyChart, go to [email or web address].    Your next appointment:   6 month(s)  The format for your next appointment:   In Person  Provider:   [name], MD   Other Instructions Purchase some below-the-knee compression stockings (20 mmHg pressure) to manage swelling.

## 2020-10-05 ENCOUNTER — Encounter: Payer: Self-pay | Admitting: Cardiovascular Disease

## 2020-10-30 ENCOUNTER — Telehealth: Payer: Self-pay | Admitting: Cardiovascular Disease

## 2020-10-30 NOTE — Telephone Encounter (Signed)
Spoke with patients daughter Molli Knock per [employer]) who states that patient is almost out of Eliquis and they can not afford the prescription at this time. Advised patients daughter that patient assistance forms and samples of Eliquis will be left at the front desk for pick up. Advised patients daughter to fill out patient portion of assistance forms and return to the office and that we can fax the forms once received. Patients daughter verbalized understanding and will pick up patient assistance forms and samples today.   Samples of Eliquis 2.5mg  were left at front desk for pick for the patient, quantity 3 Boxes, Lot Number [id number] EXP: 1/23  Patients daughter also states that she called and gave patients information on upcoming dental procedure. Patients daughter states that patient will need to know whether he should take the Eliquis or hold for procedure. Clearance is in, advised patients daughter that I will forward message to Pre Op for directions on Eliquis instructions for dental procedure. Patients daughter verbalized understanding.

## 2020-10-30 NOTE — Telephone Encounter (Signed)
Spoke with patients daughter Molli Knock Per DPR), made her aware that patient will not need to hold Eliquis for upcoming dental procedure per Corine Shelter PA-C. Advised patients daughter that clearance was faxed to patients dentist as well. Patients daughter verbalized understanding of all instructions.

## 2020-10-30 NOTE — Telephone Encounter (Signed)
° ° ° ° °  Patient calling the office for samples of medication:   1.  What medication and dosage are you requesting samples for?   apixaban (ELIQUIS) 2.5 MG TABS tablet    2.  Are you currently out of this medication? Almost  Pt's daughter said pt needs samples for eliquis since it will cost them $127 and pt can't afford it. She said the copay card Dr. Tresa Endo gave to them expired

## 2020-10-30 NOTE — Telephone Encounter (Signed)
I faxed clearance to the patient's dentist- it sounds like a minor procedure, he does not need to hold Eliquis for this.  Corine Shelter PA-C 10/30/2020 12:01 PM

## 2020-10-30 NOTE — Telephone Encounter (Signed)
   Primary Cardiologist: Nicki Guadalajara, MD  Chart reviewed as part of pre-operative protocol coverage.    Simple dental extractions and procedures are considered low risk per guidelines and generally do not require any specific cardiac clearance. It is also generally accepted that for simple extractions and dental cleanings, there is no need to interrupt blood thinner therapy.   SBE prophylaxis is not required for the patient from a cardiac standpoint.  I will route this recommendation to the requesting party via Epic fax function and remove from pre-op pool.  Please call with questions.  Corine Shelter, PA-C 10/30/2020, 11:22 AM

## 2020-10-30 NOTE — Telephone Encounter (Signed)
   [facility] Pre-operative Risk Assessment    HEARTCARE STAFF: - Please ensure there is not already an duplicate clearance open for this procedure. - Under Visit Info/Reason for Call, type in Other and utilize the format Clearance MM/DD/YY or Clearance TBD. Do not use dashes or single digits. - If request is for dental extraction, please clarify the # of teeth to be extracted.  Request for surgical clearance:  1. What type of surgery is being performed? Teeth added to partial that is broken   2. When is this surgery scheduled? 11/05/20  3. What type of clearance is required (medical clearance vs. Pharmacy clearance to hold med vs. Both)? both 4.  5. Are there any medications that need to be held prior to surgery and how long? Up to our office  6. Practice name and name of physician performing surgery? Dr. Maximiano Coss office - Dr. Magnus Ivan  7. What is the office phone number? [phone]   7.   What is the office fax number? [phone]  8.   Anesthesia type (None, local, MAC, general) ? none   Dakota [name] 10/30/2020, 11:11 AM  _________________________________________________________________   (provider comments below)

## 2020-11-20 ENCOUNTER — Telehealth: Payer: Self-pay | Admitting: Cardiovascular Disease

## 2020-11-20 NOTE — Telephone Encounter (Signed)
Spoke with the patients daughter Bobbie Stack. Nadeema sts that the patient did not take morning dose of Eliquis due to a nose bleed. They were able to control the bleeding. The patient applied ice and after 10-15 minutes it resolved. Patient denies any other signs of bleeding.  Adv the [facility] to have the patient resume Eliquis this evening as prescribed. If a nose bleed reoccurs she is not to have the patient lay down or tilt his head back. He should sit up and apply direct pressure for 10-15 minutes. If the nose bleed persist he should be taken to an urgent care for packing/cauterizing. She is to contact the office if nose bleeds persist or any other signs of bleeding develop.    Nadeema sts that they were given an application for patient assistance for Eliquis. They need assistance completing the patient portion of the application. The patient is available on Mon, Tues, Wed. Westley Foots that I will fwd the msg to Dr. Evette Georges covering nurse to f/u with them about a good time for them to come to the office.

## 2020-11-20 NOTE — Telephone Encounter (Signed)
Pt c/o medication issue:  1. Name of Medication:  apixaban (ELIQUIS) 2.5 MG TABS tablet(Expired)  2. How are you currently taking this medication (dosage and times per day)?  Twice daily, once in the morning and once in the evening   3. Are you having a reaction (difficulty breathing--STAT)?  No difficulty breathing, unsure if reaction to medication   4. What is your medication issue?  Daughter Bobbie Stack called on behalf of patient. She states patient's nose started bleeding this morning and bled for around 10-15 minutes. She states she had him lay back and she put ice on it - she did not give him the morning dose of eliquis yet. She wants to know if he should still take both doses today. The bleeding has since stopped. Please call/advise  Thank you!

## 2020-11-20 NOTE — Telephone Encounter (Signed)
Returned the patients daughters call. Lmtcb for [employer].

## 2020-11-21 ENCOUNTER — Telehealth: Payer: Self-pay | Admitting: Cardiovascular Disease

## 2020-11-21 ENCOUNTER — Encounter: Payer: Self-pay | Admitting: Cardiology

## 2020-11-21 ENCOUNTER — Other Ambulatory Visit: Payer: Self-pay

## 2020-11-21 ENCOUNTER — Ambulatory Visit (INDEPENDENT_AMBULATORY_CARE_PROVIDER_SITE_OTHER): Payer: BC Managed Care – PPO | Admitting: Cardiology

## 2020-11-21 VITALS — BP 110/65 | HR 95 | Ht 63.0 in | Wt 126.0 lb

## 2020-11-21 DIAGNOSIS — Z951 Presence of aortocoronary bypass graft: Secondary | ICD-10-CM

## 2020-11-21 DIAGNOSIS — I4821 Permanent atrial fibrillation: Secondary | ICD-10-CM

## 2020-11-21 DIAGNOSIS — I2510 Atherosclerotic heart disease of native coronary artery without angina pectoris: Secondary | ICD-10-CM

## 2020-11-21 DIAGNOSIS — I5043 Acute on chronic combined systolic (congestive) and diastolic (congestive) heart failure: Secondary | ICD-10-CM | POA: Diagnosis not present

## 2020-11-21 MED ORDER — FUROSEMIDE 40 MG PO TABS: 40.0000 mg | ORAL_TABLET | Freq: Two times a day (BID) | ORAL | 3 refills | Status: DC

## 2020-11-21 NOTE — Progress Notes (Signed)
Cardiology Office Note   Date:  11/21/2020   ID:  Dakota [name], Dakota [name] [birth year], MRN [id number]  PCP:  [name], MD  Cardiologist:  [name] MD  Chief Complaint  Patient presents with  . Follow-up    Follow-up  . Congestive Heart Failure      History of Present Illness: Dakota [name] is a 87 y.o. male who is seen as a walk in for CHF. He is a patient of Dr [name]. History of CAD s/p CABG in 2005. S/p PCI with stenting of SVG to OM in 2016. He has chronic diastolic CHF. EF 50-55% by Echo in September 2020. He has persistent Afib and is on Eliquis.  History of HTN, HLD, chronic LBBB, and DM type 2. Was last seen by Dr [name] on 10/02/20. Was doing well at that time - still working at [employer] as a Tourist information centre manager. He was noted to have increased LE edema and it was recommended that he increase lasix to 20 mg daily.  Patient is seen with his daughter. He reports increased dyspnea and edema for several weeks. Has cough with some yellow phlegm production. Has taken 40 mg lasix and this makes him urinate more but no improvement in edema. Seen by primary care today. CXR reported showing bilateral pleural effusions. Started on Levaquin for possible PNA. Labs drawn including CBC, BMET and BNP but results pending.  Past Medical History:  Diagnosis Date  . Aortic atherosclerosis ([place])   . Arthritis   . Benign localized prostatic hyperplasia with lower urinary tract symptoms (LUTS)   . Chronic chest pain    takes imdur  . Chronic combined systolic (congestive) and diastolic (congestive) heart failure ([place])    followed by cardiology  . Chronic dyspnea   . Cognitive impairment   . COPD, moderate ([place])   . Edema of both lower extremities    takes lasix and uses teds  . First degree heart block   . GERD (gastroesophageal reflux disease)   . Heart murmur   . History of cardiovascular stress test    Lexiscan Myoview 7/16:  EF 51%, inferior, inferolateral ischemia; Intermediate Risk  .  History of smallpox   . HOH (hard of hearing)    even with hearing aids  . Hypertension   . Ischemic heart disease 2005   Dr [name] a. s/p CABG;  b.  LHC 05/23/15:  S-RPAVB ok, S-D2 ok, S-OM2 99% (s/p Synergy DES), L-LAD ok, EF normal (complicated by pseudoaneurysm)   . LBBB (left bundle branch block)   . Right arm weakness   . S/P CABG x 4    01-08-2004  . S/P drug eluting coronary stent placement    05-21-2015  PCI and DES x1 to SVG--OM graft  . Thalassemia minor   . Type 2 diabetes, diet controlled ([place])   . Wears glasses   . Wears hearing aid in both ears     Past Surgical History:  Procedure Laterality Date  . APPENDECTOMY    . CARDIAC CATHETERIZATION  1989   in [place]   normal    . CARDIAC CATHETERIZATION N/A 05/23/2015   Procedure: Left Heart Cath and Cors/Grafts Angiography;  Surgeon: Troy Sine, MD;  Location: [facility];  Service: Cardiovascular;  Laterality: N/A;  . CARDIAC CATHETERIZATION N/A 05/23/2015   Procedure: Coronary Stent Intervention;  Surgeon: Troy Sine, MD;  Location: [facility];  Service: Cardiovascular;  Laterality: N/A;  . CARDIAC  CATHETERIZATION  01-04-2004   dr Cathie Olden  @[facility]    severe 3V CAD, normal LVF  . CATARACT EXTRACTION W/ INTRAOCULAR LENS  IMPLANT, BILATERAL    . CORONARY ARTERY BYPASS GRAFT  01-08-2004  dr hendrickson @[facility]    LIMA to LAD,  SVG to D1,  SVG to OM,  SVG to PDA  . FOOT SURGERY Right   . INGUINAL HERNIA REPAIR Bilateral 1978  . MASS EXCISION Left 11/20/2019   Procedure: EXCISION OF SEBACEOUS CYST CHEST;  Surgeon: Kieth Brightly, Arta Bruce, MD;  Location: [facility];  Service: General;  Laterality: Left;     Current Outpatient Medications  Medication Sig Dispense Refill  . albuterol (PROVENTIL HFA;VENTOLIN HFA) 108 (90 Base) MCG/ACT inhaler Inhale 2 puffs into the lungs every 4 (four) hours as needed for wheezing or shortness of breath. 1 Inhaler 0  . amLODipine (NORVASC) 2.5 MG tablet Take 1  tablet (2.5 mg total) by mouth daily. 90 tablet 1  . docusate sodium (COLACE) 100 MG capsule Take 100 mg by mouth at bedtime.    . finasteride (PROSCAR) 5 MG tablet Take 5 mg by mouth at bedtime.     . isosorbide mononitrate (IMDUR) 30 MG 24 hr tablet Take 1 tablet (30 mg total) by mouth daily. 90 tablet 3  . mirabegron ER (MYRBETRIQ) 50 MG TB24 tablet Take 50 mg by mouth at bedtime.     . Multiple Vitamin (MULTIVITAMIN WITH MINERALS) TABS tablet Take 1 tablet by mouth daily.    . Multiple Vitamins-Minerals (PRESERVISION AREDS 2 PO) Take 1 tablet by mouth 2 (two) times daily.     [name] NITROSTAT 0.4 MG SL tablet PLACE 1 TABLET UNDER THE TONGUE EVERY 5 MINUTES AS NEEDED FOR CHEST PAIN (Patient taking differently: Place 0.4 mg under the tongue every 5 (five) minutes as needed.) 25 tablet 1  . ONE TOUCH ULTRA TEST test strip 1 each by Other route as needed (glucose monoring).     . pantoprazole (PROTONIX) 40 MG tablet Take 1 tablet (40 mg total) by mouth daily. (Patient taking differently: Take 40 mg by mouth daily.) 90 tablet 3  . pravastatin (PRAVACHOL) 80 MG tablet Take 80 mg by mouth at bedtime.    . terbinafine (LAMISIL) 250 MG tablet Take 250 mg by mouth daily.    [name] apixaban (ELIQUIS) 2.5 MG TABS tablet Take 1 tablet (2.5 mg total) by mouth 2 (two) times daily. 60 tablet 0   Current Facility-Administered Medications  Medication Dose Route Frequency Provider Last Rate Last Admin  . SOLUTION-PLUS STOPCOCK MISC   Does not apply Once Lorretta Harp, MD        Allergies:   Frederic Jericho [perindopril erbumine], Sulfa antibiotics, and Pork-derived products    Social History:  The patient  reports that he has never smoked. He has never used smokeless tobacco. He reports that he does not drink alcohol and does not use drugs.   Family History:  The patient's family history includes Healthy in his father; Stroke in his mother; Ulcers in his mother.    ROS:  Please see the history of present illness.    Otherwise, review of systems are positive for none.   All other systems are reviewed and negative.    PHYSICAL EXAM: VS:  BP 110/65 (BP Location: Left Arm)   Pulse 95   Ht 5\' 3"  (1.6 m)   Wt 126 lb (57.2 kg)   SpO2 95%   BMI 22.32 kg/m  , BMI Body mass  index is 22.32 kg/m. GEN: elderly, thin male, in no acute distress  HEENT: normal Neck: JVD to 8 cm, no carotid bruits, or masses Cardiac: IRRR; no murmurs, rubs, or gallops,no edema  Respiratory: diminished BS in both bases 1/2 way up with dullness to percussion. SOB with talking.  GI: soft, nontender, nondistended, + BS MS: no deformity or atrophy, 2+ pitting edema Skin: warm and dry, no rash Neuro:  Strength and sensation are intact Psych: euthymic mood, full affect   EKG:  EKG is not ordered today. The ekg ordered today demonstrates N/A   Recent Labs: 08/07/2020: BUN 21; Creatinine, Ser 0.65; Hemoglobin 10.2; Platelets 206; Potassium 4.8; Sodium 137    Lipid Panel    Component Value Date/Time   CHOL 126 02/24/2019 1058   TRIG 93 02/24/2019 1058   HDL 55 02/24/2019 1058   CHOLHDL 2.3 02/24/2019 1058   CHOLHDL 2.2 07/20/2017 0949   VLDL 10 09/07/2016 1027   LDLCALC 52 02/24/2019 1058   LDLCALC 45 07/20/2017 0949      Wt Readings from Last 3 Encounters:  11/21/20 126 lb (57.2 kg)  10/02/20 121 lb (54.9 kg)  07/31/20 111 lb 6.4 oz (50.5 kg)      Other studies Reviewed: Additional studies/ records that were reviewed today include:   Echo 07/19/19: IMPRESSIONS    1. Left ventricular ejection fraction, by visual estimation, is 50 to  55%. The left ventricle has normal function. There is no left ventricular  hypertrophy.  2. Left ventricular diastolic Doppler parameters are consistent with  impaired relaxation pattern of LV diastolic filling.  3. Global right ventricle has normal systolic function.The right  ventricular size is normal. No increase in right ventricular wall  thickness.  4. Left atrial  size was mildly dilated.  5. Right atrial size was normal.  6. Moderate calcification of the mitral valve leaflet(s).  7. Moderate thickening of the mitral valve leaflet(s).  8. Severe aortic valve annular calcification.  9. The mitral valve is abnormal. Trace mitral valve regurgitation.  10. The tricuspid valve is grossly normal. Tricuspid valve regurgitation  is mild.  11. The aortic valve has an indeterminant number of cusps Aortic valve  regurgitation was not visualized by color flow Doppler.  12. The aortic valve is heavily calcified. The leaflet mobility appears to  be reduced but there was not a significant AV gradient detected.  13. The pulmonic valve was grossly normal. Pulmonic valve regurgitation is  not visualized by color flow Doppler.  14. Mildly elevated pulmonary artery systolic pressure.  15. The atrial septum is grossly normal.    ASSESSMENT AND PLAN:  1.  Acute on chronic combined systolic/diastolic CHF. Persistent LE edema. Bilateral pleural effusions. Recommend increasing lasix to 40 mg bid. Sodium restriction. Will continue other meds. Await results of lab tests from PCP. Will need close follow up next week to assess response. If symptoms worsen he is instructed to go to the ED. May be a candidate for thoracentesis if he doesn't respond but family does not want this.  2. CAD s/p CABG and remote stent to SVG to OM. No active angina 3. AFib permanent. Rate controlled. On Eliquis.   Current medicines are reviewed at length with the patient today.  The patient does not have concerns regarding medicines.  The following changes have been made:  See above  Labs/ tests ordered today include: No orders of the defined types were placed in this encounter.    Disposition:   FU  with Dr [name]  in 1 week  Signed, Peter Martinique, MD  11/21/2020 1:34 PM    [facility] [address], [place], [place], [address] Phone [phone], Fax  [phone]

## 2020-11-21 NOTE — Telephone Encounter (Signed)
Please resume Eliquis with evening dose today. Keep nares moist.

## 2020-11-21 NOTE — Patient Instructions (Signed)
Increase lasix (furosemide) to 40 mg twice a day  Restrict sodium (salt) intake.   We will arrange follow up next week.

## 2020-11-21 NOTE — Telephone Encounter (Signed)
Call transferred directly to triage-  Call received from Reginold Agent, NP at [facility].   She advised the patient is currently in the office with her. He has complaints of SOB/ Cough x 2 weeks. She states he is an active 87 year old who has still been trying to work 8 hours a day and push through his symptoms. He increased his lasix to 40 mg QD on Monday & Tuesday. He took lasix 20 mg once yesterday.   He has had no real improvement in symptoms with lasix.   They have tested him for COVID and his negative.  They have done a chest xray on him this morning that showed fairly large bilateral pleural effusions.   Colletta Maryland is requesting for the patient to be seen for further management of his symptoms.   I have message Elly Modena, RN for Dr. Martinique (DOD). Per Dr. Martinique, they will see the patient at 1:00 pm today.  I have notified Colletta Maryland, NP of this appointment and she has made the patient aware. Location confirmed & advised to arrive 15 minutes early to check in.  Colletta Maryland was appreciative for the call back.

## 2020-11-22 ENCOUNTER — Telehealth: Payer: Self-pay | Admitting: Licensed Clinical Social Worker

## 2020-11-22 NOTE — Telephone Encounter (Signed)
Patient's daughter is returning call regarding patient assistance forms for Eliquis (unable to contact social worker via secure chat).

## 2020-11-22 NOTE — Telephone Encounter (Signed)
Left message to call back  

## 2020-11-22 NOTE — Telephone Encounter (Signed)
Aware pt daughter returned call to triage. CSW called pt daughter back. Was able to reach her at [phone]. Introduced self, role, reason for call. Pt daughter confirms pt only coverage is [employer]. Not enrolled in Medicare or Medicaid. Explained that pt would be eligible for copay assistance card and 30 day free card if they haven't used that before. Pt daughter confirms they have used the 30 day free card. I explained how to activate the copay card and pt daughter states she can pick it up at front desk this afternoon. It was left with Caryl Pina at [facility] check in, with pt and pt daughters name. Above was confirmed with [facility] pharmacist Raquel.   Westley Hummer, MSW, [place]  [phone]

## 2020-11-22 NOTE — Telephone Encounter (Signed)
CSW received referral to f/u with pt daughter who has questions regarding patient assistance forms for Eliquis. Per chart pt currently only listed as having commercial insurance- may not be eligible for patient assistance funding. Call made to [phone], DPR on file for daughter. No answer, HIPAA compliant message left.   Westley Hummer, MSW, [place]  [phone]

## 2020-11-25 NOTE — Telephone Encounter (Signed)
Spoke with pt's daughter who report pt resumed Eliquis that same night and continuing to take it as directed.

## 2020-11-27 NOTE — Progress Notes (Signed)
Cardiology Clinic Note   Patient Name: Dakota [name] Date of Encounter: 11/29/2020  Primary Care Provider:  [name], MD Primary Cardiologist:  Dakota Majestic, MD  Patient [name] is a 87 y.o. male  presents the clinic today for follow-up evaluation of his combined systolic and diastolic heart failure.  Past Medical History    Past Medical History:  Diagnosis Date  . Aortic atherosclerosis ([place])   . Arthritis   . Benign localized prostatic hyperplasia with lower urinary tract symptoms (LUTS)   . Chronic chest pain    takes imdur  . Chronic combined systolic (congestive) and diastolic (congestive) heart failure ([place])    followed by cardiology  . Chronic dyspnea   . Cognitive impairment   . COPD, moderate ([place])   . Edema of both lower extremities    takes lasix and uses teds  . First degree heart block   . GERD (gastroesophageal reflux disease)   . Heart murmur   . History of cardiovascular stress test    Lexiscan Myoview 7/16:  EF 51%, inferior, inferolateral ischemia; Intermediate Risk  . History of smallpox   . HOH (hard of hearing)    even with hearing aids  . Hypertension   . Ischemic heart disease 2005   Dakota [name];  b.  LHC 05/23/15:  S-RPAVB ok, S-D2 ok, S-OM2 99% (s/p Synergy DES), L-LAD ok, EF normal (complicated by pseudoaneurysm)   . LBBB (left bundle branch block)   . Right arm weakness   . S/P [name] x 4    01-08-2004  . S/P drug eluting coronary stent placement    05-21-2015  PCI and DES x1 to SVG--OM graft  . Thalassemia minor   . Type 2 diabetes, diet controlled ([place])   . Wears glasses   . Wears hearing aid in both ears    Past Surgical History:  Procedure Laterality Date  . APPENDECTOMY    . CARDIAC CATHETERIZATION  1989   in [place]   normal    . CARDIAC CATHETERIZATION N/A 05/23/2015   Procedure: Left Heart Cath and Cors/Grafts Angiography;  Surgeon: Dakota Sine, MD;  Location: [facility];   Service: Cardiovascular;  Laterality: N/A;  . CARDIAC CATHETERIZATION N/A 05/23/2015   Procedure: Coronary Stent Intervention;  Surgeon: Dakota Sine, MD;  Location: [facility];  Service: Cardiovascular;  Laterality: N/A;  . CARDIAC CATHETERIZATION  01-04-2004   Dakota [name]  @[facility]    severe 3V CAD, normal LVF  . CATARACT EXTRACTION W/ INTRAOCULAR LENS  IMPLANT, BILATERAL    . CORONARY ARTERY BYPASS GRAFT  01-08-2004  Dakota [name] @[facility]    LIMA to LAD,  SVG to D1,  SVG to OM,  SVG to PDA  . FOOT SURGERY Right   . INGUINAL HERNIA REPAIR Bilateral 1978  . MASS EXCISION Left 11/20/2019   Procedure: EXCISION OF SEBACEOUS CYST CHEST;  Surgeon: Dakota [name], Dakota Bruce, MD;  Location: [facility];  Service: General;  Laterality: Left;    Allergies  Allergies  Allergen Reactions  . Aceon [Perindopril Erbumine] Shortness Of Breath and Cough  . Sulfa Antibiotics Swelling  . Pork-Derived Products     muslim    History of Present Illness    Dakota [name] has a PMH ofleft bundle branch block, combined systolic and diastolic heart failure, CAD, essential hypertension, moderate COPD, diabetes, and nonspecific chest pain.  He was  seen by Dakota.  Kelly on 07/17/2019.  During that time he felt well and was considering return to work as well or greater in January 2021.  However, due to the COVID-19 pandemic it was recommended that he not return to work at this time.  An echocardiogram on 07/19/2019 to evaluate systolic function showed an LVEF of 50 to 55%, a mildly dilated left atrium, moderately calcified mitral valve, severe aortic valve annular calcification, mitral valve regurgitation, mild tricuspid valve regurgitation, and mildly elevated pulmonary artery systolic pressure.  He underwent [name] in 2005 with a LIMA to LAD, SVG to diagonal, SVG to obtuse marginal and SVG to distal RCA.  He has a history of combined systolic and diastolic heart failure, left bundle branch block and diabetes  mellitus.  In July 2016 he developed symptoms worrisome for unstable angina.  A nuclear stress test was abnormal and demonstrated inferior to inferolateral ischemia.  He underwent cardiac catheterization on 05/21/2015.  His catheterization showed low normal global LV function with an EF of 50 to 55%.  The catheterization also demonstrated coronary artery disease with 95% native distal left main stenosis with proximal occlusion of the LAD after the first 2 septal perforating arteries total occlusion of the circumflex at the ostium and RCA stenosis with 50% proximal, 50% mid and total occlusion of the distal RCA.  His LIMA graft was patent to his mid LAD.  This vein graft supplying the circumflex marginal and 99% distal stenosis.  He also had a patent vein graft supplying the diagonal vessel and a patent vein graft to the distal RCA.  A DES x1 to the obtuse marginal vein graft was placed at that time.  He presented the clinic 08/07/2019 and stated he felt well.  He continued to be active outside walking daily.  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  However, his daughter did not think this was a good idea.  Given his comorbidities and cardiac history with increased risk of COVID-19 he was advised not to return to work.   I will gave him a work note.  He had no other concerns or complaints at the time.  He was seen by Dakota [name] 11/21/2020.  During that time he reported increased dyspnea and lower extremity swelling for several weeks.  He had a cough that produced yellow phlegm.  He had taken extra furosemide, was urinating more however, still had lower extremity swelling.  He was seen by his primary care who ordered a chest x-ray which showed bilateral pleural effusion.  He was started on Levaquin for possible PNA.  Lab work was also drawn at the time but was still pending.  His furosemide was increased to 40 mg twice daily.  He was instructed that if he became worse he may be a candidate  for thoracentesis.  He presents the clinic today for follow-up evaluation states he is feeling much better.  He questions whether he needs to continue to take 40 mg of Lasix twice daily.  He reports that he notices that his ankles still have some swelling.  He feels that the fluid is coming off fairly slowly.  He continues to work as a Therapist, sports.  He enjoys working and feels that it encourages him to stay active.  His daughter reports that they have removed salt and have stopped cooking with salt.  He has been limiting his fluids.  We reviewed his medications and I recommended that he take his furosemide in the morning  and in the afternoon rather than at night.  I will give him salty 6 diet sheet, order a BMP, have him continue to increase his physical activity, and follow-up in 6 months.    Today he denies chest pain, increased shortness of breath, lower extremity edema, fatigue, palpitations, melena, hematuria, hemoptysis, diaphoresis, weakness, presyncope, syncope, orthopnea, and PND.  Home Medications    Prior to Admission medications   Medication Sig Start Date End Date Taking? Authorizing Provider  albuterol (PROVENTIL HFA;VENTOLIN HFA) 108 (90 Base) MCG/ACT inhaler Inhale 2 puffs into the lungs every 4 (four) hours as needed for wheezing or shortness of breath. 01/03/16   Melony Overly, MD  amLODipine (NORVASC) 2.5 MG tablet Take 1 tablet (2.5 mg total) by mouth daily. 07/31/20   Barrett, Evelene Croon, PA-C  apixaban (ELIQUIS) 2.5 MG TABS tablet Take 1 tablet (2.5 mg total) by mouth 2 (two) times daily. 07/31/20 10/02/20  Barrett, Evelene Croon, PA-C  docusate sodium (COLACE) 100 MG capsule Take 100 mg by mouth at bedtime.    [provider]  finasteride (PROSCAR) 5 MG tablet Take 5 mg by mouth at bedtime.     [provider]  furosemide (LASIX) 40 MG tablet Take 1 tablet (40 mg total) by mouth 2 (two) times daily. 11/21/20 11/16/21  [name], Peter M, MD  isosorbide mononitrate  (IMDUR) 30 MG 24 hr tablet Take 1 tablet (30 mg total) by mouth daily. 09/09/20   Dakota Sine, MD  mirabegron ER (MYRBETRIQ) 50 MG TB24 tablet Take 50 mg by mouth at bedtime.     [provider]  Multiple Vitamin (MULTIVITAMIN WITH MINERALS) TABS tablet Take 1 tablet by mouth daily.    [provider]  Multiple Vitamins-Minerals (PRESERVISION AREDS 2 PO) Take 1 tablet by mouth 2 (two) times daily.     [provider]  NITROSTAT 0.4 MG SL tablet PLACE 1 TABLET UNDER THE TONGUE EVERY 5 MINUTES AS NEEDED FOR CHEST PAIN Patient taking differently: Place 0.4 mg under the tongue every 5 (five) minutes as needed. 04/25/18   Lendon Colonel, NP  ONE TOUCH ULTRA TEST test strip 1 each by Other route as needed (glucose monoring).  04/21/15   [provider]  pantoprazole (PROTONIX) 40 MG tablet Take 1 tablet (40 mg total) by mouth daily. Patient taking differently: Take 40 mg by mouth daily. 05/21/15   Richardson Dopp T, PA-C  pravastatin (PRAVACHOL) 80 MG tablet Take 80 mg by mouth at bedtime.    [provider]  terbinafine (LAMISIL) 250 MG tablet Take 250 mg by mouth daily.    [provider]    Family History    Family History  Problem Relation Age of Onset  . Healthy Father   . Ulcers Mother   . Stroke Mother    He indicated that his mother is deceased. He indicated that his father is deceased. He indicated that his sister is deceased. He indicated that his maternal grandmother is deceased. He indicated that his maternal grandfather is deceased. He indicated that his paternal grandmother is deceased. He indicated that his paternal grandfather is deceased.  Social History    Social History   Socioeconomic History  . Marital status: Married    Spouse name: Not on file  . Number of children: 2 d  . Years of education: Not on file  . Highest education level: Not on file  Occupational History  . Occupation: Optometrist    Comment:  [employer]  Tobacco Use  . Smoking status: Never Smoker  . Smokeless tobacco: Never Used  Vaping Use  . Vaping Use: Never used  Substance and Sexual Activity  . Alcohol use: No    Alcohol/week: 0.0 standard drinks  . Drug use: No  . Sexual activity: Not on file  Other Topics Concern  . Not on file  Social History Narrative  . Not on file   Social Determinants of Health   Financial Resource Strain: Not on file  Food Insecurity: Not on file  Transportation Needs: Not on file  Physical Activity: Not on file  Stress: Not on file  Social Connections: Not on file  Intimate Partner Violence: Not on file     Review of Systems    General:  No chills, fever, night sweats or weight changes.  Cardiovascular:  No chest pain, dyspnea on exertion, edema, orthopnea, palpitations, paroxysmal nocturnal dyspnea. Dermatological: No rash, lesions/masses Respiratory: No cough, dyspnea Urologic: No hematuria, dysuria Abdominal:   No nausea, vomiting, diarrhea, bright red blood per rectum, melena, or hematemesis Neurologic:  No visual changes, wkns, changes in mental status. All other systems reviewed and are otherwise negative except as noted above.  Physical Exam    VS:  BP 102/62   Pulse 74   Ht 5\' 3"  (1.6 m)   Wt 112 lb 3.2 oz (50.9 kg)   SpO2 98%   BMI 19.88 kg/m  , BMI Body mass index is 19.88 kg/m. GEN: Well nourished, well developed, in no acute distress. HEENT: normal. Neck: Supple, no JVD, carotid bruits, or masses. Cardiac: RRR, no murmurs, rubs, or gallops. No clubbing, cyanosis, +1 pitting bilateral lower extremity edema to the midshin.  Radials/DP/PT 2+ and equal bilaterally.  Respiratory:  Respirations regular and unlabored, clear to auscultation bilaterally. GI: Soft, nontender, nondistended, BS + x 4. MS: no deformity or atrophy. Skin: warm and dry, no rash. Neuro:  Strength and sensation are intact. Psych: Normal affect.  Accessory Clinical Findings    Recent  Labs: 08/07/2020: BUN 21; Creatinine, Ser 0.65; Hemoglobin 10.2; Platelets 206; Potassium 4.8; Sodium 137   Recent Lipid Panel    Component Value Date/Time   CHOL 126 02/24/2019 1058   TRIG 93 02/24/2019 1058   HDL 55 02/24/2019 1058   CHOLHDL 2.3 02/24/2019 1058   CHOLHDL 2.2 07/20/2017 0949   VLDL 10 09/07/2016 1027   LDLCALC 52 02/24/2019 1058   LDLCALC 45 07/20/2017 0949    ECG personally reviewed by me today- none today  Echocardiogram 07/19/2019  IMPRESSIONS    1. Left ventricular ejection fraction, by visual estimation, is 50 to  55%. The left ventricle has normal function. There is no left ventricular  hypertrophy.  2. Left ventricular diastolic Doppler parameters are consistent with  impaired relaxation pattern of LV diastolic filling.  3. Global right ventricle has normal systolic function.The right  ventricular size is normal. No increase in right ventricular wall  thickness.  4. Left atrial size was mildly dilated.  5. Right atrial size was normal.  6. Moderate calcification of the mitral valve leaflet(s).  7. Moderate thickening of the mitral valve leaflet(s).  8. Severe aortic valve annular calcification.  9. The mitral valve is abnormal. Trace mitral valve regurgitation.  10. The tricuspid valve is grossly normal. Tricuspid valve regurgitation  is mild.  11. The aortic valve has an indeterminant number of cusps Aortic valve  regurgitation was not visualized by color flow Doppler.  12. The aortic valve is heavily calcified.  The leaflet mobility appears to  be reduced but there was not a significant AV gradient detected.  13. The pulmonic valve was grossly normal. Pulmonic valve regurgitation is  not visualized by color flow Doppler.  14. Mildly elevated pulmonary artery systolic pressure.  15. The atrial septum is grossly normal.   Assessment & Plan   1.  Acute on chronic combined diastolic and systolic CHF-+1 pitting bilateral lower extremity  edema to midshin.  No increased DOE or activity intolerance.  Reports compliance with his low-sodium diet and furosemide.  Denies presyncope and syncope. Continue furosemide, amlodipine, Imdur Heart healthy low-sodium diet-salty 6 given Increase physical activity as tolerated Continue lower extremity support stockings. Order BMP  Essential hypertension-blood pressure today 102/62. Continue Imdur, amlodipine Increase physical activity as tolerated Heart healthy low-sodium diet  Atrial fibrillation-heart rate today 74 BPM.  Echocardiogram from 07/19/2019 showed an LVEF of 50 to 55%,a mildly dilated left atrium, moderately calcified mitral valve, severe aortic valve annular calcification, mitral valve regurgitation, mild tricuspid valve regurgitation, and mildly elevated pulmonary artery systolic pressure. Continue furosemide, Eliquis Avoid triggers caffeine, chocolate, EtOH, dehydration etc.  Hyperlipidemia-02/24/2019: Cholesterol, Total 126; HDL 55; LDL Calculated 52; Triglycerides 93 Continue pravastatin  Heart healthy high-fiber low-sodium diet Increase physical activity as tolerated Follows with PCP  Coronary artery disease-no chest pain today.  No chest pain since his last stent placement 2016 Continue amlodipine, aspirin , Eliquis, Imdur, nitroglycerin  Heart healthy high-fiber low-sodium diet Increase physical activity as tolerated  Disposition: Follow-up with Dakota. Claiborne [name] in 3 months.  Jossie Ng. Malekai Markwood NP-C    11/29/2020, 3:31 PM [address] Office [phone] Fax [phone]  Notice: This dictation was prepared with Dragon dictation along with smaller phrase technology. Any transcriptional errors that result from this process are unintentional and may not be corrected upon review.  I spent 15 minutes examining this patient, reviewing medications, and using patient centered shared decision making involving her cardiac care.   Prior to her visit I spent greater than 20 minutes reviewing her past medical history,  medications, and prior cardiac tests.

## 2020-11-29 ENCOUNTER — Ambulatory Visit (INDEPENDENT_AMBULATORY_CARE_PROVIDER_SITE_OTHER): Payer: BC Managed Care – PPO | Admitting: General Practice

## 2020-11-29 ENCOUNTER — Encounter: Payer: Self-pay | Admitting: General Practice

## 2020-11-29 ENCOUNTER — Other Ambulatory Visit: Payer: Self-pay

## 2020-11-29 VITALS — BP 102/62 | HR 74 | Ht 63.0 in | Wt 112.2 lb

## 2020-11-29 DIAGNOSIS — I5043 Acute on chronic combined systolic (congestive) and diastolic (congestive) heart failure: Secondary | ICD-10-CM | POA: Diagnosis not present

## 2020-11-29 DIAGNOSIS — I2510 Atherosclerotic heart disease of native coronary artery without angina pectoris: Secondary | ICD-10-CM

## 2020-11-29 DIAGNOSIS — I10 Essential (primary) hypertension: Secondary | ICD-10-CM | POA: Diagnosis not present

## 2020-11-29 DIAGNOSIS — I4891 Unspecified atrial fibrillation: Secondary | ICD-10-CM | POA: Diagnosis not present

## 2020-11-29 DIAGNOSIS — Z79899 Other long term (current) drug therapy: Secondary | ICD-10-CM

## 2020-11-29 DIAGNOSIS — E785 Hyperlipidemia, unspecified: Secondary | ICD-10-CM | POA: Diagnosis not present

## 2020-11-29 NOTE — Patient Instructions (Addendum)
Medication Instructions:  TAKE YOUR FUROSEMIDE(LASIX) AT 7AM AND 1PM *If you need a refill on your cardiac medications before your next appointment, please call your pharmacy*  Lab Work: BMET TODAY If you have labs (blood work) drawn today and your tests are completely normal, you will receive your results only by:  [facility] (if you have MyChart) OR A paper copy in the mail.  If you have any lab test that is abnormal or we need to change your treatment, we will call you to review the results. You may go to any [facility] that is convenient for you however, we do have a lab in our office that is able to assist you. You DO NOT need an appointment for our lab. The lab is open 8:00am and closes at 4:00pm. Lunch 12:45 - 1:45pm.  Special Instructions FLUID RESTRICTION <48-64 [place]  PLEASE READ AND FOLLOW SALTY 6-ATTACHED-1,800 mg daily  PLEASE INCREASE PHYSICAL ACTIVITY AS TOLERATED  Follow-Up: Your next appointment:  3 month(s)(or 1st AVAILABLE IN 3 MO+) In Person with DR [name]   At [facility], you and your health needs are our priority.  As part of our continuing mission to provide you with exceptional heart care, we have created designated Provider Care Teams.  These Care Teams include your primary Cardiologist (physician) and Advanced Practice Providers (APPs -  Physician Assistants and Nurse Practitioners) who all work together to provide you with the care you need, when you need it.

## 2020-11-30 LAB — BASIC METABOLIC PANEL
BUN/Creatinine Ratio: 20 (ref 10–24)
BUN: 17 mg/dL (ref 10–36)
CO2: 29 mmol/L (ref 20–29)
Calcium: 8.7 mg/dL (ref 8.6–10.2)
Chloride: 98 mmol/L (ref 96–106)
Creatinine, Ser: 0.86 mg/dL (ref 0.76–1.27)
GFR calc Af Amer: 87 mL/min/{1.73_m2} (ref >59)
GFR calc non Af Amer: 75 mL/min/{1.73_m2} (ref >59)
Glucose: 161 mg/dL — ABNORMAL HIGH (ref 65–99)
Potassium: 3.8 mmol/L (ref 3.5–5.2)
Sodium: 140 mmol/L (ref 134–144)

## 2020-12-03 ENCOUNTER — Telehealth: Payer: Self-pay | Admitting: Cardiovascular Disease

## 2020-12-03 MED ORDER — FUROSEMIDE 40 MG PO TABS: 40.0000 mg | ORAL_TABLET | Freq: Every day | ORAL | 3 refills | Status: DC

## 2020-12-03 NOTE — Telephone Encounter (Signed)
Spoke with patient's daughter per DPR. She is concerned that patient's PO intake is decreasing and he is getting leg cramps. She wants to know if he continues to take 20mg  of Lasix BID if he should be taking a potassium supplement. She also wants to know if he should cut Lasix down to 20mg  daily. She reports the swelling is getting better. Will route to Coletta Memos, NP who recently saw patient for recommendations. Patient's daughter reports she can be responded to via mychart. Confirmed patient uses the [facility] on [address] if prescription is ordered.

## 2020-12-03 NOTE — Telephone Encounter (Signed)
Dakota [name](daughter) of Pt  informed of providers recommendations. verbalized understanding. No further questions . (DPR)

## 2020-12-03 NOTE — Telephone Encounter (Signed)
Pt c/o medication issue:  1. Name of Medication: furosemide (LASIX) 40 MG tablet    2. How are you currently taking this medication (dosage and times per day)? As prescribed.  3. Are you having a reaction (difficulty breathing--STAT)? No.  4. What is your medication issue? Patients daughter is calling stating that he is taking this medication as prescribed but she thinks that 1 tablet by mouth 2 times a day is too much. Please advise.

## 2021-03-12 ENCOUNTER — Ambulatory Visit (INDEPENDENT_AMBULATORY_CARE_PROVIDER_SITE_OTHER): Payer: BC Managed Care – PPO | Admitting: Cardiovascular Disease

## 2021-03-12 ENCOUNTER — Encounter: Payer: Self-pay | Admitting: Cardiovascular Disease

## 2021-03-12 ENCOUNTER — Other Ambulatory Visit: Payer: Self-pay

## 2021-03-12 VITALS — BP 102/70 | HR 81 | Ht 63.0 in

## 2021-03-12 DIAGNOSIS — Z951 Presence of aortocoronary bypass graft: Secondary | ICD-10-CM

## 2021-03-12 DIAGNOSIS — R600 Localized edema: Secondary | ICD-10-CM

## 2021-03-12 DIAGNOSIS — I5189 Other ill-defined heart diseases: Secondary | ICD-10-CM

## 2021-03-12 DIAGNOSIS — Z7901 Long term (current) use of anticoagulants: Secondary | ICD-10-CM | POA: Diagnosis not present

## 2021-03-12 DIAGNOSIS — I2510 Atherosclerotic heart disease of native coronary artery without angina pectoris: Secondary | ICD-10-CM | POA: Diagnosis not present

## 2021-03-12 DIAGNOSIS — I4891 Unspecified atrial fibrillation: Secondary | ICD-10-CM

## 2021-03-12 DIAGNOSIS — I10 Essential (primary) hypertension: Secondary | ICD-10-CM | POA: Diagnosis not present

## 2021-03-12 DIAGNOSIS — I4811 Longstanding persistent atrial fibrillation: Secondary | ICD-10-CM

## 2021-03-12 DIAGNOSIS — E785 Hyperlipidemia, unspecified: Secondary | ICD-10-CM

## 2021-03-12 NOTE — Progress Notes (Signed)
Patient ID: Dakota [name], male   DOB: 03-Nov-1925, 87 y.o.   MRN: [id number]    Primary M.D.: Dr. Delfino Lovett Tisovic  HPI: Dakota [name] is a 87 y.o. male is a former patient of Dr. Mare Ferrari.  He presents for a 6 month follow-up evaluation.  [name] has CAD and underwent CABG revascularization surgery in 2005 with a LIMA to the LAD, SVG to the diagonal, SVG to the obtuse marginal, and SVG to his distal RCA.  He has been demonstrated to have combined systolic and diastolic heart failure, left bundle branch block and diabetes mellitus.  In 2013.  An echo Doppler study showed an EF of 50-55% with moderate diastolic dysfunction.  In July 2016 he developed symptoms worrisome for unstable angina.  A nuclear perfusion study was abnormal and demonstrated inferior to inferolateral ischemia.  He underwent cardiac catheterization by me on 05/21/2015.  This revealed low normal global LV function with an EF of 50-55%.  There was significant native CAD with 95% distal left main stenosis, proximal occlusion of the LAD after the first 2 septal perforating arteries, total occlusion of the circumflex at the ostium, and RCA stenoses, 50% proximally, 50% mid, and total occlusion of the distal RCA after a small PDA-like vessel.  He had a patent LIMA graft supplying the mid LAD.  The vein graft supplying the circumflex marginal had a 99% distal anastomosis stenosis.  He had a patent vein graft supplying the diagonal vessel and a pain vein graft supplying the distal RCA with some collateralization of the AV groove circumflex from the PLA vessel. I performed successful intervention to the vein graft supplying the obtuse marginal vessel and a 3.016 mm Synergy DES stent postdilated to 3.43 mm was inserted with resumption of brisk TIMI-3 flow and 0 residual stenosis.  This procedure was complicated by a small pseudoaneurysm which essentially self thrombosed and he did not require any thrombin injection.  He last saw Dr. Mare Ferrari in  January 2017.   He established cardiology care with me in the office setting on 03/26/2016.  He denies any episodes of chest pain.  The patient has remained fairly active.  He still works at [employer] 4 days per week for 8 hours shifts.  He was treated for bronchitis.  He has noticed some mild shortness of breath with activity. He is unaware of palpitations.  He denies significant leg swelling.  He has not had recent blood work.   He underwent a repeat echo Doppler study on 11/04/2016.  This demonstrated normal systolic function with an EF of 50-55% with mild LVH and no regional wall motion abnormalities.  He had grade 2 diastolic dysfunction.  There was moderate aortic valve sclerosis without stenosis, moderate left atrial dilatation, mild right atrial dilatation, and mild pulmonary hypertension with a PA estimated pressure 36 mm.  6 months, he continues to do well.  He again denies recurrent anginal type symptoms.  I reviewed lab work done by Dr. Rosana Hoes on December 07, 2016.  Total cholesterol was 124, triglycerides 65, HDL 46, and LDL 65.  Glucose was 101.  Creatinine was 0.9.  When I last saw him in February 2019 he was continuing to remain stable from a cardiovascular standpoint. He was taken off Amaryl by Dr. Rosana Hoes.  Subsequent blood work  had shown his glucose increased at 141.  He was mildly anemic with a hemoglobin of 10.9, hematocrit 36.3.  Lipid studies were excellent with a total cholesterol 114, HDL 51, triglycerides 93,  LDL 45.[name]  He continues to work at [employer] from 9 AM until 6 PM on Thursday, Friday, Saturday, and Sundays.    He was hospitalized on June 15 and discharged April 11, 2018 with left-sided chest pain not nitrate responsive.  He was subsequently seen by Jory Sims, NP in the office on April 21, 2018 and there was some confusion concerning his medications.  At time losartan was discontinued and he was started on low-dose nitrate therapy.  I last saw him in September 2020.   Over the prior several months he had felt well.  He was taking furosemide 40 mg every other day isosorbide at dinnertime and icontinued to be on aspirin and Plavix.  He is on pravastatin 80 mg at bedtime.  He has noticed recently his blood pressure has been more increased.  He was no longer on the losartan.  At that time, his ECG showed sinus bradycardia with first-degree block and then transition into PAF with ventricular rate at 50.  He has chronic left bundle branch block.  I scheduled him for follow-up echo Doppler study.  I have not seen him since September 2020.  He was seen in follow-up on August 07, 2019 by Coletta Memos, NP.  His echo Doppler study from September 2020 showed an EF of 50 to 55%.  There was mild TR, aortic valve calcification with mild AR but no stenosis.  Apparently, in April 2021 there was a concern of possible TIA where his right arm suddenly did not work well with complete normalization.  He was last evaluated on July 31, 2020 by Rosaria Ferries, PA at that time, he denied any recurrent chest pain.  He denied any awareness of palpitations or heart rate irregularity.  I last saw him in December 2021.  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  However now he is able to sit in a chair and set his stand up all day.  He has noticed some mild ankle swelling bilaterally.  He denies chest pain.  He has continued to be on amlodipine 2.5 mg, furosemide 20 mg every other day, isosorbide 30 mg daily, and has been without anginal symptoms.  He is on pravastatin 80 mg for hyperlipidemia.  He is on low-dose Eliquis 2.5 mg twice a day for anticoagulation.  He is on pantoprazole for GERD.    He was seen by Dr. Martinique on November 21, 2020 with reports of increased dyspnea and edema for several weeks.  He had cough with some yellow phlegm production and had taken Lasix.  In that evaluation furosemide was  increased to 40 mg twice a day.  He was instructed that if he became worse he may be a candidate for possible thoracentesis as he was noted to have bilateral pleural effusion on chest x-ray.  He was felt to have acute on chronic combined heart failure.  He was seen on November 29, 2020 by Coletta Memos and was feeling better.  His ankle swelling had improved.  Subsequently, Dakota [name] retired from [employer] in April 2022.  He continues to have dependent edema.  He denies any chest pain.  Present medications include amlodipine 2.5 mg, furosemide which she is now taking 40 mg every other day, isosorbide 30 mg daily.  He is on Eliquis 2.5 mg twice a day for anticoagulation.  He also is on pravastatin 80 mg daily.  He presents for evaluation.  Past Medical History:  Diagnosis Date  . Aortic atherosclerosis ([place])   . Arthritis   . Benign localized prostatic hyperplasia with lower urinary tract symptoms (LUTS)   . Chronic chest pain    takes imdur  . Chronic combined systolic (congestive) and diastolic (congestive) heart failure ([place])    followed by cardiology  . Chronic dyspnea   . Cognitive impairment   . COPD, moderate ([place])   . Edema of both lower extremities    takes lasix and uses teds  . First degree heart block   . GERD (gastroesophageal reflux disease)   . Heart murmur   . History of cardiovascular stress test    Lexiscan Myoview 7/16:  EF 51%, inferior, inferolateral ischemia; Intermediate Risk  . History of smallpox   . HOH (hard of hearing)    even with hearing aids  . Hypertension   . Ischemic heart disease 2005   Dr [name] a. s/p CABG;  b.  LHC 05/23/15:  S-RPAVB ok, S-D2 ok, S-OM2 99% (s/p Synergy DES), L-LAD ok, EF normal (complicated by pseudoaneurysm)   . LBBB (left bundle branch block)   . Right arm weakness   . S/P CABG x 4    01-08-2004  . S/P drug eluting coronary stent placement    05-21-2015  PCI and DES x1 to SVG--OM graft  . Thalassemia minor   . Type 2 diabetes,  diet controlled ([place])   . Wears glasses   . Wears hearing aid in both ears     Past Surgical History:  Procedure Laterality Date  . APPENDECTOMY    . CARDIAC CATHETERIZATION  1989   in [place]   normal    . CARDIAC CATHETERIZATION N/A 05/23/2015   Procedure: Left Heart Cath and Cors/Grafts Angiography;  Surgeon: Troy Sine, MD;  Location: [facility];  Service: Cardiovascular;  Laterality: N/A;  . CARDIAC CATHETERIZATION N/A 05/23/2015   Procedure: Coronary Stent Intervention;  Surgeon: Troy Sine, MD;  Location: [facility];  Service: Cardiovascular;  Laterality: N/A;  . CARDIAC CATHETERIZATION  01-04-2004   dr Cathie Olden  $Remo'@[facility]em[facility]$    severe 3V CAD, normal LVF  . CATARACT EXTRACTION W/ INTRAOCULAR LENS  IMPLANT, BILATERAL    . CORONARY ARTERY BYPASS GRAFT  01-08-2004  dr hendrickson $RemoveBefore'@[facility]$    LIMA to LAD,  SVG to D1,  SVG to OM,  SVG to PDA  . FOOT SURGERY Right   . INGUINAL HERNIA REPAIR Bilateral 1978  . MASS EXCISION Left 11/20/2019   Procedure: EXCISION OF SEBACEOUS CYST CHEST;  Surgeon: Kieth Brightly, Arta Bruce, MD;  Location: [facility];  Service: General;  Laterality: Left;    Allergies  Allergen Reactions  . Aceon [Perindopril Erbumine] Shortness Of Breath and Cough  . Sulfa Antibiotics Swelling  . Pork-Derived Products     muslim    Current Outpatient Medications  Medication Sig Dispense Refill  . albuterol (PROVENTIL HFA;VENTOLIN HFA) 108 (90 Base) MCG/ACT inhaler Inhale 2 puffs into the lungs every 4 (four) hours as needed for wheezing or shortness of breath. 1 Inhaler 0  . amLODipine (NORVASC) 2.5 MG tablet Take 2.5 mg by mouth daily.    [name] apixaban (ELIQUIS) 2.5 MG TABS tablet Take by mouth 2 (two) times daily.    [name] docusate sodium (COLACE) 100 MG capsule Take 100 mg by mouth at bedtime.    . finasteride (PROSCAR) 5 MG tablet Take 5 mg by mouth at  bedtime.     . furosemide (LASIX) 40 MG tablet Take 40 mg by mouth every other day.    .  isosorbide mononitrate (IMDUR) 30 MG 24 hr tablet Take 1 tablet (30 mg total) by mouth daily. 90 tablet 3  . mirabegron ER (MYRBETRIQ) 50 MG TB24 tablet Take 50 mg by mouth at bedtime.     . Multiple Vitamin (MULTIVITAMIN WITH MINERALS) TABS tablet Take 1 tablet by mouth daily.    . Multiple Vitamins-Minerals (PRESERVISION AREDS 2 PO) Take 1 tablet by mouth 2 (two) times daily.     [name] NITROSTAT 0.4 MG SL tablet PLACE 1 TABLET UNDER THE TONGUE EVERY 5 MINUTES AS NEEDED FOR CHEST PAIN (Patient taking differently: Place 0.4 mg under the tongue every 5 (five) minutes as needed.) 25 tablet 1  . ONE TOUCH ULTRA TEST test strip 1 each by Other route as needed (glucose monoring).     . pantoprazole (PROTONIX) 40 MG tablet Take 1 tablet (40 mg total) by mouth daily. (Patient taking differently: Take 40 mg by mouth daily.) 90 tablet 3  . pravastatin (PRAVACHOL) 80 MG tablet Take 80 mg by mouth at bedtime.     Current Facility-Administered Medications  Medication Dose Route Frequency Provider Last Rate Last Admin  . SOLUTION-PLUS STOPCOCK MISC   Does not apply Once Lorretta Harp, MD        Social History   Socioeconomic History  . Marital status: Married    Spouse name: Not on file  . Number of children: 2 d  . Years of education: Not on file  . Highest education level: Not on file  Occupational History  . Occupation: accountant    Comment: [employer]  Tobacco Use  . Smoking status: Never Smoker  . Smokeless tobacco: Never Used  Vaping Use  . Vaping Use: Never used  Substance and Sexual Activity  . Alcohol use: No    Alcohol/week: 0.0 standard drinks  . Drug use: No  . Sexual activity: Not on file  Other Topics Concern  . Not on file  Social History Narrative  . Not on file   Social Determinants of Health   Financial Resource Strain: Not on file  Food Insecurity: Not on file  Transportation Needs: Not on file  Physical Activity: Not on file  Stress: Not on file  Social  Connections: Not on file  Intimate Partner Violence: Not on file    Additional social history is notable in that he had worked at the [place] [employer] for 30 years in the [place].  Family History  Problem Relation Age of Onset  . Healthy Father   . Ulcers Mother   . Stroke Mother    Both parents are deceased.  ROS General: Negative; No fevers, chills, or night sweats HEENT: Uses a hearing aid; no visual changes sinus congestion, difficulty swallowing Pulmonary: Positive for remote bronchitis Cardiovascular: See HPI: No chest pain, presyncope, syncope, palpatations GI: Negative; No nausea, vomiting, diarrhea, or abdominal pain GU: Negative; No dysuria, hematuria, or difficulty voiding Musculoskeletal: Negative; no myalgias, joint pain, or weakness Hematologic: Negative; no easy bruising, bleeding Endocrine: Positive for diabetes mellitus Neuro: Question transient TIA April 2021 Skin: Negative; No rashes or skin lesions Psychiatric: Negative; No behavioral problems, depression Sleep: Negative; No snoring,  daytime sleepiness, hypersomnolence, bruxism, restless legs, hypnogognic hallucinations. Other comprehensive 14 point system review is negative   Physical Exam BP 102/70 (BP Location: Left Arm, Patient Position: Sitting, Cuff Size: Normal)  Pulse 81   Ht $R'5\' 3"'To$  (1.6 m)   SpO2 95%   BMI 19.88 kg/m    Repeat blood pressure by me 108/70  Wt Readings from Last 3 Encounters:  11/29/20 112 lb 3.2 oz (50.9 kg)  11/21/20 126 lb (57.2 kg)  10/02/20 121 lb (54.9 kg)   General: Alert, oriented, no distress.  Skin: normal turgor, no rashes, warm and dry HEENT: Normocephalic, atraumatic. Pupils equal round and reactive to light; sclera anicteric; extraocular muscles intact;  Nose without nasal septal hypertrophy Mouth/Parynx benign; Mallinpatti scale 3 Neck: No JVD, no carotid bruits; normal carotid upstroke Lungs: clear to ausculatation and percussion;  no wheezing or rales Chest wall: without tenderness to palpitation Heart: PMI not displaced, irregularly irregular consistent with atrial fibrillation, ventricular rate controlled, s1 s2 normal, 1/6 systolic murmur, no diastolic murmur, no rubs, gallops, thrills, or heaves Abdomen: soft, nontender; no hepatosplenomehaly, BS+; abdominal aorta nontender and not dilated by palpation.  Bilateral inguinal hernias, nontender Back: no CVA tenderness Pulses 2+ Musculoskeletal: full range of motion, normal strength, no joint deformities Extremities: Dependent edema of ankles no clubbing cyanosis or edema, Homan's sign negative  Neurologic: grossly nonfocal; Cranial nerves grossly wnl Psychologic: Normal mood and affect   ECG (independently read by me): Atrial fibrilation at 81, LBBB  December 2021 ECG (independently read by me): Atrial fibrillation at 77, left bundle branch block  October 2020 ECG (independently read by me): Sinus bradycardia with first-degree AV block.  Nonspecific intraventricular block.  First-degree AV block.  February 2019 ECG (independently read by me): Sinus bradycardia 56 bpm.  First-degree AV block with a PR interval at 242 ms.  Left bundle branch block.  May 2018 ECG (independently read by me): Sinus bradycardia at 59 bpm with PACs and sinus arrhythmia.  Left axis deviation.  Left bundle branch block with repolarization changes.  First-degree AV block with a PR interval at 288 ms.  QTc interval 449 ms.  November 2017 ECG (independently read by me): Probable sinus rhythm with interventricular conduction delay.  Heart rate 58 bpm.  June 2017 ECG (independently read by me): Normal sinus rhythm at 72 bpm with first-degree AV block.  PR interval 234 ms.  LABS:  BMP Latest Ref Rng & Units 11/29/2020 08/07/2020 11/20/2019  Glucose 65 - 99 mg/dL 161(H) 128(H) 107(H)  BUN 10 - 36 mg/dL $Remove'17 21 17  'vzwKArN$ Creatinine 0.76 - 1.27 mg/dL 0.86 0.65(L) 0.50(L)  BUN/Creat Ratio 10 - 24 20 32(H)  -  Sodium 134 - 144 mmol/L 140 137 145  Potassium 3.5 - 5.2 mmol/L 3.8 4.8 3.9  Chloride 96 - 106 mmol/L 98 101 99  CO2 20 - 29 mmol/L 29 24 -  Calcium 8.6 - 10.2 mg/dL 8.7 8.5(L) -     Hepatic Function Latest Ref Rng & Units 02/24/2019 07/20/2017 09/07/2016  Total Protein 6.0 - 8.5 g/dL 6.6 6.6 6.4  Albumin 3.5 - 4.6 g/dL 4.4 - 3.9  AST 0 - 40 IU/L $Remov'17 16 16  'grxLid$ ALT 0 - 44 IU/L $Remov'13 13 11  'XNlSel$ Alk Phosphatase 39 - 117 IU/L 71 - 58  Total Bilirubin 0.0 - 1.2 mg/dL 0.7 0.7 0.7  Bilirubin, Direct 0.0 - 0.3 mg/dL - - -    CBC Latest Ref Rng & Units 08/07/2020 11/20/2019 02/24/2019  WBC 3.4 - 10.8 x10E3/uL 5.6 - 7.1  Hemoglobin 13.0 - 17.7 g/dL 10.2(L) 10.9(L) 10.2(L)  Hematocrit 37.5 - 51.0 % 33.0(L) 32.0(L) 34.0(L)  Platelets 150 - 450 x10E3/uL 206 -  226   Lab Results  Component Value Date   MCV 62 (L) 08/07/2020   MCV 62 (L) 02/24/2019   MCV 61.0 (L) 04/10/2018    Lab Results  Component Value Date   TSH 0.955 07/17/2019    BNP No results found for: BNP  ProBNP    Component Value Date/Time   PROBNP 137.0 (H) 06/10/2015 1504     Lipid Panel     Component Value Date/Time   CHOL 126 02/24/2019 1058   TRIG 93 02/24/2019 1058   HDL 55 02/24/2019 1058   CHOLHDL 2.3 02/24/2019 1058   CHOLHDL 2.2 07/20/2017 0949   VLDL 10 09/07/2016 1027   LDLCALC 52 02/24/2019 1058   LDLCALC 45 07/20/2017 0949     RADIOLOGY: No results found.  IMPRESSION:  1. Longstanding persistent atrial fibrillation ([place])   2. Anticoagulation adequate   3. Essential hypertension   4. Coronary artery disease involving native coronary artery of native heart without angina pectoris   5. Hx of CABG   6. Grade II diastolic dysfunction   7. Hyperlipidemia with target LDL less than 70   8. Lower extremity edema     ASSESSMENT AND PLAN: [name] is a 87 year-old gentleman who underwent CABG surgery in 2005.  He developed  unstable angina in July 2016 was found to have a 99% stenosis in the vein graft  supplying the circumflex marginal vessel just at the distal anastomosis.  This was successfully stented.  He has not had any recurrent anginal therapy.  An echo Doppler study on 11/04/2016  was done when he experienced some shortness of breath after an episode of bronchitis revealed mild LVH with EF of 50-55% and grade 2 diastolic dysfunction.  There was aortic valve sclerosis without stenosis, with biatrial enlargement and mild pulmonary hypertension with a PA pressure peaked 36 mm.  I had seen him in 2019, his blood pressure was elevated and I recommended titration of his losartan.  Apparently, ultimately his losartan was discontinued when he was started on nitrate therapy.  He has been in persistent atrial fibrillation and has continued to be on anticoagulation with Eliquis low-dose 2.5 mg twice a day.  There was some concern that he may have suffered a small TIA in April 2021 when he developed transient right arm weakness and transient inability to right with ultimately resolved.  Over this past year he has had issues with acute on chronic diastolic heart failure and required increasing diuresis for lower extremity dependent edema.  His edema is improved and he continues to be on furosemide 40 mg every other day but had required initial twice daily dosing for short-term.  His blood pressure today is stable on amlodipine 2.5 mg, furosemide 40 mg every other day in addition to isosorbide 30 mg daily.  He is not having anginal symptoms presently.  He continues to be on pravastatin 80 mg for hyperlipidemia.  LDL cholesterol in August 2021 was excellent at 54.  He has bilateral inguinal hernias which are nontender.  He sees Dr. Osborne Casco for primary care.  I have recommended that he undergo a follow-up echo Doppler study in light of his recent CHF exacerbation, and I will see him in the office in 3 months for follow-up evaluation.   Troy Sine, MD, [facility]  03/18/2021 6:55 PM

## 2021-03-12 NOTE — Patient Instructions (Signed)
Medication Instructions:  Your physician recommends that you continue on your current medications as directed. Please refer to the Current Medication list given to you today.  *If you need a refill on your cardiac medications before your next appointment, please call your pharmacy*   Lab Work: None ordered.  If you have labs (blood work) drawn today and your tests are completely normal, you will receive your results only by: [name] MyChart Message (if you have MyChart) OR . A paper copy in the mail If you have any lab test that is abnormal or we need to change your treatment, we will call you to review the results.   Testing/Procedures: To be scheduled at [facility]: Your physician has requested that you have an echocardiogram. Echocardiography is a painless test that uses sound waves to create images of your heart. It provides your doctor with information about the size and shape of your heart and how well your heart's chambers and valves are working. This procedure takes approximately one hour. There are no restrictions for this procedure.     Follow-Up: At [facility], you and your health needs are our priority.  As part of our continuing mission to provide you with exceptional heart care, we have created designated Provider Care Teams.  These Care Teams include your primary Cardiologist (physician) and Advanced Practice Providers (APPs -  Physician Assistants and Nurse Practitioners) who all work together to provide you with the care you need, when you need it.  We recommend signing up for the patient portal called "MyChart".  Sign up information is provided on this After Visit Summary.  MyChart is used to connect with patients for Virtual Visits (Telemedicine).  Patients are able to view lab/test results, encounter notes, upcoming appointments, etc.  Non-urgent messages can be sent to your provider as well.   To learn more about what you can do with MyChart, go to  [email or web address].    Your next appointment:   3 month(s)  The format for your next appointment:   In Person  Provider:   [name], MD

## 2021-03-18 ENCOUNTER — Encounter: Payer: Self-pay | Admitting: Cardiovascular Disease

## 2021-03-18 ENCOUNTER — Telehealth: Payer: Self-pay | Admitting: Cardiovascular Disease

## 2021-03-18 NOTE — Telephone Encounter (Signed)
Patient was returning phone call 

## 2021-03-19 ENCOUNTER — Telehealth: Payer: Self-pay | Admitting: Cardiovascular Disease

## 2021-03-19 NOTE — Telephone Encounter (Signed)
Patient's wife called to see if they can get apixaban (ELIQUIS) 2.5 MG TABS tablet samples until June when the patient new insurance kick in. Please advise

## 2021-03-19 NOTE — Telephone Encounter (Signed)
Medication samples have been provided to the patient.  Drug name: eliquis 2.5mg   Qty: 3 boxes  LOT: [id number]  Exp.Date: 03/2022  Samples left at front desk for patient pick-up. Patient notified.  Sheral Apley M 4:07 PM 03/19/2021

## 2021-03-27 ENCOUNTER — Telehealth: Payer: Self-pay | Admitting: Cardiovascular Disease

## 2021-03-27 NOTE — Telephone Encounter (Signed)
Patient's daughter calling to schedule 3 month follow for the patient. She states he must be seen in the office and did not want to schedule with a PA. She would like to know if he can be worked in to see Dr. [name].

## 2021-03-27 NOTE — Telephone Encounter (Signed)
This RN returned patient's call, spoke to patient's daughter Rasul Decola, ok per DPR. This RN was able to schedule patient for an appointment on August 12 at 9:40am. Patient verbalized thanks and understanding.

## 2021-03-28 ENCOUNTER — Telehealth: Payer: Self-pay | Admitting: Cardiovascular Disease

## 2021-03-28 MED ORDER — NITROSTAT 0.4 MG SL SUBL: SUBLINGUAL_TABLET | SUBLINGUAL | 0 refills | Status: DC

## 2021-03-28 NOTE — Telephone Encounter (Signed)
*  STAT* If patient is at the pharmacy, call can be transferred to refill team.   1. Which medications need to be refilled? (please list name of each medication and dose if known)  New prescription for Nitroglycerin  2. Which pharmacy/location (including street and city if local pharmacy) is medication to be sent to? [place], [place]   Do they need a 30 day or 90 day supply?

## 2021-04-11 ENCOUNTER — Other Ambulatory Visit: Payer: Self-pay | Admitting: Physician Assistant

## 2021-04-14 ENCOUNTER — Ambulatory Visit (HOSPITAL_COMMUNITY): Payer: Medicare HMO | Attending: Cardiology

## 2021-04-14 ENCOUNTER — Other Ambulatory Visit: Payer: Self-pay

## 2021-04-14 DIAGNOSIS — I4811 Longstanding persistent atrial fibrillation: Secondary | ICD-10-CM | POA: Diagnosis not present

## 2021-04-15 LAB — ECHOCARDIOGRAM COMPLETE
AR max vel: 1.18 cm2
AV Area VTI: 1.36 cm2
AV Area mean vel: 1.2 cm2
AV Mean grad: 3.5 mmHg
AV Peak grad: 7.3 mmHg
Ao pk vel: 1.36 m/s
Area-P 1/2: 3.9 cm2
MV M vel: 4.94 m/s
MV Peak grad: 97.6 mmHg
MV VTI: 1.52 cm2
Radius: 0.55 cm
S' Lateral: 3.6 cm

## 2021-04-23 NOTE — Telephone Encounter (Signed)
Daughter updated with results and verbalized understanding.

## 2021-05-05 DIAGNOSIS — M7662 Achilles tendinitis, left leg: Secondary | ICD-10-CM | POA: Diagnosis not present

## 2021-05-21 DIAGNOSIS — R0989 Other specified symptoms and signs involving the circulatory and respiratory systems: Secondary | ICD-10-CM | POA: Diagnosis not present

## 2021-05-21 DIAGNOSIS — I110 Hypertensive heart disease with heart failure: Secondary | ICD-10-CM | POA: Diagnosis not present

## 2021-05-21 DIAGNOSIS — E1151 Type 2 diabetes mellitus with diabetic peripheral angiopathy without gangrene: Secondary | ICD-10-CM | POA: Diagnosis not present

## 2021-06-06 ENCOUNTER — Ambulatory Visit: Payer: Medicare HMO | Admitting: Cardiovascular Disease

## 2021-06-06 ENCOUNTER — Other Ambulatory Visit: Payer: Self-pay

## 2021-06-06 DIAGNOSIS — I4811 Longstanding persistent atrial fibrillation: Secondary | ICD-10-CM | POA: Diagnosis not present

## 2021-06-06 DIAGNOSIS — J449 Chronic obstructive pulmonary disease, unspecified: Secondary | ICD-10-CM | POA: Diagnosis not present

## 2021-06-06 DIAGNOSIS — E785 Hyperlipidemia, unspecified: Secondary | ICD-10-CM | POA: Diagnosis not present

## 2021-06-06 DIAGNOSIS — Z7901 Long term (current) use of anticoagulants: Secondary | ICD-10-CM

## 2021-06-06 DIAGNOSIS — I358 Other nonrheumatic aortic valve disorders: Secondary | ICD-10-CM

## 2021-06-06 DIAGNOSIS — I4819 Other persistent atrial fibrillation: Secondary | ICD-10-CM | POA: Diagnosis not present

## 2021-06-06 DIAGNOSIS — I5043 Acute on chronic combined systolic (congestive) and diastolic (congestive) heart failure: Secondary | ICD-10-CM | POA: Diagnosis not present

## 2021-06-06 DIAGNOSIS — Z951 Presence of aortocoronary bypass graft: Secondary | ICD-10-CM | POA: Diagnosis not present

## 2021-06-06 DIAGNOSIS — E119 Type 2 diabetes mellitus without complications: Secondary | ICD-10-CM | POA: Diagnosis not present

## 2021-06-06 DIAGNOSIS — I447 Left bundle-branch block, unspecified: Secondary | ICD-10-CM

## 2021-06-06 DIAGNOSIS — I10 Essential (primary) hypertension: Secondary | ICD-10-CM

## 2021-06-06 MED ORDER — FUROSEMIDE 20 MG PO TABS: 20.0000 mg | ORAL_TABLET | ORAL | 2 refills | Status: DC

## 2021-06-06 MED ORDER — LOSARTAN POTASSIUM 25 MG PO TABS: 25.0000 mg | ORAL_TABLET | Freq: Every day | ORAL | 3 refills | Status: DC

## 2021-06-06 NOTE — Patient Instructions (Signed)
Medication Instructions:  Decrease Furosemide to 20 mg every other day.  Start Losartan 25 mg daily   *If you need a refill on your cardiac medications before your next appointment, please call your pharmacy*   Lab Work: TODAY: CMET, CBC, TSH, BNP In 2 weeks: repeat BMET   If you have labs (blood work) drawn today and your tests are completely normal, you will receive your results only by: [place] (if you have MyChart) OR A paper copy in the mail If you have any lab test that is abnormal or we need to change your   Follow-Up: At [facility], you and your health needs are our priority.  As part of our continuing mission to provide you with exceptional heart care, we have created designated Provider Care Teams.  These Care Teams include your primary Cardiologist (physician) and Advanced Practice Providers (APPs -  Physician Assistants and Nurse Practitioners) who all work together to provide you with the care you need, when you need it.  We recommend signing up for the patient portal called "MyChart".  Sign up information is provided on this After Visit Summary.  MyChart is used to connect with patients for Virtual Visits (Telemedicine).  Patients are able to view lab/test results, encounter notes, upcoming appointments, etc.  Non-urgent messages can be sent to your provider as well.   To learn more about what you can do with MyChart, go to [email or web address].    Your next appointment:   2 month(s)  The format for your next appointment:   In Person  Provider:   You may see [name], MD or one of the following Advanced Practice Providers on your designated Care Team:   Almyra Deforest, PA-C Fabian Sharp, [place] or  Roby Lofts, [place]   Other Instructions  How to Use Compression Stockings Compression stockings are elastic socks that squeeze the legs. They help increase blood flow (circulation) to the legs, decrease swelling in the legs, and reduce the chance of  developing blood clots in the lower legs. Compression stockings are often used by people who: Are recovering from surgery. Have poor circulation in their legs. Tend to get blood clots in their legs. Have bulging (varicose) veins. Sit or stay in bed for long periods of time. Follow instructions from your health care provider about how and when to wearyour compression stockings. How to wear compression stockings Before you put on your compression stockings: Make sure that they are the correct size and degree of compression. If you do not know your size or required grade of compression, ask your health care provider and follow the manufacturer's instructions that come with the stockings. Make sure that they are clean, dry, and in good condition. Check them for rips and tears. Do not put them on if they are ripped or torn. Put your stockings on first thing in the morning, before you get out of bed. Keep them on for as long as your health care provider advises. When you are wearing your stockings: Keep them as smooth as possible. Do not allow them to bunch up. It is especially important to prevent the stockings from bunching up around your toes or behind your knees. Do not roll the stockings downward and leave them rolled down. This can decrease blood flow to your leg. Change them right away if they become wet or dirty. When you take off your stockings, inspect your legs and feet. Check for: Open sores. Red spots. Swelling. General tips Do not stop wearing  compression stockings without talking to your health care provider first. Wash your stockings every day with mild detergent in cold or warm water. Do not use bleach. Air-dry your stockings or dry them in a clothes dryer on low heat. It may be helpful to have two pairs so that you have a pair to wear while the other is being washed. Replace your stockings every 3-6 months. If skin moisturizing is part of your treatment plan, apply lotion or cream  at night so that your skin will be dry when you put on the stockings in the morning. It is harder to put the stockings on when you have lotion on your legs or feet. Wear nonskid shoes or slip-resistant socks when walking while wearing compression stockings. Contact a health care provider and remove your stockings if you have: A feeling of pins and needles in your feet or legs. Open sores, red spots, or other skin changes on your feet or legs. Swelling or pain that gets worse. Get help right away if you have: Numbness or tingling in your lower legs that does not get better right after you take the stockings off. Toes or feet that are unusually cold or turn a bluish color. A warm or red area on your leg. New swelling or soreness in your leg. Shortness of breath. Chest pain. A fast or irregular heartbeat. Light-headedness. Dizziness. Summary Compression stockings are elastic socks that squeeze the legs. They help increase blood flow (circulation) to the legs, decrease swelling in the legs, and reduce the chance of developing blood clots in the lower legs. Follow instructions from your health care provider about how and when to wear your compression stockings. Do not stop wearing your compression stockings without talking to your health care provider first. This information is not intended to replace advice given to you by your health care provider. Make sure you discuss any questions you have with your healthcare provider. Document Revised: 02/14/2020 Document Reviewed: 02/14/2020 [employer] Patient Education  2022 [employer].

## 2021-06-06 NOTE — Progress Notes (Signed)
Patient ID: COLUMBUS ICE, male   DOB: 06-30-1926, 87 y.o.   MRN: [id number]    Primary M.D.: Dr. Delfino Lovett [name]  HPI: Dakota [name] is a 87 y.o. male is a former patient of Dr. Mare [name].  He presents for a 3 month follow-up evaluation.  [name] has CAD and underwent CABG revascularization surgery in 2005 with a LIMA to the LAD, SVG to the diagonal, SVG to the obtuse marginal, and SVG to his distal RCA.  He has been demonstrated to have combined systolic and diastolic heart failure, left bundle branch block and diabetes mellitus.  In 2013.  An echo Doppler study showed an EF of 50-55% with moderate diastolic dysfunction.  In July 2016 he developed symptoms worrisome for unstable angina.  A nuclear perfusion study was abnormal and demonstrated inferior to inferolateral ischemia.  He underwent cardiac catheterization by me on 05/21/2015.  This revealed low normal global LV function with an EF of 50-55%.  There was significant native CAD with 95% distal left main stenosis, proximal occlusion of the LAD after the first 2 septal perforating arteries, total occlusion of the circumflex at the ostium, and RCA stenoses, 50% proximally, 50% mid, and total occlusion of the distal RCA after a small PDA-like vessel.  He had a patent LIMA graft supplying the mid LAD.  The vein graft supplying the circumflex marginal had a 99% distal anastomosis stenosis.  He had a patent vein graft supplying the diagonal vessel and a pain vein graft supplying the distal RCA with some collateralization of the AV groove circumflex from the PLA vessel. I performed successful intervention to the vein graft supplying the obtuse marginal vessel and a 3.016 mm Synergy DES stent postdilated to 3.43 mm was inserted with resumption of brisk TIMI-3 flow and 0 residual stenosis.  This procedure was complicated by a small pseudoaneurysm which essentially self thrombosed and he did not require any thrombin injection.  He last saw Dr. Mare [name] in  January 2017.   He established cardiology care with me in the office setting on 03/26/2016.  He denies any episodes of chest pain.  The patient has remained fairly active.  He still works at [employer] 4 days per week for 8 hours shifts.  He was treated for bronchitis.  He has noticed some mild shortness of breath with activity. He is unaware of palpitations.  He denies significant leg swelling.  He has not had recent blood work.   A repeat echo Doppler study on 11/04/2016 demonstrated normal systolic function with an EF of 50-55% with mild LVH and no regional wall motion abnormalities.  He had grade 2 diastolic dysfunction.  There was moderate aortic valve sclerosis without stenosis, moderate left atrial dilatation, mild right atrial dilatation, and mild pulmonary hypertension with a PA estimated pressure 36 mm.  6 months, he continues to do well.  He again denies recurrent anginal type symptoms.  I reviewed lab work done by Dr. Rosana [name] on December 07, 2016.  Total cholesterol was 124, triglycerides 65, HDL 46, and LDL 65.  Glucose was 101.  Creatinine was 0.9.  When I last saw him in February 2019 he was continuing to remain stable from a cardiovascular standpoint. He was taken off Amaryl by Dr. Rosana [name].  Subsequent blood work  had shown his glucose increased at 141.  He was mildly anemic with a hemoglobin of 10.9, hematocrit 36.3.  Lipid studies were excellent with a total cholesterol 114, HDL 51, triglycerides 93, LDL 45.[name]  He  continues to work at [employer] from 9 AM until 6 PM on Thursday, Friday, Saturday, and Sundays.    He was hospitalized on June 15 and discharged April 11, 2018 with left-sided chest pain not nitrate responsive.  He was subsequently seen by Dakota Sims, NP in the office on April 21, 2018 and there was some confusion concerning his medications.  At time losartan was discontinued and he was started on low-dose nitrate therapy.  I saw him in September 2020.  Over the prior several  months he had felt well.  He was taking furosemide 40 mg every other day isosorbide at dinnertime and icontinued to be on aspirin and Plavix.  He is on pravastatin 80 mg at bedtime.  He has noticed recently his blood pressure has been more increased.  He was no longer on the losartan.  At that time, his ECG showed sinus bradycardia with first-degree block and then transition into PAF with ventricular rate at 50.  He has chronic left bundle branch block.  I scheduled him for follow-up echo Doppler study.  I had not seen him since September 2020 but he was seen in follow-up on August 07, 2019 by Dakota Memos, NP.  His echo Doppler study from September 2020 showed an EF of 50 to 55%.  There was mild TR, aortic valve calcification with mild AR but no stenosis.  Apparently, in April 2021 there was a concern of possible TIA where his right arm suddenly did not work well with complete normalization.  He was last evaluated on July 31, 2020 by Dakota Ferries, PA at that time, he denied any recurrent chest pain.  He denied any awareness of palpitations or heart rate irregularity.  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  However now he is able to sit in a chair and set his stand up all day.  He has noticed some mild ankle swelling bilaterally.  He denies chest pain.  He has continued to be on amlodipine 2.5 mg, furosemide 20 mg every other day, isosorbide 30 mg daily, and has been without anginal symptoms.  He is on pravastatin 80 mg for hyperlipidemia.  He is on low-dose Eliquis 2.5 mg twice a day for anticoagulation.  He is on pantoprazole for GERD.    He was seen by Dr. Martinique on November 21, 2020 with reports of increased dyspnea and edema for several weeks.  He had cough with some yellow phlegm production and had taken Lasix.  In that evaluation furosemide was increased to 40 mg twice a day.  He was  instructed that if he became worse he may be a candidate for possible thoracentesis as he was noted to have bilateral pleural effusion on chest x-ray.  He was felt to have acute on chronic combined heart failure.  He was seen on November 29, 2020 by Dakota [name] and was feeling better.  His ankle swelling had improved.  I last saw him on Mar 12, 2021.  He retired from [employer] in April 2022.  His edema had improved but he continued to have some dependent edema.  He denies any chest pain.  Medications included amlodipine 2.5 mg, furosemide 40 mg every other day, isosorbide 30 mg daily.  He is on Eliquis 2.5 mg twice a day for anticoagulation.  He also is on pravastatin 80 mg daily.  He  had bilateral inguinal hernias which were nontender.  In light of his recent CHF exacerbation, I recommended a follow-up echo Doppler study.  On April 14, 2021 his echo Doppler evaluation now showed moderate LV dysfunction with EF of 35 to 40% and global hypokinesis.  His right ventricle was mildly enlarged.  There was mild MR.  He had moderate right atrial and severe left atrial dilatation.  There was mild to moderate aortic sclerosis without stenosis.  Presently, he denies any chest pain.  He continues to have venous stasis changes to his lower extremities which are dependent mediated.  He has not had recent laboratory.  He has been taking furosemide 40 mg every other day, amlodipine 2.5 mg daily, isosorbide 30 mg daily, in addition to Eliquis 2.5 mg twice a day on pravastatin 80 mg.  He presents for evaluation.  Past Medical History:  Diagnosis Date   Aortic atherosclerosis (HCC)    Arthritis    Benign localized prostatic hyperplasia with lower urinary tract symptoms (LUTS)    Chronic chest pain    takes imdur   Chronic combined systolic (congestive) and diastolic (congestive) heart failure (HCC)    followed by cardiology   Chronic dyspnea    Cognitive impairment    COPD, moderate (HCC)    Edema of both lower  extremities    takes lasix and uses teds   First degree heart block    GERD (gastroesophageal reflux disease)    Heart murmur    History of cardiovascular stress test    Lexiscan Myoview 7/16:  EF 51%, inferior, inferolateral ischemia; Intermediate Risk   History of smallpox    HOH (hard of hearing)    even with hearing aids   Hypertension    Ischemic heart disease 2005   Dr [name] a. s/p CABG;  b.  LHC 05/23/15:  S-RPAVB ok, S-D2 ok, S-OM2 99% (s/p Synergy DES), L-LAD ok, EF normal (complicated by pseudoaneurysm)    LBBB (left bundle branch block)    Right arm weakness    S/P CABG x 4    01-08-2004   S/P drug eluting coronary stent placement    05-21-2015  PCI and DES x1 to SVG--OM graft   Thalassemia minor    Type 2 diabetes, diet controlled ([place])    Wears glasses    Wears hearing aid in both ears     Past Surgical History:  Procedure Laterality Date   [place]   in [place]   normal     CARDIAC CATHETERIZATION N/A 05/23/2015   Procedure: Left Heart Cath and Cors/Grafts Angiography;  Surgeon: Troy Sine, MD;  Location: [facility];  Service: Cardiovascular;  Laterality: N/A;   CARDIAC CATHETERIZATION N/A 05/23/2015   Procedure: Coronary Stent Intervention;  Surgeon: Troy Sine, MD;  Location: [facility];  Service: Cardiovascular;  Laterality: N/A;   CARDIAC CATHETERIZATION  01-04-2004   dr Cathie Olden  $Remo'@[facility]'DShoR$    severe 3V CAD, normal LVF   CATARACT EXTRACTION W/ INTRAOCULAR LENS  IMPLANT, BILATERAL     CORONARY ARTERY BYPASS GRAFT  01-08-2004  dr hendrickson $RemoveBefore'@[facility]'[id number]$    LIMA to LAD,  SVG to D1,  SVG to OM,  SVG to PDA   FOOT SURGERY Right    INGUINAL HERNIA REPAIR Bilateral 1978   MASS EXCISION Left 11/20/2019   Procedure: EXCISION OF SEBACEOUS CYST CHEST;  Surgeon: Kieth Brightly Arta Bruce, MD;  Location: [facility];  Service: General;  Laterality: Left;    Allergies  Allergen Reactions   Aceon [Perindopril  Erbumine] Shortness Of Breath and Cough   Sulfa Antibiotics Swelling   Pork-Derived Products     muslim    Current Outpatient Medications  Medication Sig Dispense Refill   albuterol (PROVENTIL HFA;VENTOLIN HFA) 108 (90 Base) MCG/ACT inhaler Inhale 2 puffs into the lungs every 4 (four) hours as needed for wheezing or shortness of breath. 1 Inhaler 0   amLODipine (NORVASC) 2.5 MG tablet Take 1 tablet by mouth once daily 90 tablet 3   apixaban (ELIQUIS) 2.5 MG TABS tablet Take by mouth 2 (two) times daily.     docusate sodium (COLACE) 100 MG capsule Take 100 mg by mouth at bedtime.     finasteride (PROSCAR) 5 MG tablet Take 5 mg by mouth at bedtime.      isosorbide mononitrate (IMDUR) 30 MG 24 hr tablet Take 1 tablet (30 mg total) by mouth daily. 90 tablet 3   losartan (COZAAR) 25 MG tablet Take 1 tablet (25 mg total) by mouth daily. 90 tablet 3   mirabegron ER (MYRBETRIQ) 50 MG TB24 tablet Take 50 mg by mouth at bedtime.      Multiple Vitamin (MULTIVITAMIN WITH MINERALS) TABS tablet Take 1 tablet by mouth daily.     Multiple Vitamins-Minerals (PRESERVISION AREDS 2 PO) Take 1 tablet by mouth 2 (two) times daily.      NITROSTAT 0.4 MG SL tablet PLACE 1 TABLET UNDER THE TONGUE EVERY 5 MINUTES AS NEEDED FOR CHEST PAIN 25 tablet 0   ONE TOUCH ULTRA TEST test strip 1 each by Other route as needed (glucose monoring).      pantoprazole (PROTONIX) 40 MG tablet Take 1 tablet (40 mg total) by mouth daily. (Patient taking differently: Take 40 mg by mouth daily.) 90 tablet 3   pravastatin (PRAVACHOL) 80 MG tablet Take 80 mg by mouth at bedtime.     furosemide (LASIX) 20 MG tablet Take 1 tablet (20 mg total) by mouth every other day. 30 tablet 2   Current Facility-Administered Medications  Medication Dose Route Frequency Provider Last Rate Last Admin   SOLUTION-PLUS STOPCOCK MISC   Does not apply Once Lorretta Harp, MD        Social History   Socioeconomic History   Marital status: Married     Spouse name: Not on file   Number of children: 2 d   Years of education: Not on file   Highest education level: Not on file  Occupational History   Occupation: accountant    Comment: [employer]  Tobacco Use   Smoking status: Never   Smokeless tobacco: Never  Vaping Use   Vaping Use: Never used  Substance and Sexual Activity   Alcohol use: No    Alcohol/week: 0.0 standard drinks   Drug use: No   Sexual activity: Not on file  Other Topics Concern   Not on file  Social History Narrative   Not on file   Social Determinants of Health   Financial Resource Strain: Not on file  Food Insecurity: Not on file  Transportation Needs: Not on file  Physical Activity: Not on file  Stress: Not on file  Social Connections: Not on file  Intimate Partner Violence: Not on file    Additional social history is notable in that he had worked at the [place] [employer] for 30 years in the [place].  Family History  Problem Relation Age of Onset  Healthy Father    Ulcers Mother    Stroke Mother    Both parents are deceased.  ROS General: Negative; No fevers, chills, or night sweats HEENT: Uses a hearing aid; no visual changes sinus congestion, difficulty swallowing Pulmonary: Positive for remote bronchitis Cardiovascular: See HPI: No chest pain, presyncope, syncope, palpitations; dependent edema with venous insufficiency GI: Negative; No nausea, vomiting, diarrhea, or abdominal pain GU: Negative; No dysuria, hematuria, or difficulty voiding Musculoskeletal: Negative; no myalgias, joint pain, or weakness Hematologic: Negative; no easy bruising, bleeding Endocrine: Positive for diabetes mellitus Neuro: Question transient TIA April 2021 Skin: Negative; No rashes or skin lesions Psychiatric: Negative; No behavioral problems, depression Sleep: Negative; No snoring,  daytime sleepiness, hypersomnolence, bruxism, restless legs, hypnogognic hallucinations. Other  comprehensive 14 point system review is negative   Physical Exam BP 108/70   Pulse 76   Resp 18   Ht $R'5\' 3"'WF$  (1.6 m)   Wt 106 lb (48.1 kg)   SpO2 97%   BMI 18.78 kg/m    Repeat blood pressure by me was 110/72  Wt Readings from Last 3 Encounters:  06/06/21 106 lb (48.1 kg)  11/29/20 112 lb 3.2 oz (50.9 kg)  11/21/20 126 lb (57.2 kg)   General: Alert, oriented, no distress.  Skin: normal turgor, no rashes, warm and dry HEENT: Normocephalic, atraumatic. Pupils equal round and reactive to light; sclera anicteric; extraocular muscles intact;  Nose without nasal septal hypertrophy Mouth/Parynx benign; Mallinpatti scale 3 Neck: No JVD, no carotid bruits; normal carotid upstroke Lungs: clear to ausculatation and percussion; no wheezing or rales Chest wall: without tenderness to palpitation Heart: PMI not displaced, irregular irregular rhythm rate in the 70s, s1 s2 normal, 1/6 systolic murmur, no diastolic murmur, no rubs, gallops, thrills, or heaves Abdomen: soft, nontender; no hepatosplenomehaly, BS+; abdominal aorta nontender and not dilated by palpation.  Bilateral inguinal hernias, nontender Back: no CVA tenderness Pulses 2+ Musculoskeletal: full range of motion, normal strength, no joint deformities Extremities: Bluish discoloration of his feet with dependency; chronic venous stasis changes no clubbing cyanosis or edema, Homan's sign negative  Neurologic: grossly nonfocal; Cranial nerves grossly wnl Psychologic: Normal mood and affect  June 06, 2021 ECG (independently read by me): Atrial fibrillation at 76, LBBBB  May 2022 ECG (independently read by me): Atrial fibrilation at 81, LBBB  December 2021 ECG (independently read by me): Atrial fibrillation at 77, left bundle branch block  October 2020 ECG (independently read by me): Sinus bradycardia with first-degree AV block.  Nonspecific intraventricular block.  First-degree AV block.  February 2019 ECG (independently read by  me): Sinus bradycardia 56 bpm.  First-degree AV block with a PR interval at 242 ms.  Left bundle branch block.  May 2018 ECG (independently read by me): Sinus bradycardia at 59 bpm with PACs and sinus arrhythmia.  Left axis deviation.  Left bundle branch block with repolarization changes.  First-degree AV block with a PR interval at 288 ms.  QTc interval 449 ms.  November 2017 ECG (independently read by me): Probable sinus rhythm with interventricular conduction delay.  Heart rate 58 bpm.  June 2017 ECG (independently read by me): Normal sinus rhythm at 72 bpm with first-degree AV block.  PR interval 234 ms.  LABS:  BMP 06/06/2021 11/29/2020 08/07/2020  Glucose WILL FOLLOW 161(H) 128(H)  BUN WILL FOLLOW 17 21  Creatinine WILL FOLLOW 0.86 0.65(L)  BUN/Creat Ratio WILL FOLLOW 20 32(H)  Sodium WILL FOLLOW 140 137  Potassium WILL FOLLOW 3.8 4.8  Chloride WILL FOLLOW 98 101  CO2 WILL FOLLOW 29 24  Calcium WILL FOLLOW 8.7 8.5(L)     Hepatic Function Latest Ref Rng & Units 06/06/2021 02/24/2019 07/20/2017  Total Protein - WILL FOLLOW 6.6 6.6  Albumin - WILL FOLLOW 4.4 -  AST - WILL FOLLOW 17 16  ALT - WILL FOLLOW 13 13  Alk Phosphatase - WILL FOLLOW 71 -  Total Bilirubin - WILL FOLLOW 0.7 0.7  Bilirubin, Direct 0.0 - 0.3 mg/dL - - -    CBC 06/06/2021 08/07/2020 11/20/2019  WBC WILL FOLLOW 5.6 -  Hemoglobin WILL FOLLOW 10.2(L) 10.9(L)  Hematocrit WILL FOLLOW 33.0(L) 32.0(L)  Platelets WILL FOLLOW 206 -   Lab Results  Component Value Date   MCV WILL FOLLOW 06/06/2021   MCV 62 (L) 08/07/2020   MCV 62 (L) 02/24/2019    Lab Results  Component Value Date   TSH WILL FOLLOW 06/06/2021    BNP    Component Value Date/Time   BNP 528.9 (H) 06/06/2021 1129    ProBNP    Component Value Date/Time   PROBNP 137.0 (H) 06/10/2015 1504     Lipid Panel     Component Value Date/Time   CHOL 126 02/24/2019 1058   TRIG 93 02/24/2019 1058   HDL 55 02/24/2019 1058   CHOLHDL 2.3 02/24/2019  1058   CHOLHDL 2.2 07/20/2017 0949   VLDL 10 09/07/2016 1027   LDLCALC 52 02/24/2019 1058   LDLCALC 45 07/20/2017 0949     RADIOLOGY: No results found.  IMPRESSION:  1. Acute on chronic combined systolic and diastolic CHF (congestive heart failure) ([place])   2. Hx of CABG   3. Longstanding persistent atrial fibrillation ([place])   4. Anticoagulation adequate   5. Essential hypertension   6. Hyperlipidemia with target LDL less than 70   7. Left bundle branch block   8. Aortic valve sclerosis     ASSESSMENT AND PLAN: [name] is a 87 year-old gentleman who underwent CABG surgery in 2005.  He developed  unstable angina in July 2016 was found to have a 99% stenosis in the vein graft supplying the circumflex marginal vessel just at the distal anastomosis.  This was successfully stented.  He has not had any recurrent anginal therapy.  An echo Doppler study on 11/04/2016  done when he experienced some shortness of breath after an episode of bronchitis revealed mild LVH with EF of 50-55% and grade 2 diastolic dysfunction.  There was aortic valve sclerosis without stenosis, with biatrial enlargement and mild pulmonary hypertension with a PA pressure peaked 36 mm.  I had seen him in 2019, his blood pressure was elevated and I recommended titration of his losartan.  Apparently, ultimately his losartan was discontinued when he was started on nitrate therapy.  He has been in persistent atrial fibrillation and has continued to be on anticoagulation with Eliquis low-dose 2.5 mg twice a day.  There was some concern that he may have suffered a small TIA in April 2021 when he developed transient right arm weakness and transient inability to right with ultimately resolved.  Over the past year he has had issues with acute on chronic diastolic heart failure and required increasing diuresis for lower extremity dependent edema.  Most recently he has been on amlodipine 2.5 mg, furosemide 40 mg every other day, isosorbide  30 mg daily.  He is in atrial fibrillation with rate control and remains on Eliquis 2.5 mg twice a day.  I reviewed his most  recent echo Doppler study from June 2022 which now shows reduced EF at 35 to 40% with global hypokinesis.  His right ventricle was mildly enlarged.  There was moderate right atrial and severe left atrial dilatation.  He had mild MR and mild to moderate aortic sclerosis without stenosis.  He is not having any anginal symptoms.  His blood pressure today is stable.  With his reduced LV function I am recommending resumption of low-dose losartan 25 mg.  So as his blood pressure will not get too low I am reducing his furosemide to 20 mg every other day.  I will recheck a comprehensive metabolic panel, CBC, TSH and BMP today.  I have suggested support stockings with his venous insufficiency and dependent feet swelling.  In 2 weeks he will undergo a follow-up be met to make certain renal function and potassium status is stable with the addition of ARB therapy.  He continues to be on pravastatin 80 mg for hyperlipidemia.  He is followed by Dr. Osborne Casco.  LDL cholesterol in August 2021 was excellent at 54.  He has bilateral inguinal hernias which are nontender and stable.  I will see him in 2 months for reevaluation or sooner as needed.   Troy Sine, MD, [facility]  06/13/2021 1:52 PM

## 2021-06-07 LAB — SPECIMEN STATUS REPORT

## 2021-06-13 ENCOUNTER — Encounter: Payer: Self-pay | Admitting: Cardiovascular Disease

## 2021-06-13 LAB — CBC
Hematocrit: 36.4 % — ABNORMAL LOW (ref 37.5–51.0)
Hemoglobin: 10.5 g/dL — ABNORMAL LOW (ref 13.0–17.7)
MCH: 19.1 pg — ABNORMAL LOW (ref 26.6–33.0)
MCHC: 28.8 g/dL — ABNORMAL LOW (ref 31.5–35.7)
MCV: 66 fL — ABNORMAL LOW (ref 79–97)
Platelets: 213 10*3/uL (ref 150–450)
RBC: 5.49 x10E6/uL (ref 4.14–5.80)
RDW: 17.5 % — ABNORMAL HIGH (ref 11.6–15.4)
WBC: 6.6 10*3/uL (ref 3.4–10.8)

## 2021-06-13 LAB — COMPREHENSIVE METABOLIC PANEL
ALT: 13 IU/L (ref 0–44)
AST: 20 IU/L (ref 0–40)
Albumin/Globulin Ratio: 1.6 (ref 1.2–2.2)
Albumin: 4.2 g/dL (ref 3.5–4.6)
Alkaline Phosphatase: 66 IU/L (ref 44–121)
BUN/Creatinine Ratio: 29 — ABNORMAL HIGH (ref 10–24)
BUN: 27 mg/dL (ref 10–36)
Bilirubin Total: 0.5 mg/dL (ref 0.0–1.2)
CO2: 25 mmol/L (ref 20–29)
Calcium: 9.1 mg/dL (ref 8.6–10.2)
Chloride: 100 mmol/L (ref 96–106)
Creatinine, Ser: 0.92 mg/dL (ref 0.76–1.27)
Globulin, Total: 2.6 g/dL (ref 1.5–4.5)
Glucose: 116 mg/dL — ABNORMAL HIGH (ref 65–99)
Potassium: 4.8 mmol/L (ref 3.5–5.2)
Sodium: 140 mmol/L (ref 134–144)
Total Protein: 6.8 g/dL (ref 6.0–8.5)
eGFR: 78 mL/min/{1.73_m2} (ref >59)

## 2021-06-13 LAB — TSH: TSH: 1.2 u[IU]/mL (ref 0.450–4.500)

## 2021-06-13 LAB — BRAIN NATRIURETIC PEPTIDE: BNP: 528.9 pg/mL — ABNORMAL HIGH (ref 0.0–100.0)

## 2021-06-19 ENCOUNTER — Other Ambulatory Visit: Payer: Self-pay

## 2021-06-19 DIAGNOSIS — D649 Anemia, unspecified: Secondary | ICD-10-CM

## 2021-06-26 DIAGNOSIS — I5043 Acute on chronic combined systolic (congestive) and diastolic (congestive) heart failure: Secondary | ICD-10-CM | POA: Diagnosis not present

## 2021-06-27 LAB — BASIC METABOLIC PANEL
BUN/Creatinine Ratio: 22 (ref 10–24)
BUN: 19 mg/dL (ref 10–36)
CO2: 23 mmol/L (ref 20–29)
Calcium: 8.7 mg/dL (ref 8.6–10.2)
Chloride: 101 mmol/L (ref 96–106)
Creatinine, Ser: 0.85 mg/dL (ref 0.76–1.27)
Glucose: 210 mg/dL — ABNORMAL HIGH (ref 65–99)
Potassium: 4.9 mmol/L (ref 3.5–5.2)
Sodium: 138 mmol/L (ref 134–144)
eGFR: 81 mL/min/{1.73_m2} (ref >59)

## 2021-07-03 DIAGNOSIS — Z125 Encounter for screening for malignant neoplasm of prostate: Secondary | ICD-10-CM | POA: Diagnosis not present

## 2021-07-03 DIAGNOSIS — E7800 Pure hypercholesterolemia, unspecified: Secondary | ICD-10-CM | POA: Diagnosis not present

## 2021-07-03 DIAGNOSIS — E1151 Type 2 diabetes mellitus with diabetic peripheral angiopathy without gangrene: Secondary | ICD-10-CM | POA: Diagnosis not present

## 2021-07-09 DIAGNOSIS — R82998 Other abnormal findings in urine: Secondary | ICD-10-CM | POA: Diagnosis not present

## 2021-07-09 DIAGNOSIS — Z0000 Encounter for general adult medical examination without abnormal findings: Secondary | ICD-10-CM | POA: Diagnosis not present

## 2021-07-09 DIAGNOSIS — E7800 Pure hypercholesterolemia, unspecified: Secondary | ICD-10-CM | POA: Diagnosis not present

## 2021-07-09 DIAGNOSIS — I5032 Chronic diastolic (congestive) heart failure: Secondary | ICD-10-CM | POA: Diagnosis not present

## 2021-07-09 DIAGNOSIS — I119 Hypertensive heart disease without heart failure: Secondary | ICD-10-CM | POA: Diagnosis not present

## 2021-07-09 DIAGNOSIS — I872 Venous insufficiency (chronic) (peripheral): Secondary | ICD-10-CM | POA: Diagnosis not present

## 2021-07-09 DIAGNOSIS — D582 Other hemoglobinopathies: Secondary | ICD-10-CM | POA: Diagnosis not present

## 2021-07-09 DIAGNOSIS — N401 Enlarged prostate with lower urinary tract symptoms: Secondary | ICD-10-CM | POA: Diagnosis not present

## 2021-07-09 DIAGNOSIS — J3489 Other specified disorders of nose and nasal sinuses: Secondary | ICD-10-CM | POA: Diagnosis not present

## 2021-07-09 DIAGNOSIS — Z23 Encounter for immunization: Secondary | ICD-10-CM | POA: Diagnosis not present

## 2021-07-09 DIAGNOSIS — E1151 Type 2 diabetes mellitus with diabetic peripheral angiopathy without gangrene: Secondary | ICD-10-CM | POA: Diagnosis not present

## 2021-08-05 ENCOUNTER — Ambulatory Visit: Payer: Medicare HMO | Admitting: Medical

## 2021-08-17 NOTE — Progress Notes (Deleted)
Cardiology Clinic Note   Patient Name: Dakota [name] Date of Encounter: 08/17/2021  Primary Care Provider:  [name], MD Primary Cardiologist:  [name], MD  Patient Profile    ***  Past Medical History    Past Medical History:  Diagnosis Date   Aortic atherosclerosis [facility])    Arthritis    Benign localized prostatic hyperplasia with lower urinary tract symptoms (LUTS)    Chronic chest pain    takes imdur   Chronic combined systolic (congestive) and diastolic (congestive) heart failure ([place])    followed by cardiology   Chronic dyspnea    Cognitive impairment    COPD, moderate (HCC)    Edema of both lower extremities    takes lasix and uses teds   First degree heart block    GERD (gastroesophageal reflux disease)    Heart murmur    History of cardiovascular stress test    Lexiscan Myoview 7/16:  EF 51%, inferior, inferolateral ischemia; Intermediate Risk   History of smallpox    HOH (hard of hearing)    even with hearing aids   Hypertension    Ischemic heart disease 2005   Dr [name] a. s/p CABG;  b.  LHC 05/23/15:  S-RPAVB ok, S-D2 ok, S-OM2 99% (s/p Synergy DES), L-LAD ok, EF normal (complicated by pseudoaneurysm)    LBBB (left bundle branch block)    Right arm weakness    S/P CABG x 4    01-08-2004   S/P drug eluting coronary stent placement    05-21-2015  PCI and DES x1 to SVG--OM graft   Thalassemia minor    Type 2 diabetes, diet controlled ([place])    Wears glasses    Wears hearing aid in both ears    Past Surgical History:  Procedure Laterality Date   [place]   in [place]   normal     CARDIAC CATHETERIZATION N/A 05/23/2015   Procedure: Left Heart Cath and Cors/Grafts Angiography;  Surgeon: Troy Sine, MD;  Location: [facility];  Service: Cardiovascular;  Laterality: N/A;   CARDIAC CATHETERIZATION N/A 05/23/2015   Procedure: Coronary Stent Intervention;  Surgeon: Troy Sine, MD;   Location: [facility];  Service: Cardiovascular;  Laterality: N/A;   CARDIAC CATHETERIZATION  01-04-2004   dr Cathie Olden  @[facility]    severe 3V CAD, normal LVF   CATARACT EXTRACTION W/ INTRAOCULAR LENS  IMPLANT, BILATERAL     CORONARY ARTERY BYPASS GRAFT  01-08-2004  dr hendrickson @[facility]    LIMA to LAD,  SVG to D1,  SVG to OM,  SVG to PDA   FOOT SURGERY Right    INGUINAL HERNIA REPAIR Bilateral 1978   MASS EXCISION Left 11/20/2019   Procedure: EXCISION OF SEBACEOUS CYST CHEST;  Surgeon: Kieth Brightly Arta Bruce, MD;  Location: [facility];  Service: General;  Laterality: Left;    Allergies  Allergies  Allergen Reactions   Aceon [Perindopril Erbumine] Shortness Of Breath and Cough   Sulfa Antibiotics Swelling   Pork-Derived Products     muslim    History of Present Illness    ***  Home Medications    Prior to Admission medications   Medication Sig Start Date End Date Taking? Authorizing Provider  albuterol (PROVENTIL HFA;VENTOLIN HFA) 108 (90 Base) MCG/ACT inhaler Inhale 2 puffs into the lungs every 4 (four) hours as needed for wheezing or shortness of breath. 01/03/16   Maryruth Eve  J, MD  amLODipine (NORVASC) 2.5 MG tablet Take 1 tablet by mouth once daily 04/14/21   Troy Sine, MD  apixaban (ELIQUIS) 2.5 MG TABS tablet Take by mouth 2 (two) times daily.    [provider]  docusate sodium (COLACE) 100 MG capsule Take 100 mg by mouth at bedtime.    [provider]  finasteride (PROSCAR) 5 MG tablet Take 5 mg by mouth at bedtime.     [provider]  furosemide (LASIX) 20 MG tablet Take 1 tablet (20 mg total) by mouth every other day. 06/06/21   Troy Sine, MD  isosorbide mononitrate (IMDUR) 30 MG 24 hr tablet Take 1 tablet (30 mg total) by mouth daily. 09/09/20   Troy Sine, MD  losartan (COZAAR) 25 MG tablet Take 1 tablet (25 mg total) by mouth daily. 06/06/21 09/04/21  Troy Sine, MD  mirabegron ER (MYRBETRIQ) 50 MG TB24 tablet  Take 50 mg by mouth at bedtime.     [provider]  Multiple Vitamin (MULTIVITAMIN WITH MINERALS) TABS tablet Take 1 tablet by mouth daily.    [provider]  Multiple Vitamins-Minerals (PRESERVISION AREDS 2 PO) Take 1 tablet by mouth 2 (two) times daily.     [provider]  NITROSTAT 0.4 MG SL tablet PLACE 1 TABLET UNDER THE TONGUE EVERY 5 MINUTES AS NEEDED FOR CHEST PAIN 03/28/21   Troy Sine, MD  ONE TOUCH ULTRA TEST test strip 1 each by Other route as needed (glucose monoring).  04/21/15   [provider]  pantoprazole (PROTONIX) 40 MG tablet Take 1 tablet (40 mg total) by mouth daily. Patient taking differently: Take 40 mg by mouth daily. 05/21/15   Richardson Dopp T, PA-C  pravastatin (PRAVACHOL) 80 MG tablet Take 80 mg by mouth at bedtime.    [provider]    Family History    Family History  Problem Relation Age of Onset   Healthy Father    Ulcers Mother    Stroke Mother    He indicated that his mother is deceased. He indicated that his father is deceased. He indicated that his sister is deceased. He indicated that his maternal grandmother is deceased. He indicated that his maternal grandfather is deceased. He indicated that his paternal grandmother is deceased. He indicated that his paternal grandfather is deceased.  Social History    Social History   Socioeconomic History   Marital status: Married    Spouse name: Not on file   Number of children: 2 d   Years of education: Not on file   Highest education level: Not on file  Occupational History   Occupation: accountant    Comment: [employer]  Tobacco Use   Smoking status: Never   Smokeless tobacco: Never  Vaping Use   Vaping Use: Never used  Substance and Sexual Activity   Alcohol use: No    Alcohol/week: 0.0 standard drinks   Drug use: No   Sexual activity: Not on file  Other Topics Concern   Not on file  Social History Narrative   Not on file   Social  Determinants of Health   Financial Resource Strain: Not on file  Food Insecurity: Not on file  Transportation Needs: Not on file  Physical Activity: Not on file  Stress: Not on file  Social Connections: Not on file  Intimate Partner Violence: Not on file     Review of Systems    General:  No chills, fever,  night sweats or weight changes.  Cardiovascular:  No chest pain, dyspnea on exertion, edema, orthopnea, palpitations, paroxysmal nocturnal dyspnea. Dermatological: No rash, lesions/masses Respiratory: No cough, dyspnea Urologic: No hematuria, dysuria Abdominal:   No nausea, vomiting, diarrhea, bright red blood per rectum, melena, or hematemesis Neurologic:  No visual changes, wkns, changes in mental status. All other systems reviewed and are otherwise negative except as noted above.  Physical Exam    VS:  There were no vitals taken for this visit. , BMI There is no height or weight on file to calculate BMI. GEN: Well nourished, well developed, in no acute distress. HEENT: normal. Neck: Supple, no JVD, carotid bruits, or masses. Cardiac: RRR, no murmurs, rubs, or gallops. No clubbing, cyanosis, edema.  Radials/DP/PT 2+ and equal bilaterally.  Respiratory:  Respirations regular and unlabored, clear to auscultation bilaterally. GI: Soft, nontender, nondistended, BS + x 4. MS: no deformity or atrophy. Skin: warm and dry, no rash. Neuro:  Strength and sensation are intact. Psych: Normal affect.  Accessory Clinical Findings    Recent Labs: 06/06/2021: ALT 13; BNP 528.9; Hemoglobin 10.5; Platelets 213; TSH 1.200 06/26/2021: BUN 19; Creatinine, Ser 0.85; Potassium 4.9; Sodium 138   Recent Lipid Panel    Component Value Date/Time   CHOL 126 02/24/2019 1058   TRIG 93 02/24/2019 1058   HDL 55 02/24/2019 1058   CHOLHDL 2.3 02/24/2019 1058   CHOLHDL 2.2 07/20/2017 0949   VLDL 10 09/07/2016 1027   LDLCALC 52 02/24/2019 1058   LDLCALC 45 07/20/2017 0949    ECG personally  reviewed by me today- *** - No acute changes  Assessment & Plan   1.  ***   Jossie Ng. Skylie Hiott NP-C    08/17/2021, 4:16 PM [facility] [address] Office [phone] Fax [phone]  Notice: This dictation was prepared with Dragon dictation along with smaller phrase technology. Any transcriptional errors that result from this process are unintentional and may not be corrected upon review.  I spent***minutes examining this patient, reviewing medications, and using patient centered shared decision making involving her cardiac care.  Prior to her visit I spent greater than 20 minutes reviewing her past medical history,  medications, and prior cardiac tests.

## 2021-08-19 ENCOUNTER — Ambulatory Visit: Payer: Medicare HMO | Admitting: General Practice

## 2021-08-27 ENCOUNTER — Other Ambulatory Visit: Payer: Self-pay | Admitting: Cardiovascular Disease

## 2021-09-02 NOTE — Progress Notes (Signed)
Cardiology Office Note:    Date:  09/12/2021   ID:  Dakota [name], DOB Oct 06, 1926, MRN [id number]  PCP:  [name], MD  Cardiologist:  [name], MD  Electrophysiologist:  None   Referring MD: [name], MD   Chief Complaint: routine follow-up of CAD, CHF, atrial fibrillation  History of Present Illness:    Dakota [name] is a 87 y.o. male with a history of CAD s/p CABG x4 in 2005 with subsequent PCI with DES to SVG to OM graft in 04/2015, chronic combined CHF with EF of 35-40% on Echo in 03/2021, persistent atrial fibrillation on Eliquis, LBBB, possible TIA I 01/2020, COPD, hypertension, hyperlipidemia, type 2 diabetes mellitus, anemia with thalassemia minor, and history of smallpox who is followed by Dr. [name] and presents today for routine follow-up.  Patient has a long history of CAD s/p CABG x4 (LIMA to LAD, SVG to Diag, SVG to OM, and SVG to distal RCA) in 2005. He developed recurrent symptoms concerning for unstable angina in 04/2015. Nuclear stress test was abnormal which led to a cardiac catheterization which showed a 99% stenosis of the distal anastomosis of the SVG to OM graft which was successfully treated with DES. Remainder of grafts were patent. Most recent Echo in 03/2021 showed LVEF of 35-40% with global hypokinesis, mildly enlarged RV with moderately enlarged RV systolic function, biatrial elargement, aortic sclerosis but no stenosis, severe MAC with mild MR, and moderate TR. EF had dropped from 50-55% in 2020.   Patient was last seen by Dr. [name] in 05/2021 at which time he continued to have venous stasis changes which were dependent mediated but was otherwise doing well from a cardiac standpoint. Given drop in EF on recent Echo, he was restarted on Losartan 41m daily. Lasix was decreased to 272mevery other day to avoid dropping his BP too low.   Patient presents today for follow-up. Here with daughter. He is doing well from a cardiac standpoint. He has chronic  dyspnea on exertion and 2 pillow orthopnea but this is stable. No PND. He lower extremity edema is well controlled on Lasix every other day. He denies any palpitations. His daughter states he will occasionally have some mild dizziness but it does not last long. No syncope. Daughter also states that he sometimes is very tired throughout the day and falls asleep easily but this does not occur all the time.   Past Medical History:  Diagnosis Date   Aortic atherosclerosis (HCC)    Arthritis    Benign localized prostatic hyperplasia with lower urinary tract symptoms (LUTS)    Chronic chest pain    takes imdur   Chronic combined systolic (congestive) and diastolic (congestive) heart failure (HCC)    followed by cardiology   Chronic dyspnea    Cognitive impairment    COPD, moderate (HCC)    Edema of both lower extremities    takes lasix and uses teds   First degree heart block    GERD (gastroesophageal reflux disease)    Heart murmur    History of cardiovascular stress test    Lexiscan Myoview 7/16:  EF 51%, inferior, inferolateral ischemia; Intermediate Risk   History of smallpox    HOH (hard of hearing)    even with hearing aids   Hypertension    Ischemic heart disease 2005   Dr KeClaiborne Billings. s/p CABG;  b.  LHC 05/23/15:  S-RPAVB ok, S-D2 ok, S-OM2 99% (s/p Synergy DES), L-LAD ok, EF normal (  complicated by pseudoaneurysm)    LBBB (left bundle branch block)    Right arm weakness    S/P CABG x 4    01-08-2004   S/P drug eluting coronary stent placement    05-21-2015  PCI and DES x1 to SVG--OM graft   Thalassemia minor    Type 2 diabetes, diet controlled ([place])    Wears glasses    Wears hearing aid in both ears     Past Surgical History:  Procedure Laterality Date   [place]   in [place]   normal     CARDIAC CATHETERIZATION N/A 05/23/2015   Procedure: Left Heart Cath and Cors/Grafts Angiography;  Surgeon: Troy Sine, MD;  Location: [facility];  Service: Cardiovascular;  Laterality: N/A;   CARDIAC CATHETERIZATION N/A 05/23/2015   Procedure: Coronary Stent Intervention;  Surgeon: Troy Sine, MD;  Location: [facility];  Service: Cardiovascular;  Laterality: N/A;   CARDIAC CATHETERIZATION  01-04-2004   dr Cathie Olden  _0    severe 3V CAD, normal LVF   CATARACT EXTRACTION W/ INTRAOCULAR LENS  IMPLANT, BILATERAL     CORONARY ARTERY BYPASS GRAFT  01-08-2004  dr hendrickson _1    LIMA to LAD,  SVG to D1,  SVG to OM,  SVG to PDA   FOOT SURGERY Right    INGUINAL HERNIA REPAIR Bilateral 1978   MASS EXCISION Left 11/20/2019   Procedure: EXCISION OF SEBACEOUS CYST CHEST;  Surgeon: Mickeal Skinner, MD;  Location: [facility];  Service: General;  Laterality: Left;    Current Medications: Current Meds  Medication Sig   albuterol (PROVENTIL HFA;VENTOLIN HFA) 108 (90 Base) MCG/ACT inhaler Inhale 2 puffs into the lungs every 4 (four) hours as needed for wheezing or shortness of breath.   amLODipine (NORVASC) 2.5 MG tablet Take 1 tablet by mouth once daily   apixaban (ELIQUIS) 2.5 MG TABS tablet Take by mouth 2 (two) times daily.   docusate sodium (COLACE) 100 MG capsule Take 100 mg by mouth at bedtime.   finasteride (PROSCAR) 5 MG tablet Take 5 mg by mouth at bedtime.    furosemide (LASIX) 20 MG tablet Take 1 tablet (20 mg total) by mouth every other day.   isosorbide mononitrate (IMDUR) 30 MG 24 hr tablet Take 1 tablet by mouth once daily   mirabegron ER (MYRBETRIQ) 50 MG TB24 tablet Take 50 mg by mouth at bedtime.    Multiple Vitamin (MULTIVITAMIN WITH MINERALS) TABS tablet Take 1 tablet by mouth daily.   Multiple Vitamins-Minerals (PRESERVISION AREDS 2 PO) Take 1 tablet by mouth 2 (two) times daily.    NITROSTAT 0.4 MG SL tablet PLACE 1 TABLET UNDER THE TONGUE EVERY 5 MINUTES AS NEEDED FOR CHEST PAIN   ONE TOUCH ULTRA TEST test strip 1 each by Other route as needed (glucose monoring).    pantoprazole  (PROTONIX) 40 MG tablet Take 1 tablet (40 mg total) by mouth daily. (Patient taking differently: Take 40 mg by mouth daily.)   pravastatin (PRAVACHOL) 80 MG tablet Take 80 mg by mouth at bedtime.   Current Facility-Administered Medications for the 09/12/21 encounter (Office Visit) with Darreld Mclean, PA-C  Medication   SOLUTION-PLUS STOPCOCK MISC     Allergies:   Aceon [perindopril erbumine], Sulfa antibiotics, and Pork-derived products   Social History   Socioeconomic History   Marital status: Married    Spouse name: Not on file   Number  of children: 2 d   Years of education: Not on file   Highest education level: Not on file  Occupational History   Occupation: accountant    Comment: [employer]  Tobacco Use   Smoking status: Never   Smokeless tobacco: Never  Vaping Use   Vaping Use: Never used  Substance and Sexual Activity   Alcohol use: No    Alcohol/week: 0.0 standard drinks   Drug use: No   Sexual activity: Not on file  Other Topics Concern   Not on file  Social History Narrative   Not on file   Social Determinants of Health   Financial Resource Strain: Not on file  Food Insecurity: Not on file  Transportation Needs: Not on file  Physical Activity: Not on file  Stress: Not on file  Social Connections: Not on file     Family History: The patient's family history includes Healthy in his father; Stroke in his mother; Ulcers in his mother.  ROS:   Please see the history of present illness.     EKGs/Labs/Other Studies Reviewed:    The following studies were reviewed today:  Left Cardiac Catheterization 05/23/2015: Mid LAD lesion, 100% stenosed. Ost Cx lesion, 100% stenosed. LM lesion, 95% stenosed. Prox RCA lesion, 50% stenosed. Post Atrio lesion, 100% stenosed. Dist RCA lesion, 50% stenosed. SVG was injected is normal in caliber, and is anatomically normal. SVG was injected is normal in caliber, and is anatomically normal. was injected is normal in  caliber. There is severe disease in the graft. Dist Graft to Insertion lesion, 99% stenosed. There is a 0% residual stenosis post intervention. A drug-eluting stent was placed. The left ventricular systolic function is normal.   Low normal global LV function with an ejection fraction of 50-55%.   Significant native CAD with 95% distal left main stenosis, proximal occlusion of the LAD after the first 2 septal perforating arteries; total occlusion of the circumflex at the ostium; 50% proximal RCA stenosis, 50% mid stenosis and total occlusion of the distal RCA after a small PDA-like vessel.   Patent LIMA graft supplying the mid LAD.   SVG supplying the circumflex marginal vessel with 99% distal anastomosis stenosis.   Patent SVG supplying the diagonal vessel.   Patent SVG apply the distal RCA with evidence for collateralization of the AV groove circumflex from the PLA vessel.   Successful PCI to the SVG to obtuse marginal vessel, treated with PTCA, Synergy DES stenting with a 3.016 mm stent postdilated to 3.43 mm in the very distal portion of the graft to 3.23 mm in the native marginal vessel with the stenosis being reduced to 0% and brisk 3 TIMI-3 flow.   Recommendation: Continued DAPT therapy and medical therapy for concomitant native CAD. _______________  Echocardiogram 07/19/2019: Impressions:  1. Left ventricular ejection fraction, by estimation, is 35 to 40%. The  left ventricle has moderately decreased function. The left ventricle  demonstrates global hypokinesis. Left ventricular diastolic function could  not be evaluated.   2. Right ventricular systolic function is moderately reduced. The right  ventricular size is mildly enlarged. There is mildly elevated pulmonary  artery systolic pressure.   3. Left atrial size was severely dilated.   4. Right atrial size was moderately dilated.   5. The mitral valve is abnormal. Mild mitral valve regurgitation. Severe  mitral annular  calcification.   6. Tricuspid valve regurgitation is moderate.   7. The aortic valve is calcified. There is severe calcifcation of the  aortic  valve. Aortic valve regurgitation is not visualized. Mild to  moderate aortic valve sclerosis/calcification is present, without any  evidence of aortic stenosis.   8. The inferior vena cava is normal in size with <50% respiratory  variability, suggesting right atrial pressure of 8 mmHg.   Comparison(s): Prior images reviewed side by side. Changes from prior  study are noted. EF reduced compared to prior study.   Conclusion(s)/Recommendation(s): EF reduced on current study compared to prior, with significant dyssynchrony. Both mitral and aortic valves are heavily calcified with restricted mobility. However, neither has  significantly elevated gradients.    EKG:  EKG not ordered today.   Recent Labs: 06/06/2021: ALT 13; BNP 528.9; Hemoglobin 10.5; Platelets 213; TSH 1.200 06/26/2021: BUN 19; Creatinine, Ser 0.85; Potassium 4.9; Sodium 138  Recent Lipid Panel    Component Value Date/Time   CHOL 126 02/24/2019 1058   TRIG 93 02/24/2019 1058   HDL 55 02/24/2019 1058   CHOLHDL 2.3 02/24/2019 1058   CHOLHDL 2.2 07/20/2017 0949   VLDL 10 09/07/2016 1027   LDLCALC 52 02/24/2019 1058   LDLCALC 45 07/20/2017 0949    Physical Exam:    Vital Signs: BP (!) 112/56   Pulse 78   Ht _0  (1.6 m)   Wt 106 lb 12.8 oz (48.4 kg)   SpO2 98%   BMI 18.92 kg/m     Wt Readings from Last 3 Encounters:  09/12/21 106 lb 12.8 oz (48.4 kg)  06/06/21 106 lb (48.1 kg)  11/29/20 112 lb 3.2 oz (50.9 kg)     General: 87 y.o. frail male in no acute distress. HEENT: Normocephalic and atraumatic. Sclera clear. EOMs intact. Neck: Supple. No carotid bruits. No JVD. Heart: Irregularly irregular rhythm with normal rate. Distinct S1 and S2. No murmurs, gallops, or rubs. Radial pulses 2+ and equal bilaterally. Lungs: No increased work of breathing. Clear to ausculation  bilaterally. No wheezes, rhonchi, or rales.  Abdomen: Soft, non-distended, and non-tender to palpation.  Extremities: No lower extremity edema.    Skin: Warm and dry. Neuro: Alert and oriented x3. No focal deficits. Psych: Normal affect. Responds appropriately.  Assessment:    1. Coronary artery disease involving native coronary artery of native heart without angina pectoris   2. Chronic systolic CHF (congestive heart failure) ([place])   3. Longstanding persistent atrial fibrillation ([place])   4. Primary hypertension   5. Hyperlipidemia, unspecified hyperlipidemia type   6. Type 2 diabetes mellitus with complication, without long-term current use of insulin (HCC)     Plan:    CAD - S/p CABG x4 in 2005 with subsequent PCI/DES to SVG to OM in 2016. - No angina.  - Continue antianginals: Amlodipine 2.31m daily and Imdur 383mdaily. - No Aspirin due to need for DOAC.  - Continue statin.  Chronic Systolic CHF - Most recent Echo in 06/2019 showed LVEF of 35-40% with global hypokinesis, mildly enlarged RV with moderately enlarged RV systolic function, biatrial elargement, aortic sclerosis but no stenosis,  severe MAC with mild MR, and moderate TR. - Euvolemic on exam.  - Continue Lasix 2052mvery other day. - Continue Losartan 30m50mily. - No beta-blocker due to history of bradycardia. - Continue daily weights and sodium/fluid restrictions.   Persistent Atrial Fibrillation - Rate controlled.  - Continue chronic anticoagulation with Eliquis 2.5mg 45mce daily. Reduced dosing due to age and weight.  Hypertension - BP well controlled. - Continue current medications: Amlodipine 2.5mg d55my, Losartan 30mg d11m, and Imdur 30mg da56m  Hyperlipidemia - Lipid panel in 06/2021: Total Cholesterol 121, Triglycerides 47, HDL 51, LDL 61.  - LDL goal <70 given CAD. - Continue Pravastatin 61m daily.  Type 2 Diabetes Mellitus - Management per PCP.   Disposition: Follow up in 4 months with Dr.  KClaiborne Billings   Medication Adjustments/Labs and Tests Ordered: Current medicines are reviewed at length with the patient today.  Concerns regarding medicines are outlined above.  No orders of the defined types were placed in this encounter.  No orders of the defined types were placed in this encounter.   Patient Instructions  Medication Instructions:  No medication changes. Continue current medications. *If you need a refill on your cardiac medications before your next appointment, please call your pharmacy*   Lab Work: None. If you have labs (blood work) drawn today and your tests are completely normal, you will receive your results only by: [place]if you have [place]Chart) OR A paper copy in the mail If you have any lab test that is abnormal or we need to change your treatment, we will call you to review the results.   Testing/Procedures: None.   Follow-Up: [facility] you and your health needs are our priority.  As part of our continuing mission to provide you with exceptional heart care, we have created designated Provider Care Teams.  These Care Teams include your primary Cardiologist (physician) and Advanced Practice Providers (APPs -  Physician Assistants and Nurse Practitioners) who all work together to provide you with the care you need, when you need it.  We recommend signing up for the patient portal called "MyChart".  Sign up information is provided on this After Visit Summary.  MyChart is used to connect with patients for Virtual Visits (Telemedicine).  Patients are able to view lab/test results, encounter notes, upcoming appointments, etc.  Non-urgent messages can be sent to your provider as well.   To learn more about what you can do with MyChart, go to [email or web address]    Your next appointment:   3-4 months  The format for your next appointment:   In Person  Provider:   TShelva Majestic MD {  Other Instructions Heart Failure Education: Weigh  yourself EVERY morning after you go to the bathroom but before you eat or drink anything. Write this number down in a weight log/diary. If you gain 3 pounds overnight or 5 pounds in a week, call the office. Take your medicines as prescribed. If you have concerns about your medications, please call [place]efore you stop taking them.  Eat low salt foods--Limit salt (sodium) to 2000 mg per day. This will help prevent your body from holding onto fluid. Read food labels as many processed foods have a lot of sodium, especially canned goods and prepackaged meats. If you would like some assistance choosing low sodium foods, we would be happy to set you up with a nutritionist. Limit all fluids for the day to less than 2 liters (64 ounces). Fluid includes all drinks, coffee, juice, ice chips, soup, jello, and all other liquids. Stay as active as you can everyday. Staying active will give you more energy and make your muscles stronger. Start with 5 minutes at a time and work your way up to 30 minutes a day. Break up your activities--do some in the morning and some in the afternoon. Start with 3 days per week and work your way up to 5 days as you can.  If you have chest pain, feel short of breath, dizzy,  or lightheaded, STOP. If you don't feel better after a short rest, call 911. If you do feel better, call the office to let us know you have symptoms with exercise.     Signed, Darreld Mclean, PA-C  09/12/2021 7:56 PM    [facility]

## 2021-09-12 ENCOUNTER — Other Ambulatory Visit: Payer: Self-pay

## 2021-09-12 ENCOUNTER — Encounter: Payer: Self-pay | Admitting: Student

## 2021-09-12 ENCOUNTER — Ambulatory Visit: Payer: Medicare HMO | Admitting: Student

## 2021-09-12 VITALS — BP 112/56 | HR 78 | Ht 63.0 in | Wt 106.8 lb

## 2021-09-12 DIAGNOSIS — I4811 Longstanding persistent atrial fibrillation: Secondary | ICD-10-CM | POA: Diagnosis not present

## 2021-09-12 DIAGNOSIS — I5022 Chronic systolic (congestive) heart failure: Secondary | ICD-10-CM | POA: Diagnosis not present

## 2021-09-12 DIAGNOSIS — E118 Type 2 diabetes mellitus with unspecified complications: Secondary | ICD-10-CM

## 2021-09-12 DIAGNOSIS — E785 Hyperlipidemia, unspecified: Secondary | ICD-10-CM

## 2021-09-12 DIAGNOSIS — I2510 Atherosclerotic heart disease of native coronary artery without angina pectoris: Secondary | ICD-10-CM | POA: Diagnosis not present

## 2021-09-12 DIAGNOSIS — I10 Essential (primary) hypertension: Secondary | ICD-10-CM | POA: Diagnosis not present

## 2021-09-12 DIAGNOSIS — J449 Chronic obstructive pulmonary disease, unspecified: Secondary | ICD-10-CM | POA: Diagnosis not present

## 2021-09-12 NOTE — Patient Instructions (Signed)
Medication Instructions:  No medication changes. Continue current medications. *If you need a refill on your cardiac medications before your next appointment, please call your pharmacy*   Lab Work: None. If you have labs (blood work) drawn today and your tests are completely normal, you will receive your results only by: [place] (if you have MyChart) OR A paper copy in the mail If you have any lab test that is abnormal or we need to change your treatment, we will call you to review the results.   Testing/Procedures: None.   Follow-Up: At [facility], you and your health needs are our priority.  As part of our continuing mission to provide you with exceptional heart care, we have created designated Provider Care Teams.  These Care Teams include your primary Cardiologist (physician) and Advanced Practice Providers (APPs -  Physician Assistants and Nurse Practitioners) who all work together to provide you with the care you need, when you need it.  We recommend signing up for the patient portal called "MyChart".  Sign up information is provided on this After Visit Summary.  MyChart is used to connect with patients for Virtual Visits (Telemedicine).  Patients are able to view lab/test results, encounter notes, upcoming appointments, etc.  Non-urgent messages can be sent to your provider as well.   To learn more about what you can do with MyChart, go to [email or web address].    Your next appointment:   3-4 months  The format for your next appointment:   In Person  Provider:   [name], MD {  Other Instructions Heart Failure Education: Weigh yourself EVERY morning after you go to the bathroom but before you eat or drink anything. Write this number down in a weight log/diary. If you gain 3 pounds overnight or 5 pounds in a week, call the office. Take your medicines as prescribed. If you have concerns about your medications, please call us before you stop taking them.   Eat low salt foods--Limit salt (sodium) to 2000 mg per day. This will help prevent your body from holding onto fluid. Read food labels as many processed foods have a lot of sodium, especially canned goods and prepackaged meats. If you would like some assistance choosing low sodium foods, we would be happy to set you up with a nutritionist. Limit all fluids for the day to less than 2 liters (64 ounces). Fluid includes all drinks, coffee, juice, ice chips, soup, jello, and all other liquids. Stay as active as you can everyday. Staying active will give you more energy and make your muscles stronger. Start with 5 minutes at a time and work your way up to 30 minutes a day. Break up your activities--do some in the morning and some in the afternoon. Start with 3 days per week and work your way up to 5 days as you can.  If you have chest pain, feel short of breath, dizzy, or lightheaded, STOP. If you don't feel better after a short rest, call 911. If you do feel better, call the office to let us know you have symptoms with exercise.

## 2021-10-01 ENCOUNTER — Telehealth: Payer: Self-pay | Admitting: Cardiovascular Disease

## 2021-10-01 ENCOUNTER — Other Ambulatory Visit: Payer: Self-pay

## 2021-10-01 MED ORDER — APIXABAN 2.5 MG PO TABS: 2.5000 mg | ORAL_TABLET | Freq: Two times a day (BID) | ORAL | 5 refills | Status: DC

## 2021-10-01 NOTE — Telephone Encounter (Signed)
  *  STAT* If patient is at the pharmacy, call can be transferred to refill team.   1. Which medications need to be refilled? (please list name of each medication and dose if known) apixaban (ELIQUIS) 2.5 MG TABS tablet  2. Which pharmacy/location (including street and city if local pharmacy) is medication to be sent to? [place], [place] - [address]  3. Do they need a 30 day or 90 day supply? 90 days  Pt will be out of meds tomorrow needs refill today

## 2021-10-01 NOTE — Telephone Encounter (Signed)
Prescription refill request for Eliquis received. Indication:Afib Last office visit:11/22 Scr:0.8 Age: 87 Weight:48.4 kg  Prescription refilled

## 2021-12-01 DIAGNOSIS — L600 Ingrowing nail: Secondary | ICD-10-CM | POA: Diagnosis not present

## 2021-12-01 DIAGNOSIS — M79675 Pain in left toe(s): Secondary | ICD-10-CM | POA: Diagnosis not present

## 2021-12-01 DIAGNOSIS — E1151 Type 2 diabetes mellitus with diabetic peripheral angiopathy without gangrene: Secondary | ICD-10-CM | POA: Diagnosis not present

## 2021-12-01 DIAGNOSIS — L603 Nail dystrophy: Secondary | ICD-10-CM | POA: Diagnosis not present

## 2021-12-01 DIAGNOSIS — B351 Tinea unguium: Secondary | ICD-10-CM | POA: Diagnosis not present

## 2021-12-01 DIAGNOSIS — M79674 Pain in right toe(s): Secondary | ICD-10-CM | POA: Diagnosis not present

## 2021-12-01 DIAGNOSIS — I739 Peripheral vascular disease, unspecified: Secondary | ICD-10-CM | POA: Diagnosis not present

## 2022-01-12 ENCOUNTER — Other Ambulatory Visit: Payer: Self-pay | Admitting: Cardiovascular Disease

## 2022-01-27 ENCOUNTER — Encounter: Payer: Self-pay | Admitting: Cardiovascular Disease

## 2022-01-27 ENCOUNTER — Ambulatory Visit: Payer: Medicare HMO | Admitting: Cardiovascular Disease

## 2022-01-27 VITALS — BP 126/62 | HR 82 | Ht 63.0 in | Wt 106.0 lb

## 2022-01-27 DIAGNOSIS — Z7901 Long term (current) use of anticoagulants: Secondary | ICD-10-CM | POA: Diagnosis not present

## 2022-01-27 DIAGNOSIS — I4811 Longstanding persistent atrial fibrillation: Secondary | ICD-10-CM

## 2022-01-27 DIAGNOSIS — I2510 Atherosclerotic heart disease of native coronary artery without angina pectoris: Secondary | ICD-10-CM

## 2022-01-27 DIAGNOSIS — Z951 Presence of aortocoronary bypass graft: Secondary | ICD-10-CM

## 2022-01-27 DIAGNOSIS — I5043 Acute on chronic combined systolic (congestive) and diastolic (congestive) heart failure: Secondary | ICD-10-CM | POA: Diagnosis not present

## 2022-01-27 DIAGNOSIS — E785 Hyperlipidemia, unspecified: Secondary | ICD-10-CM

## 2022-01-27 DIAGNOSIS — I447 Left bundle-branch block, unspecified: Secondary | ICD-10-CM

## 2022-01-27 DIAGNOSIS — I10 Essential (primary) hypertension: Secondary | ICD-10-CM | POA: Diagnosis not present

## 2022-01-27 DIAGNOSIS — I358 Other nonrheumatic aortic valve disorders: Secondary | ICD-10-CM | POA: Diagnosis not present

## 2022-01-27 DIAGNOSIS — J449 Chronic obstructive pulmonary disease, unspecified: Secondary | ICD-10-CM | POA: Diagnosis not present

## 2022-01-27 NOTE — Patient Instructions (Addendum)
Medication Instructions:  ? ?No changes ? ?*If you need a refill on your cardiac medications before your next appointment, please call your pharmacy* ? ? ?Lab Work: ?Lipid  ?CMP ?If you have labs (blood work) drawn today and your tests are completely normal, you will receive your results only by: ?MyChart Message (if you have MyChart) OR ?A paper copy in the mail ?If you have any lab test that is abnormal or we need to change your treatment, we will call you to review the results. ? ? ?Testing/Procedures: ? ?Not needed ? ?Follow-Up: ?At [facility], you and your health needs are our priority.  As part of our continuing mission to provide you with exceptional heart care, we have created designated Provider Care Teams.  These Care Teams include your primary Cardiologist (physician) and Advanced Practice Providers (APPs -  Physician Assistants and Nurse Practitioners) who all work together to provide you with the care you need, when you need it. ? ?  ? ?Your next appointment:   ?6 month(s) ? ?The format for your next appointment:   ?In Person ? ?Provider:   ?[name], MD  ? ? ? ?

## 2022-01-27 NOTE — Progress Notes (Addendum)
Patient ID: Dakota [name], male   DOB: [birth year], 88 y.o.   MRN: [id number]    Primary M.D.: Dr. Delfino Lovett Tisovic  HPI: Dakota [name] is a 88 year old  former patient of Dr. Mare Ferrari.  He presents for an 8 month follow-up evaluation.  [name] has CAD and underwent CABG revascularization surgery in 2005 with a LIMA to the LAD, SVG to the diagonal, SVG to the obtuse marginal, and SVG to his distal RCA.  He has been demonstrated to have combined systolic and diastolic heart failure, left bundle branch block and diabetes mellitus.  In 2013.  An echo Doppler study showed an EF of 50-55% with moderate diastolic dysfunction.  In July 2016 he developed symptoms worrisome for unstable angina.  A nuclear perfusion study was abnormal and demonstrated inferior to inferolateral ischemia.  He underwent cardiac catheterization by me on 05/21/2015.  This revealed low normal global LV function with an EF of 50-55%.  There was significant native CAD with 95% distal left main stenosis, proximal occlusion of the LAD after the first 2 septal perforating arteries, total occlusion of the circumflex at the ostium, and RCA stenoses, 50% proximally, 50% mid, and total occlusion of the distal RCA after a small PDA-like vessel.  He had a patent LIMA graft supplying the mid LAD.  The vein graft supplying the circumflex marginal had a 99% distal anastomosis stenosis.  He had a patent vein graft supplying the diagonal vessel and a pain vein graft supplying the distal RCA with some collateralization of the AV groove circumflex from the PLA vessel. I performed successful intervention to the vein graft supplying the obtuse marginal vessel and a 3.016 mm Synergy DES stent postdilated to 3.43 mm was inserted with resumption of brisk TIMI-3 flow and 0 residual stenosis.  This procedure was complicated by a small pseudoaneurysm which essentially self thrombosed and he did not require any thrombin injection.  He last saw Dr. Mare Ferrari in January  2017.   He established cardiology care with me in the office setting on 03/26/2016.  He denies any episodes of chest pain.  The patient has remained fairly active.  He still works at [employer] 4 days per week for 8 hours shifts.  He was treated for bronchitis.  He has noticed some mild shortness of breath with activity. He is unaware of palpitations.  He denies significant leg swelling.  He has not had recent blood work.   A repeat echo Doppler study on 11/04/2016 demonstrated normal systolic function with an EF of 50-55% with mild LVH and no regional wall motion abnormalities.  He had grade 2 diastolic dysfunction.  There was moderate aortic valve sclerosis without stenosis, moderate left atrial dilatation, mild right atrial dilatation, and mild pulmonary hypertension with a PA estimated pressure 36 mm.  6 months, he continues to do well.  He again denies recurrent anginal type symptoms.  I reviewed lab work done by Dr. Rosana Hoes on December 07, 2016.  Total cholesterol was 124, triglycerides 65, HDL 46, and LDL 65.  Glucose was 101.  Creatinine was 0.9.  When I last saw him in February 2019 he was continuing to remain stable from a cardiovascular standpoint. He was taken off Amaryl by Dr. Rosana Hoes.  Subsequent blood work  had shown his glucose increased at 141.  He was mildly anemic with a hemoglobin of 10.9, hematocrit 36.3.  Lipid studies were excellent with a total cholesterol 114, HDL 51, triglycerides 93, LDL 45.[name]  He continues  to work at [employer] from 9 AM until 6 PM on Thursday, Friday, Saturday, and Sundays.    He was hospitalized on June 15 and discharged April 11, 2018 with left-sided chest pain not nitrate responsive.  He was subsequently seen by Jory Sims, NP in the office on April 21, 2018 and there was some confusion concerning his medications.  At time losartan was discontinued and he was started on low-dose nitrate therapy.  I saw him in September 2020.  Over the prior several months he  had felt well.  He was taking furosemide 40 mg every other day isosorbide at dinnertime and icontinued to be on aspirin and Plavix.  He is on pravastatin 80 mg at bedtime.  He has noticed recently his blood pressure has been more increased.  He was no longer on the losartan.  At that time, his ECG showed sinus bradycardia with first-degree block and then transition into PAF with ventricular rate at 50.  He has chronic left bundle branch block.  I scheduled him for follow-up echo Doppler study.  I had not seen him since September 2020 but he was seen in follow-up on August 07, 2019 by Coletta Memos, NP.  His echo Doppler study from September 2020 showed an EF of 50 to 55%.  There was mild TR, aortic valve calcification with mild AR but no stenosis.  Apparently, in April 2021 there was a concern of possible TIA where his right arm suddenly did not work well with complete normalization.  He was last evaluated on July 31, 2020 by Rosaria Ferries, PA at that time, he denied any recurrent chest pain.  He denied any awareness of palpitations or heart rate irregularity.  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  However now he is able to sit in a chair and set his stand up all day.  He has noticed some mild ankle swelling bilaterally.  He denies chest pain.  He has continued to be on amlodipine 2.5 mg, furosemide 20 mg every other day, isosorbide 30 mg daily, and has been without anginal symptoms.  He is on pravastatin 80 mg for hyperlipidemia.  He is on low-dose Eliquis 2.5 mg twice a day for anticoagulation.  He is on pantoprazole for GERD.    He was seen by Dr. Martinique on November 21, 2020 with reports of increased dyspnea and edema for several weeks.  He had cough with some yellow phlegm production and had taken Lasix.  In that evaluation furosemide was increased to 40 mg twice a day.  He was instructed  that if he became worse he may be a candidate for possible thoracentesis as he was noted to have bilateral pleural effusion on chest x-ray.  He was felt to have acute on chronic combined heart failure.  He was seen on November 29, 2020 by Coletta Memos and was feeling better.  His ankle swelling had improved.  I  saw him on Mar 12, 2021.  He retired from [employer] in April 2022.  His edema had improved but he continued to have some dependent edema.  He denies any chest pain.  Medications included amlodipine 2.5 mg, furosemide 40 mg every other day, isosorbide 30 mg daily.  He is on Eliquis 2.5 mg twice a day for anticoagulation.  He also is on pravastatin 80 mg daily.  He had  bilateral inguinal hernias which were nontender.  In light of his recent CHF exacerbation, I recommended a follow-up echo Doppler study.  On April 14, 2021 his echo Doppler evaluation now showed moderate LV dysfunction with EF of 35 to 40% and global hypokinesis.  His right ventricle was mildly enlarged.  There was mild MR.  He had moderate right atrial and severe left atrial dilatation.  There was mild to moderate aortic sclerosis without stenosis.  When I last saw him on June 06, 2021 he denied any chest pain and continued to have dependent mediated venous stasis changes to his lower extremities.  He had not had recent laboratory.  He was takingfurosemide 40 mg every other day, amlodipine 2.5 mg daily, isosorbide 30 mg daily, in addition to Eliquis 2.5 mg twice a day on pravastatin 80 mg.  With his echo Doppler study showing reduced LV function I recommended resumption of low-dose losartan at 25 mg.  So that his blood pressure would not get too low I reduced furosemide to 20 mg every other day and repeat laboratory was recommended.  He continued to be on pravastatin for hyperlipidemia.  Presently, he feels well.  He has been very diligent with his diet and has been trying to reduce his sweets and carbohydrates.  He denies any chest  pain.  He denies any dizziness or lightheadedness or anginal symptoms.  There is no bleeding on anticoagulation.  He presents for reevaluation.  Past Medical History:  Diagnosis Date   Aortic atherosclerosis (HCC)    Arthritis    Benign localized prostatic hyperplasia with lower urinary tract symptoms (LUTS)    Chronic chest pain    takes imdur   Chronic combined systolic (congestive) and diastolic (congestive) heart failure (HCC)    followed by cardiology   Chronic dyspnea    Cognitive impairment    COPD, moderate (HCC)    Edema of both lower extremities    takes lasix and uses teds   First degree heart block    GERD (gastroesophageal reflux disease)    Heart murmur    History of cardiovascular stress test    Lexiscan Myoview 7/16:  EF 51%, inferior, inferolateral ischemia; Intermediate Risk   History of smallpox    HOH (hard of hearing)    even with hearing aids   Hypertension    Ischemic heart disease 2005   Dr [name] a. s/p CABG;  b.  LHC 05/23/15:  S-RPAVB ok, S-D2 ok, S-OM2 99% (s/p Synergy DES), L-LAD ok, EF normal (complicated by pseudoaneurysm)    LBBB (left bundle branch block)    Right arm weakness    S/P CABG x 4    01-08-2004   S/P drug eluting coronary stent placement    05-21-2015  PCI and DES x1 to SVG--OM graft   Thalassemia minor    Type 2 diabetes, diet controlled ([place])    Wears glasses    Wears hearing aid in both ears     Past Surgical History:  Procedure Laterality Date   [place]   normal     CARDIAC CATHETERIZATION N/A 05/23/2015   Procedure: Left Heart Cath and Cors/Grafts Angiography;  Surgeon: Troy Sine, MD;  Location: [facility];  Service: Cardiovascular;  Laterality: N/A;   CARDIAC CATHETERIZATION N/A 05/23/2015   Procedure: Coronary Stent Intervention;  Surgeon: Troy Sine, MD;  Location: [facility];  Service: Cardiovascular;  Laterality: N/A;   CARDIAC  CATHETERIZATION  01-04-2004   dr Cathie Olden  $Remo'@[facility]'CCVHh$    severe 3V CAD, normal LVF   CATARACT EXTRACTION W/ INTRAOCULAR LENS  IMPLANT, BILATERAL     CORONARY ARTERY BYPASS GRAFT  01-08-2004  dr hendrickson $RemoveBefore'@[facility]'wiGvCKxsQbWKE$    LIMA to LAD,  SVG to D1,  SVG to OM,  SVG to PDA   FOOT SURGERY Right    INGUINAL HERNIA REPAIR Bilateral 1978   MASS EXCISION Left 11/20/2019   Procedure: EXCISION OF SEBACEOUS CYST CHEST;  Surgeon: Kieth Brightly Arta Bruce, MD;  Location: [facility];  Service: General;  Laterality: Left;    Allergies  Allergen Reactions   Aceon [Perindopril Erbumine] Shortness Of Breath and Cough   Sulfa Antibiotics Swelling   Pork-Derived Products     muslim    Current Outpatient Medications  Medication Sig Dispense Refill   albuterol (PROVENTIL HFA;VENTOLIN HFA) 108 (90 Base) MCG/ACT inhaler Inhale 2 puffs into the lungs every 4 (four) hours as needed for wheezing or shortness of breath. 1 Inhaler 0   amLODipine (NORVASC) 2.5 MG tablet Take 1 tablet by mouth once daily 90 tablet 3   apixaban (ELIQUIS) 2.5 MG TABS tablet Take 1 tablet (2.5 mg total) by mouth 2 (two) times daily. 60 tablet 5   docusate sodium (COLACE) 100 MG capsule Take 100 mg by mouth at bedtime.     finasteride (PROSCAR) 5 MG tablet Take 5 mg by mouth at bedtime.      fluticasone (FLONASE) 50 MCG/ACT nasal spray Place into both nostrils.     furosemide (LASIX) 20 MG tablet Take 1 tablet (20 mg total) by mouth every other day. 30 tablet 2   isosorbide mononitrate (IMDUR) 30 MG 24 hr tablet Take 1 tablet by mouth once daily 90 tablet 1   losartan (COZAAR) 25 MG tablet Take 1 tablet (25 mg total) by mouth daily. 90 tablet 3   mirabegron ER (MYRBETRIQ) 50 MG TB24 tablet Take 50 mg by mouth at bedtime.      Multiple Vitamin (MULTIVITAMIN WITH MINERALS) TABS tablet Take 1 tablet by mouth daily.     Multiple Vitamins-Minerals (PRESERVISION AREDS 2 PO) Take 1 tablet by mouth 2 (two) times daily.      NITROSTAT 0.4 MG SL tablet  DISSOLVE ONE TABLET UNDER THE TONGUE EVERY 5 MINUTES AS NEEDED FOR CHEST PAIN. 25 tablet 2   ONE TOUCH ULTRA TEST test strip 1 each by Other route as needed (glucose monoring).      pantoprazole (PROTONIX) 40 MG tablet Take 1 tablet (40 mg total) by mouth daily. (Patient taking differently: Take 40 mg by mouth daily.) 90 tablet 3   PFIZER COVID-19 VAC BIVALENT injection      pravastatin (PRAVACHOL) 80 MG tablet Take 80 mg by mouth at bedtime.     Current Facility-Administered Medications  Medication Dose Route Frequency Provider Last Rate Last Admin   SOLUTION-PLUS STOPCOCK MISC   Does not apply Once Lorretta Harp, MD        Social History   Socioeconomic History   Marital status: Married    Spouse name: Not on file   Number of children: 2 d   Years of education: Not on file   Highest education level: Not on file  Occupational History   Occupation: accountant    Comment: [employer]  Tobacco Use   Smoking status: Never   Smokeless tobacco: Never  Vaping Use   Vaping Use: Never used  Substance  and Sexual Activity   Alcohol use: No    Alcohol/week: 0.0 standard drinks   Drug use: No   Sexual activity: Not on file  Other Topics Concern   Not on file  Social History Narrative   Not on file   Social Determinants of Health   Financial Resource Strain: Not on file  Food Insecurity: Not on file  Transportation Needs: Not on file  Physical Activity: Not on file  Stress: Not on file  Social Connections: Not on file  Intimate Partner Violence: Not on file    Additional social history is notable in that he had worked at the [place] [employer] for 30 years in the [place].  Family History  Problem Relation Age of Onset   Healthy Father    Ulcers Mother    Stroke Mother    Both parents are deceased.  ROS General: Negative; No fevers, chills, or night sweats HEENT: Uses a hearing aid; no visual changes sinus congestion, difficulty  swallowing Pulmonary: Positive for remote bronchitis Cardiovascular: See HPI: No chest pain, presyncope, syncope, palpitations; dependent edema with venous insufficiency GI: Negative; No nausea, vomiting, diarrhea, or abdominal pain GU: Negative; No dysuria, hematuria, or difficulty voiding Musculoskeletal: Negative; no myalgias, joint pain, or weakness Hematologic: Negative; no easy bruising, bleeding Endocrine: Positive for diabetes mellitus Neuro: Question transient TIA April 2021 Skin: Negative; No rashes or skin lesions Psychiatric: Negative; No behavioral problems, depression Sleep: Negative; No snoring,  daytime sleepiness, hypersomnolence, bruxism, restless legs, hypnogognic hallucinations. Other comprehensive 14 point system review is negative   Physical Exam BP 126/62   Pulse 82   Ht $R'5\' 3"'dz$  (1.6 m)   Wt 106 lb (48.1 kg)   SpO2 97%   BMI 18.78 kg/m    Repeat blood pressure by me was 124/64  Wt Readings from Last 3 Encounters:  01/27/22 106 lb (48.1 kg)  09/12/21 106 lb 12.8 oz (48.4 kg)  06/06/21 106 lb (48.1 kg)      Physical Exam BP 126/62   Pulse 82   Ht $R'5\' 3"'Ce$  (1.6 m)   Wt 106 lb (48.1 kg)   SpO2 97%   BMI 18.78 kg/m  General: Alert, oriented, no distress.  Skin: normal turgor, no rashes, warm and dry HEENT: Normocephalic, atraumatic. Pupils equal round and reactive to light; sclera anicteric; extraocular muscles intact;  Nose without nasal septal hypertrophy Mouth/Parynx benign; Mallinpatti scale 3 Neck: No JVD, no carotid bruits; normal carotid upstroke Lungs: clear to ausculatation and percussion; no wheezing or rales Chest wall: without tenderness to palpitation Heart: PMI not displaced, irregular irregular with controlled ventricular rate in the 80s consistent with his atrial fibrillation, s1 s2 normal, 2/6 systolic murmur, no diastolic murmur, no rubs, gallops, thrills, or heaves Abdomen: soft, nontender; no hepatosplenomehaly, BS+; abdominal aorta  nontender and not dilated by palpation. Back: no CVA tenderness Pulses 2+ Musculoskeletal: full range of motion, normal strength, no joint deformities Extremities: Slight bluish discoloration to his lower extremity with dependency; no clubbing cyanosis or edema, Homan's sign negative  Neurologic: grossly nonfocal; Cranial nerves grossly wnl Psychologic: Normal mood and affect   January 27, 2022 ECG (independently read by me): Atrial fibrillation at 85, LBBB  June 06, 2021 ECG (independently read by me): Atrial fibrillation at 76, LBBBB  May 2022 ECG (independently read by me): Atrial fibrilation at 81, LBBB  December 2021 ECG (independently read by me): Atrial fibrillation at 77, left bundle branch block  October 2020 ECG (independently read  by me): Sinus bradycardia with first-degree AV block.  Nonspecific intraventricular block.  First-degree AV block.  February 2019 ECG (independently read by me): Sinus bradycardia 56 bpm.  First-degree AV block with a PR interval at 242 ms.  Left bundle branch block.  May 2018 ECG (independently read by me): Sinus bradycardia at 59 bpm with PACs and sinus arrhythmia.  Left axis deviation.  Left bundle branch block with repolarization changes.  First-degree AV block with a PR interval at 288 ms.  QTc interval 449 ms.  November 2017 ECG (independently read by me): Probable sinus rhythm with interventricular conduction delay.  Heart rate 58 bpm.  June 2017 ECG (independently read by me): Normal sinus rhythm at 72 bpm with first-degree AV block.  PR interval 234 ms.  LABS:     Latest Ref Rng & Units 06/26/2021    2:10 PM 06/06/2021   11:29 AM 11/29/2020    4:01 PM  BMP  Glucose 65 - 99 mg/dL 210   116   161    BUN 10 - 36 mg/dL $Remove'19   27   17    'hoHoLKJ$ Creatinine 0.76 - 1.27 mg/dL 0.85   0.92   0.86    BUN/Creat Ratio 10 - $Re'24 22   29   20    'lrt$ Sodium 134 - 144 mmol/L 138   140   140    Potassium 3.5 - 5.2 mmol/L 4.9   4.8   3.8    Chloride 96 - 106 mmol/L 101    100   98    CO2 20 - 29 mmol/L $RemoveB'23   25   29    'FjfzIuyw$ Calcium 8.6 - 10.2 mg/dL 8.7   9.1   8.7          Latest Ref Rng & Units 06/06/2021   11:29 AM 02/24/2019   10:58 AM 07/20/2017    9:49 AM  Hepatic Function  Total Protein 6.0 - 8.5 g/dL 6.8   6.6   6.6    Albumin 3.5 - 4.6 g/dL 4.2   4.4     AST 0 - 40 IU/L $Remov'20   17   16    'Degewj$ ALT 0 - 44 IU/L $Remov'13   13   13    'bOKPBG$ Alk Phosphatase 44 - 121 IU/L 66   71     Total Bilirubin 0.0 - 1.2 mg/dL 0.5   0.7   0.7         Latest Ref Rng & Units 06/06/2021   11:29 AM 08/07/2020   11:58 AM 11/20/2019   12:54 PM  CBC  WBC 3.4 - 10.8 x10E3/uL 6.6   5.6     Hemoglobin 13.0 - 17.7 g/dL 10.5   10.2   10.9    Hematocrit 37.5 - 51.0 % 36.4   33.0   32.0    Platelets 150 - 450 x10E3/uL 213   206      Lab Results  Component Value Date   MCV 66 (L) 06/06/2021   MCV 62 (L) 08/07/2020   MCV 62 (L) 02/24/2019    Lab Results  Component Value Date   TSH 1.200 06/06/2021    BNP    Component Value Date/Time   BNP 528.9 (H) 06/06/2021 1129    ProBNP    Component Value Date/Time   PROBNP 137.0 (H) 06/10/2015 1504     Lipid Panel     Component Value Date/Time   CHOL 126 02/24/2019 1058   TRIG 93  02/24/2019 1058   HDL 55 02/24/2019 1058   CHOLHDL 2.3 02/24/2019 1058   CHOLHDL 2.2 07/20/2017 0949   VLDL 10 09/07/2016 1027   LDLCALC 52 02/24/2019 1058   LDLCALC 45 07/20/2017 0949     RADIOLOGY: No results found.  IMPRESSION:  1. Coronary artery disease involving native coronary artery of native heart without angina pectoris   2. Hx of CABG   3. Acute on chronic combined systolic and diastolic CHF (congestive heart failure) ([place])   4. Longstanding persistent atrial fibrillation ([place])   5. Essential hypertension   6. Hyperlipidemia with target LDL less than 70   7. Anticoagulation adequate   8. Left bundle branch block   9. Aortic valve sclerosis     ASSESSMENT AND PLAN: [name] is a 88 year-old gentleman who underwent CABG surgery in  2005.  He developed  unstable angina in July 2016 was found to have a 99% stenosis in the vein graft supplying the circumflex marginal vessel just at the distal anastomosis.  This was successfully stented.  He has not had any recurrent anginal therapy.  An echo Doppler study on 11/04/2016  done when he experienced some shortness of breath after an episode of bronchitis revealed mild LVH with EF of 50-55% and grade 2 diastolic dysfunction.  There was aortic valve sclerosis without stenosis, with biatrial enlargement and mild pulmonary hypertension with a PA pressure peaked 36 mm.  I had seen him in 2019, his blood pressure was elevated and I recommended titration of his losartan.  Apparently, ultimately his losartan was discontinued when he was started on nitrate therapy.  He has been in persistent atrial fibrillation and has continued to be on anticoagulation with Eliquis low-dose 2.5 mg twice a day.  There was some concern that he may have suffered a small TIA in April 2021 when he developed transient right arm weakness and transient inability to right with ultimately resolved.  He has had issues with acute on chronic diastolic heart failure and required increasing diuresis for lower extremity dependent edema.  Most recently he has been on amlodipine 2.5 mg, furosemide 40 mg every other day, isosorbide 30 mg daily.  His echo Doppler study from June 2022 demonstrated  reduced EF at 35 to 40% with global hypokinesis.  His right ventricle was mildly enlarged.  There was moderate right atrial and severe left atrial dilatation.  He had mild MR and mild to moderate aortic sclerosis without stenosis.  He is not having any anginal symptoms.  At his last evaluation in August 2022, low-dose ARB therapy was reinstituted with his reduced LV function.  He also had venous insufficiency and support stockings were recommended.  Presently, his blood pressure is stable.  He continues to be on low-dose amlodipine 2.5 mg, furosemide  20 mg every other day, losartan 25 mg daily, isosorbide 30 mg.  He is not having any anginal symptoms.  He is on pravastatin 80 mg for hyperlipidemia with lipid studies checked by Dr. Osborne Casco.  He has made significant alterations in his diet.  His atrial fibrillation rate is currently controlled and he is on low-dose anticoagulation with Eliquis 2.5 mg twice a day.  I will see him in 6 months for reevaluation or sooner as needed.  01/31/2022 11:33 AM

## 2022-01-31 ENCOUNTER — Encounter: Payer: Self-pay | Admitting: Cardiovascular Disease

## 2022-02-04 DIAGNOSIS — H353131 Nonexudative age-related macular degeneration, bilateral, early dry stage: Secondary | ICD-10-CM | POA: Diagnosis not present

## 2022-02-04 DIAGNOSIS — H40013 Open angle with borderline findings, low risk, bilateral: Secondary | ICD-10-CM | POA: Diagnosis not present

## 2022-02-04 DIAGNOSIS — Z961 Presence of intraocular lens: Secondary | ICD-10-CM | POA: Diagnosis not present

## 2022-02-04 DIAGNOSIS — H524 Presbyopia: Secondary | ICD-10-CM | POA: Diagnosis not present

## 2022-02-04 DIAGNOSIS — H18423 Band keratopathy, bilateral: Secondary | ICD-10-CM | POA: Diagnosis not present

## 2022-02-04 DIAGNOSIS — E119 Type 2 diabetes mellitus without complications: Secondary | ICD-10-CM | POA: Diagnosis not present

## 2022-02-13 ENCOUNTER — Telehealth: Payer: Self-pay | Admitting: *Deleted

## 2022-02-13 DIAGNOSIS — I2510 Atherosclerotic heart disease of native coronary artery without angina pectoris: Secondary | ICD-10-CM | POA: Diagnosis not present

## 2022-02-13 DIAGNOSIS — E785 Hyperlipidemia, unspecified: Secondary | ICD-10-CM | POA: Diagnosis not present

## 2022-02-13 DIAGNOSIS — I4811 Longstanding persistent atrial fibrillation: Secondary | ICD-10-CM | POA: Diagnosis not present

## 2022-02-13 NOTE — Telephone Encounter (Addendum)
-----   Message from Rollen Sox, [facility] sent at 02/13/2022  2:27 PM EDT ----- ?Regarding: pre op ?Patient's daughter walked in from [facility].  Says father is going to have spinal injection and needs to hold Eliquis.  Patient's daughter is Hancel Ion [phone] ? ? ?I called the pt's daughter. She tells me the pt has a procedure for a injection with Dr. Amedeo Plenty with [facility].  ? ?I then call Dr. Amedeo Plenty office and I was told they do not have anything for this pt on file. They had seen the pt in the past a number of years ago, but nothing recent.  ? ? ?I then called the pt's daughter back and stated that Dr. Amedeo Plenty has not seen her dad in a many years. She tells me today, that they were only asking a question if the pt wants to have an injection in his shoulder does he have to hold his Eliquis. I confirmed with the pt's daughter the pt is not having  any procedures at this time. I explained to her that if he does have an injection that DR. [place] office will send over a clearance. Pt's daughter said thank you.  ? ?

## 2022-02-14 LAB — COMPREHENSIVE METABOLIC PANEL
ALT: 11 IU/L (ref 0–44)
AST: 18 IU/L (ref 0–40)
Albumin/Globulin Ratio: 1.6 (ref 1.2–2.2)
Albumin: 4.2 g/dL (ref 3.5–4.6)
Alkaline Phosphatase: 65 IU/L (ref 44–121)
BUN/Creatinine Ratio: 23 (ref 10–24)
BUN: 16 mg/dL (ref 10–36)
Bilirubin Total: 0.9 mg/dL (ref 0.0–1.2)
CO2: 24 mmol/L (ref 20–29)
Calcium: 9.2 mg/dL (ref 8.6–10.2)
Chloride: 103 mmol/L (ref 96–106)
Creatinine, Ser: 0.71 mg/dL — ABNORMAL LOW (ref 0.76–1.27)
Globulin, Total: 2.6 g/dL (ref 1.5–4.5)
Glucose: 104 mg/dL — ABNORMAL HIGH (ref 70–99)
Potassium: 4.5 mmol/L (ref 3.5–5.2)
Sodium: 142 mmol/L (ref 134–144)
Total Protein: 6.8 g/dL (ref 6.0–8.5)
eGFR: 85 mL/min/{1.73_m2} (ref >59)

## 2022-02-14 LAB — LIPID PANEL
Chol/HDL Ratio: 2.2 ratio (ref 0.0–5.0)
Cholesterol, Total: 117 mg/dL (ref 100–199)
HDL: 53 mg/dL (ref >39)
LDL Chol Calc (NIH): 52 mg/dL (ref 0–99)
Triglycerides: 55 mg/dL (ref 0–149)
VLDL Cholesterol Cal: 12 mg/dL (ref 5–40)

## 2022-02-25 ENCOUNTER — Other Ambulatory Visit: Payer: Self-pay | Admitting: Cardiovascular Disease

## 2022-02-25 DIAGNOSIS — I5043 Acute on chronic combined systolic (congestive) and diastolic (congestive) heart failure: Secondary | ICD-10-CM

## 2022-02-26 ENCOUNTER — Other Ambulatory Visit: Payer: Self-pay | Admitting: Cardiovascular Disease

## 2022-04-12 ENCOUNTER — Other Ambulatory Visit: Payer: Self-pay | Admitting: Cardiovascular Disease

## 2022-04-13 NOTE — Telephone Encounter (Signed)
Prescription refill request for Eliquis received. Indication:Afib Last office visit:4/23 Scr:0.7 Age: 88 Weight:48.1 kg  Prescription refilled

## 2022-06-07 ENCOUNTER — Other Ambulatory Visit: Payer: Self-pay | Admitting: Cardiovascular Disease

## 2022-06-07 DIAGNOSIS — I5043 Acute on chronic combined systolic (congestive) and diastolic (congestive) heart failure: Secondary | ICD-10-CM

## 2022-06-08 DIAGNOSIS — I2510 Atherosclerotic heart disease of native coronary artery without angina pectoris: Secondary | ICD-10-CM | POA: Diagnosis not present

## 2022-06-08 DIAGNOSIS — I119 Hypertensive heart disease without heart failure: Secondary | ICD-10-CM | POA: Diagnosis not present

## 2022-06-08 DIAGNOSIS — E1151 Type 2 diabetes mellitus with diabetic peripheral angiopathy without gangrene: Secondary | ICD-10-CM | POA: Diagnosis not present

## 2022-06-08 DIAGNOSIS — M25561 Pain in right knee: Secondary | ICD-10-CM | POA: Diagnosis not present

## 2022-06-08 DIAGNOSIS — M25511 Pain in right shoulder: Secondary | ICD-10-CM | POA: Diagnosis not present

## 2022-06-08 DIAGNOSIS — M1990 Unspecified osteoarthritis, unspecified site: Secondary | ICD-10-CM | POA: Diagnosis not present

## 2022-06-08 DIAGNOSIS — M25562 Pain in left knee: Secondary | ICD-10-CM | POA: Diagnosis not present

## 2022-06-08 DIAGNOSIS — M25512 Pain in left shoulder: Secondary | ICD-10-CM | POA: Diagnosis not present

## 2022-06-08 DIAGNOSIS — R531 Weakness: Secondary | ICD-10-CM | POA: Diagnosis not present

## 2022-06-30 ENCOUNTER — Telehealth: Payer: Self-pay | Admitting: Cardiovascular Disease

## 2022-06-30 NOTE — Telephone Encounter (Signed)
Patient calling the office for samples of medication:   1.  What medication and dosage are you requesting samples for? apixaban (ELIQUIS) 2.5 MG TABS tablet  2.  Are you currently out of this medication?  No, pt has the rest of this week left. She states this medication is too expensive and insurance is not paying for it.

## 2022-07-02 NOTE — Telephone Encounter (Signed)
Daughter informed that samples of eliquis 2.'5mg'$  are set aside for him at our clinic for him to pick up. Application for PAP and discount card included. Patient is to take one tablet twice daily. Daughter verbalized understanding.

## 2022-07-02 NOTE — Telephone Encounter (Signed)
He can have some 2.5 mg samples - and a patient assistance form.

## 2022-08-07 DIAGNOSIS — M25511 Pain in right shoulder: Secondary | ICD-10-CM | POA: Diagnosis not present

## 2022-08-07 DIAGNOSIS — M25512 Pain in left shoulder: Secondary | ICD-10-CM | POA: Diagnosis not present

## 2022-08-17 ENCOUNTER — Telehealth: Payer: Self-pay | Admitting: Cardiovascular Disease

## 2022-08-17 NOTE — Telephone Encounter (Signed)
    Primary Cardiologist: [name], MD  Chart reviewed as part of pre-operative protocol coverage. Simple dental extractions are considered low risk procedures per guidelines and generally do not require any specific cardiac clearance. It is also generally accepted that for simple extractions and dental cleanings, there is no need to interrupt blood thinner therapy.   SBE prophylaxis is not required for the patient.  I will route this recommendation to the requesting party via Epic fax function and remove from pre-op pool.  Please call with questions.  Deberah Pelton, NP 08/17/2022, 2:51 PM

## 2022-08-17 NOTE — Telephone Encounter (Signed)
     Pre-operative Risk Assessment    Patient Name: Dakota [name]  DOB: 1926-04-14 MRN: [id number]     Request for Surgical Clearance    Procedure:  Dental Extraction - Amount of Teeth to be Pulled:  2 teeth  Date of Surgery:  Clearance 08/18/22                                 Surgeon:  Dr. Michael Litter Surgeon's Group or Practice Name:  [facility] Phone number:  [phone] Fax number:  [phone]   Type of Clearance Requested:   - Medical  - Pharmacy:  Hold Apixaban (Eliquis) defer to cards   Type of Anesthesia:  Local    Additional requests/questions:    Signed, Selinda Orion   08/17/2022, 2:36 PM

## 2022-08-18 NOTE — Telephone Encounter (Addendum)
Response faxed to oral surgeons office with update.

## 2022-08-18 NOTE — Telephone Encounter (Signed)
Spoke with Dwyane Luo at oral surgeons office who has a question. She states she did get our fax yesterday but the surgeon had an additional question. She states " the patient is having two teeth removed this morning and Dr Mancel Parsons wants to know if the patient continues to have persistent or heavy bleeding like tonight or tomorrow and we cant seem to have it clot or stop. Can the patient stop the Eliquis and if so for how many days?"  Dwyane Luo requested this information be faxed back to them at [phone]. I informed her that I would send the request the to provider and once I hear back we will be in ouch. She voiced understanding.

## 2022-08-18 NOTE — Telephone Encounter (Signed)
Caller would like to get written confirmation that, if they need to, they can stop the Eliquis if the patient has continual bleeding and for how long.  Patient will be having teeth extracted today.  Fax# [phone].

## 2022-08-18 NOTE — Telephone Encounter (Signed)
We do not recommend holding Eliquis for pulling of 2 teeth. If the patient has persistent bleeding they will need to contact our office for recommendations. They can call the office number and will be put through to the on call provider. Would not recommend holding Eliquis due to risk of stroke off anticoagulation.   Loel Dubonnet, NP

## 2022-08-26 ENCOUNTER — Ambulatory Visit: Payer: Medicare HMO | Attending: Cardiovascular Disease | Admitting: Cardiovascular Disease

## 2022-08-26 ENCOUNTER — Encounter: Payer: Self-pay | Admitting: Cardiovascular Disease

## 2022-08-26 DIAGNOSIS — I25119 Atherosclerotic heart disease of native coronary artery with unspecified angina pectoris: Secondary | ICD-10-CM | POA: Diagnosis not present

## 2022-08-26 DIAGNOSIS — I358 Other nonrheumatic aortic valve disorders: Secondary | ICD-10-CM | POA: Diagnosis not present

## 2022-08-26 DIAGNOSIS — I10 Essential (primary) hypertension: Secondary | ICD-10-CM

## 2022-08-26 DIAGNOSIS — Z951 Presence of aortocoronary bypass graft: Secondary | ICD-10-CM | POA: Diagnosis not present

## 2022-08-26 DIAGNOSIS — I4811 Longstanding persistent atrial fibrillation: Secondary | ICD-10-CM | POA: Diagnosis not present

## 2022-08-26 DIAGNOSIS — Z7901 Long term (current) use of anticoagulants: Secondary | ICD-10-CM

## 2022-08-26 DIAGNOSIS — E785 Hyperlipidemia, unspecified: Secondary | ICD-10-CM | POA: Diagnosis not present

## 2022-08-26 DIAGNOSIS — I5042 Chronic combined systolic (congestive) and diastolic (congestive) heart failure: Secondary | ICD-10-CM | POA: Diagnosis not present

## 2022-08-26 NOTE — Patient Instructions (Addendum)
Medication Instructions:  Decrease Furosemide to 20 mg every 3rd day Continue all other medications *If you need a refill on your cardiac medications before your next appointment, please call your pharmacy*   Lab Work: None ordered   Testing/Procedures: None ordered   Follow-Up: At [facility], you and your health needs are our priority.  As part of our continuing mission to provide you with exceptional heart care, we have created designated Provider Care Teams.  These Care Teams include your primary Cardiologist (physician) and Advanced Practice Providers (APPs -  Physician Assistants and Nurse Practitioners) who all work together to provide you with the care you need, when you need it.  We recommend signing up for the patient portal called "MyChart".  Sign up information is provided on this After Visit Summary.  MyChart is used to connect with patients for Virtual Visits (Telemedicine).  Patients are able to view lab/test results, encounter notes, upcoming appointments, etc.  Non-urgent messages can be sent to your provider as well.   To learn more about what you can do with MyChart, go to [email or web address].    Your next appointment:  6 months   Call in Feb to schedule May appointment     The format for your next appointment: Office    Provider: [facility]    Important Information About Sugar

## 2022-08-26 NOTE — Progress Notes (Signed)
Patient ID: Dakota [name], male   DOB: 07-06-1926, 88 y.o.   MRN: [id number]     Primary M.D.: Dr. Delfino Lovett Tisovic  HPI: Dakota [name] is a 88 year old  former patient of Dr. Mare Ferrari.  He presents for a 86monthfollow-up evaluation.  Dakota [name] and underwent CABG revascularization surgery in 2005 with a LIMA to the LAD, SVG to the diagonal, SVG to the obtuse marginal, and SVG to his distal RCA.  He has been demonstrated to have combined systolic and diastolic heart failure, left bundle branch block and diabetes mellitus.  In 2013.  An echo Doppler study showed an EF of 50-55% with moderate diastolic dysfunction.  In July 2016 he developed symptoms worrisome for unstable angina.  A nuclear perfusion study was abnormal and demonstrated inferior to inferolateral ischemia.  He underwent cardiac catheterization by me on 05/21/2015.  This revealed low normal global LV function with an EF of 50-55%.  There was significant native [name] with 95% distal left main stenosis, proximal occlusion of the LAD after the first 2 septal perforating arteries, total occlusion of the circumflex at the ostium, and RCA stenoses, 50% proximally, 50% mid, and total occlusion of the distal RCA after a small PDA-like vessel.  He had a patent LIMA graft supplying the mid LAD.  The vein graft supplying the circumflex marginal had a 99% distal anastomosis stenosis.  He had a patent vein graft supplying the diagonal vessel and a pain vein graft supplying the distal RCA with some collateralization of the AV groove circumflex from the PLA vessel. I performed successful intervention to the vein graft supplying the obtuse marginal vessel and a 3.016 mm Synergy DES stent postdilated to 3.43 mm was inserted with resumption of brisk TIMI-3 flow and 0 residual stenosis.  This procedure was complicated by a small pseudoaneurysm which essentially self thrombosed and he did not require any thrombin injection.  He last saw Dr. BMare Ferrariin January  2017.   He established cardiology care with me in the office setting on 03/26/2016.  He denies any episodes of chest pain.  The patient has remained fairly active.  He still works at [employer] days per week for 8 hours shifts.  He was treated for bronchitis.  He has noticed some mild shortness of breath with activity. He is unaware of palpitations.  He denies significant leg swelling.  He has not had recent blood work.   A repeat echo Doppler study on 11/04/2016 demonstrated normal systolic function with an EF of 50-55% with mild LVH and no regional wall motion abnormalities.  He had grade 2 diastolic dysfunction.  There was moderate aortic valve sclerosis without stenosis, moderate left atrial dilatation, mild right atrial dilatation, and mild pulmonary hypertension with a PA estimated pressure 36 mm.  6 months, he continues to do well.  He again denies recurrent anginal type symptoms.  I reviewed lab work done by Dr. TRosana Hoeson December 07, 2016.  Total cholesterol was 124, triglycerides 65, HDL 46, and LDL 65.  Glucose was 101.  Creatinine was 0.9.  When I last saw him in February 2019 he was continuing to remain stable from a cardiovascular standpoint. He was taken off Amaryl by Dr. TRosana Hoes  Subsequent blood work  had shown his glucose increased at 141.  He was mildly anemic with a hemoglobin of 10.9, hematocrit 36.3.  Lipid studies were excellent with a total cholesterol 114, HDL 51, triglycerides 93, LDL 45..[name] He continues  to work at [employer] from 9 AM until 6 PM on Thursday, Friday, Saturday, and Sundays.    He was hospitalized on June 15 and discharged April 11, 2018 with left-sided chest pain not nitrate responsive.  He was subsequently seen by Jory Sims, NP in the office on April 21, 2018 and there was some confusion concerning his medications.  At time losartan was discontinued and he was started on low-dose nitrate therapy.  I saw him in September 2020.  Over the prior several months he  had felt well.  He was taking furosemide 40 mg every other day isosorbide at dinnertime and icontinued to be on aspirin and Plavix.  He is on pravastatin 80 mg at bedtime.  He has noticed recently his blood pressure has been more increased.  He was no longer on the losartan.  At that time, his ECG showed sinus bradycardia with first-degree block and then transition into PAF with ventricular rate at 50.  He has chronic left bundle branch block.  I scheduled him for follow-up echo Doppler study.  I had not seen him since September 2020 but he was seen in follow-up on August 07, 2019 by Coletta Memos, NP.  His echo Doppler study from September 2020 showed an EF of 50 to 55%.  There was mild TR, aortic valve calcification with mild AR but no stenosis.  Apparently, in April 2021 there was a concern of possible TIA where his right arm suddenly did not work well with complete normalization.  He was last evaluated on July 31, 2020 by Rosaria Ferries, PA at that time, he denied any recurrent chest pain.  He denied any awareness of palpitations or heart rate irregularity.  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  However now he is able to sit in a chair and set his stand up all day.  He has noticed some mild ankle swelling bilaterally.  He denies chest pain.  He has continued to be on amlodipine 2.5 mg, furosemide 20 mg every other day, isosorbide 30 mg daily, and has been without anginal symptoms.  He is on pravastatin 80 mg for hyperlipidemia.  He is on low-dose Eliquis 2.5 mg twice a day for anticoagulation.  He is on pantoprazole for GERD.    He was seen by Dr. Martinique on November 21, 2020 with reports of increased dyspnea and edema for several weeks.  He had cough with some yellow phlegm production and had taken Lasix.  In that evaluation furosemide was increased to 40 mg twice a day.  He was instructed  that if he became worse he may be a candidate for possible thoracentesis as he was noted to have bilateral pleural effusion on chest x-ray.  He was felt to have acute on chronic combined heart failure.  He was seen on November 29, 2020 by Coletta Memos and was feeling better.  His ankle swelling had improved.  I saw him on Mar 12, 2021.  He retired from [employer] in April 2022.  His edema had improved but he continued to have some dependent edema.  He denies any chest pain.  Medications included amlodipine 2.5 mg, furosemide 40 mg every other day, isosorbide 30 mg daily.  He is on Eliquis 2.5 mg twice a day for anticoagulation.  He also is on pravastatin 80 mg daily.  He had bilateral  inguinal hernias which were nontender.  In light of his recent CHF exacerbation, I recommended a follow-up echo Doppler study.  On April 14, 2021 his echo Doppler evaluation now showed moderate LV dysfunction with EF of 35 to 40% and global hypokinesis.  His right ventricle was mildly enlarged.  There was mild MR.  He had moderate right atrial and severe left atrial dilatation.  There was mild to moderate aortic sclerosis without stenosis.  When I  saw him on June 06, 2021 he denied any chest pain and continued to have dependent mediated venous stasis changes to his lower extremities.  He had not had recent laboratory.  He was takingfurosemide 40 mg every other day, amlodipine 2.5 mg daily, isosorbide 30 mg daily, in addition to Eliquis 2.5 mg twice a day on pravastatin 80 mg.  With his echo Doppler study showing reduced LV function I recommended resumption of low-dose losartan at 25 mg.  So that his blood pressure would not get too low I reduced furosemide to 20 mg every other day and repeat laboratory was recommended.  He continued to be on pravastatin for hyperlipidemia.  I last saw him on January 27, 2022 which time he felt well.  He was being very diligent with his diet and was trying to reduce his sweets and carbohydrates.   He denied any chest pain, presyncope or syncope.  There was no bleeding on anticoagulation.    Since I last saw him, at times he does note some heartburn symptoms and some occasional shortness of breath.  He denies chest tightness.  He sees Dr. Osborne Casco for primary care.  He continues to be on low-dose Eliquis 2.5 mg twice a day for anticoagulation.  He has been taking furosemide 20 mg every other day, losartan 25 mg daily, isosorbide 30 mg daily in addition to amlodipine 2.5 mg for his [name] and hypertension.  He continues to take pravastatin 80 mg for hyperlipidemia.  GERD is controlled with pantoprazole.  He sees Dr. Osborne Casco who checks laboratory.  He presents for evaluation.  Past Medical History:  Diagnosis Date   Aortic atherosclerosis (HCC)    Arthritis    Benign localized prostatic hyperplasia with lower urinary tract symptoms (LUTS)    Chronic chest pain    takes imdur   Chronic combined systolic (congestive) and diastolic (congestive) heart failure (HCC)    followed by cardiology   Chronic dyspnea    Cognitive impairment    COPD, moderate (HCC)    Edema of both lower extremities    takes lasix and uses teds   First degree heart block    GERD (gastroesophageal reflux disease)    Heart murmur    History of cardiovascular stress test    Lexiscan Myoview 7/16:  EF 51%, inferior, inferolateral ischemia; Intermediate Risk   History of smallpox    HOH (hard of hearing)    even with hearing aids   Hypertension    Ischemic heart disease 2005   Dr [name] a. s/p CABG;  b.  LHC 05/23/15:  S-RPAVB ok, S-D2 ok, S-OM2 99% (s/p Synergy DES), L-LAD ok, EF normal (complicated by pseudoaneurysm)    LBBB (left bundle branch block)    Right arm weakness    S/P CABG x 4    01-08-2004   S/P drug eluting coronary stent placement    05-21-2015  PCI and DES x1 to SVG--OM graft   Thalassemia minor    Type 2 diabetes, diet controlled ([place])  Wears glasses    Wears hearing aid in both ears     Past  Surgical History:  Procedure Laterality Date   [place]   in [place]   normal     CARDIAC CATHETERIZATION N/A 05/23/2015   Procedure: Left Heart Cath and Cors/Grafts Angiography;  Surgeon: Troy Sine, MD;  Location: [facility];  Service: Cardiovascular;  Laterality: N/A;   CARDIAC CATHETERIZATION N/A 05/23/2015   Procedure: Coronary Stent Intervention;  Surgeon: Troy Sine, MD;  Location: [facility];  Service: Cardiovascular;  Laterality: N/A;   CARDIAC CATHETERIZATION  01-04-2004   dr Cathie Olden  _0    severe 3V [name], normal LVF   CATARACT EXTRACTION W/ INTRAOCULAR LENS  IMPLANT, BILATERAL     CORONARY ARTERY BYPASS GRAFT  01-08-2004  dr hendrickson _1    LIMA to LAD,  SVG to D1,  SVG to OM,  SVG to PDA   FOOT SURGERY Right    INGUINAL HERNIA REPAIR Bilateral 1978   MASS EXCISION Left 11/20/2019   Procedure: EXCISION OF SEBACEOUS CYST CHEST;  Surgeon: Kieth Brightly Arta Bruce, MD;  Location: [facility];  Service: General;  Laterality: Left;    Allergies  Allergen Reactions   Aceon [Perindopril Erbumine] Shortness Of Breath and Cough   Sulfa Antibiotics Swelling   Pork-Derived Products     muslim    Current Outpatient Medications  Medication Sig Dispense Refill   albuterol (PROVENTIL HFA;VENTOLIN HFA) 108 (90 Base) MCG/ACT inhaler Inhale 2 puffs into the lungs every 4 (four) hours as needed for wheezing or shortness of breath. 1 Inhaler 0   amLODipine (NORVASC) 2.5 MG tablet Take 1 tablet by mouth once daily 90 tablet 3   apixaban (ELIQUIS) 2.5 MG TABS tablet Take 1 tablet by mouth twice daily 60 tablet 5   docusate sodium (COLACE) 100 MG capsule Take 100 mg by mouth at bedtime.     finasteride (PROSCAR) 5 MG tablet Take 5 mg by mouth at bedtime.      fluticasone (FLONASE) 50 MCG/ACT nasal spray Place into both nostrils.     furosemide (LASIX) 20 MG tablet Take 20 mg every 3rd day 90 tablet 3   isosorbide  mononitrate (IMDUR) 30 MG 24 hr tablet Take 1 tablet by mouth once daily 90 tablet 3   losartan (COZAAR) 25 MG tablet Take 1 tablet by mouth once daily 90 tablet 3   mirabegron ER (MYRBETRIQ) 50 MG TB24 tablet Take 50 mg by mouth at bedtime.      Multiple Vitamin (MULTIVITAMIN WITH MINERALS) TABS tablet Take 1 tablet by mouth daily.     Multiple Vitamins-Minerals (PRESERVISION AREDS 2 PO) Take 1 tablet by mouth 2 (two) times daily.      NITROSTAT 0.4 MG SL tablet DISSOLVE ONE TABLET UNDER THE TONGUE EVERY 5 MINUTES AS NEEDED FOR CHEST PAIN. 25 tablet 2   ONE TOUCH ULTRA TEST test strip 1 each by Other route as needed (glucose monoring).      pantoprazole (PROTONIX) 40 MG tablet Take 1 tablet (40 mg total) by mouth daily. (Patient taking differently: Take 40 mg by mouth daily.) 90 tablet 3   PFIZER COVID-19 VAC BIVALENT injection      pravastatin (PRAVACHOL) 80 MG tablet Take 80 mg by mouth at bedtime.     Current Facility-Administered Medications  Medication Dose Route Frequency Provider Last Rate Last Admin   SOLUTION-PLUS STOPCOCK MISC   Does  not apply Once Lorretta Harp, MD        Social History   Socioeconomic History   Marital status: Married    Spouse name: Not on file   Number of children: 2 d   Years of education: Not on file   Highest education level: Not on file  Occupational History   Occupation: accountant    Comment: [employer]  Tobacco Use   Smoking status: Never   Smokeless tobacco: Never  Vaping Use   Vaping Use: Never used  Substance and Sexual Activity   Alcohol use: No    Alcohol/week: 0.0 standard drinks of alcohol   Drug use: No   Sexual activity: Not on file  Other Topics Concern   Not on file  Social History Narrative   Not on file   Social Determinants of Health   Financial Resource Strain: Not on file  Food Insecurity: Not on file  Transportation Needs: Not on file  Physical Activity: Not on file  Stress: Not on file  Social Connections: Not  on file  Intimate Partner Violence: Not on file    Additional social history is notable in that he had worked at the [place] [employer] for 30 years in the [place].  Family History  Problem Relation Age of Onset   Healthy Father    Ulcers Mother    Stroke Mother    Both parents are deceased.  ROS General: Negative; No fevers, chills, or night sweats HEENT: Uses a hearing aid; no visual changes sinus congestion, difficulty swallowing Pulmonary: Positive for remote bronchitis Cardiovascular: See HPI: No chest pain, presyncope, syncope, palpitations; dependent edema with venous insufficiency GI: Negative; No nausea, vomiting, diarrhea, or abdominal pain GU: Negative; No dysuria, hematuria, or difficulty voiding Musculoskeletal: Negative; no myalgias, joint pain, or weakness Hematologic: Negative; no easy bruising, bleeding Endocrine: Positive for diabetes mellitus Neuro: Question transient TIA April 2021 Skin: Negative; No rashes or skin lesions Psychiatric: Negative; No behavioral problems, depression Sleep: Negative; No snoring,  daytime sleepiness, hypersomnolence, bruxism, restless legs, hypnogognic hallucinations. Other comprehensive 14 point system review is negative   Physical Exam BP 98/66   Pulse 89   Ht _0  (1.6 m)   Wt 105 lb 9.6 oz (47.9 kg)   SpO2 97%   BMI 18.71 kg/m    Repeat blood pressure by me was 100/60  Wt Readings from Last 3 Encounters:  08/26/22 105 lb 9.6 oz (47.9 kg)  01/27/22 106 lb (48.1 kg)  09/12/21 106 lb 12.8 oz (48.4 kg)   General: Alert, oriented, no distress.  Skin: normal turgor, no rashes, warm and dry HEENT: Normocephalic, atraumatic. Pupils equal round and reactive to light; sclera anicteric; extraocular muscles intact; Nose without nasal septal hypertrophy Mouth/Parynx benign; Mallinpatti scale Neck: No JVD, no carotid bruits; normal carotid upstroke Lungs: clear to ausculatation and percussion; no  wheezing or rales Chest wall: without tenderness to palpitation Heart: PMI not displaced, irregular irregular with controlled rate in the 80s, s1 s2 normal, 2/6 systolic murmur, no diastolic murmur, no rubs, gallops, thrills, or heaves Abdomen: soft, nontender; no hepatosplenomehaly, BS+; abdominal aorta nontender and not dilated by palpation. Back: no CVA tenderness Pulses 2+ Musculoskeletal: full range of motion, normal strength, no joint deformities Extremities: no clubbing cyanosis or edema, Homan's sign negative  Neurologic: grossly nonfocal; Cranial nerves grossly wnl Psychologic: Normal mood and affect    August 26, 2022 ECG (independently read by me):  Atrial fibrillation at 89, inferior Q  waves  January 27, 2022 ECG (independently read by me): Atrial fibrillation at 85, LBBB  June 06, 2021 ECG (independently read by me): Atrial fibrillation at 76, LBBBB  May 2022 ECG (independently read by me): Atrial fibrilation at 81, LBBB  December 2021 ECG (independently read by me): Atrial fibrillation at 77, left bundle branch block  October 2020 ECG (independently read by me): Sinus bradycardia with first-degree AV block.  Nonspecific intraventricular block.  First-degree AV block.  February 2019 ECG (independently read by me): Sinus bradycardia 56 bpm.  First-degree AV block with a PR interval at 242 ms.  Left bundle branch block.  May 2018 ECG (independently read by me): Sinus bradycardia at 59 bpm with PACs and sinus arrhythmia.  Left axis deviation.  Left bundle branch block with repolarization changes.  First-degree AV block with a PR interval at 288 ms.  QTc interval 449 ms.  November 2017 ECG (independently read by me): Probable sinus rhythm with interventricular conduction delay.  Heart rate 58 bpm.  June 2017 ECG (independently read by me): Normal sinus rhythm at 72 bpm with first-degree AV block.  PR interval 234 ms.  LABS:     Latest Ref Rng & Units 02/13/2022   12:36 PM  06/26/2021    2:10 PM 06/06/2021   11:29 AM  BMP  Glucose 70 - 99 mg/dL 104  210  116   BUN 10 - 36 mg/dL _0 Creatinine 0.76 - 1.27 mg/dL 0.71  0.85  0.92   BUN/Creat Ratio 10 - _1 Sodium 134 - 144 mmol/L 142  138  140   Potassium 3.5 - 5.2 mmol/L 4.5  4.9  4.8   Chloride 96 - 106 mmol/L 103  101  100   CO2 20 - 29 mmol/L _2 Calcium 8.6 - 10.2 mg/dL 9.2  8.7  9.1         Latest Ref Rng & Units 02/13/2022   12:36 PM 06/06/2021   11:29 AM 02/24/2019   10:58 AM  Hepatic Function  Total Protein 6.0 - 8.5 g/dL 6.8  6.8  6.6   Albumin 3.5 - 4.6 g/dL 4.2  4.2  4.4   AST 0 - 40 IU/L _3 ALT 0 - 44 IU/L _4 Alk Phosphatase 44 - 121 IU/L 65  66  71   Total Bilirubin 0.0 - 1.2 mg/dL 0.9  0.5  0.7        Latest Ref Rng & Units 06/06/2021   11:29 AM 08/07/2020   11:58 AM 11/20/2019   12:54 PM  CBC  WBC 3.4 - 10.8 x10E3/uL 6.6  5.6    Hemoglobin 13.0 - 17.7 g/dL 10.5  10.2  10.9   Hematocrit 37.5 - 51.0 % 36.4  33.0  32.0   Platelets 150 - 450 x10E3/uL 213  206     Lab Results  Component Value Date   MCV 66 (L) 06/06/2021   MCV 62 (L) 08/07/2020   MCV 62 (L) 02/24/2019    Lab Results  Component Value Date   TSH 1.200 06/06/2021    BNP    Component Value Date/Time   BNP 528.9 (H) 06/06/2021 1129    ProBNP    Component Value Date/Time   PROBNP 137.0 (H) 06/10/2015 1504     Lipid Panel     Component Value  Date/Time   CHOL 117 02/13/2022 1236   TRIG 55 02/13/2022 1236   HDL 53 02/13/2022 1236   CHOLHDL 2.2 02/13/2022 1236   CHOLHDL 2.2 07/20/2017 0949   VLDL 10 09/07/2016 1027   LDLCALC 52 02/13/2022 1236   LDLCALC 45 07/20/2017 0949     RADIOLOGY: No results found.  IMPRESSION:  1. Coronary artery disease involving native coronary artery of native heart with angina pectoris ([place])   2. Hx of CABG   3. Chronic combined systolic and diastolic congestive heart failure ([place])   4. Essential hypertension   5.  Longstanding persistent atrial fibrillation ([place])   6. Hyperlipidemia with target LDL less than 70   7. Anticoagulation adequate   8. Aortic valve sclerosis     ASSESSMENT AND PLAN: [name] who underwent CABG surgery in 2005.  He developed  unstable angina in July 2016 was found to have a 99% stenosis in the vein graft supplying the circumflex marginal vessel just at the distal anastomosis.  This was successfully stented.  He has not had any recurrent anginal therapy.  An echo Doppler study on 11/04/2016  done when he experienced some shortness of breath after an episode of bronchitis revealed mild LVH with EF of 50-55% and grade 2 diastolic dysfunction.  There was aortic valve sclerosis without stenosis, with biatrial enlargement and mild pulmonary hypertension with a PA pressure peaked 36 mm.  I had seen him in 2019, his blood pressure was elevated and I recommended titration of his losartan.  Apparently, ultimately his losartan was discontinued when he was started on nitrate therapy.  He has been in persistent atrial fibrillation and has continued to be on anticoagulation with Eliquis low-dose 2.5 mg twice a day.  There was some concern that he may have suffered a small TIA in April 2021 when he developed transient right arm weakness and transient inability to right with ultimately resolved.  He has had issues with acute on chronic diastolic heart failure and required increasing diuresis for lower extremity dependent edema.  His echo Doppler study from June 2022 demonstrated  reduced EF at 35 to 40% with global hypokinesis.  His right ventricle was mildly enlarged.  There was moderate right atrial and severe left atrial dilatation.  He had mild MR and mild to moderate aortic sclerosis without stenosis.  He is not having any anginal symptoms.  At his evaluation in August 2022, low-dose ARB therapy was reinstituted with his reduced LV function.  He also had venous insufficiency  and support stockings were recommended.  Presently, his blood pressure today is low initially at 98/66 and a repeat by me 100/60.  He has continued being on amlodipine 2.5 mg, furosemide 20 mg every other day, losartan 25 mg daily, and isosorbide 30 mg daily both for his blood pressure and [name].  He is without anginal symptoms.  With his low blood pressure and no edema I have recommended he decrease Lasix to every third day.  I provided him with samples of Eliquis which she continues to be on for his atrial fibrillation.  He continues to be on pravastatin 80 mg for hyperlipidemia.  LDL cholesterol in April 2023 was excellent at 52.  He will be following up with Dr. Osborne Casco.  I will see him in next months for reevaluation.   08/28/2022 11:30 AM

## 2022-08-28 ENCOUNTER — Encounter: Payer: Self-pay | Admitting: Cardiovascular Disease

## 2022-09-16 ENCOUNTER — Telehealth: Payer: Self-pay | Admitting: Cardiovascular Disease

## 2022-09-16 NOTE — Telephone Encounter (Signed)
Samples provided. Called patient notified office was closed tomorrow and Friday- pick up would need to be Today or Monday.   Patient wife verbalized understanding.

## 2022-09-16 NOTE — Telephone Encounter (Signed)
Yes, ok for 2.'5mg'$  Eliquis samples

## 2022-09-16 NOTE — Telephone Encounter (Signed)
Patient calling the office for samples of medication:   1.  What medication and dosage are you requesting samples for?   apixaban (ELIQUIS) 2.5 MG TABS tablet    2.  Are you currently out of this medication? He has enough till Sunday

## 2022-10-08 DIAGNOSIS — H40013 Open angle with borderline findings, low risk, bilateral: Secondary | ICD-10-CM | POA: Diagnosis not present

## 2022-10-08 DIAGNOSIS — H04123 Dry eye syndrome of bilateral lacrimal glands: Secondary | ICD-10-CM | POA: Diagnosis not present

## 2022-10-10 ENCOUNTER — Encounter: Payer: Self-pay | Admitting: Cardiovascular Disease

## 2022-10-13 NOTE — Telephone Encounter (Signed)
Daughter informed that 4 boxes of eliquis 2.'5mg'$  twice daily samples have been set aside for patient. Lot #: [id number];  Exp: 4/24

## 2022-11-04 ENCOUNTER — Other Ambulatory Visit: Payer: Self-pay | Admitting: Pharmacist

## 2022-11-04 ENCOUNTER — Encounter: Payer: Self-pay | Admitting: Cardiovascular Disease

## 2022-11-04 MED ORDER — APIXABAN 2.5 MG PO TABS: 2.5000 mg | ORAL_TABLET | Freq: Two times a day (BID) | ORAL | 5 refills | Status: DC

## 2022-11-11 DIAGNOSIS — I739 Peripheral vascular disease, unspecified: Secondary | ICD-10-CM | POA: Diagnosis not present

## 2022-11-11 DIAGNOSIS — E1151 Type 2 diabetes mellitus with diabetic peripheral angiopathy without gangrene: Secondary | ICD-10-CM | POA: Diagnosis not present

## 2022-11-11 DIAGNOSIS — L603 Nail dystrophy: Secondary | ICD-10-CM | POA: Diagnosis not present

## 2022-11-11 DIAGNOSIS — E1142 Type 2 diabetes mellitus with diabetic polyneuropathy: Secondary | ICD-10-CM | POA: Diagnosis not present

## 2022-11-23 ENCOUNTER — Encounter: Payer: Self-pay | Admitting: Cardiovascular Disease

## 2022-11-23 DIAGNOSIS — R7989 Other specified abnormal findings of blood chemistry: Secondary | ICD-10-CM | POA: Diagnosis not present

## 2022-11-23 DIAGNOSIS — R82998 Other abnormal findings in urine: Secondary | ICD-10-CM | POA: Diagnosis not present

## 2022-11-23 DIAGNOSIS — E1151 Type 2 diabetes mellitus with diabetic peripheral angiopathy without gangrene: Secondary | ICD-10-CM | POA: Diagnosis not present

## 2022-11-23 DIAGNOSIS — K219 Gastro-esophageal reflux disease without esophagitis: Secondary | ICD-10-CM | POA: Diagnosis not present

## 2022-11-23 DIAGNOSIS — E7800 Pure hypercholesterolemia, unspecified: Secondary | ICD-10-CM | POA: Diagnosis not present

## 2022-11-23 DIAGNOSIS — Z125 Encounter for screening for malignant neoplasm of prostate: Secondary | ICD-10-CM | POA: Diagnosis not present

## 2022-11-24 ENCOUNTER — Telehealth: Payer: Self-pay

## 2022-11-24 NOTE — Telephone Encounter (Signed)
Contacted patient daughter (okay per DPR) see mychart messages. Patient was complaining of left leg swelling, complaints of redness. Not hot to touch, not painful (original message stated pain, but daughter advised her dad verbalized it was not) denies any other symptoms. No weight gain. Continues medications including Eliquis 2.5 mg twice daily. Does not improve with elevation. No swelling located in right leg, only left. No cuts/abrasions to rule out cellulitis.   Patient scheduled for DOD visit tomorrow- daughter thankful for call back.   No recent leg scans to rule out vascular cases- last lower extremity scans completed 2016.  Did advise if leg continued to worsen to seek emergency attention. They verbalized understanding  Will route to primary cards [facility]) and DOD (Dr.Tobb)  Thanks!

## 2022-11-25 ENCOUNTER — Ambulatory Visit: Payer: Medicare HMO | Attending: Cardiology | Admitting: Cardiology

## 2022-11-25 ENCOUNTER — Encounter: Payer: Self-pay | Admitting: Cardiology

## 2022-11-25 VITALS — BP 106/70 | HR 81 | Ht 63.0 in | Wt 106.7 lb

## 2022-11-25 DIAGNOSIS — Z79899 Other long term (current) drug therapy: Secondary | ICD-10-CM

## 2022-11-25 DIAGNOSIS — I25119 Atherosclerotic heart disease of native coronary artery with unspecified angina pectoris: Secondary | ICD-10-CM | POA: Diagnosis not present

## 2022-11-25 DIAGNOSIS — I5042 Chronic combined systolic (congestive) and diastolic (congestive) heart failure: Secondary | ICD-10-CM | POA: Diagnosis not present

## 2022-11-25 DIAGNOSIS — I447 Left bundle-branch block, unspecified: Secondary | ICD-10-CM | POA: Diagnosis not present

## 2022-11-25 NOTE — Progress Notes (Signed)
Cardiology Office Note:    Date:  11/29/2022   ID:  Dakota [name], DOB [birth year], MRN [id number]  PCP:  [name], MD  Cardiologist:  [name], MD  Electrophysiologist:  None   Referring MD: [name], MD   " Left leg swelling"  History of Present Illness:    Dakota [name] is a 89 y.o. male with a hx of CAD s/p CABG in 2005, heart failure with preserved ejection fraction,LBBB, hypertension.   The patient daughter called yesterday reporting that he had some leg swelling.  He is here in the office today.  And notes that he is got some left ankle swelling.  He denies any shortness of breath, chest pain, lightheadedness dizziness or leg pain.  Past Medical History:  Diagnosis Date   Aortic atherosclerosis (HCC)    Arthritis    Benign localized prostatic hyperplasia with lower urinary tract symptoms (LUTS)    Chronic chest pain    takes imdur   Chronic combined systolic (congestive) and diastolic (congestive) heart failure (HCC)    followed by cardiology   Chronic dyspnea    Cognitive impairment    COPD, moderate (HCC)    Edema of both lower extremities    takes lasix and uses teds   First degree heart block    GERD (gastroesophageal reflux disease)    Heart murmur    History of cardiovascular stress test    Lexiscan Myoview 7/16:  EF 51%, inferior, inferolateral ischemia; Intermediate Risk   History of smallpox    HOH (hard of hearing)    even with hearing aids   Hypertension    Ischemic heart disease 2005   Dr [name] a. s/p CABG;  b.  LHC 05/23/15:  S-RPAVB ok, S-D2 ok, S-OM2 99% (s/p Synergy DES), L-LAD ok, EF normal (complicated by pseudoaneurysm)    LBBB (left bundle branch block)    Right arm weakness    S/P CABG x 4    01-08-2004   S/P drug eluting coronary stent placement    05-21-2015  PCI and DES x1 to SVG--OM graft   Thalassemia minor    Type 2 diabetes, diet controlled ([place])    Wears glasses    Wears hearing aid in both ears     Past  Surgical History:  Procedure Laterality Date   [place]   in [place]   normal     CARDIAC CATHETERIZATION N/A 05/23/2015   Procedure: Left Heart Cath and Cors/Grafts Angiography;  Surgeon: Troy Sine, MD;  Location: [facility];  Service: Cardiovascular;  Laterality: N/A;   CARDIAC CATHETERIZATION N/A 05/23/2015   Procedure: Coronary Stent Intervention;  Surgeon: Troy Sine, MD;  Location: [facility];  Service: Cardiovascular;  Laterality: N/A;   CARDIAC CATHETERIZATION  01-04-2004   dr Cathie Olden  '@[facility]    severe 3V CAD, normal LVF   CATARACT EXTRACTION W/ INTRAOCULAR LENS  IMPLANT, BILATERAL     CORONARY ARTERY BYPASS GRAFT  01-08-2004  dr hendrickson '@[facility]    LIMA to LAD,  SVG to D1,  SVG to OM,  SVG to PDA   FOOT SURGERY Right    INGUINAL HERNIA REPAIR Bilateral 1978   MASS EXCISION Left 11/20/2019   Procedure: EXCISION OF SEBACEOUS CYST CHEST;  Surgeon: Kieth Brightly Arta Bruce, MD;  Location: [facility];  Service: General;  Laterality: Left;    Current Medications: Current Meds  Medication Sig  albuterol (PROVENTIL HFA;VENTOLIN HFA) 108 (90 Base) MCG/ACT inhaler Inhale 2 puffs into the lungs every 4 (four) hours as needed for wheezing or shortness of breath.   amLODipine (NORVASC) 2.5 MG tablet Take 1 tablet by mouth once daily   apixaban (ELIQUIS) 2.5 MG TABS tablet Take 1 tablet (2.5 mg total) by mouth 2 (two) times daily.   Blood Glucose Monitoring Suppl (ONE TOUCH ULTRA 2) w/Device KIT daily in the afternoon.   chlorhexidine (PERIDEX) 0.12 % solution 15 mLs 2 (two) times daily.   docusate sodium (COLACE) 100 MG capsule Take 100 mg by mouth at bedtime.   finasteride (PROSCAR) 5 MG tablet Take 5 mg by mouth at bedtime.    fluticasone (FLONASE) 50 MCG/ACT nasal spray Place into both nostrils.   furosemide (LASIX) 20 MG tablet Take 20 mg every 3rd day   isosorbide mononitrate (IMDUR) 30 MG 24 hr tablet Take 1  tablet by mouth once daily   losartan (COZAAR) 25 MG tablet Take 1 tablet by mouth once daily   mirabegron ER (MYRBETRIQ) 50 MG TB24 tablet Take 50 mg by mouth at bedtime.    Multiple Vitamin (MULTIVITAMIN WITH MINERALS) TABS tablet Take 1 tablet by mouth daily.   Multiple Vitamins-Minerals (PRESERVISION AREDS 2 PO) Take 1 tablet by mouth 2 (two) times daily.    ONE TOUCH ULTRA TEST test strip 1 each by Other route as needed (glucose monoring).    pantoprazole (PROTONIX) 40 MG tablet Take 1 tablet (40 mg total) by mouth daily. (Patient taking differently: Take 40 mg by mouth daily.)   pravastatin (PRAVACHOL) 80 MG tablet Take 80 mg by mouth at bedtime.   predniSONE (DELTASONE) 10 MG tablet Take 10 mg by mouth.   Current Facility-Administered Medications for the 11/25/22 encounter (Office Visit) with Berniece Salines, DO  Medication   SOLUTION-PLUS STOPCOCK MISC     Allergies:   Aceon [perindopril erbumine], Sulfa antibiotics, and Pork-derived products   Social History   Socioeconomic History   Marital status: Married    Spouse name: Not on file   Number of children: 2 d   Years of education: Not on file   Highest education level: Not on file  Occupational History   Occupation: accountant    Comment: [employer]  Tobacco Use   Smoking status: Never   Smokeless tobacco: Never  Vaping Use   Vaping Use: Never used  Substance and Sexual Activity   Alcohol use: No    Alcohol/week: 0.0 standard drinks of alcohol   Drug use: No   Sexual activity: Not on file  Other Topics Concern   Not on file  Social History Narrative   Not on file   Social Determinants of Health   Financial Resource Strain: Not on file  Food Insecurity: Not on file  Transportation Needs: Not on file  Physical Activity: Not on file  Stress: Not on file  Social Connections: Not on file     Family History: The patient's family history includes Healthy in his father; Stroke in his mother; Ulcers in his  mother.  ROS:   Review of Systems  Constitution: Negative for decreased appetite, fever and weight gain.  HENT: Negative for congestion, ear discharge, hoarse voice and sore throat.   Eyes: Negative for discharge, redness, vision loss in right eye and visual halos.  Cardiovascular: reports left leg swelling.Negative for chest pain, dyspnea on exertion,orthopnea and palpitations.  Respiratory: Negative for cough, hemoptysis, shortness of breath and snoring.   Endocrine: Negative  for heat intolerance and polyphagia.  Hematologic/Lymphatic: Negative for bleeding problem. Does not bruise/bleed easily.  Skin: Negative for flushing, nail changes, rash and suspicious lesions.  Musculoskeletal: Negative for arthritis, joint pain, muscle cramps, myalgias, neck pain and stiffness.  Gastrointestinal: Negative for abdominal pain, bowel incontinence, diarrhea and excessive appetite.  Genitourinary: Negative for decreased libido, genital sores and incomplete emptying.  Neurological: Negative for brief paralysis, focal weakness, headaches and loss of balance.  Psychiatric/Behavioral: Negative for altered mental status, depression and suicidal ideas.  Allergic/Immunologic: Negative for HIV exposure and persistent infections.    EKGs/Labs/Other Studies Reviewed:    The following studies were reviewed today:   EKG:  The ekg ordered today demonstrates   Recent Labs: 02/13/2022: ALT 11 11/25/2022: BUN 17; Creatinine, Ser 0.77; Magnesium 2.1; Potassium 4.9; Sodium 139  Recent Lipid Panel    Component Value Date/Time   CHOL 117 02/13/2022 1236   TRIG 55 02/13/2022 1236   HDL 53 02/13/2022 1236   CHOLHDL 2.2 02/13/2022 1236   CHOLHDL 2.2 07/20/2017 0949   VLDL 10 09/07/2016 1027   LDLCALC 52 02/13/2022 1236   LDLCALC 45 07/20/2017 0949    Physical Exam:    VS:  BP 106/70 (BP Location: Left Arm, Patient Position: Sitting, Cuff Size: Small)   Pulse 81   Ht '5\' 3"'$  (1.6 m)   Wt 48.4 kg   SpO2 98%    BMI 18.90 kg/m     Wt Readings from Last 3 Encounters:  11/25/22 48.4 kg  08/26/22 47.9 kg  01/27/22 48.1 kg     GEN: Well nourished, well developed in no acute distress HEENT: Normal NECK: No JVD; No carotid bruits LYMPHATICS: No lymphadenopathy CARDIAC: S1S2 noted,RRR, no murmurs, rubs, gallops RESPIRATORY:  Clear to auscultation without rales, wheezing or rhonchi  ABDOMEN: Soft, non-tender, non-distended, +bowel sounds, no guarding. EXTREMITIES: No edema, No cyanosis, no clubbing MUSCULOSKELETAL:  No deformity  SKIN: Warm and dry NEUROLOGIC:  Alert and oriented x 3, non-focal PSYCHIATRIC:  Normal affect, good insight  ASSESSMENT:    1. Medication management   2. Left bundle branch block   3. Chronic combined systolic and diastolic congestive heart failure ([place])   4. Coronary artery disease involving native coronary artery of native heart with angina pectoris ([place])    PLAN:     Physical exam showed +1 lower extremity edema.  I have discussed with the patient and his daughter.  He takes his Lasix every third day have asked her to give the patient additional dose tomorrow.  But I think ideally if he wears his compression stocking this will help.  He does have compression stockings at home I have asked her to have him wear this on a daily basis.  Blood work will be done today for BMP and mag.  He will follow-up with Dr. [name] as scheduled.  The patient is in agreement with the above plan. The patient left the office in stable condition.  The patient will follow up in   Medication Adjustments/Labs and Tests Ordered: Current medicines are reviewed at length with the patient today.  Concerns regarding medicines are outlined above.  Orders Placed This Encounter  Procedures   Basic Metabolic Panel (BMET)   Magnesium   No orders of the defined types were placed in this encounter.   Patient Instructions  Medication Instructions:  Your physician recommends that you  continue on your current medications as directed. Please refer to the Current Medication list given to you today.  *  If you need a refill on your cardiac medications before your next appointment, please call your pharmacy*   Lab Work: Your physician recommends that you have labs drawn today. BMET, Mag  If you have labs (blood work) drawn today and your tests are completely normal, you will receive your results only by: [place] (if you have MyChart) OR A paper copy in the mail If you have any lab test that is abnormal or we need to change your treatment, we will call you to review the results.   Testing/Procedures: None   Follow-Up: At [facility], you and your health needs are our priority.  As part of our continuing mission to provide you with exceptional heart care, we have created designated Provider Care Teams.  These Care Teams include your primary Cardiologist (physician) and Advanced Practice Providers (APPs -  Physician Assistants and Nurse Practitioners) who all work together to provide you with the care you need, when you need it.   Your next appointment:   As scheduled   Provider:   [name], MD     Other Instructions -Please get compression socks, wear them daily.  -Take an extra dose of Lasix tomorrow than back to every 3rd day.     Adopting a Healthy Lifestyle.  Know what a healthy weight is for you (roughly BMI <25) and aim to maintain this   Aim for 7+ servings of fruits and vegetables daily   65-80+ fluid ounces of water or unsweet tea for healthy kidneys   Limit to max 1 drink of alcohol per day; avoid smoking/tobacco   Limit animal fats in diet for cholesterol and heart health - choose grass fed whenever available   Avoid highly processed foods, and foods high in saturated/trans fats   Aim for low stress - take time to unwind and care for your mental health   Aim for 150 min of moderate intensity exercise weekly for heart health,  and weights twice weekly for bone health   Aim for 7-9 hours of sleep daily   When it comes to diets, agreement about the perfect plan isnt easy to find, even among the experts. Experts at the [employer] developed an idea known as the Healthy Eating Plate. Just imagine a plate divided into logical, healthy portions.   The emphasis is on diet quality:   Load up on vegetables and fruits - one-half of your plate: Aim for color and variety, and remember that potatoes dont count.   Go for whole grains - one-quarter of your plate: Whole wheat, barley, wheat berries, quinoa, oats, brown rice, and foods made with them. If you want pasta, go with whole wheat pasta.   Protein power - one-quarter of your plate: Fish, chicken, beans, and nuts are all healthy, versatile protein sources. Limit red meat.   The diet, however, does go beyond the plate, offering a few other suggestions.   Use healthy plant oils, such as olive, canola, soy, corn, sunflower and peanut. Check the labels, and avoid partially hydrogenated oil, which have unhealthy trans fats.   If youre thirsty, drink water. Coffee and tea are good in moderation, but skip sugary drinks and limit milk and dairy products to one or two daily servings.   The type of carbohydrate in the diet is more important than the amount. Some sources of carbohydrates, such as vegetables, fruits, whole grains, and beans-are healthier than others.   Finally, stay active  Signed, Juliany Daughety, DO  11/29/2022 7:57 PM    [facility]

## 2022-11-25 NOTE — Patient Instructions (Signed)
Medication Instructions:  Your physician recommends that you continue on your current medications as directed. Please refer to the Current Medication list given to you today.  *If you need a refill on your cardiac medications before your next appointment, please call your pharmacy*   Lab Work: Your physician recommends that you have labs drawn today. BMET, Mag  If you have labs (blood work) drawn today and your tests are completely normal, you will receive your results only by: [place] (if you have MyChart) OR A paper copy in the mail If you have any lab test that is abnormal or we need to change your treatment, we will call you to review the results.   Testing/Procedures: None   Follow-Up: At [facility], you and your health needs are our priority.  As part of our continuing mission to provide you with exceptional heart care, we have created designated Provider Care Teams.  These Care Teams include your primary Cardiologist (physician) and Advanced Practice Providers (APPs -  Physician Assistants and Nurse Practitioners) who all work together to provide you with the care you need, when you need it.   Your next appointment:   As scheduled   Provider:   [name], MD     Other Instructions -Please get compression socks, wear them daily.  -Take an extra dose of Lasix tomorrow than back to every 3rd day.

## 2022-11-26 DIAGNOSIS — E1122 Type 2 diabetes mellitus with diabetic chronic kidney disease: Secondary | ICD-10-CM | POA: Diagnosis not present

## 2022-11-26 DIAGNOSIS — M25561 Pain in right knee: Secondary | ICD-10-CM | POA: Diagnosis not present

## 2022-11-26 DIAGNOSIS — N189 Chronic kidney disease, unspecified: Secondary | ICD-10-CM | POA: Diagnosis not present

## 2022-11-26 DIAGNOSIS — M25562 Pain in left knee: Secondary | ICD-10-CM | POA: Diagnosis not present

## 2022-11-26 DIAGNOSIS — I5032 Chronic diastolic (congestive) heart failure: Secondary | ICD-10-CM | POA: Diagnosis not present

## 2022-11-26 DIAGNOSIS — I130 Hypertensive heart and chronic kidney disease with heart failure and stage 1 through stage 4 chronic kidney disease, or unspecified chronic kidney disease: Secondary | ICD-10-CM | POA: Diagnosis not present

## 2022-11-26 DIAGNOSIS — E1151 Type 2 diabetes mellitus with diabetic peripheral angiopathy without gangrene: Secondary | ICD-10-CM | POA: Diagnosis not present

## 2022-11-26 DIAGNOSIS — E1142 Type 2 diabetes mellitus with diabetic polyneuropathy: Secondary | ICD-10-CM | POA: Diagnosis not present

## 2022-11-26 DIAGNOSIS — I5020 Unspecified systolic (congestive) heart failure: Secondary | ICD-10-CM | POA: Diagnosis not present

## 2022-11-26 LAB — BASIC METABOLIC PANEL
BUN/Creatinine Ratio: 22 (ref 10–24)
BUN: 17 mg/dL (ref 10–36)
CO2: 24 mmol/L (ref 20–29)
Calcium: 8.7 mg/dL (ref 8.6–10.2)
Chloride: 100 mmol/L (ref 96–106)
Creatinine, Ser: 0.77 mg/dL (ref 0.76–1.27)
Glucose: 157 mg/dL — ABNORMAL HIGH (ref 70–99)
Potassium: 4.9 mmol/L (ref 3.5–5.2)
Sodium: 139 mmol/L (ref 134–144)
eGFR: 83 mL/min/{1.73_m2} (ref >59)

## 2022-11-26 LAB — MAGNESIUM: Magnesium: 2.1 mg/dL (ref 1.6–2.3)

## 2022-11-27 DIAGNOSIS — E1151 Type 2 diabetes mellitus with diabetic peripheral angiopathy without gangrene: Secondary | ICD-10-CM | POA: Diagnosis not present

## 2022-11-27 DIAGNOSIS — D582 Other hemoglobinopathies: Secondary | ICD-10-CM | POA: Diagnosis not present

## 2022-11-27 DIAGNOSIS — I110 Hypertensive heart disease with heart failure: Secondary | ICD-10-CM | POA: Diagnosis not present

## 2022-11-27 DIAGNOSIS — M1990 Unspecified osteoarthritis, unspecified site: Secondary | ICD-10-CM | POA: Diagnosis not present

## 2022-11-27 DIAGNOSIS — I2510 Atherosclerotic heart disease of native coronary artery without angina pectoris: Secondary | ICD-10-CM | POA: Diagnosis not present

## 2022-11-27 DIAGNOSIS — Z1339 Encounter for screening examination for other mental health and behavioral disorders: Secondary | ICD-10-CM | POA: Diagnosis not present

## 2022-11-27 DIAGNOSIS — I5032 Chronic diastolic (congestive) heart failure: Secondary | ICD-10-CM | POA: Diagnosis not present

## 2022-11-27 DIAGNOSIS — I872 Venous insufficiency (chronic) (peripheral): Secondary | ICD-10-CM | POA: Diagnosis not present

## 2022-11-27 DIAGNOSIS — R531 Weakness: Secondary | ICD-10-CM | POA: Diagnosis not present

## 2022-11-27 DIAGNOSIS — Z1331 Encounter for screening for depression: Secondary | ICD-10-CM | POA: Diagnosis not present

## 2022-11-27 DIAGNOSIS — Z0000 Encounter for general adult medical examination without abnormal findings: Secondary | ICD-10-CM | POA: Diagnosis not present

## 2022-11-27 DIAGNOSIS — E7800 Pure hypercholesterolemia, unspecified: Secondary | ICD-10-CM | POA: Diagnosis not present

## 2022-12-02 DIAGNOSIS — I130 Hypertensive heart and chronic kidney disease with heart failure and stage 1 through stage 4 chronic kidney disease, or unspecified chronic kidney disease: Secondary | ICD-10-CM | POA: Diagnosis not present

## 2022-12-02 DIAGNOSIS — I5020 Unspecified systolic (congestive) heart failure: Secondary | ICD-10-CM | POA: Diagnosis not present

## 2022-12-02 DIAGNOSIS — I5032 Chronic diastolic (congestive) heart failure: Secondary | ICD-10-CM | POA: Diagnosis not present

## 2022-12-02 DIAGNOSIS — N189 Chronic kidney disease, unspecified: Secondary | ICD-10-CM | POA: Diagnosis not present

## 2022-12-02 DIAGNOSIS — E1142 Type 2 diabetes mellitus with diabetic polyneuropathy: Secondary | ICD-10-CM | POA: Diagnosis not present

## 2022-12-02 DIAGNOSIS — M25562 Pain in left knee: Secondary | ICD-10-CM | POA: Diagnosis not present

## 2022-12-02 DIAGNOSIS — E1122 Type 2 diabetes mellitus with diabetic chronic kidney disease: Secondary | ICD-10-CM | POA: Diagnosis not present

## 2022-12-02 DIAGNOSIS — E1151 Type 2 diabetes mellitus with diabetic peripheral angiopathy without gangrene: Secondary | ICD-10-CM | POA: Diagnosis not present

## 2022-12-02 DIAGNOSIS — M25561 Pain in right knee: Secondary | ICD-10-CM | POA: Diagnosis not present

## 2022-12-04 DIAGNOSIS — E1122 Type 2 diabetes mellitus with diabetic chronic kidney disease: Secondary | ICD-10-CM | POA: Diagnosis not present

## 2022-12-04 DIAGNOSIS — N189 Chronic kidney disease, unspecified: Secondary | ICD-10-CM | POA: Diagnosis not present

## 2022-12-04 DIAGNOSIS — E1142 Type 2 diabetes mellitus with diabetic polyneuropathy: Secondary | ICD-10-CM | POA: Diagnosis not present

## 2022-12-04 DIAGNOSIS — I5032 Chronic diastolic (congestive) heart failure: Secondary | ICD-10-CM | POA: Diagnosis not present

## 2022-12-04 DIAGNOSIS — M25562 Pain in left knee: Secondary | ICD-10-CM | POA: Diagnosis not present

## 2022-12-04 DIAGNOSIS — M25561 Pain in right knee: Secondary | ICD-10-CM | POA: Diagnosis not present

## 2022-12-04 DIAGNOSIS — I5020 Unspecified systolic (congestive) heart failure: Secondary | ICD-10-CM | POA: Diagnosis not present

## 2022-12-04 DIAGNOSIS — E1151 Type 2 diabetes mellitus with diabetic peripheral angiopathy without gangrene: Secondary | ICD-10-CM | POA: Diagnosis not present

## 2022-12-04 DIAGNOSIS — I130 Hypertensive heart and chronic kidney disease with heart failure and stage 1 through stage 4 chronic kidney disease, or unspecified chronic kidney disease: Secondary | ICD-10-CM | POA: Diagnosis not present

## 2022-12-07 DIAGNOSIS — E1142 Type 2 diabetes mellitus with diabetic polyneuropathy: Secondary | ICD-10-CM | POA: Diagnosis not present

## 2022-12-07 DIAGNOSIS — I130 Hypertensive heart and chronic kidney disease with heart failure and stage 1 through stage 4 chronic kidney disease, or unspecified chronic kidney disease: Secondary | ICD-10-CM | POA: Diagnosis not present

## 2022-12-07 DIAGNOSIS — M25562 Pain in left knee: Secondary | ICD-10-CM | POA: Diagnosis not present

## 2022-12-07 DIAGNOSIS — I5032 Chronic diastolic (congestive) heart failure: Secondary | ICD-10-CM | POA: Diagnosis not present

## 2022-12-07 DIAGNOSIS — E1122 Type 2 diabetes mellitus with diabetic chronic kidney disease: Secondary | ICD-10-CM | POA: Diagnosis not present

## 2022-12-07 DIAGNOSIS — E1151 Type 2 diabetes mellitus with diabetic peripheral angiopathy without gangrene: Secondary | ICD-10-CM | POA: Diagnosis not present

## 2022-12-07 DIAGNOSIS — M25561 Pain in right knee: Secondary | ICD-10-CM | POA: Diagnosis not present

## 2022-12-07 DIAGNOSIS — N189 Chronic kidney disease, unspecified: Secondary | ICD-10-CM | POA: Diagnosis not present

## 2022-12-07 DIAGNOSIS — I5020 Unspecified systolic (congestive) heart failure: Secondary | ICD-10-CM | POA: Diagnosis not present

## 2022-12-11 DIAGNOSIS — I5020 Unspecified systolic (congestive) heart failure: Secondary | ICD-10-CM | POA: Diagnosis not present

## 2022-12-11 DIAGNOSIS — I130 Hypertensive heart and chronic kidney disease with heart failure and stage 1 through stage 4 chronic kidney disease, or unspecified chronic kidney disease: Secondary | ICD-10-CM | POA: Diagnosis not present

## 2022-12-11 DIAGNOSIS — M25562 Pain in left knee: Secondary | ICD-10-CM | POA: Diagnosis not present

## 2022-12-11 DIAGNOSIS — E1151 Type 2 diabetes mellitus with diabetic peripheral angiopathy without gangrene: Secondary | ICD-10-CM | POA: Diagnosis not present

## 2022-12-11 DIAGNOSIS — I5032 Chronic diastolic (congestive) heart failure: Secondary | ICD-10-CM | POA: Diagnosis not present

## 2022-12-11 DIAGNOSIS — M25561 Pain in right knee: Secondary | ICD-10-CM | POA: Diagnosis not present

## 2022-12-11 DIAGNOSIS — N189 Chronic kidney disease, unspecified: Secondary | ICD-10-CM | POA: Diagnosis not present

## 2022-12-11 DIAGNOSIS — E1122 Type 2 diabetes mellitus with diabetic chronic kidney disease: Secondary | ICD-10-CM | POA: Diagnosis not present

## 2022-12-11 DIAGNOSIS — E1142 Type 2 diabetes mellitus with diabetic polyneuropathy: Secondary | ICD-10-CM | POA: Diagnosis not present

## 2022-12-15 DIAGNOSIS — I5032 Chronic diastolic (congestive) heart failure: Secondary | ICD-10-CM | POA: Diagnosis not present

## 2022-12-15 DIAGNOSIS — I130 Hypertensive heart and chronic kidney disease with heart failure and stage 1 through stage 4 chronic kidney disease, or unspecified chronic kidney disease: Secondary | ICD-10-CM | POA: Diagnosis not present

## 2022-12-15 DIAGNOSIS — M25561 Pain in right knee: Secondary | ICD-10-CM | POA: Diagnosis not present

## 2022-12-15 DIAGNOSIS — E1122 Type 2 diabetes mellitus with diabetic chronic kidney disease: Secondary | ICD-10-CM | POA: Diagnosis not present

## 2022-12-15 DIAGNOSIS — E1142 Type 2 diabetes mellitus with diabetic polyneuropathy: Secondary | ICD-10-CM | POA: Diagnosis not present

## 2022-12-15 DIAGNOSIS — I5020 Unspecified systolic (congestive) heart failure: Secondary | ICD-10-CM | POA: Diagnosis not present

## 2022-12-15 DIAGNOSIS — M25562 Pain in left knee: Secondary | ICD-10-CM | POA: Diagnosis not present

## 2022-12-15 DIAGNOSIS — N189 Chronic kidney disease, unspecified: Secondary | ICD-10-CM | POA: Diagnosis not present

## 2022-12-15 DIAGNOSIS — E1151 Type 2 diabetes mellitus with diabetic peripheral angiopathy without gangrene: Secondary | ICD-10-CM | POA: Diagnosis not present

## 2022-12-17 DIAGNOSIS — E1151 Type 2 diabetes mellitus with diabetic peripheral angiopathy without gangrene: Secondary | ICD-10-CM | POA: Diagnosis not present

## 2022-12-17 DIAGNOSIS — I5032 Chronic diastolic (congestive) heart failure: Secondary | ICD-10-CM | POA: Diagnosis not present

## 2022-12-17 DIAGNOSIS — I130 Hypertensive heart and chronic kidney disease with heart failure and stage 1 through stage 4 chronic kidney disease, or unspecified chronic kidney disease: Secondary | ICD-10-CM | POA: Diagnosis not present

## 2022-12-17 DIAGNOSIS — E1122 Type 2 diabetes mellitus with diabetic chronic kidney disease: Secondary | ICD-10-CM | POA: Diagnosis not present

## 2022-12-17 DIAGNOSIS — I5020 Unspecified systolic (congestive) heart failure: Secondary | ICD-10-CM | POA: Diagnosis not present

## 2022-12-17 DIAGNOSIS — M25562 Pain in left knee: Secondary | ICD-10-CM | POA: Diagnosis not present

## 2022-12-17 DIAGNOSIS — M25561 Pain in right knee: Secondary | ICD-10-CM | POA: Diagnosis not present

## 2022-12-17 DIAGNOSIS — N189 Chronic kidney disease, unspecified: Secondary | ICD-10-CM | POA: Diagnosis not present

## 2022-12-17 DIAGNOSIS — E1142 Type 2 diabetes mellitus with diabetic polyneuropathy: Secondary | ICD-10-CM | POA: Diagnosis not present

## 2022-12-26 DIAGNOSIS — I130 Hypertensive heart and chronic kidney disease with heart failure and stage 1 through stage 4 chronic kidney disease, or unspecified chronic kidney disease: Secondary | ICD-10-CM | POA: Diagnosis not present

## 2022-12-26 DIAGNOSIS — I5020 Unspecified systolic (congestive) heart failure: Secondary | ICD-10-CM | POA: Diagnosis not present

## 2022-12-26 DIAGNOSIS — N189 Chronic kidney disease, unspecified: Secondary | ICD-10-CM | POA: Diagnosis not present

## 2022-12-26 DIAGNOSIS — E1142 Type 2 diabetes mellitus with diabetic polyneuropathy: Secondary | ICD-10-CM | POA: Diagnosis not present

## 2022-12-26 DIAGNOSIS — E1122 Type 2 diabetes mellitus with diabetic chronic kidney disease: Secondary | ICD-10-CM | POA: Diagnosis not present

## 2022-12-26 DIAGNOSIS — I5032 Chronic diastolic (congestive) heart failure: Secondary | ICD-10-CM | POA: Diagnosis not present

## 2022-12-26 DIAGNOSIS — E1151 Type 2 diabetes mellitus with diabetic peripheral angiopathy without gangrene: Secondary | ICD-10-CM | POA: Diagnosis not present

## 2022-12-26 DIAGNOSIS — M25561 Pain in right knee: Secondary | ICD-10-CM | POA: Diagnosis not present

## 2022-12-26 DIAGNOSIS — M25562 Pain in left knee: Secondary | ICD-10-CM | POA: Diagnosis not present

## 2022-12-28 DIAGNOSIS — M25561 Pain in right knee: Secondary | ICD-10-CM | POA: Diagnosis not present

## 2022-12-28 DIAGNOSIS — E1122 Type 2 diabetes mellitus with diabetic chronic kidney disease: Secondary | ICD-10-CM | POA: Diagnosis not present

## 2022-12-28 DIAGNOSIS — I5032 Chronic diastolic (congestive) heart failure: Secondary | ICD-10-CM | POA: Diagnosis not present

## 2022-12-28 DIAGNOSIS — I5020 Unspecified systolic (congestive) heart failure: Secondary | ICD-10-CM | POA: Diagnosis not present

## 2022-12-28 DIAGNOSIS — M25562 Pain in left knee: Secondary | ICD-10-CM | POA: Diagnosis not present

## 2022-12-28 DIAGNOSIS — E1142 Type 2 diabetes mellitus with diabetic polyneuropathy: Secondary | ICD-10-CM | POA: Diagnosis not present

## 2022-12-28 DIAGNOSIS — I130 Hypertensive heart and chronic kidney disease with heart failure and stage 1 through stage 4 chronic kidney disease, or unspecified chronic kidney disease: Secondary | ICD-10-CM | POA: Diagnosis not present

## 2022-12-28 DIAGNOSIS — N189 Chronic kidney disease, unspecified: Secondary | ICD-10-CM | POA: Diagnosis not present

## 2022-12-28 DIAGNOSIS — E1151 Type 2 diabetes mellitus with diabetic peripheral angiopathy without gangrene: Secondary | ICD-10-CM | POA: Diagnosis not present

## 2022-12-29 DIAGNOSIS — E1142 Type 2 diabetes mellitus with diabetic polyneuropathy: Secondary | ICD-10-CM | POA: Diagnosis not present

## 2022-12-29 DIAGNOSIS — M25561 Pain in right knee: Secondary | ICD-10-CM | POA: Diagnosis not present

## 2022-12-29 DIAGNOSIS — M25562 Pain in left knee: Secondary | ICD-10-CM | POA: Diagnosis not present

## 2022-12-29 DIAGNOSIS — E1122 Type 2 diabetes mellitus with diabetic chronic kidney disease: Secondary | ICD-10-CM | POA: Diagnosis not present

## 2022-12-29 DIAGNOSIS — I5032 Chronic diastolic (congestive) heart failure: Secondary | ICD-10-CM | POA: Diagnosis not present

## 2022-12-29 DIAGNOSIS — E1151 Type 2 diabetes mellitus with diabetic peripheral angiopathy without gangrene: Secondary | ICD-10-CM | POA: Diagnosis not present

## 2022-12-29 DIAGNOSIS — I130 Hypertensive heart and chronic kidney disease with heart failure and stage 1 through stage 4 chronic kidney disease, or unspecified chronic kidney disease: Secondary | ICD-10-CM | POA: Diagnosis not present

## 2022-12-29 DIAGNOSIS — N189 Chronic kidney disease, unspecified: Secondary | ICD-10-CM | POA: Diagnosis not present

## 2022-12-29 DIAGNOSIS — I5020 Unspecified systolic (congestive) heart failure: Secondary | ICD-10-CM | POA: Diagnosis not present

## 2022-12-31 DIAGNOSIS — E1142 Type 2 diabetes mellitus with diabetic polyneuropathy: Secondary | ICD-10-CM | POA: Diagnosis not present

## 2022-12-31 DIAGNOSIS — I130 Hypertensive heart and chronic kidney disease with heart failure and stage 1 through stage 4 chronic kidney disease, or unspecified chronic kidney disease: Secondary | ICD-10-CM | POA: Diagnosis not present

## 2022-12-31 DIAGNOSIS — N189 Chronic kidney disease, unspecified: Secondary | ICD-10-CM | POA: Diagnosis not present

## 2022-12-31 DIAGNOSIS — I5032 Chronic diastolic (congestive) heart failure: Secondary | ICD-10-CM | POA: Diagnosis not present

## 2022-12-31 DIAGNOSIS — I5020 Unspecified systolic (congestive) heart failure: Secondary | ICD-10-CM | POA: Diagnosis not present

## 2022-12-31 DIAGNOSIS — M25561 Pain in right knee: Secondary | ICD-10-CM | POA: Diagnosis not present

## 2022-12-31 DIAGNOSIS — E1151 Type 2 diabetes mellitus with diabetic peripheral angiopathy without gangrene: Secondary | ICD-10-CM | POA: Diagnosis not present

## 2022-12-31 DIAGNOSIS — M25562 Pain in left knee: Secondary | ICD-10-CM | POA: Diagnosis not present

## 2022-12-31 DIAGNOSIS — E1122 Type 2 diabetes mellitus with diabetic chronic kidney disease: Secondary | ICD-10-CM | POA: Diagnosis not present

## 2023-01-01 DIAGNOSIS — M25561 Pain in right knee: Secondary | ICD-10-CM | POA: Diagnosis not present

## 2023-01-01 DIAGNOSIS — I130 Hypertensive heart and chronic kidney disease with heart failure and stage 1 through stage 4 chronic kidney disease, or unspecified chronic kidney disease: Secondary | ICD-10-CM | POA: Diagnosis not present

## 2023-01-01 DIAGNOSIS — E1151 Type 2 diabetes mellitus with diabetic peripheral angiopathy without gangrene: Secondary | ICD-10-CM | POA: Diagnosis not present

## 2023-01-01 DIAGNOSIS — N189 Chronic kidney disease, unspecified: Secondary | ICD-10-CM | POA: Diagnosis not present

## 2023-01-01 DIAGNOSIS — E1142 Type 2 diabetes mellitus with diabetic polyneuropathy: Secondary | ICD-10-CM | POA: Diagnosis not present

## 2023-01-01 DIAGNOSIS — E1122 Type 2 diabetes mellitus with diabetic chronic kidney disease: Secondary | ICD-10-CM | POA: Diagnosis not present

## 2023-01-01 DIAGNOSIS — I5020 Unspecified systolic (congestive) heart failure: Secondary | ICD-10-CM | POA: Diagnosis not present

## 2023-01-01 DIAGNOSIS — I5032 Chronic diastolic (congestive) heart failure: Secondary | ICD-10-CM | POA: Diagnosis not present

## 2023-01-01 DIAGNOSIS — M25562 Pain in left knee: Secondary | ICD-10-CM | POA: Diagnosis not present

## 2023-01-07 DIAGNOSIS — N189 Chronic kidney disease, unspecified: Secondary | ICD-10-CM | POA: Diagnosis not present

## 2023-01-07 DIAGNOSIS — E1142 Type 2 diabetes mellitus with diabetic polyneuropathy: Secondary | ICD-10-CM | POA: Diagnosis not present

## 2023-01-07 DIAGNOSIS — E1122 Type 2 diabetes mellitus with diabetic chronic kidney disease: Secondary | ICD-10-CM | POA: Diagnosis not present

## 2023-01-07 DIAGNOSIS — I130 Hypertensive heart and chronic kidney disease with heart failure and stage 1 through stage 4 chronic kidney disease, or unspecified chronic kidney disease: Secondary | ICD-10-CM | POA: Diagnosis not present

## 2023-01-07 DIAGNOSIS — I5032 Chronic diastolic (congestive) heart failure: Secondary | ICD-10-CM | POA: Diagnosis not present

## 2023-01-07 DIAGNOSIS — I5020 Unspecified systolic (congestive) heart failure: Secondary | ICD-10-CM | POA: Diagnosis not present

## 2023-01-07 DIAGNOSIS — M25562 Pain in left knee: Secondary | ICD-10-CM | POA: Diagnosis not present

## 2023-01-07 DIAGNOSIS — E1151 Type 2 diabetes mellitus with diabetic peripheral angiopathy without gangrene: Secondary | ICD-10-CM | POA: Diagnosis not present

## 2023-01-07 DIAGNOSIS — M25561 Pain in right knee: Secondary | ICD-10-CM | POA: Diagnosis not present

## 2023-01-09 DIAGNOSIS — N189 Chronic kidney disease, unspecified: Secondary | ICD-10-CM | POA: Diagnosis not present

## 2023-01-09 DIAGNOSIS — E1122 Type 2 diabetes mellitus with diabetic chronic kidney disease: Secondary | ICD-10-CM | POA: Diagnosis not present

## 2023-01-09 DIAGNOSIS — M25562 Pain in left knee: Secondary | ICD-10-CM | POA: Diagnosis not present

## 2023-01-09 DIAGNOSIS — I5032 Chronic diastolic (congestive) heart failure: Secondary | ICD-10-CM | POA: Diagnosis not present

## 2023-01-09 DIAGNOSIS — M25561 Pain in right knee: Secondary | ICD-10-CM | POA: Diagnosis not present

## 2023-01-09 DIAGNOSIS — E1142 Type 2 diabetes mellitus with diabetic polyneuropathy: Secondary | ICD-10-CM | POA: Diagnosis not present

## 2023-01-09 DIAGNOSIS — I130 Hypertensive heart and chronic kidney disease with heart failure and stage 1 through stage 4 chronic kidney disease, or unspecified chronic kidney disease: Secondary | ICD-10-CM | POA: Diagnosis not present

## 2023-01-09 DIAGNOSIS — I5020 Unspecified systolic (congestive) heart failure: Secondary | ICD-10-CM | POA: Diagnosis not present

## 2023-01-09 DIAGNOSIS — E1151 Type 2 diabetes mellitus with diabetic peripheral angiopathy without gangrene: Secondary | ICD-10-CM | POA: Diagnosis not present

## 2023-01-11 DIAGNOSIS — I5020 Unspecified systolic (congestive) heart failure: Secondary | ICD-10-CM | POA: Diagnosis not present

## 2023-01-11 DIAGNOSIS — E1142 Type 2 diabetes mellitus with diabetic polyneuropathy: Secondary | ICD-10-CM | POA: Diagnosis not present

## 2023-01-11 DIAGNOSIS — E1151 Type 2 diabetes mellitus with diabetic peripheral angiopathy without gangrene: Secondary | ICD-10-CM | POA: Diagnosis not present

## 2023-01-11 DIAGNOSIS — I130 Hypertensive heart and chronic kidney disease with heart failure and stage 1 through stage 4 chronic kidney disease, or unspecified chronic kidney disease: Secondary | ICD-10-CM | POA: Diagnosis not present

## 2023-01-11 DIAGNOSIS — E1122 Type 2 diabetes mellitus with diabetic chronic kidney disease: Secondary | ICD-10-CM | POA: Diagnosis not present

## 2023-01-11 DIAGNOSIS — M25562 Pain in left knee: Secondary | ICD-10-CM | POA: Diagnosis not present

## 2023-01-11 DIAGNOSIS — I5032 Chronic diastolic (congestive) heart failure: Secondary | ICD-10-CM | POA: Diagnosis not present

## 2023-01-11 DIAGNOSIS — N189 Chronic kidney disease, unspecified: Secondary | ICD-10-CM | POA: Diagnosis not present

## 2023-01-11 DIAGNOSIS — M25561 Pain in right knee: Secondary | ICD-10-CM | POA: Diagnosis not present

## 2023-01-13 DIAGNOSIS — M25561 Pain in right knee: Secondary | ICD-10-CM | POA: Diagnosis not present

## 2023-01-13 DIAGNOSIS — I130 Hypertensive heart and chronic kidney disease with heart failure and stage 1 through stage 4 chronic kidney disease, or unspecified chronic kidney disease: Secondary | ICD-10-CM | POA: Diagnosis not present

## 2023-01-13 DIAGNOSIS — N189 Chronic kidney disease, unspecified: Secondary | ICD-10-CM | POA: Diagnosis not present

## 2023-01-13 DIAGNOSIS — I5020 Unspecified systolic (congestive) heart failure: Secondary | ICD-10-CM | POA: Diagnosis not present

## 2023-01-13 DIAGNOSIS — E1122 Type 2 diabetes mellitus with diabetic chronic kidney disease: Secondary | ICD-10-CM | POA: Diagnosis not present

## 2023-01-13 DIAGNOSIS — E1151 Type 2 diabetes mellitus with diabetic peripheral angiopathy without gangrene: Secondary | ICD-10-CM | POA: Diagnosis not present

## 2023-01-13 DIAGNOSIS — E1142 Type 2 diabetes mellitus with diabetic polyneuropathy: Secondary | ICD-10-CM | POA: Diagnosis not present

## 2023-01-13 DIAGNOSIS — I5032 Chronic diastolic (congestive) heart failure: Secondary | ICD-10-CM | POA: Diagnosis not present

## 2023-01-13 DIAGNOSIS — M25562 Pain in left knee: Secondary | ICD-10-CM | POA: Diagnosis not present

## 2023-01-14 DIAGNOSIS — I130 Hypertensive heart and chronic kidney disease with heart failure and stage 1 through stage 4 chronic kidney disease, or unspecified chronic kidney disease: Secondary | ICD-10-CM | POA: Diagnosis not present

## 2023-01-14 DIAGNOSIS — I5020 Unspecified systolic (congestive) heart failure: Secondary | ICD-10-CM | POA: Diagnosis not present

## 2023-01-14 DIAGNOSIS — E1122 Type 2 diabetes mellitus with diabetic chronic kidney disease: Secondary | ICD-10-CM | POA: Diagnosis not present

## 2023-01-14 DIAGNOSIS — I5032 Chronic diastolic (congestive) heart failure: Secondary | ICD-10-CM | POA: Diagnosis not present

## 2023-01-14 DIAGNOSIS — M25562 Pain in left knee: Secondary | ICD-10-CM | POA: Diagnosis not present

## 2023-01-14 DIAGNOSIS — E1142 Type 2 diabetes mellitus with diabetic polyneuropathy: Secondary | ICD-10-CM | POA: Diagnosis not present

## 2023-01-14 DIAGNOSIS — N189 Chronic kidney disease, unspecified: Secondary | ICD-10-CM | POA: Diagnosis not present

## 2023-01-14 DIAGNOSIS — M25561 Pain in right knee: Secondary | ICD-10-CM | POA: Diagnosis not present

## 2023-01-14 DIAGNOSIS — E1151 Type 2 diabetes mellitus with diabetic peripheral angiopathy without gangrene: Secondary | ICD-10-CM | POA: Diagnosis not present

## 2023-01-20 DIAGNOSIS — E1142 Type 2 diabetes mellitus with diabetic polyneuropathy: Secondary | ICD-10-CM | POA: Diagnosis not present

## 2023-01-20 DIAGNOSIS — I5032 Chronic diastolic (congestive) heart failure: Secondary | ICD-10-CM | POA: Diagnosis not present

## 2023-01-20 DIAGNOSIS — N189 Chronic kidney disease, unspecified: Secondary | ICD-10-CM | POA: Diagnosis not present

## 2023-01-20 DIAGNOSIS — I130 Hypertensive heart and chronic kidney disease with heart failure and stage 1 through stage 4 chronic kidney disease, or unspecified chronic kidney disease: Secondary | ICD-10-CM | POA: Diagnosis not present

## 2023-01-20 DIAGNOSIS — E1122 Type 2 diabetes mellitus with diabetic chronic kidney disease: Secondary | ICD-10-CM | POA: Diagnosis not present

## 2023-01-20 DIAGNOSIS — E1151 Type 2 diabetes mellitus with diabetic peripheral angiopathy without gangrene: Secondary | ICD-10-CM | POA: Diagnosis not present

## 2023-01-20 DIAGNOSIS — M25561 Pain in right knee: Secondary | ICD-10-CM | POA: Diagnosis not present

## 2023-01-20 DIAGNOSIS — I5020 Unspecified systolic (congestive) heart failure: Secondary | ICD-10-CM | POA: Diagnosis not present

## 2023-01-20 DIAGNOSIS — M25562 Pain in left knee: Secondary | ICD-10-CM | POA: Diagnosis not present

## 2023-01-22 DIAGNOSIS — M25561 Pain in right knee: Secondary | ICD-10-CM | POA: Diagnosis not present

## 2023-01-22 DIAGNOSIS — E1151 Type 2 diabetes mellitus with diabetic peripheral angiopathy without gangrene: Secondary | ICD-10-CM | POA: Diagnosis not present

## 2023-01-22 DIAGNOSIS — N189 Chronic kidney disease, unspecified: Secondary | ICD-10-CM | POA: Diagnosis not present

## 2023-01-22 DIAGNOSIS — E1122 Type 2 diabetes mellitus with diabetic chronic kidney disease: Secondary | ICD-10-CM | POA: Diagnosis not present

## 2023-01-22 DIAGNOSIS — E1142 Type 2 diabetes mellitus with diabetic polyneuropathy: Secondary | ICD-10-CM | POA: Diagnosis not present

## 2023-01-22 DIAGNOSIS — I5020 Unspecified systolic (congestive) heart failure: Secondary | ICD-10-CM | POA: Diagnosis not present

## 2023-01-22 DIAGNOSIS — I5032 Chronic diastolic (congestive) heart failure: Secondary | ICD-10-CM | POA: Diagnosis not present

## 2023-01-22 DIAGNOSIS — M25562 Pain in left knee: Secondary | ICD-10-CM | POA: Diagnosis not present

## 2023-01-22 DIAGNOSIS — I130 Hypertensive heart and chronic kidney disease with heart failure and stage 1 through stage 4 chronic kidney disease, or unspecified chronic kidney disease: Secondary | ICD-10-CM | POA: Diagnosis not present

## 2023-01-23 ENCOUNTER — Other Ambulatory Visit: Payer: Self-pay | Admitting: Cardiovascular Disease

## 2023-03-03 NOTE — Progress Notes (Signed)
Cardiology Clinic Note   Patient Name: Dakota [name] Date of Encounter: 03/05/2023  Primary Care Provider:  [name], MD Primary Cardiologist:  Dakota Guadalajara, MD  Patient Profile    Dakota [name] 89 year old male presents the clinic today for follow-up evaluation of his combined systolic and diastolic heart failure.   Past Medical History    Past Medical History:  Diagnosis Date   Aortic atherosclerosis (HCC)    Arthritis    Benign localized prostatic hyperplasia with lower urinary tract symptoms (LUTS)    Chronic chest pain    takes imdur   Chronic combined systolic (congestive) and diastolic (congestive) heart failure (HCC)    followed by cardiology   Chronic dyspnea    Cognitive impairment    COPD, moderate (HCC)    Edema of both lower extremities    takes lasix and uses teds   First degree heart block    GERD (gastroesophageal reflux disease)    Heart murmur    History of cardiovascular stress test    Lexiscan Myoview 7/16:  EF 51%, inferior, inferolateral ischemia; Intermediate Risk   History of smallpox    HOH (hard of hearing)    even with hearing aids   Hypertension    Ischemic heart disease 2005   Dr Dakota [name] a. s/p CABG;  b.  LHC 05/23/15:  S-RPAVB ok, S-D2 ok, S-OM2 99% (s/p Synergy DES), L-LAD ok, EF normal (complicated by pseudoaneurysm)    LBBB (left bundle branch block)    Right arm weakness    S/P CABG x 4    01-08-2004   S/P drug eluting coronary stent placement    05-21-2015  PCI and DES x1 to SVG--OM graft   Thalassemia minor    Type 2 diabetes, diet controlled (HCC)    Wears glasses    Wears hearing aid in both ears    Past Surgical History:  Procedure Laterality Date   APPENDECTOMY     CARDIAC CATHETERIZATION  1989   in [place]   normal     CARDIAC CATHETERIZATION N/A 05/23/2015   Procedure: Left Heart Cath and Cors/Grafts Angiography;  Surgeon: Dakota Bihari, MD;  Location: [facility];  Service: Cardiovascular;   Laterality: N/A;   CARDIAC CATHETERIZATION N/A 05/23/2015   Procedure: Coronary Stent Intervention;  Surgeon: Dakota Bihari, MD;  Location: [facility];  Service: Cardiovascular;  Laterality: N/A;   CARDIAC CATHETERIZATION  01-04-2004   dr Dakota [name]  @[facility]    severe 3V CAD, normal LVF   CATARACT EXTRACTION W/ INTRAOCULAR LENS  IMPLANT, BILATERAL     CORONARY ARTERY BYPASS GRAFT  01-08-2004  dr Dakota [name] @[facility]    LIMA to LAD,  SVG to D1,  SVG to OM,  SVG to PDA   FOOT SURGERY Right    INGUINAL HERNIA REPAIR Bilateral 1978   MASS EXCISION Left 11/20/2019   Procedure: EXCISION OF SEBACEOUS CYST CHEST;  Surgeon: Dakota Hatch De Blanch, MD;  Location: [facility];  Service: General;  Laterality: Left;    Allergies  Allergies  Allergen Reactions   Aceon [Perindopril Erbumine] Shortness Of Breath and Cough   Sulfa Antibiotics Swelling   Pork-Derived Products     muslim    History of Present Illness    [name] has a PMH ofleft bundle branch block, combined systolic and diastolic heart failure, CAD, essential hypertension, moderate COPD, diabetes, and nonspecific chest pain.   He was  seen by Dr. Tresa [name]  on 07/17/2019.  During that time he felt well and was considering return to work as well or greater in January 2021.  However, due to the COVID-19 pandemic it was recommended that he not return to work at this time.  An echocardiogram on 07/19/2019 to evaluate systolic function showed an LVEF of 50 to 55%, a mildly dilated left atrium, moderately calcified mitral valve, severe aortic valve annular calcification, mitral valve regurgitation, mild tricuspid valve regurgitation, and mildly elevated pulmonary artery systolic pressure.   He underwent CABG in 2005 with a LIMA to LAD, SVG to diagonal, SVG to obtuse marginal and SVG to distal RCA.  He has a history of combined systolic and diastolic heart failure, left bundle branch block and diabetes mellitus.  In July 2016 he developed  symptoms worrisome for unstable angina.  A nuclear stress test was abnormal and demonstrated inferior to inferolateral ischemia.  He underwent cardiac catheterization on 05/21/2015.  His catheterization showed low normal global LV function with an EF of 50 to 55%.  The catheterization also demonstrated coronary artery disease with 95% native distal left main stenosis with proximal occlusion of the LAD after the first 2 septal perforating arteries total occlusion of the circumflex at the ostium and RCA stenosis with 50% proximal, 50% mid and total occlusion of the distal RCA.  His LIMA graft was patent to his mid LAD.  This vein graft supplying the circumflex marginal and 99% distal stenosis.  He also had a patent vein graft supplying the diagonal vessel and a patent vein graft to the distal RCA.  A DES x1 to the obtuse marginal vein graft was placed at that time.   He presented the clinic 08/07/2019 and stated he felt well.  He continued to be active outside walking daily.  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  However, his daughter did not think this was a good idea.  Given his comorbidities and cardiac history with increased risk of COVID-19 he was advised not to return to work.   I will gave him a work note.  He had no other concerns or complaints at the time.   He was seen by Dr. [place] 11/21/2020.  During that time he reported increased dyspnea and lower extremity swelling for several weeks.  He had a cough that produced yellow phlegm.  He had taken extra furosemide, was urinating more however, still had lower extremity swelling.  He was seen by his primary care who ordered a chest x-ray which showed bilateral pleural effusion.  He was started on Levaquin for possible PNA.  Lab work was also drawn at the time but was still pending.  His furosemide was increased to 40 mg twice daily.  He was instructed that if he became worse he may be a candidate for thoracentesis.   He presented  to the clinic 11/29/2020 for follow-up evaluation stated he was feeling much better.  He questioned whether he needed to continue to take 40 mg of Lasix twice daily.  He reported that he noticed that his ankles still had some swelling.  He felt that the fluid was coming off fairly slowly.  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   I ordered a BMP and plan follow-up in 6 months.  He was seen in follow-up by Dr. Servando Salina on 11/25/2022.  His daughter had contacted the office the prior day and reported that he had lower extremity swelling.  He was noted to have  some left ankle edema.  He denied shortness of breath, chest pain lightheadedness, dizziness, and leg pain.  His Lasix continued every third day but he was asked to take an extra dose.  Continued use of the lower extremity support stockings was recommended.  His BMP was stable and his magnesium was WDL.  He presents to the clinic today for follow-up evaluation and states he is now retired.  He presents with his daughter.  He reports that he walks most days of the week.  He has been taking his furosemide every third day.  His blood pressure is low today at 86/52.  He denies chest pain shortness of breath.  He is euvolemic.  I will stop his amlodipine, decrease his furosemide to every fourth day, have him slightly increase his p.o. hydration, order a BMP and plan follow-up when they return to town after 06/24/2023.   Today he denies chest pain, increased shortness of breath, lower extremity edema, fatigue, palpitations, melena, hematuria, hemoptysis, diaphoresis, weakness, presyncope, syncope, orthopnea, and PND.      Home Medications    Prior to Admission medications   Medication Sig Start Date End Date Taking? Authorizing Provider  albuterol (PROVENTIL HFA;VENTOLIN HFA) 108 (90 Base) MCG/ACT inhaler Inhale 2 puffs into the lungs every 4 (four) hours as needed for wheezing or shortness of breath. 01/03/16   Charm Rings, MD  amLODipine (NORVASC) 2.5 MG  tablet Take 1 tablet by mouth once daily 06/08/22   Dakota Bihari, MD  apixaban (ELIQUIS) 2.5 MG TABS tablet Take 1 tablet (2.5 mg total) by mouth 2 (two) times daily. 11/04/22   Dakota Bihari, MD  Blood Glucose Monitoring Suppl (ONE TOUCH ULTRA 2) w/Device KIT daily in the afternoon. 11/02/22   [provider]  chlorhexidine (PERIDEX) 0.12 % solution 15 mLs 2 (two) times daily. 10/28/22   [provider]  docusate sodium (COLACE) 100 MG capsule Take 100 mg by mouth at bedtime.    [provider]  finasteride (PROSCAR) 5 MG tablet Take 5 mg by mouth at bedtime.     [provider]  fluticasone (FLONASE) 50 MCG/ACT nasal spray Place into both nostrils. 01/07/22   [provider]  furosemide (LASIX) 20 MG tablet TAKE 1 TABLET BY MOUTH EVERY OTHER DAY 01/25/23   Dakota Bihari, MD  isosorbide mononitrate (IMDUR) 30 MG 24 hr tablet Take 1 tablet by mouth once daily 06/08/22   Dakota Bihari, MD  losartan (COZAAR) 25 MG tablet Take 1 tablet by mouth once daily 06/08/22   Dakota Bihari, MD  mirabegron ER (MYRBETRIQ) 50 MG TB24 tablet Take 50 mg by mouth at bedtime.     [provider]  Multiple Vitamin (MULTIVITAMIN WITH MINERALS) TABS tablet Take 1 tablet by mouth daily.    [provider]  Multiple Vitamins-Minerals (PRESERVISION AREDS 2 PO) Take 1 tablet by mouth 2 (two) times daily.     [provider]  NITROSTAT 0.4 MG SL tablet DISSOLVE ONE TABLET UNDER THE TONGUE EVERY 5 MINUTES AS NEEDED FOR CHEST PAIN. Patient not taking: Reported on 11/25/2022 01/13/22   Dakota Bihari, MD  ONE TOUCH ULTRA TEST test strip 1 each by Other route as needed (glucose monoring).  04/21/15   [provider]  pantoprazole (PROTONIX) 40 MG tablet Take 1 tablet (40 mg total) by mouth daily. Patient taking differently: Take 40 mg by mouth daily. 05/21/15   Tereso Newcomer T, PA-C  PFIZER COVID-19 [facility]  BIVALENT injection  10/22/21   [provider]  pravastatin (PRAVACHOL) 80 MG tablet Take 80 mg by mouth at bedtime.    [provider]  predniSONE (DELTASONE) 10 MG tablet Take 10 mg by mouth. 09/21/22   [provider]    Family History    Family History  Problem Relation Age of Onset   Healthy Father    Ulcers Mother    Stroke Mother    He indicated that his mother is deceased. He indicated that his father is deceased. He indicated that his sister is deceased. He indicated that his maternal grandmother is deceased. He indicated that his maternal grandfather is deceased. He indicated that his paternal grandmother is deceased. He indicated that his paternal grandfather is deceased.  Social History    Social History   Socioeconomic History   Marital status: Married    Spouse name: Not on file   Number of children: 2 d   Years of education: Not on file   Highest education level: Not on file  Occupational History   Occupation: accountant    Comment: [employer]  Tobacco Use   Smoking status: Never   Smokeless tobacco: Never  Vaping Use   Vaping Use: Never used  Substance and Sexual Activity   Alcohol use: No    Alcohol/week: 0.0 standard drinks of alcohol   Drug use: No   Sexual activity: Not on file  Other Topics Concern   Not on file  Social History Narrative   Not on file   Social Determinants of Health   Financial Resource Strain: Not on file  Food Insecurity: Not on file  Transportation Needs: Not on file  Physical Activity: Not on file  Stress: Not on file  Social Connections: Not on file  Intimate Partner Violence: Not on file     Review of Systems    General:  No chills, fever, night sweats or weight changes.  Cardiovascular:  No chest pain, dyspnea on exertion, edema, orthopnea, palpitations, paroxysmal nocturnal dyspnea. Dermatological: No rash, lesions/masses Respiratory: No cough, dyspnea Urologic: No hematuria, dysuria Abdominal:   No nausea, vomiting, diarrhea,  bright red blood per rectum, melena, or hematemesis Neurologic:  No visual changes, wkns, changes in mental status. All other systems reviewed and are otherwise negative except as noted above.  Physical Exam    VS:  BP (!) 86/52   Pulse 92   Ht 5\' 2"  (1.575 m)   Wt 108 lb 3.2 oz (49.1 kg)   SpO2 99%   BMI 19.79 kg/m  , BMI Body mass index is 19.79 kg/m. GEN: Well nourished, well developed, in no acute distress. HEENT: normal. Neck: Supple, no JVD, carotid bruits, or masses. Cardiac: Irregularly irregular, no murmurs, rubs, or gallops. No clubbing, cyanosis, edema.  Radials/DP/PT 2+ and equal bilaterally.  Respiratory:  Respirations regular and unlabored, clear to auscultation bilaterally. GI: Soft, nontender, nondistended, BS + x 4. MS: no deformity or atrophy. Skin: warm and dry, no rash. Neuro:  Strength and sensation are intact. Psych: Normal affect.  Accessory Clinical Findings    Recent Labs: 11/25/2022: BUN 17; Creatinine, Ser 0.77; Magnesium 2.1; Potassium 4.9; Sodium 139   Recent Lipid Panel    Component Value Date/Time   CHOL 117 02/13/2022 1236   TRIG 55 02/13/2022 1236   HDL 53 02/13/2022 1236   CHOLHDL 2.2 02/13/2022 1236   CHOLHDL 2.2 07/20/2017 0949   VLDL 10 09/07/2016 1027   LDLCALC 52 02/13/2022 1236  LDLCALC 45 07/20/2017 0949         ECG personally reviewed by me today-none today.  Echocardiogram 04/14/2021  IMPRESSIONS     1. Left ventricular ejection fraction, by estimation, is 35 to 40%. The  left ventricle has moderately decreased function. The left ventricle  demonstrates global hypokinesis. Left ventricular diastolic function could  not be evaluated.   2. Right ventricular systolic function is moderately reduced. The right  ventricular size is mildly enlarged. There is mildly elevated pulmonary  artery systolic pressure.   3. Left atrial size was severely dilated.   4. Right atrial size was moderately dilated.   5. The mitral valve  is abnormal. Mild mitral valve regurgitation. Severe  mitral annular calcification.   6. Tricuspid valve regurgitation is moderate.   7. The aortic valve is calcified. There is severe calcifcation of the  aortic valve. Aortic valve regurgitation is not visualized. Mild to  moderate aortic valve sclerosis/calcification is present, without any  evidence of aortic stenosis.   8. The inferior vena cava is normal in size with <50% respiratory  variability, suggesting right atrial pressure of 8 mmHg.   Comparison(s): Prior images reviewed side by side. Changes from prior  study are noted. EF reduced compared to prior study.   Conclusion(s)/Recommendation(s): EF reduced on current study compared to  prior, with significant dyssynchrony. Both mitral and aortic valves are  heavily calcified with restricted mobility. However, neither has  significantly elevated gradients.   FINDINGS   Left Ventricle: Left ventricular ejection fraction, by estimation, is 35  to 40%. The left ventricle has moderately decreased function. The left  ventricle demonstrates global hypokinesis. The left ventricular internal  cavity size was normal in size.  There is no left ventricular hypertrophy. Abnormal (paradoxical) septal  motion, consistent with left bundle branch block. Left ventricular  diastolic function could not be evaluated due to atrial fibrillation. Left  ventricular diastolic function could not   be evaluated.   Right Ventricle: The right ventricular size is mildly enlarged. Right  vetricular wall thickness was not well visualized. Right ventricular  systolic function is moderately reduced. There is mildly elevated  pulmonary artery systolic pressure. The tricuspid   regurgitant velocity is 3.00 m/s, and with an assumed right atrial  pressure of 8 mmHg, the estimated right ventricular systolic pressure is  44.0 mmHg.   Left Atrium: Left atrial size was severely dilated.   Right Atrium: Right  atrial size was moderately dilated.   Pericardium: There is no evidence of pericardial effusion.   Mitral Valve: The mitral valve is abnormal. There is moderate thickening  of the mitral valve leaflet(s). There is moderate calcification of the  mitral valve leaflet(s). Severe mitral annular calcification. Mild mitral  valve regurgitation. MV peak  gradient, 4.2 mmHg. The mean mitral valve gradient is 1.5 mmHg with  average heart rate of 78 bpm.   Tricuspid Valve: The tricuspid valve is normal in structure. Tricuspid  valve regurgitation is moderate . No evidence of tricuspid stenosis.   Aortic Valve: The aortic valve is calcified. There is severe calcifcation  of the aortic valve. There is severe aortic valve annular calcification.  Aortic valve regurgitation is not visualized. Mild to moderate aortic  valve sclerosis/calcification is  present, without any evidence of aortic stenosis. Aortic valve mean  gradient measures 3.5 mmHg. Aortic valve peak gradient measures 7.3 mmHg.  Aortic valve area, by VTI measures 1.36 cm.   Pulmonic Valve: The pulmonic valve was not well  visualized. Pulmonic valve  regurgitation is mild. No evidence of pulmonic stenosis.   Aorta: The aortic root, ascending aorta and aortic arch are all  structurally normal, with no evidence of dilitation or obstruction.   Venous: The inferior vena cava is normal in size with less than 50%  respiratory variability, suggesting right atrial pressure of 8 mmHg.   IAS/Shunts: The atrial septum is grossly normal.    Assessment & Plan   1.  Acute on chronic combined diastolic and systolic CHF-euvolemic. NYHA class II-3.  Reports compliance with his low-sodium diet and furosemide.  Today he denies presyncope and syncope. Reduce furosemide to every fourth day Continue losartan, Imdur Continue low-sodium diet Maintain physical activity.   Essential hypertension-blood pressure today 86/52 Continue Imdur, losartan .   Amlodipine Maintain blood pressure log    Atrial fibrillation-heart rate today 92 BPM.  Reports compliance with apixaban.  Denies bleeding issues. Continue furosemide, Eliquis Avoid triggers caffeine, chocolate, EtOH, dehydration etc.   Hyperlipidemia-LDL 52 on 02/13/2022.  Continue pravastatin  Heart healthy high-fiber low-sodium diet Increase physical activity as tolerated Follows with PCP   Coronary artery disease-continues to deny episodes of chest discomfort.  Status post PCI and stent placement 2016 Continue  aspirin , Eliquis, Imdur, nitroglycerin  Heart healthy high-fiber low-sodium diet Increase physical activity as tolerated   Disposition: Follow-up with Dr. Tresa [name] or me in 3-4 months.   Thomasene Ripple. Marcelline Temkin NP-C     03/05/2023, 1:56 PM  [facility] [address] Office [phone] Fax [phone]    I spent 14 minutes examining this patient, reviewing medications, and using patient centered shared decision making involving her cardiac care.  Prior to her visit I spent greater than 20 minutes reviewing her past medical history,  medications, and prior cardiac tests.

## 2023-03-05 ENCOUNTER — Ambulatory Visit: Payer: Medicare HMO | Attending: Cardiovascular Disease | Admitting: General Practice

## 2023-03-05 ENCOUNTER — Encounter: Payer: Self-pay | Admitting: General Practice

## 2023-03-05 VITALS — BP 86/52 | HR 92 | Ht 62.0 in | Wt 108.2 lb

## 2023-03-05 DIAGNOSIS — I4811 Longstanding persistent atrial fibrillation: Secondary | ICD-10-CM

## 2023-03-05 DIAGNOSIS — Z951 Presence of aortocoronary bypass graft: Secondary | ICD-10-CM | POA: Diagnosis not present

## 2023-03-05 DIAGNOSIS — E785 Hyperlipidemia, unspecified: Secondary | ICD-10-CM | POA: Diagnosis not present

## 2023-03-05 DIAGNOSIS — I2510 Atherosclerotic heart disease of native coronary artery without angina pectoris: Secondary | ICD-10-CM

## 2023-03-05 DIAGNOSIS — I5043 Acute on chronic combined systolic (congestive) and diastolic (congestive) heart failure: Secondary | ICD-10-CM

## 2023-03-05 MED ORDER — FUROSEMIDE 20 MG PO TABS: 20.0000 mg | ORAL_TABLET | ORAL | 3 refills | Status: DC

## 2023-03-05 MED ORDER — FUROSEMIDE 20 MG PO TABS: ORAL_TABLET | ORAL | 3 refills | Status: DC

## 2023-03-05 NOTE — Addendum Note (Signed)
Addended by: Alyson Ingles on: 03/05/2023 02:05 PM   Modules accepted: Orders

## 2023-03-05 NOTE — Patient Instructions (Addendum)
Medication Instructions:  TAKE YOUR FUROSEMIDE (LASIX) Take 1 tablet (20 mg total) by mouth EVERY 4 (four) DAYS   STOP YOUR AMLODIPINE *If you need a refill on your cardiac medications before your next appointment, please call your pharmacy*  Lab Work: BMET TODAY If you have labs (blood work) drawn today and your tests are completely normal, you will receive your results only by:  MyChart Message (if you have MyChart) OR A paper copy in the mail If you have any lab test that is abnormal or we need to change your treatment, we will call you to review the results.  Testing/Procedures: NONE  Follow-Up: At [facility], you and your health needs are our priority.  As part of our continuing mission to provide you with exceptional heart care, we have created designated Provider Care Teams.  These Care Teams include your primary Cardiologist (physician) and Advanced Practice Providers (APPs -  Physician Assistants and Nurse Practitioners) who all work together to provide you with the care you need, when you need it.  Your next appointment:   AFTER 06-24-23   Provider:   Nicki Guadalajara, MD     Other Instructions PLEASE INCREASE HYDRATION

## 2023-03-06 LAB — BASIC METABOLIC PANEL
BUN/Creatinine Ratio: 29 — ABNORMAL HIGH (ref 10–24)
BUN: 28 mg/dL (ref 10–36)
CO2: 23 mmol/L (ref 20–29)
Calcium: 9 mg/dL (ref 8.6–10.2)
Chloride: 98 mmol/L (ref 96–106)
Creatinine, Ser: 0.97 mg/dL (ref 0.76–1.27)
Glucose: 247 mg/dL — ABNORMAL HIGH (ref 70–99)
Potassium: 5.3 mmol/L — ABNORMAL HIGH (ref 3.5–5.2)
Sodium: 137 mmol/L (ref 134–144)
eGFR: 72 mL/min/{1.73_m2} (ref >59)

## 2023-03-24 DIAGNOSIS — E1151 Type 2 diabetes mellitus with diabetic peripheral angiopathy without gangrene: Secondary | ICD-10-CM | POA: Diagnosis not present

## 2023-03-24 DIAGNOSIS — I739 Peripheral vascular disease, unspecified: Secondary | ICD-10-CM | POA: Diagnosis not present

## 2023-03-24 DIAGNOSIS — E1142 Type 2 diabetes mellitus with diabetic polyneuropathy: Secondary | ICD-10-CM | POA: Diagnosis not present

## 2023-03-24 DIAGNOSIS — L603 Nail dystrophy: Secondary | ICD-10-CM | POA: Diagnosis not present

## 2023-04-15 DIAGNOSIS — H40013 Open angle with borderline findings, low risk, bilateral: Secondary | ICD-10-CM | POA: Diagnosis not present

## 2023-04-15 DIAGNOSIS — H353131 Nonexudative age-related macular degeneration, bilateral, early dry stage: Secondary | ICD-10-CM | POA: Diagnosis not present

## 2023-04-15 DIAGNOSIS — E113292 Type 2 diabetes mellitus with mild nonproliferative diabetic retinopathy without macular edema, left eye: Secondary | ICD-10-CM | POA: Diagnosis not present

## 2023-04-15 DIAGNOSIS — H524 Presbyopia: Secondary | ICD-10-CM | POA: Diagnosis not present

## 2023-04-15 DIAGNOSIS — H04123 Dry eye syndrome of bilateral lacrimal glands: Secondary | ICD-10-CM | POA: Diagnosis not present

## 2023-04-19 ENCOUNTER — Ambulatory Visit: Payer: Medicare HMO | Admitting: Cardiovascular Disease

## 2023-04-20 ENCOUNTER — Encounter (INDEPENDENT_AMBULATORY_CARE_PROVIDER_SITE_OTHER): Payer: Medicare HMO | Admitting: Ophthalmology

## 2023-04-20 DIAGNOSIS — H353132 Nonexudative age-related macular degeneration, bilateral, intermediate dry stage: Secondary | ICD-10-CM | POA: Diagnosis not present

## 2023-04-20 DIAGNOSIS — I10 Essential (primary) hypertension: Secondary | ICD-10-CM | POA: Diagnosis not present

## 2023-04-20 DIAGNOSIS — H35033 Hypertensive retinopathy, bilateral: Secondary | ICD-10-CM | POA: Diagnosis not present

## 2023-04-20 DIAGNOSIS — Z7984 Long term (current) use of oral hypoglycemic drugs: Secondary | ICD-10-CM | POA: Diagnosis not present

## 2023-04-20 DIAGNOSIS — H43813 Vitreous degeneration, bilateral: Secondary | ICD-10-CM

## 2023-04-20 DIAGNOSIS — E113392 Type 2 diabetes mellitus with moderate nonproliferative diabetic retinopathy without macular edema, left eye: Secondary | ICD-10-CM | POA: Diagnosis not present

## 2023-05-10 ENCOUNTER — Emergency Department (HOSPITAL_COMMUNITY): Payer: Medicare HMO

## 2023-05-10 ENCOUNTER — Encounter (HOSPITAL_COMMUNITY): Payer: Self-pay | Admitting: Internal Medicine

## 2023-05-10 ENCOUNTER — Inpatient Hospital Stay (HOSPITAL_COMMUNITY)
Admission: EM | Admit: 2023-05-10 | Discharge: 2023-05-17 | DRG: 350 | Disposition: A | Discharge status: Home Health | Payer: Medicare HMO | Attending: Internal Medicine | Admitting: Internal Medicine

## 2023-05-10 ENCOUNTER — Other Ambulatory Visit: Payer: Self-pay

## 2023-05-10 DIAGNOSIS — I5043 Acute on chronic combined systolic (congestive) and diastolic (congestive) heart failure: Secondary | ICD-10-CM | POA: Diagnosis present

## 2023-05-10 DIAGNOSIS — K5669 Other partial intestinal obstruction: Secondary | ICD-10-CM | POA: Diagnosis not present

## 2023-05-10 DIAGNOSIS — J90 Pleural effusion, not elsewhere classified: Secondary | ICD-10-CM | POA: Diagnosis not present

## 2023-05-10 DIAGNOSIS — K56609 Unspecified intestinal obstruction, unspecified as to partial versus complete obstruction: Secondary | ICD-10-CM | POA: Diagnosis not present

## 2023-05-10 DIAGNOSIS — E872 Acidosis, unspecified: Secondary | ICD-10-CM | POA: Diagnosis not present

## 2023-05-10 DIAGNOSIS — I5023 Acute on chronic systolic (congestive) heart failure: Secondary | ICD-10-CM | POA: Diagnosis not present

## 2023-05-10 DIAGNOSIS — Z961 Presence of intraocular lens: Secondary | ICD-10-CM | POA: Diagnosis present

## 2023-05-10 DIAGNOSIS — I25810 Atherosclerosis of coronary artery bypass graft(s) without angina pectoris: Secondary | ICD-10-CM | POA: Diagnosis present

## 2023-05-10 DIAGNOSIS — K4090 Unilateral inguinal hernia, without obstruction or gangrene, not specified as recurrent: Secondary | ICD-10-CM | POA: Diagnosis not present

## 2023-05-10 DIAGNOSIS — I272 Pulmonary hypertension, unspecified: Secondary | ICD-10-CM | POA: Diagnosis not present

## 2023-05-10 DIAGNOSIS — I493 Ventricular premature depolarization: Secondary | ICD-10-CM | POA: Diagnosis present

## 2023-05-10 DIAGNOSIS — E785 Hyperlipidemia, unspecified: Secondary | ICD-10-CM | POA: Diagnosis present

## 2023-05-10 DIAGNOSIS — Z1152 Encounter for screening for COVID-19: Secondary | ICD-10-CM

## 2023-05-10 DIAGNOSIS — N401 Enlarged prostate with lower urinary tract symptoms: Secondary | ICD-10-CM | POA: Diagnosis present

## 2023-05-10 DIAGNOSIS — I4819 Other persistent atrial fibrillation: Secondary | ICD-10-CM

## 2023-05-10 DIAGNOSIS — I447 Left bundle-branch block, unspecified: Secondary | ICD-10-CM | POA: Diagnosis present

## 2023-05-10 DIAGNOSIS — R059 Cough, unspecified: Secondary | ICD-10-CM | POA: Diagnosis not present

## 2023-05-10 DIAGNOSIS — R9389 Abnormal findings on diagnostic imaging of other specified body structures: Secondary | ICD-10-CM | POA: Diagnosis not present

## 2023-05-10 DIAGNOSIS — R54 Age-related physical debility: Secondary | ICD-10-CM | POA: Diagnosis present

## 2023-05-10 DIAGNOSIS — I110 Hypertensive heart disease with heart failure: Secondary | ICD-10-CM | POA: Diagnosis not present

## 2023-05-10 DIAGNOSIS — E8729 Other acidosis: Secondary | ICD-10-CM

## 2023-05-10 DIAGNOSIS — K4030 Unilateral inguinal hernia, with obstruction, without gangrene, not specified as recurrent: Secondary | ICD-10-CM | POA: Diagnosis not present

## 2023-05-10 DIAGNOSIS — J069 Acute upper respiratory infection, unspecified: Secondary | ICD-10-CM | POA: Diagnosis present

## 2023-05-10 DIAGNOSIS — R0602 Shortness of breath: Secondary | ICD-10-CM | POA: Diagnosis not present

## 2023-05-10 DIAGNOSIS — I4811 Longstanding persistent atrial fibrillation: Secondary | ICD-10-CM | POA: Diagnosis present

## 2023-05-10 DIAGNOSIS — D563 Thalassemia minor: Secondary | ICD-10-CM | POA: Diagnosis present

## 2023-05-10 DIAGNOSIS — Z91014 Allergy to mammalian meats: Secondary | ICD-10-CM

## 2023-05-10 DIAGNOSIS — I5022 Chronic systolic (congestive) heart failure: Secondary | ICD-10-CM | POA: Diagnosis not present

## 2023-05-10 DIAGNOSIS — R918 Other nonspecific abnormal finding of lung field: Secondary | ICD-10-CM | POA: Diagnosis not present

## 2023-05-10 DIAGNOSIS — R079 Chest pain, unspecified: Secondary | ICD-10-CM | POA: Diagnosis not present

## 2023-05-10 DIAGNOSIS — I255 Ischemic cardiomyopathy: Secondary | ICD-10-CM | POA: Diagnosis present

## 2023-05-10 DIAGNOSIS — I700 Atherosclerosis of aorta: Secondary | ICD-10-CM | POA: Diagnosis present

## 2023-05-10 DIAGNOSIS — R188 Other ascites: Secondary | ICD-10-CM | POA: Diagnosis not present

## 2023-05-10 DIAGNOSIS — K219 Gastro-esophageal reflux disease without esophagitis: Secondary | ICD-10-CM | POA: Diagnosis present

## 2023-05-10 DIAGNOSIS — I2510 Atherosclerotic heart disease of native coronary artery without angina pectoris: Secondary | ICD-10-CM | POA: Diagnosis present

## 2023-05-10 DIAGNOSIS — Z7984 Long term (current) use of oral hypoglycemic drugs: Secondary | ICD-10-CM

## 2023-05-10 DIAGNOSIS — Z7952 Long term (current) use of systemic steroids: Secondary | ICD-10-CM

## 2023-05-10 DIAGNOSIS — K4000 Bilateral inguinal hernia, with obstruction, without gangrene, not specified as recurrent: Secondary | ICD-10-CM | POA: Diagnosis not present

## 2023-05-10 DIAGNOSIS — K4020 Bilateral inguinal hernia, without obstruction or gangrene, not specified as recurrent: Secondary | ICD-10-CM | POA: Diagnosis not present

## 2023-05-10 DIAGNOSIS — I959 Hypotension, unspecified: Secondary | ICD-10-CM | POA: Diagnosis not present

## 2023-05-10 DIAGNOSIS — Z951 Presence of aortocoronary bypass graft: Secondary | ICD-10-CM | POA: Diagnosis not present

## 2023-05-10 DIAGNOSIS — R350 Frequency of micturition: Secondary | ICD-10-CM | POA: Diagnosis not present

## 2023-05-10 DIAGNOSIS — Z974 Presence of external hearing-aid: Secondary | ICD-10-CM

## 2023-05-10 DIAGNOSIS — E119 Type 2 diabetes mellitus without complications: Secondary | ICD-10-CM | POA: Diagnosis not present

## 2023-05-10 DIAGNOSIS — Z888 Allergy status to other drugs, medicaments and biological substances status: Secondary | ICD-10-CM

## 2023-05-10 DIAGNOSIS — D509 Iron deficiency anemia, unspecified: Secondary | ICD-10-CM | POA: Diagnosis present

## 2023-05-10 DIAGNOSIS — Z79899 Other long term (current) drug therapy: Secondary | ICD-10-CM | POA: Diagnosis not present

## 2023-05-10 DIAGNOSIS — Z823 Family history of stroke: Secondary | ICD-10-CM

## 2023-05-10 DIAGNOSIS — I4891 Unspecified atrial fibrillation: Secondary | ICD-10-CM | POA: Diagnosis not present

## 2023-05-10 DIAGNOSIS — Z882 Allergy status to sulfonamides status: Secondary | ICD-10-CM

## 2023-05-10 DIAGNOSIS — I25119 Atherosclerotic heart disease of native coronary artery with unspecified angina pectoris: Secondary | ICD-10-CM | POA: Diagnosis not present

## 2023-05-10 DIAGNOSIS — Z955 Presence of coronary angioplasty implant and graft: Secondary | ICD-10-CM

## 2023-05-10 DIAGNOSIS — Z9842 Cataract extraction status, left eye: Secondary | ICD-10-CM

## 2023-05-10 DIAGNOSIS — J449 Chronic obstructive pulmonary disease, unspecified: Secondary | ICD-10-CM | POA: Diagnosis present

## 2023-05-10 DIAGNOSIS — I517 Cardiomegaly: Secondary | ICD-10-CM | POA: Diagnosis not present

## 2023-05-10 DIAGNOSIS — Z9049 Acquired absence of other specified parts of digestive tract: Secondary | ICD-10-CM

## 2023-05-10 DIAGNOSIS — Z7901 Long term (current) use of anticoagulants: Secondary | ICD-10-CM

## 2023-05-10 DIAGNOSIS — J9811 Atelectasis: Secondary | ICD-10-CM | POA: Diagnosis not present

## 2023-05-10 DIAGNOSIS — Z9841 Cataract extraction status, right eye: Secondary | ICD-10-CM

## 2023-05-10 DIAGNOSIS — R319 Hematuria, unspecified: Secondary | ICD-10-CM | POA: Diagnosis not present

## 2023-05-10 DIAGNOSIS — I509 Heart failure, unspecified: Secondary | ICD-10-CM | POA: Diagnosis not present

## 2023-05-10 DIAGNOSIS — K4031 Unilateral inguinal hernia, with obstruction, without gangrene, recurrent: Secondary | ICD-10-CM | POA: Diagnosis not present

## 2023-05-10 LAB — TROPONIN I (HIGH SENSITIVITY)
Troponin I (High Sensitivity): 7 ng/L (ref <18)
Troponin I (High Sensitivity): 9 ng/L (ref <18)

## 2023-05-10 LAB — COMPREHENSIVE METABOLIC PANEL
ALT: 14 U/L (ref 0–44)
AST: 30 U/L (ref 15–41)
Albumin: 3.9 g/dL (ref 3.5–5.0)
Alkaline Phosphatase: 38 U/L (ref 38–126)
Anion gap: 19 — ABNORMAL HIGH (ref 5–15)
BUN: 23 mg/dL (ref 8–23)
CO2: 21 mmol/L — ABNORMAL LOW (ref 22–32)
Calcium: 9.1 mg/dL (ref 8.9–10.3)
Chloride: 98 mmol/L (ref 98–111)
Creatinine, Ser: 0.81 mg/dL (ref 0.61–1.24)
GFR, Estimated: >60 mL/min (ref >60)
Glucose, Bld: 194 mg/dL — ABNORMAL HIGH (ref 70–99)
Potassium: 4.6 mmol/L (ref 3.5–5.1)
Sodium: 138 mmol/L (ref 135–145)
Total Bilirubin: 1.2 mg/dL (ref 0.3–1.2)
Total Protein: 6.7 g/dL (ref 6.5–8.1)

## 2023-05-10 LAB — CBC WITH DIFFERENTIAL/PLATELET
Abs Immature Granulocytes: 0 10*3/uL (ref 0.00–0.07)
Basophils Absolute: 0 10*3/uL (ref 0.0–0.1)
Basophils Relative: 0 %
Eosinophils Absolute: 0 10*3/uL (ref 0.0–0.5)
Eosinophils Relative: 0 %
HCT: 31.7 % — ABNORMAL LOW (ref 39.0–52.0)
Hemoglobin: 9.7 g/dL — ABNORMAL LOW (ref 13.0–17.0)
Lymphocytes Relative: 9 %
Lymphs Abs: 0.8 10*3/uL (ref 0.7–4.0)
MCH: 19.4 pg — ABNORMAL LOW (ref 26.0–34.0)
MCHC: 30.6 g/dL (ref 30.0–36.0)
MCV: 63.5 fL — ABNORMAL LOW (ref 80.0–100.0)
Monocytes Absolute: 0.3 10*3/uL (ref 0.1–1.0)
Monocytes Relative: 3 %
Neutro Abs: 8.1 10*3/uL — ABNORMAL HIGH (ref 1.7–7.7)
Neutrophils Relative %: 88 %
Platelets: 220 10*3/uL (ref 150–400)
RBC: 4.99 MIL/uL (ref 4.22–5.81)
RDW: 16.2 % — ABNORMAL HIGH (ref 11.5–15.5)
WBC: 9.2 10*3/uL (ref 4.0–10.5)
nRBC: 0 % (ref 0.0–0.2)
nRBC: 1 /100 WBC — ABNORMAL HIGH

## 2023-05-10 LAB — HEMOGLOBIN A1C
Hgb A1c MFr Bld: 6 % — ABNORMAL HIGH (ref 4.8–5.6)
Mean Plasma Glucose: 125.5 mg/dL

## 2023-05-10 LAB — URINALYSIS, ROUTINE W REFLEX MICROSCOPIC
Bilirubin Urine: NEGATIVE
Glucose, UA: NEGATIVE mg/dL
Hgb urine dipstick: NEGATIVE
Ketones, ur: 5 mg/dL — AB
Leukocytes,Ua: NEGATIVE
Nitrite: NEGATIVE
Protein, ur: 30 mg/dL — AB
Specific Gravity, Urine: >1.046 — ABNORMAL HIGH (ref 1.005–1.030)
pH: 5 (ref 5.0–8.0)

## 2023-05-10 LAB — LIPASE, BLOOD: Lipase: 28 U/L (ref 11–51)

## 2023-05-10 LAB — PHOSPHORUS: Phosphorus: 3.8 mg/dL (ref 2.5–4.6)

## 2023-05-10 LAB — LACTIC ACID, PLASMA
Lactic Acid, Venous: 1.1 mmol/L (ref 0.5–1.9)
Lactic Acid, Venous: 1 mmol/L (ref 0.5–1.9)

## 2023-05-10 LAB — RESP PANEL BY RT-PCR (RSV, FLU A&B, COVID)  RVPGX2
Influenza A by PCR: NEGATIVE
Influenza B by PCR: NEGATIVE
Resp Syncytial Virus by PCR: NEGATIVE
SARS Coronavirus 2 by RT PCR: NEGATIVE

## 2023-05-10 LAB — MAGNESIUM: Magnesium: 1.9 mg/dL (ref 1.7–2.4)

## 2023-05-10 LAB — GLUCOSE, CAPILLARY
Glucose-Capillary: 114 mg/dL — ABNORMAL HIGH (ref 70–99)
Glucose-Capillary: 109 mg/dL — ABNORMAL HIGH (ref 70–99)

## 2023-05-10 LAB — BRAIN NATRIURETIC PEPTIDE: B Natriuretic Peptide: 1170.3 pg/mL — ABNORMAL HIGH (ref 0.0–100.0)

## 2023-05-10 MED ORDER — ENOXAPARIN SODIUM 30 MG/0.3ML IJ SOSY
30.0000 mg | PREFILLED_SYRINGE | INTRAMUSCULAR | Status: DC
  Administered 2023-05-10: 30 mg via SUBCUTANEOUS
  Filled 2023-05-10: qty 0.3

## 2023-05-10 MED ORDER — ONDANSETRON HCL 4 MG PO TABS: 4.0000 mg | ORAL_TABLET | Freq: Four times a day (QID) | ORAL | Status: DC | PRN

## 2023-05-10 MED ORDER — AMOXICILLIN 500 MG PO CAPS
500.0000 mg | ORAL_CAPSULE | Freq: Two times a day (BID) | ORAL | Status: AC
  Administered 2023-05-10 – 2023-05-12 (×4): 500 mg via ORAL
  Filled 2023-05-10 (×4): qty 1

## 2023-05-10 MED ORDER — DIGOXIN 0.25 MG/ML IJ SOLN: 0.1250 mg | Freq: Once | INTRAMUSCULAR | Status: DC

## 2023-05-10 MED ORDER — ONDANSETRON HCL 4 MG/2ML IJ SOLN: 4.0000 mg | Freq: Four times a day (QID) | INTRAMUSCULAR | Status: DC | PRN

## 2023-05-10 MED ORDER — LACTATED RINGERS IV BOLUS
500.0000 mL | Freq: Once | INTRAVENOUS | Status: AC
  Administered 2023-05-10: 500 mL via INTRAVENOUS

## 2023-05-10 MED ORDER — ONDANSETRON HCL 4 MG/2ML IJ SOLN
4.0000 mg | Freq: Once | INTRAMUSCULAR | Status: DC
  Filled 2023-05-10: qty 2

## 2023-05-10 MED ORDER — HYDROMORPHONE HCL 1 MG/ML IJ SOLN: 0.5000 mg | INTRAMUSCULAR | Status: DC | PRN

## 2023-05-10 MED ORDER — KETOROLAC TROMETHAMINE 0.5 % OP SOLN
1.0000 [drp] | Freq: Four times a day (QID) | OPHTHALMIC | Status: DC
  Administered 2023-05-10 – 2023-05-17 (×25): 1 [drp] via OPHTHALMIC
  Filled 2023-05-10: qty 5

## 2023-05-10 MED ORDER — INSULIN ASPART 100 UNIT/ML IJ SOLN
0.0000 [IU] | Freq: Three times a day (TID) | INTRAMUSCULAR | Status: DC
  Administered 2023-05-11: 2 [IU] via SUBCUTANEOUS

## 2023-05-10 MED ORDER — METOPROLOL TARTRATE 5 MG/5ML IV SOLN: 5.0000 mg | Freq: Four times a day (QID) | INTRAVENOUS | Status: DC | PRN

## 2023-05-10 MED ORDER — IOHEXOL 350 MG/ML SOLN
75.0000 mL | Freq: Once | INTRAVENOUS | Status: AC | PRN
  Administered 2023-05-10: 75 mL via INTRAVENOUS

## 2023-05-10 MED ORDER — FENTANYL CITRATE PF 50 MCG/ML IJ SOSY
25.0000 ug | PREFILLED_SYRINGE | Freq: Once | INTRAMUSCULAR | Status: AC
  Administered 2023-05-10: 25 ug via INTRAVENOUS
  Filled 2023-05-10: qty 1

## 2023-05-10 MED ORDER — SODIUM CHLORIDE 0.9 % IV SOLN: INTRAVENOUS | Status: DC

## 2023-05-10 MED ORDER — ALBUTEROL SULFATE HFA 108 (90 BASE) MCG/ACT IN AERS: 2.0000 | INHALATION_SPRAY | RESPIRATORY_TRACT | Status: DC | PRN

## 2023-05-10 MED ORDER — ALBUTEROL SULFATE (2.5 MG/3ML) 0.083% IN NEBU: 2.5000 mg | INHALATION_SOLUTION | RESPIRATORY_TRACT | Status: DC | PRN

## 2023-05-10 NOTE — Plan of Care (Signed)

## 2023-05-10 NOTE — ED Notes (Signed)
ED TO INPATIENT HANDOFF REPORT  ED Nurse Name and Phone #: Waunita Schooner Name/Age/Gender Beverely Low 89 y.o. male Room/Bed: RESUSC/RESUSC  Code Status   Code Status: Full Code  Home/SNF/Other Home Patient oriented to: self, place, time, and situation Is this baseline? Yes   Triage Complete: Triage complete  Chief Complaint SBO (small bowel obstruction) (HCC) [K56.609]  Triage Note Patient's family reports R inguinal hernia present x 2-3 years. Patient has seen surgeon and has been told they can do nothing about it. Patient also reports epigastric pain since 0400 today and spitting up some brown-red phlegm today.    Allergies Allergies  Allergen Reactions   Aceon [Perindopril Erbumine] Shortness Of Breath and Cough   Sulfa Antibiotics Swelling   Pork-Derived Products     muslim    Level of Care/Admitting Diagnosis ED Disposition     ED Disposition  Admit   Condition  --   Comment  Hospital Area: [facility] [[id number]]  Level of Care: Progressive [102]  Admit to Progressive based on following criteria: MULTISYSTEM THREATS such as stable sepsis, metabolic/electrolyte imbalance with or without encephalopathy that is responding to early treatment.  May admit patient to [facility] or [facility] if equivalent level of care is available:: No  Covid Evaluation: Asymptomatic - no recent exposure (last 10 days) testing not required  Diagnosis: SBO (small bowel obstruction) [facility]) [[id number]]  Admitting Physician: Emeline General [[id number]]  Attending Physician: Emeline General [[id number]]  Certification:: I certify this patient will need inpatient services for at least 2 midnights  Estimated Length of Stay: 2          B Medical/Surgery History Past Medical History:  Diagnosis Date   Aortic atherosclerosis (HCC)    Arthritis    Benign localized prostatic hyperplasia with lower urinary tract symptoms (LUTS)    Chronic chest pain    takes imdur   Chronic  combined systolic (congestive) and diastolic (congestive) heart failure (HCC)    followed by cardiology   Chronic dyspnea    Cognitive impairment    COPD, moderate (HCC)    Edema of both lower extremities    takes lasix and uses teds   First degree heart block    GERD (gastroesophageal reflux disease)    Heart murmur    History of cardiovascular stress test    Lexiscan Myoview 7/16:  EF 51%, inferior, inferolateral ischemia; Intermediate Risk   History of smallpox    HOH (hard of hearing)    even with hearing aids   Hypertension    Ischemic heart disease 2005   Dr Tresa Endo a. s/p CABG;  b.  LHC 05/23/15:  S-RPAVB ok, S-D2 ok, S-OM2 99% (s/p Synergy DES), L-LAD ok, EF normal (complicated by pseudoaneurysm)    LBBB (left bundle branch block)    Right arm weakness    S/P CABG x 4    01-08-2004   S/P drug eluting coronary stent placement    05-21-2015  PCI and DES x1 to SVG--OM graft   Thalassemia minor    Type 2 diabetes, diet controlled (HCC)    Wears glasses    Wears hearing aid in both ears    Past Surgical History:  Procedure Laterality Date   APPENDECTOMY     CARDIAC CATHETERIZATION  1989   in [place]   normal     CARDIAC CATHETERIZATION N/A 05/23/2015   Procedure: Left Heart Cath and Cors/Grafts Angiography;  Surgeon: Lennette Bihari, MD;  Location: [facility] INVASIVE CV LAB;  Service: Cardiovascular;  Laterality: N/A;   CARDIAC CATHETERIZATION N/A 05/23/2015   Procedure: Coronary Stent Intervention;  Surgeon: Lennette Bihari, MD;  Location: [facility] INVASIVE CV LAB;  Service: Cardiovascular;  Laterality: N/A;   CARDIAC CATHETERIZATION  01-04-2004   dr Melburn Popper  @[facility]    severe 3V CAD, normal LVF   CATARACT EXTRACTION W/ INTRAOCULAR LENS  IMPLANT, BILATERAL     CORONARY ARTERY BYPASS GRAFT  01-08-2004  dr hendrickson @[facility]    LIMA to LAD,  SVG to D1,  SVG to OM,  SVG to PDA   FOOT SURGERY Right    INGUINAL HERNIA REPAIR Bilateral 1978   MASS EXCISION Left 11/20/2019   Procedure: EXCISION OF  SEBACEOUS CYST CHEST;  Surgeon: Rodman Pickle, MD;  Location: [facility];  Service: General;  Laterality: Left;     A IV Location/Drains/Wounds Patient Lines/Drains/Airways Status     Active Line/Drains/Airways     Name Placement date Placement time Site Days   Peripheral IV 05/10/23 20 G Right Antecubital 05/10/23  0926  Antecubital  less than 1   Incision (Closed) 11/20/19 Chest 11/20/19  1436  -- 1267            Intake/Output Last 24 hours  Intake/Output Summary (Last 24 hours) at 05/10/2023 1439 Last data filed at 05/10/2023 1131 Gross per 24 hour  Intake 500 ml  Output --  Net 500 ml    Labs/Imaging Results for orders placed or performed during the hospital encounter of 05/10/23 (from the past 48 hour(s))  CBC with Differential     Status: Abnormal   Collection Time: 05/10/23  9:18 AM  Result Value Ref Range   WBC 9.2 4.0 - 10.5 K/uL   RBC 4.99 4.22 - 5.81 MIL/uL   Hemoglobin 9.7 (L) 13.0 - 17.0 g/dL   HCT 29.5 (L) 62.1 - 30.8 %   MCV 63.5 (L) 80.0 - 100.0 fL   MCH 19.4 (L) 26.0 - 34.0 pg   MCHC 30.6 30.0 - 36.0 g/dL   RDW 65.7 (H) 84.6 - 96.2 %   Platelets 220 150 - 400 K/uL    Comment: REPEATED TO VERIFY   nRBC 0.0 0.0 - 0.2 %   Neutrophils Relative % 88 %   Neutro Abs 8.1 (H) 1.7 - 7.7 K/uL   Lymphocytes Relative 9 %   Lymphs Abs 0.8 0.7 - 4.0 K/uL   Monocytes Relative 3 %   Monocytes Absolute 0.3 0.1 - 1.0 K/uL   Eosinophils Relative 0 %   Eosinophils Absolute 0.0 0.0 - 0.5 K/uL   Basophils Relative 0 %   Basophils Absolute 0.0 0.0 - 0.1 K/uL   nRBC 1 (H) 0 /100 WBC   Abs Immature Granulocytes 0.00 0.00 - 0.07 K/uL   Schistocytes PRESENT    Polychromasia PRESENT    Ovalocytes PRESENT     Comment: Performed at [facility], [address], [place], [place] [address]  Comprehensive metabolic panel     Status: Abnormal   Collection Time: 05/10/23  9:18 AM  Result Value Ref Range   Sodium 138 135 - 145 mmol/L    Potassium 4.6 3.5 - 5.1 mmol/L   Chloride 98 98 - 111 mmol/L   CO2 21 (L) 22 - 32 mmol/L   Glucose, Bld 194 (H) 70 - 99 mg/dL    Comment: Glucose reference range applies only to samples taken after fasting for at least 8 hours.  BUN 23 8 - 23 mg/dL   Creatinine, Ser 1.61 0.61 - 1.24 mg/dL   Calcium 9.1 8.9 - 09.6 mg/dL   Total Protein 6.7 6.5 - 8.1 g/dL   Albumin 3.9 3.5 - 5.0 g/dL   AST 30 15 - 41 U/L   ALT 14 0 - 44 U/L   Alkaline Phosphatase 38 38 - 126 U/L   Total Bilirubin 1.2 0.3 - 1.2 mg/dL   GFR, Estimated >04 >54 mL/min    Comment: (NOTE) Calculated using the CKD-EPI Creatinine Equation (2021)    Anion gap 19 (H) 5 - 15    Comment: Performed at [facility], [address] [place], [place] [address]  Lipase, blood     Status: None   Collection Time: 05/10/23  9:18 AM  Result Value Ref Range   Lipase 28 11 - 51 U/L    Comment: Performed at [facility], [address], [place], [place] [address]  Troponin I (High Sensitivity)     Status: None   Collection Time: 05/10/23  9:18 AM  Result Value Ref Range   Troponin I (High Sensitivity) 7 <18 ng/L    Comment: (NOTE) Elevated high sensitivity troponin I (hsTnI) values and significant  changes across serial measurements may suggest ACS but many other  chronic and acute conditions are known to elevate hsTnI results.  Refer to the "Links" section for chest pain algorithms and additional  guidance. Performed at [facility], [address] [place], [place] [address]   Brain natriuretic peptide     Status: Abnormal   Collection Time: 05/10/23 11:00 AM  Result Value Ref Range   B Natriuretic Peptide 1,170.3 (H) 0.0 - 100.0 pg/mL    Comment: Performed at [facility], [address], [place], [place] [address]  Resp panel by RT-PCR (RSV, Flu A&B, Covid) Anterior Nasal Swab     Status: None   Collection Time: 05/10/23 11:11 AM   Specimen: Anterior Nasal Swab  Result Value Ref Range   SARS  Coronavirus 2 by RT PCR NEGATIVE NEGATIVE   Influenza A by PCR NEGATIVE NEGATIVE   Influenza B by PCR NEGATIVE NEGATIVE    Comment: (NOTE) The Xpert Xpress SARS-CoV-2/FLU/RSV plus assay is intended as an aid in the diagnosis of influenza from Nasopharyngeal swab specimens and should not be used as a sole basis for treatment. Nasal washings and aspirates are unacceptable for Xpert Xpress SARS-CoV-2/FLU/RSV testing.  Fact Sheet for Patients: BloggerCourse.com  Fact Sheet for Healthcare Providers: SeriousBroker.it  This test is not yet approved or cleared by the [place] FDA and has been authorized for detection and/or diagnosis of SARS-CoV-2 by FDA under an Emergency Use Authorization (EUA). This EUA will remain in effect (meaning this test can be used) for the duration of the COVID-19 declaration under Section 564(b)(1) of the Act, 21 U.S.C. section 360bbb-3(b)(1), unless the authorization is terminated or revoked.     Resp Syncytial Virus by PCR NEGATIVE NEGATIVE    Comment: (NOTE) Fact Sheet for Patients: BloggerCourse.com  Fact Sheet for Healthcare Providers: SeriousBroker.it  This test is not yet approved or cleared by the [place] FDA and has been authorized for detection and/or diagnosis of SARS-CoV-2 by FDA under an Emergency Use Authorization (EUA). This EUA will remain in effect (meaning this test can be used) for the duration of the COVID-19 declaration under Section 564(b)(1) of the Act, 21 U.S.C. section 360bbb-3(b)(1), unless the authorization is terminated or  revoked.  Performed at [facility], [address] [place], [place] [address]   Troponin I (High Sensitivity)     Status: None   Collection Time: 05/10/23 12:03 PM  Result Value Ref Range   Troponin I (High Sensitivity) 9 <18 ng/L    Comment: (NOTE) Elevated high sensitivity troponin I  (hsTnI) values and significant  changes across serial measurements may suggest ACS but many other  chronic and acute conditions are known to elevate hsTnI results.  Refer to the "Links" section for chest pain algorithms and additional  guidance. Performed at [facility], [address], [place], [place] [address]   Lactic acid, plasma     Status: None   Collection Time: 05/10/23 12:39 PM  Result Value Ref Range   Lactic Acid, Venous 1.1 0.5 - 1.9 mmol/L    Comment: Performed at [facility], [address], [place], [place] [address]  Urinalysis, Routine w reflex microscopic -Urine, Clean Catch     Status: Abnormal   Collection Time: 05/10/23  1:05 PM  Result Value Ref Range   Color, Urine AMBER (A) YELLOW    Comment: BIOCHEMICALS MAY BE AFFECTED BY COLOR   APPearance CLEAR CLEAR   Specific Gravity, Urine >1.046 (H) 1.005 - 1.030   pH 5.0 5.0 - 8.0   Glucose, UA NEGATIVE NEGATIVE mg/dL   Hgb urine dipstick NEGATIVE NEGATIVE   Bilirubin Urine NEGATIVE NEGATIVE   Ketones, ur 5 (A) NEGATIVE mg/dL   Protein, ur 30 (A) NEGATIVE mg/dL   Nitrite NEGATIVE NEGATIVE   Leukocytes,Ua NEGATIVE NEGATIVE   RBC / HPF 6-10 0 - 5 RBC/hpf   WBC, UA 0-5 0 - 5 WBC/hpf   Bacteria, UA RARE (A) NONE SEEN   Squamous Epithelial / HPF 0-5 0 - 5 /HPF   Mucus PRESENT    Ca Oxalate Crys, UA PRESENT     Comment: Performed at [facility], [address] [place], [place] [address]   CT ABDOMEN PELVIS W CONTRAST  Result Date: 05/10/2023 CLINICAL DATA:  Right inguinal hernia, epigastric pain EXAM: CT ABDOMEN AND PELVIS WITH CONTRAST TECHNIQUE: Multidetector CT imaging of the abdomen and pelvis was performed using the standard protocol following bolus administration of intravenous contrast. RADIATION DOSE REDUCTION: This exam was performed according to the departmental dose-optimization program which includes automated exposure control, adjustment of the Zyaire Dumas and/or kV according to patient size  and/or use of iterative reconstruction technique. CONTRAST:  75mL OMNIPAQUE IOHEXOL 350 MG/ML SOLN COMPARISON:  CT chest 07/22/2015 FINDINGS: Lower chest: Small pleural effusions, new since previous, with atelectasis posteriorly at the lung bases. Post median sternotomy. Coronary and aortic atheromatous calcifications. Mitral annulus calcifications. Hepatobiliary: No focal liver abnormality is seen. No gallstones, gallbladder wall thickening, or biliary dilatation. Pancreas: Unremarkable. No pancreatic ductal dilatation or surrounding inflammatory changes. Spleen: Normal in size without focal abnormality. Adrenals/Urinary Tract: She no adrenal mass. Symmetric renal parenchymal enhancement. Multiple cortical lesions in both kidneys, some of which can be characterized as cysts, largest 2.1 cm 5 HU right upper pole; no followup recommended. No evident urolithiasis or hydronephrosis. Urinary bladder physiologically distended. Stomach/Bowel: Stomach is decompressed. Duodenum is nondistended, unremarkable. Proximal small bowel is decompressed. There are multiple dilated mid small bowel loops. A loop of small bowel is involved in right inguinal hernia, and this appears to be a transition point to decompressed distal small bowel. The colon is incompletely distended, with a few scattered diverticula distally. Vascular/Lymphatic: Aortoiliac calcified  atheromatous plaque. Portal vein patent. No abdominal or pelvic adenopathy. Reproductive: Prostate enlargement with central coarse calcifications. Other: Small volume scattered abdominal and pelvic ascites. Fluid extends into bilateral inguinal hernias. Musculoskeletal: Lumbar levoscoliosis with multilevel spondylitic change. Heterogenous mineralization of the bones. No fracture or or discrete lesion. Sternotomy wires. IMPRESSION: 1. Small-bowel obstruction secondary to right inguinal hernia. 2. Small volume ascites. 3. Small pleural effusions. 4.  Aortic Atherosclerosis  (ICD10-I70.0). Electronically Signed   By: Corlis Leak M.D.   On: 05/10/2023 11:29   DG Chest Portable 1 View  Result Date: 05/10/2023 CLINICAL DATA:  Cough and chest pain. EXAM: PORTABLE CHEST 1 VIEW COMPARISON:  April 09, 2018 FINDINGS: Enlarged cardiac silhouette. Stable postsurgical changes from CABG. Chronic elevation of the left hemidiaphragm. Hazy airspace opacities throughout both lungs may represent hypoventilatory changes,, mixed pattern pulmonary edema or atypical/viral pneumonia. IMPRESSION: 1. Hazy airspace opacities throughout both lungs may represent hypoventilatory changes, mixed pattern pulmonary edema or atypical/viral pneumonia. 2. Enlarged cardiac silhouette. Electronically Signed   By: Ted Mcalpine M.D.   On: 05/10/2023 10:57    Pending Labs Unresulted Labs (From admission, onward)     Start     Ordered   05/11/23 0500  CBC  Tomorrow morning,   R        05/10/23 1428   05/11/23 0500  Basic metabolic panel  Tomorrow morning,   R        05/10/23 1428   05/10/23 1429  Hemoglobin A1c  Once,   R       Comments: To assess prior glycemic control    05/10/23 1428   05/10/23 1039  Lactic acid, plasma  Now then every 2 hours,   R (with STAT occurrences)      05/10/23 1038   05/10/23 0918  Pathologist smear review  Once,   R        05/10/23 0918            Vitals/Pain Today's Vitals   05/10/23 1203 05/10/23 1207 05/10/23 1245 05/10/23 1330  BP:   101/67 124/72  Pulse:   (!) 112 99  Resp:   12 15  Temp: 98.2 F (36.8 C)     TempSrc: Oral     SpO2:   99% 99%  PainSc:  5       Isolation Precautions No active isolations  Medications Medications  ondansetron (ZOFRAN) injection 4 mg (4 mg Intravenous Patient Refused/Not Given 05/10/23 1205)  albuterol (VENTOLIN HFA) 108 (90 Base) MCG/ACT inhaler 2 puff (has no administration in time range)  enoxaparin (LOVENOX) injection 30 mg (has no administration in time range)  0.9 %  sodium chloride infusion (has no  administration in time range)  HYDROmorphone (DILAUDID) injection 0.5-1 mg (has no administration in time range)  ondansetron (ZOFRAN) tablet 4 mg (has no administration in time range)    Or  ondansetron (ZOFRAN) injection 4 mg (has no administration in time range)  insulin aspart (novoLOG) injection 0-9 Units (has no administration in time range)  metoprolol tartrate (LOPRESSOR) injection 5 mg (has no administration in time range)  lactated ringers bolus 500 mL (0 mLs Intravenous Stopped 05/10/23 1131)  fentaNYL (SUBLIMAZE) injection 25 mcg (25 mcg Intravenous Given 05/10/23 0954)  iohexol (OMNIPAQUE) 350 MG/ML injection 75 mL (75 mLs Intravenous Contrast Given 05/10/23 1115)  fentaNYL (SUBLIMAZE) injection 25 mcg (25 mcg Intravenous Given 05/10/23 1207)  lactated ringers bolus 500 mL (500 mLs Intravenous New Bag/Given 05/10/23 1320)    Mobility  walks     Focused Assessments Pulmonary Assessment Handoff:  Lung sounds:   O2 Device: Room Air      R Recommendations: See Admitting Provider Note  Report given to:   Additional Notes:

## 2023-05-10 NOTE — H&P (Addendum)
History and Physical    Dakota [name]:[id number] DOB: [birth year] DOA: 05/10/2023  PCP: [name], MD (Confirm with patient/family/NH records and if not entered, this has to be entered at [facility] point of entry) Patient coming from: Home  I have personally briefly reviewed patient's old medical records in [facility]  Chief Complaint: Hernia comes back and abdominal pain  HPI: Dakota [name] is a 89 y.o. male with medical history significant of chronic right inguinal hernia, PAF on Eliquis, CAD status post CABG x 4, chronic HFrEF LVEF 30-50%, HTN, IIDM, presented with recurrent nonreducible right hernia and abdominal pain.  At baseline patient has a history of chronic right inguinal hernia developed 3 to 4 years ago was told by surgeon not a candidate for any surgery because of his age and heart conditions.  Patient reported the hernia has self reducible and has not caused any problems until the day before yesterday.  Patient again noticed the hernia sac came out but this time remained not reducible by itself and patient's last movement of his bowels was yesterday.  And after that patient started develop abdominal pain cramping-like initially mild then overnight became 8/10 constant, associate with nausea but no vomiting, no passing gas since last night. ED Course: Tachycardia, blood pressure SBP 120-140s, afebrile nonhypoxic.  CT abdomen pelvis showed small bowel obstruction secondary to right inguinal hernia.  ED physician was able to push the hernia sac back however soon after, hernia sac again comes out.  Hemoglobin 10.7, WBC 9.2, K4.6 creatinine 0.8 glucose 194.  Review of Systems: As per HPI otherwise 14 point review of systems negative.    Past Medical History:  Diagnosis Date   Aortic atherosclerosis (HCC)    Arthritis    Benign localized prostatic hyperplasia with lower urinary tract symptoms (LUTS)    Chronic chest pain    takes imdur   Chronic combined systolic  (congestive) and diastolic (congestive) heart failure (HCC)    followed by cardiology   Chronic dyspnea    Cognitive impairment    COPD, moderate (HCC)    Edema of both lower extremities    takes lasix and uses teds   First degree heart block    GERD (gastroesophageal reflux disease)    Heart murmur    History of cardiovascular stress test    Lexiscan Myoview 7/16:  EF 51%, inferior, inferolateral ischemia; Intermediate Risk   History of smallpox    HOH (hard of hearing)    even with hearing aids   Hypertension    Ischemic heart disease 2005   Dr Tresa Endo a. s/p CABG;  b.  LHC 05/23/15:  S-RPAVB ok, S-D2 ok, S-OM2 99% (s/p Synergy DES), L-LAD ok, EF normal (complicated by pseudoaneurysm)    LBBB (left bundle branch block)    Right arm weakness    S/P CABG x 4    01-08-2004   S/P drug eluting coronary stent placement    05-21-2015  PCI and DES x1 to SVG--OM graft   Thalassemia minor    Type 2 diabetes, diet controlled (HCC)    Wears glasses    Wears hearing aid in both ears     Past Surgical History:  Procedure Laterality Date   APPENDECTOMY     CARDIAC CATHETERIZATION  1989   in [place]   normal     CARDIAC CATHETERIZATION N/A 05/23/2015   Procedure: Left Heart Cath and Cors/Grafts Angiography;  Surgeon: Lennette Bihari, MD;  Location: [facility]  INVASIVE CV LAB;  Service: Cardiovascular;  Laterality: N/A;   CARDIAC CATHETERIZATION N/A 05/23/2015   Procedure: Coronary Stent Intervention;  Surgeon: Lennette Bihari, MD;  Location: [facility] INVASIVE CV LAB;  Service: Cardiovascular;  Laterality: N/A;   CARDIAC CATHETERIZATION  01-04-2004   dr Melburn Popper  @[facility]    severe 3V CAD, normal LVF   CATARACT EXTRACTION W/ INTRAOCULAR LENS  IMPLANT, BILATERAL     CORONARY ARTERY BYPASS GRAFT  01-08-2004  dr hendrickson @[facility]    LIMA to LAD,  SVG to D1,  SVG to OM,  SVG to PDA   FOOT SURGERY Right    INGUINAL HERNIA REPAIR Bilateral 1978   MASS EXCISION Left 11/20/2019   Procedure: EXCISION OF SEBACEOUS CYST  CHEST;  Surgeon: Sheliah Hatch De Blanch, MD;  Location: [facility] [place];  Service: General;  Laterality: Left;     reports that he has never smoked. He has never used smokeless tobacco. He reports that he does not drink alcohol and does not use drugs.  Allergies  Allergen Reactions   Aceon [Perindopril Erbumine] Shortness Of Breath and Cough   Sulfa Antibiotics Swelling   Pork-Derived Products     muslim    Family History  Problem Relation Age of Onset   Healthy Father    Ulcers Mother    Stroke Mother     Prior to Admission medications   Medication Sig Start Date End Date Taking? Authorizing Provider  albuterol (PROVENTIL HFA;VENTOLIN HFA) 108 (90 Base) MCG/ACT inhaler Inhale 2 puffs into the lungs every 4 (four) hours as needed for wheezing or shortness of breath. 01/03/16   Charm Rings, MD  apixaban (ELIQUIS) 2.5 MG TABS tablet Take 1 tablet (2.5 mg total) by mouth 2 (two) times daily. 11/04/22   Lennette Bihari, MD  Blood Glucose Monitoring Suppl (ONE TOUCH ULTRA 2) w/Device KIT daily in the afternoon. 11/02/22   [provider]  chlorhexidine (PERIDEX) 0.12 % solution 15 mLs 2 (two) times daily. Patient not taking: Reported on 03/05/2023 10/28/22   [provider]  docusate sodium (COLACE) 100 MG capsule Take 100 mg by mouth at bedtime.    [provider]  finasteride (PROSCAR) 5 MG tablet Take 5 mg by mouth at bedtime.     [provider]  fluticasone (FLONASE) 50 MCG/ACT nasal spray Place into both nostrils. Patient not taking: Reported on 03/05/2023 01/07/22   [provider]  furosemide (LASIX) 20 MG tablet 20 MG EVERY 4 DAYS 03/06/23   Ronney Asters, NP  isosorbide mononitrate (IMDUR) 30 MG 24 hr tablet Take 1 tablet by mouth once daily 06/08/22   Lennette Bihari, MD  losartan (COZAAR) 25 MG tablet Take 1 tablet by mouth once daily 06/08/22   Lennette Bihari, MD  metFORMIN (GLUCOPHAGE) 500 MG tablet Take 500 mg by mouth 2  (two) times daily.    [provider]  mirabegron ER (MYRBETRIQ) 50 MG TB24 tablet Take 50 mg by mouth at bedtime.     [provider]  Multiple Vitamin (MULTIVITAMIN WITH MINERALS) TABS tablet Take 1 tablet by mouth daily.    [provider]  Multiple Vitamins-Minerals (PRESERVISION AREDS 2 PO) Take 1 tablet by mouth 2 (two) times daily.     [provider]  NITROSTAT 0.4 MG SL tablet DISSOLVE ONE TABLET UNDER THE TONGUE EVERY 5 MINUTES AS NEEDED FOR CHEST PAIN. 01/13/22   Lennette Bihari, MD  ONE TOUCH ULTRA TEST test strip 1 each  by Other route as needed (glucose monoring).  04/21/15   [provider]  pantoprazole (PROTONIX) 40 MG tablet Take 1 tablet (40 mg total) by mouth daily. Patient taking differently: Take 40 mg by mouth daily. 05/21/15   Tereso Newcomer T, PA-C  PFIZER COVID-19 VAC BIVALENT injection  10/22/21   [provider]  pravastatin (PRAVACHOL) 80 MG tablet Take 80 mg by mouth at bedtime.    [provider]  predniSONE (DELTASONE) 10 MG tablet Take 10 mg by mouth. 09/21/22   [provider]    Physical Exam: Vitals:   05/10/23 1200 05/10/23 1203 05/10/23 1245 05/10/23 1330  BP: 133/84  101/67 124/72  Pulse: (!) 111  (!) 112 99  Resp: (!) 26  12 15   Temp:  98.2 F (36.8 C)    TempSrc:  Oral    SpO2: 99%  99% 99%    Constitutional: NAD, calm, comfortable Vitals:   05/10/23 1200 05/10/23 1203 05/10/23 1245 05/10/23 1330  BP: 133/84  101/67 124/72  Pulse: (!) 111  (!) 112 99  Resp: (!) 26  12 15   Temp:  98.2 F (36.8 C)    TempSrc:  Oral    SpO2: 99%  99% 99%   Eyes: PERRL, lids and conjunctivae normal ENMT: Mucous membranes are moist. Posterior pharynx clear of any exudate or lesions.Normal dentition.  Neck: normal, supple, no masses, no thyromegaly Respiratory: clear to auscultation bilaterally, no wheezing, fine crackles on B/L lower fields. Increasing respiratory effort. No accessory muscle  use.  Cardiovascular: Regular rate and rhythm, no murmurs / rubs / gallops. No extremity edema. 2+ pedal pulses. No carotid bruits.  Abdomen: Hernia sac 5 x 10 cm in the right lower quadrant, tender to touch, positive bowel sounds within the hernia sac, no masses palpated. No hepatosplenomegaly. Bowel sounds positive.  Musculoskeletal: no clubbing / cyanosis. No joint deformity upper and lower extremities. Good ROM, no contractures. Normal muscle tone.  Skin: no rashes, lesions, ulcers. No induration Neurologic: CN 2-12 grossly intact. Sensation intact, DTR normal. Strength 5/5 in all 4.  Psychiatric: Normal judgment and insight. Alert and oriented x 3. Normal mood.     Labs on Admission: I have personally reviewed following labs and imaging studies  CBC: Recent Labs  Lab 05/10/23 0918  WBC 9.2  NEUTROABS 8.1*  HGB 9.7*  HCT 31.7*  MCV 63.5*  PLT 220   Basic Metabolic Panel: Recent Labs  Lab 05/10/23 0918  NA 138  K 4.6  CL 98  CO2 21*  GLUCOSE 194*  BUN 23  CREATININE 0.81  CALCIUM 9.1   GFR: CrCl cannot be calculated (Unknown ideal weight.). Liver Function Tests: Recent Labs  Lab 05/10/23 0918  AST 30  ALT 14  ALKPHOS 38  BILITOT 1.2  PROT 6.7  ALBUMIN 3.9   Recent Labs  Lab 05/10/23 0918  LIPASE 28   No results for input(s): "AMMONIA" in the last 168 hours. Coagulation Profile: No results for input(s): "INR", "PROTIME" in the last 168 hours. Cardiac Enzymes: No results for input(s): "CKTOTAL", "CKMB", "CKMBINDEX", "TROPONINI" in the last 168 hours. BNP (last 3 results) No results for input(s): "PROBNP" in the last 8760 hours. HbA1C: No results for input(s): "HGBA1C" in the last 72 hours. CBG: No results for input(s): "GLUCAP" in the last 168 hours. Lipid Profile: No results for input(s): "CHOL", "HDL", "LDLCALC", "TRIG", "CHOLHDL", "LDLDIRECT" in the last 72 hours. Thyroid Function Tests: No results for input(s): "TSH", "T4TOTAL", "FREET4",  "T3FREE", "  THYROIDAB" in the last 72 hours. Anemia Panel: No results for input(s): "VITAMINB12", "FOLATE", "FERRITIN", "TIBC", "IRON", "RETICCTPCT" in the last 72 hours. Urine analysis:    Component Value Date/Time   COLORURINE AMBER (A) 05/10/2023 1305   APPEARANCEUR CLEAR 05/10/2023 1305   LABSPEC >1.046 (H) 05/10/2023 1305   PHURINE 5.0 05/10/2023 1305   GLUCOSEU NEGATIVE 05/10/2023 1305   HGBUR NEGATIVE 05/10/2023 1305   BILIRUBINUR NEGATIVE 05/10/2023 1305   KETONESUR 5 (A) 05/10/2023 1305   PROTEINUR 30 (A) 05/10/2023 1305   NITRITE NEGATIVE 05/10/2023 1305   LEUKOCYTESUR NEGATIVE 05/10/2023 1305    Radiological Exams on Admission: CT ABDOMEN PELVIS W CONTRAST  Result Date: 05/10/2023 CLINICAL DATA:  Right inguinal hernia, epigastric pain EXAM: CT ABDOMEN AND PELVIS WITH CONTRAST TECHNIQUE: Multidetector CT imaging of the abdomen and pelvis was performed using the standard protocol following bolus administration of intravenous contrast. RADIATION DOSE REDUCTION: This exam was performed according to the departmental dose-optimization program which includes automated exposure control, adjustment of the mA and/or kV according to patient size and/or use of iterative reconstruction technique. CONTRAST:  75mL OMNIPAQUE IOHEXOL 350 MG/ML SOLN COMPARISON:  CT chest 07/22/2015 FINDINGS: Lower chest: Small pleural effusions, new since previous, with atelectasis posteriorly at the lung bases. Post median sternotomy. Coronary and aortic atheromatous calcifications. Mitral annulus calcifications. Hepatobiliary: No focal liver abnormality is seen. No gallstones, gallbladder wall thickening, or biliary dilatation. Pancreas: Unremarkable. No pancreatic ductal dilatation or surrounding inflammatory changes. Spleen: Normal in size without focal abnormality. Adrenals/Urinary Tract: She no adrenal mass. Symmetric renal parenchymal enhancement. Multiple cortical lesions in both kidneys, some of which can be  characterized as cysts, largest 2.1 cm 5 HU right upper pole; no followup recommended. No evident urolithiasis or hydronephrosis. Urinary bladder physiologically distended. Stomach/Bowel: Stomach is decompressed. Duodenum is nondistended, unremarkable. Proximal small bowel is decompressed. There are multiple dilated mid small bowel loops. A loop of small bowel is involved in right inguinal hernia, and this appears to be a transition point to decompressed distal small bowel. The colon is incompletely distended, with a few scattered diverticula distally. Vascular/Lymphatic: Aortoiliac calcified atheromatous plaque. Portal vein patent. No abdominal or pelvic adenopathy. Reproductive: Prostate enlargement with central coarse calcifications. Other: Small volume scattered abdominal and pelvic ascites. Fluid extends into bilateral inguinal hernias. Musculoskeletal: Lumbar levoscoliosis with multilevel spondylitic change. Heterogenous mineralization of the bones. No fracture or or discrete lesion. Sternotomy wires. IMPRESSION: 1. Small-bowel obstruction secondary to right inguinal hernia. 2. Small volume ascites. 3. Small pleural effusions. 4.  Aortic Atherosclerosis (ICD10-I70.0). Electronically Signed   By: Corlis Leak M.D.   On: 05/10/2023 11:29   DG Chest Portable 1 View  Result Date: 05/10/2023 CLINICAL DATA:  Cough and chest pain. EXAM: PORTABLE CHEST 1 VIEW COMPARISON:  April 09, 2018 FINDINGS: Enlarged cardiac silhouette. Stable postsurgical changes from CABG. Chronic elevation of the left hemidiaphragm. Hazy airspace opacities throughout both lungs may represent hypoventilatory changes,, mixed pattern pulmonary edema or atypical/viral pneumonia. IMPRESSION: 1. Hazy airspace opacities throughout both lungs may represent hypoventilatory changes, mixed pattern pulmonary edema or atypical/viral pneumonia. 2. Enlarged cardiac silhouette. Electronically Signed   By: Ted Mcalpine M.D.   On: 05/10/2023 10:57     EKG: Independently reviewed.  A-fib with RVR, chronic LBBB  Assessment/Plan Principal Problem:   SBO (small bowel obstruction) (HCC) Active Problems:   Chronic systolic CHF (congestive heart failure) (HCC)   CAD (coronary artery disease) of bypass graft   Inguinal hernia of right side with obstruction  (  please populate well all problems here in Problem List. (For example, if patient is on BP meds at home and you resume or decide to hold them, it is a problem that needs to be her. Same for CAD, COPD, HLD and so on)  SBO Recurrent right inguinal hernia -Will hold off further manual reduction at this point, given the fast recurrence after the first reduction earlier in the ED. discussed with surgical PA, who request cardiology evaluation to optimize patient CHF and A-fib condition. -Symptomatic management, will consider NG tube but will discuss this with surgical team. -Symptomatic management n.p.o., IV fluid, IV Dilaudid for pain control and Zofran for nauseous vomiting -Prognosis guarded, as expected patient is a poor surgical candidate and chances of hernia incarceration, bowel necrosis is high.  Long discussion with patient and his family at bedside, patient desires Full Code.  A-fib with RVR -As there are signs of CHF decompensation, will give 1 dose of IV digoxin -As needed Lopressor for breakthrough heart rate -Hold off Eliquis -Start DVT prophylaxis with Lovenox subcu  Acute on chronic HFrEF decompensation -Secondary to uncontrolled A-fib -Patient is n.p.o. right now -Hold off IV fluid.  Control A-fib as above -There is no hypoxia right now, will hold up Lasix today  CAD CABG -No chest pains, troponin negative x 2 -No acute concerns  IIDM -SSI for now  DVT prophylaxis: Lovenox Code Status: Full code Family Communication: Daughter and granddaughter at bedside Disposition Plan: Patient is sick with obstructed right inguinal hernia and small bowel obstruction, rapid  A-fib, requiring inpatient surgical consultation and close monitoring, expect more than 2 midnight hospital stay Consults called: General surgery paged Admission status: PCU admit   Emeline General MD [facility] Pager [phone]  05/10/2023, 2:30 PM

## 2023-05-10 NOTE — ED Provider Notes (Signed)
[place] EMERGENCY DEPARTMENT AT [facility] Provider Note   CSN: [id number] Arrival date & time: 05/10/23  0849     History  Chief Complaint  Patient presents with   Hernia   Chest Pain    Dakota [name] is a 89 y.o. male.  HPI 89 year old male with a history of hypertension, moderate COPD, left bundle branch block, CABG x 4, cognitive impairment, DM type II, first-degree heart block, combined systolic and diastolic heart failure an EF of 35 to 40%  as of June 2022 , mild pulmonary HTN. who presents to the ER with complaints of right inguinal hernia and epigastric pain.  He has a known inguinal hernia that has been evaluated surgery, and was told that there were no interventions that could be offered.  He he states that the hernia has been out over the last several days, granddaughter states that it did briefly reduce yesterday but as soon as the patient starts walking it came back out again.  He also has had some worsening epigastric pain which started yesterday.  No nausea or vomiting.  He does have some brownish-reddish tinge sputum.  No known fevers or chills.  Last bowel movement was yesterday and normal.  No history of atrial fibrillation per chart review.  Wife and granddaughter at bedside states that he has been slowly losing appetite and has not been eating and drinking well.    Home Medications Prior to Admission medications   Medication Sig Start Date End Date Taking? Authorizing Provider  albuterol (PROVENTIL HFA;VENTOLIN HFA) 108 (90 Base) MCG/ACT inhaler Inhale 2 puffs into the lungs every 4 (four) hours as needed for wheezing or shortness of breath. 01/03/16   Charm Rings, MD  apixaban (ELIQUIS) 2.5 MG TABS tablet Take 1 tablet (2.5 mg total) by mouth 2 (two) times daily. 11/04/22   Lennette Bihari, MD  Blood Glucose Monitoring Suppl (ONE TOUCH ULTRA 2) w/Device KIT daily in the afternoon. 11/02/22   [provider]  chlorhexidine (PERIDEX) 0.12 % solution  15 mLs 2 (two) times daily. Patient not taking: Reported on 03/05/2023 10/28/22   [provider]  docusate sodium (COLACE) 100 MG capsule Take 100 mg by mouth at bedtime.    [provider]  finasteride (PROSCAR) 5 MG tablet Take 5 mg by mouth at bedtime.     [provider]  fluticasone (FLONASE) 50 MCG/ACT nasal spray Place into both nostrils. Patient not taking: Reported on 03/05/2023 01/07/22   [provider]  furosemide (LASIX) 20 MG tablet 20 MG EVERY 4 DAYS 03/06/23   Ronney Asters, NP  isosorbide mononitrate (IMDUR) 30 MG 24 hr tablet Take 1 tablet by mouth once daily 06/08/22   Lennette Bihari, MD  losartan (COZAAR) 25 MG tablet Take 1 tablet by mouth once daily 06/08/22   Lennette Bihari, MD  metFORMIN (GLUCOPHAGE) 500 MG tablet Take 500 mg by mouth 2 (two) times daily.    [provider]  mirabegron ER (MYRBETRIQ) 50 MG TB24 tablet Take 50 mg by mouth at bedtime.     [provider]  Multiple Vitamin (MULTIVITAMIN WITH MINERALS) TABS tablet Take 1 tablet by mouth daily.    [provider]  Multiple Vitamins-Minerals (PRESERVISION AREDS 2 PO) Take 1 tablet by mouth 2 (two) times daily.     [provider]  NITROSTAT 0.4 MG SL tablet DISSOLVE ONE TABLET UNDER THE TONGUE EVERY 5 MINUTES AS NEEDED FOR CHEST PAIN. 01/13/22  Lennette Bihari, MD  ONE TOUCH ULTRA TEST test strip 1 each by Other route as needed (glucose monoring).  04/21/15   [provider]  pantoprazole (PROTONIX) 40 MG tablet Take 1 tablet (40 mg total) by mouth daily. Patient taking differently: Take 40 mg by mouth daily. 05/21/15   Tereso Newcomer T, PA-C  PFIZER COVID-19 VAC BIVALENT injection  10/22/21   [provider]  pravastatin (PRAVACHOL) 80 MG tablet Take 80 mg by mouth at bedtime.    [provider]  predniSONE (DELTASONE) 10 MG tablet Take 10 mg by mouth. 09/21/22   [provider]      Allergies    Aceon  [perindopril erbumine], Sulfa antibiotics, and Pork-derived products    Review of Systems   Review of Systems Ten systems reviewed and are negative for acute change, except as noted in the HPI.   Physical Exam Updated Vital Signs BP 101/67   Pulse (!) 112   Temp 98.2 F (36.8 C) (Oral)   Resp 12   SpO2 99%  Physical Exam Vitals and nursing note reviewed.  Constitutional:      General: He is not in acute distress.    Appearance: He is well-developed.     Comments: Mucous membranes dry.  HENT:     Head: Normocephalic and atraumatic.  Eyes:     Conjunctiva/sclera: Conjunctivae normal.  Cardiovascular:     Rate and Rhythm: Tachycardia present. Rhythm irregular.     Heart sounds: No murmur heard.    Comments: A-fib RVR with heart rate in the 130s Pulmonary:     Effort: Pulmonary effort is normal. No respiratory distress.     Breath sounds: Normal breath sounds.  Abdominal:     Palpations: Abdomen is soft.     Tenderness: There is no abdominal tenderness.     Comments: Large right inguinal hernia, not easily reducible and mildly tender to palpation.  No significant right upper quadrant or epigastric tenderness.  Musculoskeletal:        General: No swelling.     Cervical back: Neck supple.     Right lower leg: No edema.     Left lower leg: No edema.  Skin:    General: Skin is warm and dry.     Capillary Refill: Capillary refill takes less than 2 seconds.  Neurological:     Mental Status: He is alert.  Psychiatric:        Mood and Affect: Mood normal.     ED Results / Procedures / Treatments   Labs (all labs ordered are listed, but only abnormal results are displayed) Labs Reviewed  CBC WITH DIFFERENTIAL/PLATELET - Abnormal; Notable for the following components:      Result Value   Hemoglobin 9.7 (*)    HCT 31.7 (*)    MCV 63.5 (*)    MCH 19.4 (*)    RDW 16.2 (*)    Neutro Abs 8.1 (*)    nRBC 1 (*)    All other components within normal limits  COMPREHENSIVE  METABOLIC PANEL - Abnormal; Notable for the following components:   CO2 21 (*)    Glucose, Bld 194 (*)    Anion gap 19 (*)    All other components within normal limits  BRAIN NATRIURETIC PEPTIDE - Abnormal; Notable for the following components:   B Natriuretic Peptide 1,170.3 (*)    All other components within normal limits  RESP PANEL BY RT-PCR (RSV, FLU A&B, COVID)  RVPGX2  LIPASE, BLOOD  LACTIC ACID, PLASMA  URINALYSIS, ROUTINE W REFLEX MICROSCOPIC  LACTIC ACID, PLASMA  PATHOLOGIST SMEAR REVIEW  TROPONIN I (HIGH SENSITIVITY)  TROPONIN I (HIGH SENSITIVITY)    EKG EKG Interpretation Date/Time:  Monday May 10 2023 09:08:29 EDT Ventricular Rate:  129 PR Interval:    QRS Duration:  133 QT Interval:  366 QTC Calculation: 537 R Axis:   174  Text Interpretation: Atrial fibrillation with rapid ventricular response  LBBB similar to prior Confirmed by Vivi Barrack [phone]) on 05/10/2023 9:16:07 AM  Radiology CT ABDOMEN PELVIS W CONTRAST  Result Date: 05/10/2023 CLINICAL DATA:  Right inguinal hernia, epigastric pain EXAM: CT ABDOMEN AND PELVIS WITH CONTRAST TECHNIQUE: Multidetector CT imaging of the abdomen and pelvis was performed using the standard protocol following bolus administration of intravenous contrast. RADIATION DOSE REDUCTION: This exam was performed according to the departmental dose-optimization program which includes automated exposure control, adjustment of the mA and/or kV according to patient size and/or use of iterative reconstruction technique. CONTRAST:  75mL OMNIPAQUE IOHEXOL 350 MG/ML SOLN COMPARISON:  CT chest 07/22/2015 FINDINGS: Lower chest: Small pleural effusions, new since previous, with atelectasis posteriorly at the lung bases. Post median sternotomy. Coronary and aortic atheromatous calcifications. Mitral annulus calcifications. Hepatobiliary: No focal liver abnormality is seen. No gallstones, gallbladder wall thickening, or biliary dilatation. Pancreas:  Unremarkable. No pancreatic ductal dilatation or surrounding inflammatory changes. Spleen: Normal in size without focal abnormality. Adrenals/Urinary Tract: She no adrenal mass. Symmetric renal parenchymal enhancement. Multiple cortical lesions in both kidneys, some of which can be characterized as cysts, largest 2.1 cm 5 HU right upper pole; no followup recommended. No evident urolithiasis or hydronephrosis. Urinary bladder physiologically distended. Stomach/Bowel: Stomach is decompressed. Duodenum is nondistended, unremarkable. Proximal small bowel is decompressed. There are multiple dilated mid small bowel loops. A loop of small bowel is involved in right inguinal hernia, and this appears to be a transition point to decompressed distal small bowel. The colon is incompletely distended, with a few scattered diverticula distally. Vascular/Lymphatic: Aortoiliac calcified atheromatous plaque. Portal vein patent. No abdominal or pelvic adenopathy. Reproductive: Prostate enlargement with central coarse calcifications. Other: Small volume scattered abdominal and pelvic ascites. Fluid extends into bilateral inguinal hernias. Musculoskeletal: Lumbar levoscoliosis with multilevel spondylitic change. Heterogenous mineralization of the bones. No fracture or or discrete lesion. Sternotomy wires. IMPRESSION: 1. Small-bowel obstruction secondary to right inguinal hernia. 2. Small volume ascites. 3. Small pleural effusions. 4.  Aortic Atherosclerosis (ICD10-I70.0). Electronically Signed   By: Corlis Leak M.D.   On: 05/10/2023 11:29   DG Chest Portable 1 View  Result Date: 05/10/2023 CLINICAL DATA:  Cough and chest pain. EXAM: PORTABLE CHEST 1 VIEW COMPARISON:  April 09, 2018 FINDINGS: Enlarged cardiac silhouette. Stable postsurgical changes from CABG. Chronic elevation of the left hemidiaphragm. Hazy airspace opacities throughout both lungs may represent hypoventilatory changes,, mixed pattern pulmonary edema or atypical/viral  pneumonia. IMPRESSION: 1. Hazy airspace opacities throughout both lungs may represent hypoventilatory changes, mixed pattern pulmonary edema or atypical/viral pneumonia. 2. Enlarged cardiac silhouette. Electronically Signed   By: Ted Mcalpine M.D.   On: 05/10/2023 10:57    Procedures Procedures    Medications Ordered in ED Medications  ondansetron (ZOFRAN) injection 4 mg (4 mg Intravenous Patient Refused/Not Given 05/10/23 1205)  lactated ringers bolus 500 mL (0 mLs Intravenous Stopped 05/10/23 1131)  fentaNYL (SUBLIMAZE) injection 25 mcg (25 mcg Intravenous Given 05/10/23 0954)  iohexol (OMNIPAQUE) 350 MG/ML injection 75 mL (75 mLs Intravenous Contrast Given 05/10/23 1115)  fentaNYL (SUBLIMAZE) injection 25 mcg (25 mcg Intravenous Given 05/10/23 1207)  lactated ringers bolus 500 mL (500 mLs Intravenous New Bag/Given 05/10/23 1320)    ED Course/ Medical Decision Making/ A&P                             Medical Decision Making Amount and/or Complexity of Data Reviewed Labs: ordered. Radiology: ordered.  Risk Prescription drug management.  89 year old male presenting with right inguinal hernia, nausea and epigastric pain.  Patient presenting with tachycardia in the 120s/130s, EKG consistent with atrial fibrillation, no prior history of this.  He is anticoagulated on Eliquis.  He is otherwise hemodynamically stable.  On exam he has a large right inguinal hernia which I initially attempted to reduce but without success.  Mild generalized abdominal tenderness.  Exam concerning for incarceration/possible hernia strangulation, other differentials considered includes ACS, GERD, cholecystitis, pneumonia  Labs ordered, reviewed, CBC without leukocytosis, hemoglobin 9.7, slightly down from baseline.  CMP without any significant electrode abnormalities, though with a anion gap acidosis.  Lipase is normal.  Initial troponin is negative.  Lactic acid normal, though drawn after fluid resuscitation.   Consider negative.  BNP elevated at 1170   Chest x-ray ordered, reviewed, agree with radiology read, notable for hazy opacities throughout both lung may be hypoventilatory changes, pulmonary edema versus atypical/viral pneumonia.  CT of the abdomen pelvis ordered, evidence of small bowel obstruction secondary to inguinal hernia.  Attempted again to reduce hernia, this time successfully.  He received 500 cc of fluid but still remains tachycardic in the teens/120s.  He was given fentanyl for pain.  He does endorse a cough, he has a questionable pneumonia on chest x-ray but otherwise no fever or leukocytosis.  However given persistent tachycardia/atrial fibrillation, suspect patient will need admission for fluid rehydration and ensuring that his small bowel obstruction resolves.  Consulted hospitalist for admission.  This was a shared visit with my supervising physician Dr. Jearld Fenton who independently saw and evaluated the patient & provided guidance in evaluation/management/disposition ,in agreement with care   Final Clinical Impression(s) / ED Diagnoses Final diagnoses:  Right inguinal hernia  Atrial fibrillation with rapid ventricular response (HCC)  Increased anion gap metabolic acidosis  Cough, unspecified type    Rx / DC Orders ED Discharge Orders     None         Mare Ferrari, PA-C 05/10/23 1403    Loetta Rough, MD 05/13/23 1708

## 2023-05-10 NOTE — Consult Note (Signed)
Cardiology Consultation   Patient ID: [name] MRN: [id number]; DOB: 29-Jul-1926  Admit date: 05/10/2023 Date of Consult: 05/10/2023  PCP:  [name], MD   [facility] Providers Cardiologist:  Nicki Guadalajara, MD        Patient Profile:   Dakota [name] is a 89 y.o. male with a hx of left bundle branch block, atrial fibrillation, combined systolic and diastolic CHF, CAD (s/p CABG in 2005 with LIMA to LAD, SVG to diagonal, SVG to obtuse marginal, and SVG to distal RCA, PCI 2016), essential hypertension, moderate COPD, DM type II who is being seen 05/10/2023 for the evaluation of CHF and afib in the setting of SBO at the request of [facility].  History of Present Illness:   [name] presented to the ED today for evaluation of severe abdominal pain and non-reducible chronic right inguinal hernia. Patient has had a chronic but self-reducible hernia over the past several years, previously determined that surgical risk outweighed benefit. On 7/14, patient developed abdominal pain and he found that his hernia was not able to be reduced. Subsequently he developed more severe abdominal cramping and has not had flatulence or bowel movement since. In the ED, patient found with afib and RVR. HGB 9.7, WBC 9.2, BNP 1170.3, lactic acid 1.1. CT abd/pelvis with evidence of SBO secondary to right inguinal hernia. Given RVR and concern of acute CHF with possible abd surgery needed, cardiology consulted.  On exam, patient appears overall comfortable. He is not having as much abdominal pain after receiving fentanyl pushes. Patient reports that he has been doing quite well until developing abdominal pain yesterday. He has family visiting and has been more active in the past week. Denies any recent chest pain, shortness of breath, palpitations. He has orthopedic frailty but continues to be active and can walk shorter distances/climb 4-5 steps at a time without angina or dyspnea.     Past Medical History:  Diagnosis Date   Aortic atherosclerosis (HCC)    Arthritis    Benign localized prostatic hyperplasia with lower urinary tract symptoms (LUTS)    Chronic chest pain    takes imdur   Chronic combined systolic (congestive) and diastolic (congestive) heart failure (HCC)    followed by cardiology   Chronic dyspnea    Cognitive impairment    COPD, moderate (HCC)    Edema of both lower extremities    takes lasix and uses teds   First degree heart block    GERD (gastroesophageal reflux disease)    Heart murmur    History of cardiovascular stress test    Lexiscan Myoview 7/16:  EF 51%, inferior, inferolateral ischemia; Intermediate Risk   History of smallpox    HOH (hard of hearing)    even with hearing aids   Hypertension    Ischemic heart disease 2005   Dr Tresa Endo a. s/p CABG;  b.  LHC 05/23/15:  S-RPAVB ok, S-D2 ok, S-OM2 99% (s/p Synergy DES), L-LAD ok, EF normal (complicated by pseudoaneurysm)    LBBB (left bundle branch block)    Right arm weakness    S/P CABG x 4    01-08-2004   S/P drug eluting coronary stent placement    05-21-2015  PCI and DES x1 to SVG--OM graft   Thalassemia minor    Type 2 diabetes, diet controlled (HCC)    Wears glasses    Wears hearing aid in both ears     Past Surgical History:  Procedure  Laterality Date   APPENDECTOMY     CARDIAC CATHETERIZATION  1989   in [place]   normal     CARDIAC CATHETERIZATION N/A 05/23/2015   Procedure: Left Heart Cath and Cors/Grafts Angiography;  Surgeon: Lennette Bihari, MD;  Location: [facility] INVASIVE CV [facility];  Service: Cardiovascular;  Laterality: N/A;   CARDIAC CATHETERIZATION N/A 05/23/2015   Procedure: Coronary Stent Intervention;  Surgeon: Lennette Bihari, MD;  Location: [facility]VAS[facility] CV [facility];  Service: Cardiovascular;  Laterality: N/A;   CARDIAC CATHETERIZATION  01-04-2004   dr Melburn Popper  @[facility]    severe 3V CAD, normal LVF   CATARACT EXTRACTION W/ INTRAOCULAR LENS  IMPLANT, BILATERAL     CORONARY  ARTERY BYPASS GRAFT  01-08-2004  dr hendrickson @[facility]    LIMA to LAD,  SVG to D1,  SVG to OM,  SVG to PDA   FOOT SURGERY Right    INGUINAL HERNIA REPAIR Bilateral 1978   MASS EXCISION Left 11/20/2019   Procedure: EXCISION OF SEBACEOUS CYST CHEST;  Surgeon: Sheliah Hatch De Blanch, MD;  Location: [facility];  Service: General;  Laterality: Left;     Home Medications:  Prior to Admission medications   Medication Sig Start Date End Date Taking? Authorizing Provider  albuterol (PROVENTIL HFA;VENTOLIN HFA) 108 (90 Base) MCG/ACT inhaler Inhale 2 puffs into the lungs every 4 (four) hours as needed for wheezing or shortness of breath. 01/03/16  Yes Charm Rings, MD  apixaban (ELIQUIS) 2.5 MG TABS tablet Take 1 tablet (2.5 mg total) by mouth 2 (two) times daily. 11/04/22  Yes Lennette Bihari, MD  docusate sodium (COLACE) 100 MG capsule Take 100 mg by mouth at bedtime.   Yes [provider]  finasteride (PROSCAR) 5 MG tablet Take 5 mg by mouth at bedtime.    Yes [provider]  isosorbide mononitrate (IMDUR) 30 MG 24 hr tablet Take 1 tablet by mouth once daily 06/08/22  Yes Lennette Bihari, MD  ketorolac (ACULAR) 0.5 % ophthalmic solution Place 1 drop into the left eye 4 (four) times daily.   Yes [provider]  losartan (COZAAR) 25 MG tablet Take 1 tablet by mouth once daily 06/08/22  Yes Lennette Bihari, MD  metFORMIN (GLUCOPHAGE) 500 MG tablet Take 500 mg by mouth 2 (two) times daily.   Yes [provider]  mirabegron ER (MYRBETRIQ) 50 MG TB24 tablet Take 50 mg by mouth at bedtime.    Yes [provider]  Multiple Vitamin (MULTIVITAMIN WITH MINERALS) TABS tablet Take 1 tablet by mouth daily.   Yes [provider]  Multiple Vitamins-Minerals (PRESERVISION AREDS 2 PO) Take 1 tablet by mouth 2 (two) times daily.    Yes [provider]  NITROSTAT 0.4 MG SL tablet DISSOLVE ONE TABLET UNDER THE TONGUE EVERY 5 MINUTES AS NEEDED FOR CHEST  PAIN. Patient taking differently: Place 0.4 mg under the tongue every 5 (five) minutes as needed for chest pain. 01/13/22  Yes Lennette Bihari, MD  pravastatin (PRAVACHOL) 80 MG tablet Take 80 mg by mouth at bedtime.   Yes [provider]  prednisoLONE acetate (PRED FORTE) 1 % ophthalmic suspension Place 1 drop into the left eye 4 (four) times daily.   Yes [provider]  Blood Glucose Monitoring Suppl (ONE TOUCH ULTRA 2) w/Device KIT daily in the afternoon. 11/02/22   [provider]  chlorhexidine (PERIDEX) 0.12 % solution 15 mLs 2 (two) times daily. Patient not taking: Reported on 03/05/2023 10/28/22  [provider]  furosemide (LASIX) 20 MG tablet 20 MG EVERY 4 DAYS Patient taking differently: Take 20 mg by mouth See admin instructions. 20 MG EVERY 4 DAYS 03/06/23   Ronney Asters, NP  ONE TOUCH ULTRA TEST test strip 1 each by Other route as needed (glucose monoring).  04/21/15   [provider]  pantoprazole (PROTONIX) 40 MG tablet Take 1 tablet (40 mg total) by mouth daily. Patient taking differently: Take 40 mg by mouth daily. 05/21/15   Tereso Newcomer T, PA-C  predniSONE (DELTASONE) 10 MG tablet Take 10 mg by mouth. Patient not taking: Reported on 05/10/2023 09/21/22   [provider]    Inpatient Medications: Scheduled Meds:  digoxin  0.125 mg Intravenous Once   enoxaparin (LOVENOX) injection  30 mg Subcutaneous Q24H   insulin aspart  0-9 Units Subcutaneous TID WC   ondansetron (ZOFRAN) IV  4 mg Intravenous Once   Continuous Infusions:  PRN Meds: albuterol, HYDROmorphone (DILAUDID) injection, metoprolol tartrate, ondansetron **OR** ondansetron (ZOFRAN) IV  Allergies:    Allergies  Allergen Reactions   Aceon [Perindopril Erbumine] Shortness Of Breath and Cough   Sulfa Antibiotics Swelling   Pork-Derived Products     muslim    Social History:   Social History   Socioeconomic History   Marital status: Married    Spouse  name: Not on file   Number of children: 2 d   Years of education: Not on file   Highest education level: Not on file  Occupational History   Occupation: accountant    Comment: [employer]  Tobacco Use   Smoking status: Never   Smokeless tobacco: Never  Vaping Use   Vaping status: Never Used  Substance and Sexual Activity   Alcohol use: No    Alcohol/week: 0.0 standard drinks of alcohol   Drug use: No   Sexual activity: Not on file  Other Topics Concern   Not on file  Social History Narrative   Not on file   Social Determinants of Health   Financial Resource Strain: Not on file  Food Insecurity: Not on file  Transportation Needs: Not on file  Physical Activity: Not on file  Stress: Not on file  Social Connections: Not on file  Intimate Partner Violence: Not on file    Family History:    Family History  Problem Relation Age of Onset   Healthy Father    Ulcers Mother    Stroke Mother      ROS:  Please see the history of present illness.   All other ROS reviewed and negative.     Physical Exam/Data:   Vitals:   05/10/23 1203 05/10/23 1245 05/10/23 1330 05/10/23 1500  BP:  101/67 124/72 118/67  Pulse:  (!) 112 99 99  Resp:  12 15 (!) 28  Temp: 98.2 F (36.8 C)     TempSrc: Oral     SpO2:  99% 99% 98%    Intake/Output Summary (Last 24 hours) at 05/10/2023 1608 Last data filed at 05/10/2023 1504 Gross per 24 hour  Intake 1000 ml  Output --  Net 1000 ml      03/05/2023    1:34 PM 11/25/2022   10:55 AM 08/26/2022   12:04 PM  Last 3 Weights  Weight (lbs) 108 lb 3.2 oz 106 lb 11.2 oz 105 lb 9.6 oz  Weight (kg) 49.079 kg 48.399 kg 47.9 kg     There is no height or weight on file to calculate BMI.  General: Frail appearing. Well nourished, well developed, in no acute distress HEENT: normal Neck: patient with what appears to be CV wave Vascular: No carotid bruits; Distal pulses 2+ bilaterally Cardiac:  normal S1, S2; irregularly irregular; faint systolic  murmur at apex. Lungs:  clear to auscultation bilaterally, no wheezing, rhonchi or rales  Abd: soft, nontender, no hepatomegaly  Ext: no edema Musculoskeletal:  No deformities, BUE and BLE strength normal and equal Skin: warm and dry  Neuro:  CNs 2-12 intact, no focal abnormalities noted Psych:  Normal affect   EKG:  The EKG was personally reviewed and demonstrates:  atrial fibrillation with RVR, ventricular rate ~130bpm Telemetry:  Telemetry was personally reviewed and demonstrates:  patient in persistent afib with down-trending ventricular rates since receiving pain relief.  Relevant CV Studies:  04/14/21 TTE  IMPRESSIONS     1. Left ventricular ejection fraction, by estimation, is 35 to 40%. The  left ventricle has moderately decreased function. The left ventricle  demonstrates global hypokinesis. Left ventricular diastolic function could  not be evaluated.   2. Right ventricular systolic function is moderately reduced. The right  ventricular size is mildly enlarged. There is mildly elevated pulmonary  artery systolic pressure.   3. Left atrial size was severely dilated.   4. Right atrial size was moderately dilated.   5. The mitral valve is abnormal. Mild mitral valve regurgitation. Severe  mitral annular calcification.   6. Tricuspid valve regurgitation is moderate.   7. The aortic valve is calcified. There is severe calcifcation of the  aortic valve. Aortic valve regurgitation is not visualized. Mild to  moderate aortic valve sclerosis/calcification is present, without any  evidence of aortic stenosis.   8. The inferior vena cava is normal in size with <50% respiratory  variability, suggesting right atrial pressure of 8 mmHg.   Comparison(s): Prior images reviewed side by side. Changes from prior  study are noted. EF reduced compared to prior study.   Conclusion(s)/Recommendation(s): EF reduced on current study compared to  prior, with significant dyssynchrony. Both  mitral and aortic valves are  heavily calcified with restricted mobility. However, neither has  significantly elevated gradients.   FINDINGS   Left Ventricle: Left ventricular ejection fraction, by estimation, is 35  to 40%. The left ventricle has moderately decreased function. The left  ventricle demonstrates global hypokinesis. The left ventricular internal  cavity size was normal in size.  There is no left ventricular hypertrophy. Abnormal (paradoxical) septal  motion, consistent with left bundle branch block. Left ventricular  diastolic function could not be evaluated due to atrial fibrillation. Left  ventricular diastolic function could not   be evaluated.   Right Ventricle: The right ventricular size is mildly enlarged. Right  vetricular wall thickness was not well visualized. Right ventricular  systolic function is moderately reduced. There is mildly elevated  pulmonary artery systolic pressure. The tricuspid   regurgitant velocity is 3.00 m/s, and with an assumed right atrial  pressure of 8 mmHg, the estimated right ventricular systolic pressure is  44.0 mmHg.   Left Atrium: Left atrial size was severely dilated.   Right Atrium: Right atrial size was moderately dilated.   Pericardium: There is no evidence of pericardial effusion.   Mitral Valve: The mitral valve is abnormal. There is moderate thickening  of the mitral valve leaflet(s). There is moderate calcification of the  mitral valve leaflet(s). Severe mitral annular calcification. Mild mitral  valve regurgitation. MV peak  gradient, 4.2 mmHg. The mean mitral valve  gradient is 1.5 mmHg with  average heart rate of 78 bpm.   Tricuspid Valve: The tricuspid valve is normal in structure. Tricuspid  valve regurgitation is moderate . No evidence of tricuspid stenosis.   Aortic Valve: The aortic valve is calcified. There is severe calcifcation  of the aortic valve. There is severe aortic valve annular calcification.   Aortic valve regurgitation is not visualized. Mild to moderate aortic  valve sclerosis/calcification is  present, without any evidence of aortic stenosis. Aortic valve mean  gradient measures 3.5 mmHg. Aortic valve peak gradient measures 7.3 mmHg.  Aortic valve area, by VTI measures 1.36 cm.   Pulmonic Valve: The pulmonic valve was not well visualized. Pulmonic valve  regurgitation is mild. No evidence of pulmonic stenosis.   Aorta: The aortic root, ascending aorta and aortic arch are all  structurally normal, with no evidence of dilitation or obstruction.   Venous: The inferior vena cava is normal in size with less than 50%  respiratory variability, suggesting right atrial pressure of 8 mmHg.   IAS/Shunts: The atrial septum is grossly normal.   Laboratory Data:  High Sensitivity Troponin:   Recent Labs  Lab 05/10/23 0918 05/10/23 1203  TROPONINIHS 7 9     Chemistry Recent Labs  Lab 05/10/23 0918  NA 138  K 4.6  CL 98  CO2 21*  GLUCOSE 194*  BUN 23  CREATININE 0.81  CALCIUM 9.1  GFRNONAA >60  ANIONGAP 19*    Recent Labs  Lab 05/10/23 0918  PROT 6.7  ALBUMIN 3.9  AST 30  ALT 14  ALKPHOS 38  BILITOT 1.2   Lipids No results for input(s): "CHOL", "TRIG", "HDL", "LABVLDL", "LDLCALC", "CHOLHDL" in the last 168 hours.  Hematology Recent Labs  Lab 05/10/23 0918  WBC 9.2  RBC 4.99  HGB 9.7*  HCT 31.7*  MCV 63.5*  MCH 19.4*  MCHC 30.6  RDW 16.2*  PLT 220   Thyroid No results for input(s): "TSH", "FREET4" in the last 168 hours.  BNP Recent Labs  Lab 05/10/23 1100  BNP 1,170.3*    DDimer No results for input(s): "DDIMER" in the last 168 hours.   Radiology/Studies:  CT ABDOMEN PELVIS W CONTRAST  Result Date: 05/10/2023 CLINICAL DATA:  Right inguinal hernia, epigastric pain EXAM: CT ABDOMEN AND PELVIS WITH CONTRAST TECHNIQUE: Multidetector CT imaging of the abdomen and pelvis was performed using the standard protocol following bolus administration  of intravenous contrast. RADIATION DOSE REDUCTION: This exam was performed according to the departmental dose-optimization program which includes automated exposure control, adjustment of the mA and/or kV according to patient size and/or use of iterative reconstruction technique. CONTRAST:  75mL OMNIPAQUE IOHEXOL 350 MG/ML SOLN COMPARISON:  CT chest 07/22/2015 FINDINGS: Lower chest: Small pleural effusions, new since previous, with atelectasis posteriorly at the lung bases. Post median sternotomy. Coronary and aortic atheromatous calcifications. Mitral annulus calcifications. Hepatobiliary: No focal liver abnormality is seen. No gallstones, gallbladder wall thickening, or biliary dilatation. Pancreas: Unremarkable. No pancreatic ductal dilatation or surrounding inflammatory changes. Spleen: Normal in size without focal abnormality. Adrenals/Urinary Tract: She no adrenal mass. Symmetric renal parenchymal enhancement. Multiple cortical lesions in both kidneys, some of which can be characterized as cysts, largest 2.1 cm 5 HU right upper pole; no followup recommended. No evident urolithiasis or hydronephrosis. Urinary bladder physiologically distended. Stomach/Bowel: Stomach is decompressed. Duodenum is nondistended, unremarkable. Proximal small bowel is decompressed. There are multiple dilated mid small bowel loops. A loop of small bowel is involved in right inguinal  hernia, and this appears to be a transition point to decompressed distal small bowel. The colon is incompletely distended, with a few scattered diverticula distally. Vascular/Lymphatic: Aortoiliac calcified atheromatous plaque. Portal vein patent. No abdominal or pelvic adenopathy. Reproductive: Prostate enlargement with central coarse calcifications. Other: Small volume scattered abdominal and pelvic ascites. Fluid extends into bilateral inguinal hernias. Musculoskeletal: Lumbar levoscoliosis with multilevel spondylitic change. Heterogenous mineralization  of the bones. No fracture or or discrete lesion. Sternotomy wires. IMPRESSION: 1. Small-bowel obstruction secondary to right inguinal hernia. 2. Small volume ascites. 3. Small pleural effusions. 4.  Aortic Atherosclerosis (ICD10-I70.0). Electronically Signed   By: Corlis Leak M.D.   On: 05/10/2023 11:29   DG Chest Portable 1 View  Result Date: 05/10/2023 CLINICAL DATA:  Cough and chest pain. EXAM: PORTABLE CHEST 1 VIEW COMPARISON:  April 09, 2018 FINDINGS: Enlarged cardiac silhouette. Stable postsurgical changes from CABG. Chronic elevation of the left hemidiaphragm. Hazy airspace opacities throughout both lungs may represent hypoventilatory changes,, mixed pattern pulmonary edema or atypical/viral pneumonia. IMPRESSION: 1. Hazy airspace opacities throughout both lungs may represent hypoventilatory changes, mixed pattern pulmonary edema or atypical/viral pneumonia. 2. Enlarged cardiac silhouette. Electronically Signed   By: Ted Mcalpine M.D.   On: 05/10/2023 10:57     Assessment and Plan:   Acute on chronic HFrEF  Patient's last echocardiogram in June 2022 showed LVEF 35-40% with global hypokinesis. This admission found with BNP 1170.3. No prior BNP available for comparison.   NYHA class I per description of symptoms.  Clinically he appears euvolemic and is warm and well perfused. No significant JVP noted (though does have CV wave). Suspect that elevated BNP primarily a result of longstanding afib with previously noted significant bi-atrial dilation.  Given NPO status, would hold home Lasix. Losartan will also need to be held with NPO status. Resume as able this admission.  TTE is pending, cardiology will follow.   Persistent atrial fibrillation CHA2DS2-VASc Score = 6   Patient admitted with recurrent right inguinal hernia that has now developed in to SBO and found to be in afib with RVR. PTA, patient on Eliquis 2.5mg  BID without need for rate control agent.   Rates have improved with  pain management. Suspect RVR physiological stress response to SBO/pain. If further rate control needed in peri-operative period, would utilize beta blockers if BP allows and Amiodarone if patient hypotensive.  Patient will need anti-coagulation bridging in peri-operative period with CHA2DS2-VASc Score = 6.  CAD  Patient without recent anginal symptoms. Last LHC in 2016 with PCI of SVG to OM.   Has been on ASA and Eliquis. Given age/frailty, could consider stopping ASA as patient will need to continue with Eliquis 2.5mg  BID (weight<60kg, age>80) due to afib.  Unable to continue imdur due to NPO status with SBO  Recurrent right inguinal hernia with SBO  Patient presented with recurrent non-reducible right hernia and abdominal pain (hernia first noted 4 years ago). Previously has been reducible until 2 days ago. Initially reduced in the ED but recurred. CT abd/pelvis shows SBO with hernia. [facility] have seen patient and "recommend surgical repair to prevent strangulation once eliquis worn off and medically optimized."   From a cardiovascular disease perspective, patient will be at higher risk of complications with abdominal surgery regardless of optimizations. Risk of major cardiac events in perioperative period is 11%. Patient unable to achieve >4 METs but given stable cardiac symptoms, do not feel that stress test is necessary. Echocardiogram has been ordered by patient's  primary team and we will follow.   Risk Assessment/Risk Scores:        New York Heart Association (NYHA) Functional Class NYHA Class I  CHA2DS2-VASc Score = 6   This indicates a 9.7% annual risk of stroke. The patient's score is based upon: CHF History: 1 HTN History: 1 Diabetes History: 1 Stroke History: 0 Vascular Disease History: 1 Age Score: 2 Gender Score: 0         For questions or updates, please contact [facility] Please consult [email or web address] for contact info under     Signed, Perlie Gold, PA-C  05/10/2023 4:08 PM

## 2023-05-10 NOTE — Consult Note (Signed)
Consult Note  Dakota [name] 11/11/1925  [id number].    Requesting MD: Mikey College, MD Chief Complaint/Reason for Consult: RIH with SBO  HPI:  Patient is a 89 year old male who presented to the ED with known RIH that was not reducible and decreased bowel function. Patient diagnosed with RIH 3-4 years ago but reportedly told by a surgeon that he was not a candidate for repair. Day before yesterday the hernia came out and would not reduce spontaneously. EDP was able to tempraroly reduce here. Last BM was yesterday. Patients granddaughter is at the bedside, says his pain was 8/10 when he got to the ED but he now denies any pain. No flatus since yesterday evening. PMH otherwise significant for Chronic combined CHF, COPD, CAD s/p CABG and DES, PAF on eliquis, Thalassemia minor, T2DM, BPH, GERD, HTN and HOH. Last dose of eliquis 7/14 PM. Prior abdominal surgery includes bilateral inguinal hernia repair and appendectomy.    ROS: Negative other than HPI  Family History  Problem Relation Age of Onset   Healthy Father    Ulcers Mother    Stroke Mother     Past Medical History:  Diagnosis Date   Aortic atherosclerosis (HCC)    Arthritis    Benign localized prostatic hyperplasia with lower urinary tract symptoms (LUTS)    Chronic chest pain    takes imdur   Chronic combined systolic (congestive) and diastolic (congestive) heart failure (HCC)    followed by cardiology   Chronic dyspnea    Cognitive impairment    COPD, moderate (HCC)    Edema of both lower extremities    takes lasix and uses teds   First degree heart block    GERD (gastroesophageal reflux disease)    Heart murmur    History of cardiovascular stress test    Lexiscan Myoview 7/16:  EF 51%, inferior, inferolateral ischemia; Intermediate Risk   History of smallpox    HOH (hard of hearing)    even with hearing aids   Hypertension    Ischemic heart disease 2005   Dr Tresa Endo a. s/p CABG;  b.  LHC 05/23/15:  S-RPAVB ok,  S-D2 ok, S-OM2 99% (s/p Synergy DES), L-LAD ok, EF normal (complicated by pseudoaneurysm)    LBBB (left bundle branch block)    Right arm weakness    S/P CABG x 4    01-08-2004   S/P drug eluting coronary stent placement    05-21-2015  PCI and DES x1 to SVG--OM graft   Thalassemia minor    Type 2 diabetes, diet controlled (HCC)    Wears glasses    Wears hearing aid in both ears     Past Surgical History:  Procedure Laterality Date   APPENDECTOMY     CARDIAC CATHETERIZATION  1989   in [place]   normal     CARDIAC CATHETERIZATION N/A 05/23/2015   Procedure: Left Heart Cath and Cors/Grafts Angiography;  Surgeon: Lennette Bihari, MD;  Location: [facility];  Service: Cardiovascular;  Laterality: N/A;   CARDIAC CATHETERIZATION N/A 05/23/2015   Procedure: Coronary Stent Intervention;  Surgeon: Lennette Bihari, MD;  Location: [facility];  Service: Cardiovascular;  Laterality: N/A;   CARDIAC CATHETERIZATION  01-04-2004   dr Melburn Popper  @[facility]    severe 3V CAD, normal LVF   CATARACT EXTRACTION W/ INTRAOCULAR LENS  IMPLANT, BILATERAL     CORONARY ARTERY BYPASS GRAFT  01-08-2004  dr hendrickson @[facility]   LIMA to LAD,  SVG to D1,  SVG to OM,  SVG to PDA   FOOT SURGERY Right    INGUINAL HERNIA REPAIR Bilateral 1978   MASS EXCISION Left 11/20/2019   Procedure: EXCISION OF SEBACEOUS CYST CHEST;  Surgeon: Sheliah Hatch, De Blanch, MD;  Location: [facility] [place];  Service: General;  Laterality: Left;    Social History:  reports that he has never smoked. He has never used smokeless tobacco. He reports that he does not drink alcohol and does not use drugs.  Allergies:  Allergies  Allergen Reactions   Aceon [Perindopril Erbumine] Shortness Of Breath and Cough   Sulfa Antibiotics Swelling   Pork-Derived Products     muslim    (Not in a hospital admission)   Blood pressure 124/72, pulse 99, temperature 98.2 F (36.8 C), temperature source Oral, resp. rate 15, SpO2 99%. Physical  Exam:  General: pleasant, WD,  elderly male who appears chronically ill, in NAD HEENT: head is normocephalic, atraumatic.  Sclera are noninjected.  PERRL.  Ears and nose without any masses or lesions.  Mouth is pink and moist Heart: irregularly irregular, HR 120-123 bpm during my exam, no lower extremity edema  Lungs: Respiratory effort nonlabored ORA Abd: soft, NT, minimal distention, right inguinal hernia is easily visible, it is soft and temporarily reduces easily before come back out.  MS: all 4 extremities are symmetrical with no cyanosis, clubbing, or edema. There is diffuse muscle wasting Skin: warm and dry with no masses, lesions, or rashes Neuro: Cranial nerves 2-12 grossly intact, sensation is normal throughout Psych: A&Ox3 with an appropriate affect.   Results for orders placed or performed during the hospital encounter of 05/10/23 (from the past 48 hour(s))  CBC with Differential     Status: Abnormal   Collection Time: 05/10/23  9:18 AM  Result Value Ref Range   WBC 9.2 4.0 - 10.5 K/uL   RBC 4.99 4.22 - 5.81 MIL/uL   Hemoglobin 9.7 (L) 13.0 - 17.0 g/dL   HCT 16.1 (L) 09.6 - 04.5 %   MCV 63.5 (L) 80.0 - 100.0 fL   MCH 19.4 (L) 26.0 - 34.0 pg   MCHC 30.6 30.0 - 36.0 g/dL   RDW 40.9 (H) 81.1 - 91.4 %   Platelets 220 150 - 400 K/uL    Comment: REPEATED TO VERIFY   nRBC 0.0 0.0 - 0.2 %   Neutrophils Relative % 88 %   Neutro Abs 8.1 (H) 1.7 - 7.7 K/uL   Lymphocytes Relative 9 %   Lymphs Abs 0.8 0.7 - 4.0 K/uL   Monocytes Relative 3 %   Monocytes Absolute 0.3 0.1 - 1.0 K/uL   Eosinophils Relative 0 %   Eosinophils Absolute 0.0 0.0 - 0.5 K/uL   Basophils Relative 0 %   Basophils Absolute 0.0 0.0 - 0.1 K/uL   nRBC 1 (H) 0 /100 WBC   Abs Immature Granulocytes 0.00 0.00 - 0.07 K/uL   Schistocytes PRESENT    Polychromasia PRESENT    Ovalocytes PRESENT     Comment: Performed at [facility], [address] [place], [place] [address]  Comprehensive metabolic panel      Status: Abnormal   Collection Time: 05/10/23  9:18 AM  Result Value Ref Range   Sodium 138 135 - 145 mmol/L   Potassium 4.6 3.5 - 5.1 mmol/L   Chloride 98 98 - 111 mmol/L   CO2 21 (L) 22 - 32 mmol/L   Glucose, Bld 194 (  H) 70 - 99 mg/dL    Comment: Glucose reference range applies only to samples taken after fasting for at least 8 hours.   BUN 23 8 - 23 mg/dL   Creatinine, Ser 9.60 0.61 - 1.24 mg/dL   Calcium 9.1 8.9 - 45.4 mg/dL   Total Protein 6.7 6.5 - 8.1 g/dL   Albumin 3.9 3.5 - 5.0 g/dL   AST 30 15 - 41 U/L   ALT 14 0 - 44 U/L   Alkaline Phosphatase 38 38 - 126 U/L   Total Bilirubin 1.2 0.3 - 1.2 mg/dL   GFR, Estimated >09 >81 mL/min    Comment: (NOTE) Calculated using the CKD-EPI Creatinine Equation (2021)    Anion gap 19 (H) 5 - 15    Comment: Performed at [facility], [address], [place], [place] [address]  Lipase, blood     Status: None   Collection Time: 05/10/23  9:18 AM  Result Value Ref Range   Lipase 28 11 - 51 U/L    Comment: Performed at [facility], [address], [place], [place] [address]  Troponin I (High Sensitivity)     Status: None   Collection Time: 05/10/23  9:18 AM  Result Value Ref Range   Troponin I (High Sensitivity) 7 <18 ng/L    Comment: (NOTE) Elevated high sensitivity troponin I (hsTnI) values and significant  changes across serial measurements may suggest ACS but many other  chronic and acute conditions are known to elevate hsTnI results.  Refer to the "Links" section for chest pain algorithms and additional  guidance. Performed at [facility], [address] [place], [place] [address]   Brain natriuretic peptide     Status: Abnormal   Collection Time: 05/10/23 11:00 AM  Result Value Ref Range   B Natriuretic Peptide 1,170.3 (H) 0.0 - 100.0 pg/mL    Comment: Performed at [facility], [address] [place], [place] [address]  Resp panel by RT-PCR (RSV, Flu A&B, Covid) Anterior Nasal Swab     Status:  None   Collection Time: 05/10/23 11:11 AM   Specimen: Anterior Nasal Swab  Result Value Ref Range   SARS Coronavirus 2 by RT PCR NEGATIVE NEGATIVE   Influenza A by PCR NEGATIVE NEGATIVE   Influenza B by PCR NEGATIVE NEGATIVE    Comment: (NOTE) The Xpert Xpress SARS-CoV-2/FLU/RSV plus assay is intended as an aid in the diagnosis of influenza from Nasopharyngeal swab specimens and should not be used as a sole basis for treatment. Nasal washings and aspirates are unacceptable for Xpert Xpress SARS-CoV-2/FLU/RSV testing.  Fact Sheet for Patients: BloggerCourse.com  Fact Sheet for Healthcare Providers: SeriousBroker.it  This test is not yet approved or cleared by the [place] FDA and has been authorized for detection and/or diagnosis of SARS-CoV-2 by FDA under an Emergency Use Authorization (EUA). This EUA will remain in effect (meaning this test can be used) for the duration of the COVID-19 declaration under Section 564(b)(1) of the Act, 21 U.S.C. section 360bbb-3(b)(1), unless the authorization is terminated or revoked.     Resp Syncytial Virus by PCR NEGATIVE NEGATIVE    Comment: (NOTE) Fact Sheet for Patients: BloggerCourse.com  Fact Sheet for Healthcare Providers: SeriousBroker.it  This test is not yet approved or cleared by the [place] FDA and has been authorized for detection and/or diagnosis of SARS-CoV-2 by FDA under an Emergency Use Authorization (EUA). This EUA will remain in effect (meaning this test  can be used) for the duration of the COVID-19 declaration under Section 564(b)(1) of the Act, 21 U.S.C. section 360bbb-3(b)(1), unless the authorization is terminated or revoked.  Performed at [facility], [address], [place], [place] [address]   Troponin I (High Sensitivity)     Status: None   Collection Time: 05/10/23 12:03 PM  Result Value Ref  Range   Troponin I (High Sensitivity) 9 <18 ng/L    Comment: (NOTE) Elevated high sensitivity troponin I (hsTnI) values and significant  changes across serial measurements may suggest ACS but many other  chronic and acute conditions are known to elevate hsTnI results.  Refer to the "Links" section for chest pain algorithms and additional  guidance. Performed at [facility], [address] [place], [place] [address]   Lactic acid, plasma     Status: None   Collection Time: 05/10/23 12:39 PM  Result Value Ref Range   Lactic Acid, Venous 1.1 0.5 - 1.9 mmol/L    Comment: Performed at [facility], [address] [place], [place] [address]  Urinalysis, Routine w reflex microscopic -Urine, Clean Catch     Status: Abnormal   Collection Time: 05/10/23  1:05 PM  Result Value Ref Range   Color, Urine AMBER (A) YELLOW    Comment: BIOCHEMICALS MAY BE AFFECTED BY COLOR   APPearance CLEAR CLEAR   Specific Gravity, Urine >1.046 (H) 1.005 - 1.030   pH 5.0 5.0 - 8.0   Glucose, UA NEGATIVE NEGATIVE mg/dL   Hgb urine dipstick NEGATIVE NEGATIVE   Bilirubin Urine NEGATIVE NEGATIVE   Ketones, ur 5 (A) NEGATIVE mg/dL   Protein, ur 30 (A) NEGATIVE mg/dL   Nitrite NEGATIVE NEGATIVE   Leukocytes,Ua NEGATIVE NEGATIVE   RBC / HPF 6-10 0 - 5 RBC/hpf   WBC, UA 0-5 0 - 5 WBC/hpf   Bacteria, UA RARE (A) NONE SEEN   Squamous Epithelial / HPF 0-5 0 - 5 /HPF   Mucus PRESENT    Ca Oxalate Crys, UA PRESENT     Comment: Performed at [facility], [address], [place], [place] [address]   CT ABDOMEN PELVIS W CONTRAST  Result Date: 05/10/2023 CLINICAL DATA:  Right inguinal hernia, epigastric pain EXAM: CT ABDOMEN AND PELVIS WITH CONTRAST TECHNIQUE: Multidetector CT imaging of the abdomen and pelvis was performed using the standard protocol following bolus administration of intravenous contrast. RADIATION DOSE REDUCTION: This exam was performed according to the departmental dose-optimization  program which includes automated exposure control, adjustment of the mA and/or kV according to patient size and/or use of iterative reconstruction technique. CONTRAST:  75mL OMNIPAQUE IOHEXOL 350 MG/ML SOLN COMPARISON:  CT chest 07/22/2015 FINDINGS: Lower chest: Small pleural effusions, new since previous, with atelectasis posteriorly at the lung bases. Post median sternotomy. Coronary and aortic atheromatous calcifications. Mitral annulus calcifications. Hepatobiliary: No focal liver abnormality is seen. No gallstones, gallbladder wall thickening, or biliary dilatation. Pancreas: Unremarkable. No pancreatic ductal dilatation or surrounding inflammatory changes. Spleen: Normal in size without focal abnormality. Adrenals/Urinary Tract: She no adrenal mass. Symmetric renal parenchymal enhancement. Multiple cortical lesions in both kidneys, some of which can be characterized as cysts, largest 2.1 cm 5 HU right upper pole; no followup recommended. No evident urolithiasis or hydronephrosis. Urinary bladder physiologically distended. Stomach/Bowel: Stomach is decompressed. Duodenum is nondistended, unremarkable. Proximal small bowel is decompressed. There are multiple dilated mid small bowel loops. A loop of small bowel is involved in right inguinal hernia, and  this appears to be a transition point to decompressed distal small bowel. The colon is incompletely distended, with a few scattered diverticula distally. Vascular/Lymphatic: Aortoiliac calcified atheromatous plaque. Portal vein patent. No abdominal or pelvic adenopathy. Reproductive: Prostate enlargement with central coarse calcifications. Other: Small volume scattered abdominal and pelvic ascites. Fluid extends into bilateral inguinal hernias. Musculoskeletal: Lumbar levoscoliosis with multilevel spondylitic change. Heterogenous mineralization of the bones. No fracture or or discrete lesion. Sternotomy wires. IMPRESSION: 1. Small-bowel obstruction secondary to  right inguinal hernia. 2. Small volume ascites. 3. Small pleural effusions. 4.  Aortic Atherosclerosis (ICD10-I70.0). Electronically Signed   By: Corlis Leak M.D.   On: 05/10/2023 11:29   DG Chest Portable 1 View  Result Date: 05/10/2023 CLINICAL DATA:  Cough and chest pain. EXAM: PORTABLE CHEST 1 VIEW COMPARISON:  April 09, 2018 FINDINGS: Enlarged cardiac silhouette. Stable postsurgical changes from CABG. Chronic elevation of the left hemidiaphragm. Hazy airspace opacities throughout both lungs may represent hypoventilatory changes,, mixed pattern pulmonary edema or atypical/viral pneumonia. IMPRESSION: 1. Hazy airspace opacities throughout both lungs may represent hypoventilatory changes, mixed pattern pulmonary edema or atypical/viral pneumonia. 2. Enlarged cardiac silhouette. Electronically Signed   By: Ted Mcalpine M.D.   On: 05/10/2023 10:57      Assessment/Plan SBO secondary to [facility] - CT with above and transition at level of RIH - no leukocytosis or lactic acidosis, Afebrile, currently in afib with RVR - Hernia temporarily reduces - easily slides in and our during my exam. Typically would recommend surgical repair to prevent recurrent SBO or risk of bowel strangulation. Hernia is soft and temporarily reduces, no signs of strangulation or ischemia on exam. No role for emergent surgery today. Recommend cardiac evaluation for perioperative risk stratification. Will need to weight the risk vs benefit of hernia repair. Hold eliquis. No role for NG tube currently. If he gets worsening abdominal distention or develops nausea/voimiting then would order NG tube.  FEN: NPO, IVF, sips with meds ok. Ice chips ok. VTE: hold eliquis ID: none  - per primary -  Chronic combined CHF COPD CAD s/p CABG and DES PAF on eliquis Thalassemia minor T2DM BPH GERD HTN HOH   I reviewed nursing notes, ED provider notes, hospitalist notes, last 24 h vitals and pain scores, last 48 h intake and output,  last 24 h labs and trends, and last 24 h imaging results.   Hosie Spangle, [facility] 05/10/2023, 3:19 PM Please see Amion for pager number during day hours 7:00am-4:30pm

## 2023-05-10 NOTE — Progress Notes (Signed)
Patient has been taking amoxicillin for URI for the last 3 days, will continue for another 2 days.

## 2023-05-10 NOTE — ED Triage Notes (Signed)
Patient's family reports R inguinal hernia present x 2-3 years. Patient has seen surgeon and has been told they can do nothing about it. Patient also reports epigastric pain since 0400 today and spitting up some brown-red phlegm today.

## 2023-05-10 NOTE — TOC Initial Note (Signed)
Transition of Care [facility]) - Initial/Assessment Note    Patient Details  Name: Dakota [name] MRN: [id number] Date of Birth: September 23, 1926  [facility]) CM/SW Contact:    Mearl Latin, LCSW Phone Number: 05/10/2023, 4:45 PM  Clinical Narrative:                 Patient admitted from home with support from his daughter. Surgery following for medical management of SBO. TOC will continue to follow patient and if needs arise please place consult.     Barriers to Discharge: Continued Medical Work up   Patient Goals and CMS Choice            Expected Discharge Plan and Services       Living arrangements for the past 2 months: Apartment                                      Prior Living Arrangements/Services Living arrangements for the past 2 months: Apartment   Patient language and need for interpreter reviewed:: Yes        Need for Family Participation in Patient Care: Yes (Comment) Care giver support system in place?: Yes (comment)   Criminal Activity/Legal Involvement Pertinent to Current Situation/Hospitalization: No - Comment as needed  Activities of Daily Living      Permission Sought/Granted                  Emotional Assessment Appearance:: Appears stated age     Orientation: : Oriented to Self, Oriented to Place, Oriented to Situation Alcohol / Substance Use: Not Applicable Psych Involvement: No (comment)  Admission diagnosis:  SBO (small bowel obstruction) (HCC) [K56.609] Right inguinal hernia [K40.90] Atrial fibrillation with rapid ventricular response (HCC) [I48.91] Increased anion gap metabolic acidosis [E87.29] Cough, unspecified type [R05.9] Patient Active Problem List   Diagnosis Date Noted   SBO (small bowel obstruction) (HCC) 05/10/2023   Inguinal hernia of right side with obstruction 05/10/2023   Cardiac arrhythmia 11/26/2019   Chest pain 04/10/2018   Nonspecific chest pain    Essential hypertension 09/07/2016    COPD, moderate (HCC) 06/13/2015   CAD (coronary artery disease) of bypass graft 05/23/2015   Coronary artery disease involving native coronary artery of native heart with angina pectoris (HCC)    Abnormal nuclear stress test    Ischemic chest pain (HCC)    Cough 09/26/2012   Malaise and fatigue 04/11/2012   Hx of CABG 04/03/2011   Left bundle branch block 04/03/2011   Diabetes (HCC) 04/03/2011   Hypercholesterolemia 04/03/2011   Chronic systolic CHF (congestive heart failure) (HCC) 04/03/2011   PCP:  [name], MD Pharmacy:   [facility] [address] [place] - [address] [place] [address] Phone: [phone] Fax: [phone]     Social Determinants of Health (SDOH) Social History: SDOH Screenings   Tobacco Use: Low Risk  (03/05/2023)   SDOH Interventions:     Readmission Risk Interventions     No data to display

## 2023-05-11 ENCOUNTER — Inpatient Hospital Stay (HOSPITAL_COMMUNITY): Payer: Medicare HMO

## 2023-05-11 DIAGNOSIS — K56609 Unspecified intestinal obstruction, unspecified as to partial versus complete obstruction: Secondary | ICD-10-CM

## 2023-05-11 DIAGNOSIS — I5023 Acute on chronic systolic (congestive) heart failure: Secondary | ICD-10-CM

## 2023-05-11 LAB — ECHOCARDIOGRAM COMPLETE
AR max vel: 1.66 cm2
AV Area VTI: 1.98 cm2
AV Area mean vel: 1.67 cm2
AV Mean grad: 5 mmHg
AV Peak grad: 9.2 mmHg
Ao pk vel: 1.52 m/s
S' Lateral: 3 cm
Weight: 1731.93 oz

## 2023-05-11 LAB — BASIC METABOLIC PANEL
Anion gap: 8 (ref 5–15)
BUN: 16 mg/dL (ref 8–23)
CO2: 24 mmol/L (ref 22–32)
Calcium: 8.2 mg/dL — ABNORMAL LOW (ref 8.9–10.3)
Chloride: 105 mmol/L (ref 98–111)
Creatinine, Ser: 0.63 mg/dL (ref 0.61–1.24)
GFR, Estimated: >60 mL/min (ref >60)
Glucose, Bld: 104 mg/dL — ABNORMAL HIGH (ref 70–99)
Potassium: 3.8 mmol/L (ref 3.5–5.1)
Sodium: 137 mmol/L (ref 135–145)

## 2023-05-11 LAB — GLUCOSE, CAPILLARY
Glucose-Capillary: 94 mg/dL (ref 70–99)
Glucose-Capillary: 104 mg/dL — ABNORMAL HIGH (ref 70–99)
Glucose-Capillary: 164 mg/dL — ABNORMAL HIGH (ref 70–99)
Glucose-Capillary: 157 mg/dL — ABNORMAL HIGH (ref 70–99)

## 2023-05-11 LAB — TYPE AND SCREEN
ABO/RH(D): B POS
Antibody Screen: NEGATIVE

## 2023-05-11 LAB — BRAIN NATRIURETIC PEPTIDE: B Natriuretic Peptide: 824.9 pg/mL — ABNORMAL HIGH (ref 0.0–100.0)

## 2023-05-11 LAB — CBC
HCT: 27.9 % — ABNORMAL LOW (ref 39.0–52.0)
Hemoglobin: 8.6 g/dL — ABNORMAL LOW (ref 13.0–17.0)
MCH: 19.2 pg — ABNORMAL LOW (ref 26.0–34.0)
MCHC: 30.8 g/dL (ref 30.0–36.0)
MCV: 62.1 fL — ABNORMAL LOW (ref 80.0–100.0)
Platelets: 196 10*3/uL (ref 150–400)
RBC: 4.49 MIL/uL (ref 4.22–5.81)
RDW: 15.9 % — ABNORMAL HIGH (ref 11.5–15.5)
WBC: 6.5 10*3/uL (ref 4.0–10.5)
nRBC: 0 % (ref 0.0–0.2)

## 2023-05-11 LAB — ABO/RH: ABO/RH(D): B POS

## 2023-05-11 LAB — PROCALCITONIN: Procalcitonin: <0.1 ng/mL

## 2023-05-11 LAB — C-REACTIVE PROTEIN: CRP: 0.6 mg/dL (ref <1.0)

## 2023-05-11 LAB — TSH: TSH: 0.922 u[IU]/mL (ref 0.350–4.500)

## 2023-05-11 MED ORDER — PANTOPRAZOLE SODIUM 40 MG IV SOLR
40.0000 mg | INTRAVENOUS | Status: DC
  Administered 2023-05-11 – 2023-05-14 (×3): 40 mg via INTRAVENOUS
  Filled 2023-05-11 (×3): qty 10

## 2023-05-11 MED ORDER — ENOXAPARIN SODIUM 60 MG/0.6ML IJ SOSY
50.0000 mg | PREFILLED_SYRINGE | Freq: Once | INTRAMUSCULAR | Status: AC
  Administered 2023-05-11: 50 mg via SUBCUTANEOUS
  Filled 2023-05-11: qty 0.6

## 2023-05-11 MED ORDER — FINASTERIDE 5 MG PO TABS
5.0000 mg | ORAL_TABLET | Freq: Every day | ORAL | Status: DC
  Administered 2023-05-11 – 2023-05-16 (×6): 5 mg via ORAL
  Filled 2023-05-11 (×6): qty 1

## 2023-05-11 MED ORDER — METOPROLOL TARTRATE 25 MG PO TABS
25.0000 mg | ORAL_TABLET | Freq: Two times a day (BID) | ORAL | Status: DC
  Administered 2023-05-11 – 2023-05-12 (×4): 25 mg via ORAL
  Filled 2023-05-11 (×4): qty 1

## 2023-05-11 MED ORDER — PRAVASTATIN SODIUM 40 MG PO TABS
80.0000 mg | ORAL_TABLET | Freq: Every day | ORAL | Status: DC
  Administered 2023-05-11 – 2023-05-16 (×6): 80 mg via ORAL
  Filled 2023-05-11 (×6): qty 2

## 2023-05-11 MED ORDER — MIRABEGRON ER 50 MG PO TB24
50.0000 mg | ORAL_TABLET | Freq: Every day | ORAL | Status: DC
  Administered 2023-05-11 – 2023-05-16 (×6): 50 mg via ORAL
  Filled 2023-05-11 (×8): qty 1

## 2023-05-11 MED ORDER — ISOSORBIDE MONONITRATE ER 30 MG PO TB24
30.0000 mg | ORAL_TABLET | Freq: Every day | ORAL | Status: DC
  Administered 2023-05-11 – 2023-05-12 (×2): 30 mg via ORAL
  Filled 2023-05-11 (×2): qty 1

## 2023-05-11 MED ORDER — PREDNISOLONE ACETATE 1 % OP SUSP
1.0000 [drp] | Freq: Four times a day (QID) | OPHTHALMIC | Status: DC
  Administered 2023-05-11 – 2023-05-17 (×24): 1 [drp] via OPHTHALMIC
  Filled 2023-05-11: qty 5

## 2023-05-11 MED ORDER — POTASSIUM CL IN DEXTROSE 5% 20 MEQ/L IV SOLN
20.0000 meq | INTRAVENOUS | Status: DC
  Administered 2023-05-12: 20 meq via INTRAVENOUS
  Filled 2023-05-11 (×2): qty 1000

## 2023-05-11 MED ORDER — POTASSIUM CL IN DEXTROSE 5% 20 MEQ/L IV SOLN
20.0000 meq | INTRAVENOUS | Status: DC
  Administered 2023-05-11: 20 meq via INTRAVENOUS
  Filled 2023-05-11: qty 1000

## 2023-05-11 MED ORDER — NITROGLYCERIN 0.4 MG SL SUBL: 0.4000 mg | SUBLINGUAL_TABLET | SUBLINGUAL | Status: DC | PRN

## 2023-05-11 MED ORDER — DEXAMETHASONE SODIUM PHOSPHATE 4 MG/ML IJ SOLN
4.0000 mg | INTRAMUSCULAR | Status: DC
  Administered 2023-05-11 – 2023-05-12 (×2): 4 mg via INTRAVENOUS
  Filled 2023-05-11 (×3): qty 1

## 2023-05-11 NOTE — Progress Notes (Signed)
PROGRESS NOTE                                                                                                                                                                                                             Patient Demographics:    Dakota [name], is a 89 y.o. male, DOB - 14-May-1926, UEA:[id number]  Outpatient Primary MD for the patient is Tisovec, Adelfa Koh, MD    LOS - 1  Admit date - 05/10/2023    Chief Complaint  Patient presents with   Hernia   Chest Pain       Brief Narrative (HPI from H&P)   89 y.o. male with medical history significant of chronic right inguinal hernia, PAF on Eliquis, CAD status post CABG x 4, chronic HFrEF LVEF 30-50%, HTN, IIDM, presented with recurrent nonreducible right hernia and abdominal pain.    Subjective:    Dakota [name] today has, No headache, No chest pain, R. Inguinal pain, No new weakness tingling or numbness, no SOB   Assessment  & Plan :   SBO, Recurrent right inguinal hernia -  seen by CCS, currently on conservative treatment, clear liquids for now, n.p.o. after midnight except for medications on 05/12/2023, likely surgical reduction on 05/12/2023.  Type screen done, also seen by cardiology prior to surgery, moderate to high risk candidate for adverse cardiopulmonary outcome, patient and family agreeable to proceed with surgery.  Agreeable for transfusion if needed.    Paroxysmal A-fib with RVR - seen by cardiology, low-dose oral beta-blocker, heparin drip, [place] vas 2 score of greater than 6, home Eliquis on hold, received 1 units of IV digoxin in the ER, currently on oral and IV Lopressor.  Continue to monitor if rate control becomes an issue with low blood pressures we will utilize amiodarone.  Check TSH and echo.  Acute on chronic HFrEF decompensation - he is in the ER, CHF resolved, currently compensated, cardiology on board, echo pending.    Patient on chronic steroids.   Continue.  Monitor closely.    CAD CABG  -No chest pains, troponin negative x 2, No acute concerns continue statin and beta-blocker as tolerated.   IIDM  -SSI for now   CBG (last 3)  Recent Labs    05/10/23 1605 05/10/23 2133 05/11/23 0831  GLUCAP 114* 109* 94  Condition - Extremely Guarded  Family Communication  : Daughter bedside 05/11/2023  Code Status : Full code  Consults  : Cardiology, CCS  PUD Prophylaxis : PPI   Procedures  :     TTE  CT - 1. Small-bowel obstruction secondary to right inguinal hernia. 2. Small volume ascites. 3. Small pleural effusions. 4.  Aortic Atherosclerosis      Disposition Plan  :    Status is: Inpatient  DVT Prophylaxis  :    enoxaparin (LOVENOX) injection 30 mg Start: 05/10/23 2200    Lab Results  Component Value Date   PLT 196 05/11/2023    Diet :  Diet Order             Diet NPO time specified Except for: Sips with Meds  Diet effective midnight           Diet clear liquid Room service appropriate? Yes; Fluid consistency: Thin  Diet effective now                    Inpatient Medications  Scheduled Meds:  amoxicillin  500 mg Oral Q12H   dexamethasone (DECADRON) injection  4 mg Intravenous Q24H   enoxaparin (LOVENOX) injection  30 mg Subcutaneous Q24H   finasteride  5 mg Oral QHS   insulin aspart  0-9 Units Subcutaneous TID WC   isosorbide mononitrate  30 mg Oral Daily   ketorolac  1 drop Both Eyes QID   metoprolol tartrate  25 mg Oral BID   mirabegron ER  50 mg Oral QHS   ondansetron (ZOFRAN) IV  4 mg Intravenous Once   pantoprazole (PROTONIX) IV  40 mg Intravenous Q24H   pravastatin  80 mg Oral QHS   prednisoLONE acetate  1 drop Left Eye QID   Continuous Infusions:  [START ON 05/12/2023] dextrose 5 % with KCl 20 mEq / L     PRN Meds:.albuterol, HYDROmorphone (DILAUDID) injection, metoprolol tartrate, nitroGLYCERIN, ondansetron **OR** ondansetron (ZOFRAN) IV  Antibiotics  :     Anti-infectives (From admission, onward)    Start     Dose/Rate Route Frequency Ordered Stop   05/10/23 2200  amoxicillin (AMOXIL) capsule 500 mg        500 mg Oral Every 12 hours 05/10/23 1925 05/12/23 2159         Objective:   Vitals:   05/11/23 0416 05/11/23 0420 05/11/23 0700 05/11/23 0827  BP: 117/78 117/78  110/75  Pulse: (!) 107 93  97  Resp: (!) 28 20    Temp: 97.6 F (36.4 C) 97.6 F (36.4 C)  98.2 F (36.8 C)  TempSrc: Oral   Oral  SpO2: 97% 96%    Weight:   49.1 kg     Wt Readings from Last 3 Encounters:  05/11/23 49.1 kg  03/05/23 49.1 kg  11/25/22 48.4 kg     Intake/Output Summary (Last 24 hours) at 05/11/2023 0957 Last data filed at 05/11/2023 0600 Gross per 24 hour  Intake 1000 ml  Output 200 ml  Net 800 ml     Physical Exam  Awake Alert, No new F.N deficits, Normal affect [place].AT,PERRAL Supple Neck, No JVD,   Symmetrical Chest wall movement, Good air movement bilaterally, CTAB RRR,No Gallops,Rubs or new Murmurs,  +ve B.Sounds, Abd Soft, No tenderness,   No Cyanosis, Clubbing or edema        Data Review:    Recent Labs  Lab 05/10/23 0918 05/11/23 0156  WBC 9.2 6.5  HGB  9.7* 8.6*  HCT 31.7* 27.9*  PLT 220 196  MCV 63.5* 62.1*  MCH 19.4* 19.2*  MCHC 30.6 30.8  RDW 16.2* 15.9*  LYMPHSABS 0.8  --   MONOABS 0.3  --   EOSABS 0.0  --   BASOSABS 0.0  --     Recent Labs  Lab 05/10/23 0918 05/10/23 1100 05/10/23 1239 05/10/23 1634 05/11/23 0156 05/11/23 0655  NA 138  --   --   --  137  --   K 4.6  --   --   --  3.8  --   CL 98  --   --   --  105  --   CO2 21*  --   --   --  24  --   ANIONGAP 19*  --   --   --  8  --   GLUCOSE 194*  --   --   --  104*  --   BUN 23  --   --   --  16  --   CREATININE 0.81  --   --   --  0.63  --   AST 30  --   --   --   --   --   ALT 14  --   --   --   --   --   ALKPHOS 38  --   --   --   --   --   BILITOT 1.2  --   --   --   --   --   ALBUMIN 3.9  --   --   --   --   --   CRP  --   --    --   --   --  0.6  PROCALCITON  --   --   --   --   --  <0.10  LATICACIDVEN  --   --  1.1 1.0  --   --   HGBA1C  --   --   --  6.0*  --   --   BNP  --  1,170.3*  --   --   --  824.9*  MG  --   --   --  1.9  --   --   CALCIUM 9.1  --   --   --  8.2*  --       Recent Labs  Lab 05/10/23 0918 05/10/23 1100 05/10/23 1239 05/10/23 1634 05/11/23 0156 05/11/23 0655  CRP  --   --   --   --   --  0.6  PROCALCITON  --   --   --   --   --  <0.10  LATICACIDVEN  --   --  1.1 1.0  --   --   HGBA1C  --   --   --  6.0*  --   --   BNP  --  1,170.3*  --   --   --  824.9*  MG  --   --   --  1.9  --   --   CALCIUM 9.1  --   --   --  8.2*  --       Lab Results  Component Value Date   HGBA1C 6.0 (H) 05/10/2023    Radiology Reports DG Chest 1 View  Result Date: 05/11/2023 CLINICAL DATA:  Congestive heart failure. EXAM: CHEST  1 VIEW COMPARISON:  May 10, 2023. FINDINGS: Stable cardiomediastinal silhouette. Status  post coronary bypass graft. Degenerative changes are seen involving both glenohumeral joints. Minimal left midlung and bibasilar opacities are noted concerning for subsegmental atelectasis or scarring. Small left pleural effusion is noted. IMPRESSION: Minimal left mid lung and bibasilar opacities are noted concerning for subsegmental atelectasis or possibly scarring. Small left pleural effusion is noted. Electronically Signed   By: Lupita Raider M.D.   On: 05/11/2023 08:12   CT ABDOMEN PELVIS W CONTRAST  Result Date: 05/10/2023 CLINICAL DATA:  Right inguinal hernia, epigastric pain EXAM: CT ABDOMEN AND PELVIS WITH CONTRAST TECHNIQUE: Multidetector CT imaging of the abdomen and pelvis was performed using the standard protocol following bolus administration of intravenous contrast. RADIATION DOSE REDUCTION: This exam was performed according to the departmental dose-optimization program which includes automated exposure control, adjustment of the mA and/or kV according to patient size and/or  use of iterative reconstruction technique. CONTRAST:  75mL OMNIPAQUE IOHEXOL 350 MG/ML SOLN COMPARISON:  CT chest 07/22/2015 FINDINGS: Lower chest: Small pleural effusions, new since previous, with atelectasis posteriorly at the lung bases. Post median sternotomy. Coronary and aortic atheromatous calcifications. Mitral annulus calcifications. Hepatobiliary: No focal liver abnormality is seen. No gallstones, gallbladder wall thickening, or biliary dilatation. Pancreas: Unremarkable. No pancreatic ductal dilatation or surrounding inflammatory changes. Spleen: Normal in size without focal abnormality. Adrenals/Urinary Tract: She no adrenal mass. Symmetric renal parenchymal enhancement. Multiple cortical lesions in both kidneys, some of which can be characterized as cysts, largest 2.1 cm 5 HU right upper pole; no followup recommended. No evident urolithiasis or hydronephrosis. Urinary bladder physiologically distended. Stomach/Bowel: Stomach is decompressed. Duodenum is nondistended, unremarkable. Proximal small bowel is decompressed. There are multiple dilated mid small bowel loops. A loop of small bowel is involved in right inguinal hernia, and this appears to be a transition point to decompressed distal small bowel. The colon is incompletely distended, with a few scattered diverticula distally. Vascular/Lymphatic: Aortoiliac calcified atheromatous plaque. Portal vein patent. No abdominal or pelvic adenopathy. Reproductive: Prostate enlargement with central coarse calcifications. Other: Small volume scattered abdominal and pelvic ascites. Fluid extends into bilateral inguinal hernias. Musculoskeletal: Lumbar levoscoliosis with multilevel spondylitic change. Heterogenous mineralization of the bones. No fracture or or discrete lesion. Sternotomy wires. IMPRESSION: 1. Small-bowel obstruction secondary to right inguinal hernia. 2. Small volume ascites. 3. Small pleural effusions. 4.  Aortic Atherosclerosis (ICD10-I70.0).  Electronically Signed   By: Corlis Leak M.D.   On: 05/10/2023 11:29   DG Chest Portable 1 View  Result Date: 05/10/2023 CLINICAL DATA:  Cough and chest pain. EXAM: PORTABLE CHEST 1 VIEW COMPARISON:  April 09, 2018 FINDINGS: Enlarged cardiac silhouette. Stable postsurgical changes from CABG. Chronic elevation of the left hemidiaphragm. Hazy airspace opacities throughout both lungs may represent hypoventilatory changes,, mixed pattern pulmonary edema or atypical/viral pneumonia. IMPRESSION: 1. Hazy airspace opacities throughout both lungs may represent hypoventilatory changes, mixed pattern pulmonary edema or atypical/viral pneumonia. 2. Enlarged cardiac silhouette. Electronically Signed   By: Ted Mcalpine M.D.   On: 05/10/2023 10:57      Signature  -   Susa Raring M.D on 05/11/2023 at 9:57 AM   -  To page go to [email or web address]

## 2023-05-11 NOTE — Evaluation (Signed)
Occupational Therapy Evaluation Patient Details Name: Dakota [name] MRN: [id number] DOB: Nov 22, 1925 Today's Date: 05/11/2023   History of Present Illness 89 y.o. male presented 05/10/23 with recurrent nonreducible right hernia and abdominal pain. Attempting medical management.   PMH significant of chronic right inguinal hernia, PAF on Eliquis, CAD status post CABG x 4, chronic HFrEF LVEF 30-50%, HTN, IIDM,   Clinical Impression   PT admitted with nonreducible hernia and abdominal pain. Pt currently with functional limitiations due to the deficits listed below (see OT problem list). Pt at baseline uses RW and completes all adls. Pt reports standing to wash dishes when pain started. Family reports pt uses walker but is able to use just the cane to complete bathroom transfers due to door width limiting RW. OT to continue to follow with note of possible surgery.  Pt will benefit from skilled OT to increase their independence and safety with adls and balance to allow discharge HHOT.       Recommendations for follow up therapy are one component of a multi-disciplinary discharge planning process, led by the attending physician.  Recommendations may be updated based on patient status, additional functional criteria and insurance authorization.   Assistance Recommended at Discharge Set up Supervision/Assistance  Patient can return home with the following A lot of help with walking and/or transfers;A lot of help with bathing/dressing/bathroom    Functional Status Assessment  Patient has had a recent decline in their functional status and demonstrates the ability to make significant improvements in function in a reasonable and predictable amount of time.  Equipment Recommendations  Other (comment) (transport chair)    Recommendations for Other Services       Precautions / Restrictions Precautions Precautions: Fall Restrictions Weight Bearing Restrictions: No      Mobility Bed Mobility                General bed mobility comments: up in chair on arrival    Transfers Overall transfer level: Needs assistance Equipment used: Rolling walker (2 wheels) Transfers: Sit to/from Stand Sit to Stand: Min assist           General transfer comment: lifting assist to initiate sit to stand rocking momentum      Balance Overall balance assessment: Needs assistance Sitting-balance support: No upper extremity supported, Feet supported Sitting balance-Leahy Scale: Fair     Standing balance support: Bilateral upper extremity supported, Reliant on assistive device for balance Standing balance-Leahy Scale: Poor                             ADL either performed or assessed with clinical judgement   ADL Overall ADL's : Needs assistance/impaired Eating/Feeding: Set up   Grooming: Set up;Sitting   Upper Body Bathing: Set up   Lower Body Bathing: Minimal assistance           Toilet Transfer: Minimal assistance           Functional mobility during ADLs: Minimal assistance General ADL Comments: pt requires rocking momentum to complete sit<>stand and heavy use of bil hands     Vision Baseline Vision/History: 1 Wears glasses Ability to See in Adequate Light: 0 Adequate Patient Visual Report: No change from baseline Vision Assessment?: No apparent visual deficits     Perception     Praxis      Pertinent Vitals/Pain Pain Assessment Pain Assessment: Faces Faces Pain Scale: Hurts a little bit Pain Location: stomach Pain Descriptors /  Indicators: Sore Pain Intervention(s): Monitored during session, Repositioned     Hand Dominance Right   Extremity/Trunk Assessment Upper Extremity Assessment Upper Extremity Assessment: RUE deficits/detail RUE Deficits / Details: hx of shoulder injury   Lower Extremity Assessment Lower Extremity Assessment: Generalized weakness   Cervical / Trunk Assessment Cervical / Trunk Assessment: Kyphotic   Communication  Communication Communication: HOH   Cognition Arousal/Alertness: Awake/alert Behavior During Therapy: WFL for tasks assessed/performed Overall Cognitive Status: Impaired/Different from baseline                                 General Comments: Pt repeating information and states the same information 3 times to therapist . family translating information and pt responds different with increased understanding.     General Comments  son in law, daughter x2, grandson with wife, and granddaughter present during session    Exercises     Shoulder Instructions      Home Living Family/patient expects to be discharged to:: Private residence Living Arrangements: Children Available Help at Discharge: Family;Available 24 hours/day Type of Home: House Home Access: Stairs to enter [employer] of Steps: 4 Entrance Stairs-Rails: Right Home Layout: One level     Bathroom Shower/Tub: Producer, television/film/video: Standard     Home Equipment: Tub bench;Cane - single Librarian, academic (2 wheels)   Additional Comments: lives with daughter who had a recent foot surgery nwb 8 weeks. family visiting from [place] with daughter able to extend stay past august 2024 as needed. family is renting a house at present with patient living there. pt normally lives at apartment with elevator      Prior Functioning/Environment Prior Level of Function : Needs assist             Mobility Comments: walks with rollator modified independent; minguard on stairs ADLs Comments: daughter reports indep with all task        OT Problem List: Decreased activity tolerance;Decreased strength;Impaired balance (sitting and/or standing)      OT Treatment/Interventions: Self-care/ADL training;Therapeutic exercise;Energy conservation;DME and/or AE instruction;Therapeutic activities;Patient/family education;Balance training    OT Goals(Current goals can be found in the care plan  section) Acute Rehab OT Goals Patient Stated Goal: to get hernia fixed so that he doesnt have to worry that he will go home and have the same problems OT Goal Formulation: With patient/family Time For Goal Achievement: 05/25/23 Potential to Achieve Goals: Good  OT Frequency: Min 2X/week    Co-evaluation              AM-PAC OT "6 Clicks" Daily Activity     Outcome Measure Help from another person eating meals?: A Little Help from another person taking care of personal grooming?: A Little Help from another person toileting, which includes using toliet, bedpan, or urinal?: A Little Help from another person bathing (including washing, rinsing, drying)?: A Little Help from another person to put on and taking off regular upper body clothing?: A Little Help from another person to put on and taking off regular lower body clothing?: A Little 6 Click Score: 18   End of Session Equipment Utilized During Treatment: Rolling walker (2 wheels) Nurse Communication: Mobility status;Precautions  Activity Tolerance: Patient tolerated treatment well Patient left: in chair;with call bell/phone within reach;with family/visitor present  OT Visit Diagnosis: Unsteadiness on feet (R26.81);Muscle weakness (generalized) (M62.81)  Time: 2952-8413 OT Time Calculation (min): 28 min Charges:  OT General Charges $OT Visit: 1 Visit OT Evaluation $OT Eval Moderate Complexity: 1 Mod   Brynn, OTR/L  Acute Rehabilitation Services Office: [phone] .   Mateo Flow 05/11/2023, 5:04 PM

## 2023-05-11 NOTE — Progress Notes (Addendum)
OT NOTE  OT NOTE  OT evaluation completed at this time. OT formal evaluation to follow. Pt currently requires min (A) for basic transfer and increased time  and recommendation for hhot with family (A). Family present in the room and report they can give 24/7 care as long as needed. Daughter, son in law and grandchildren are visiting from [place] at this time.    Mateo Flow   OTR/L Pager: [phone] Office: [phone]

## 2023-05-11 NOTE — Progress Notes (Signed)
Subjective/Chief Complaint: Comfortable this morning Denies abdominal pain or nausea   Objective: Vital signs in last 24 hours: Temp:  [97.6 F (36.4 C)-98.2 F (36.8 C)] 98.2 F (36.8 C) (07/16 0827) Pulse Rate:  [93-112] 97 (07/16 0827) Resp:  [12-33] 20 (07/16 0420) BP: (101-133)/(67-88) 110/75 (07/16 0827) SpO2:  [96 %-99 %] 96 % (07/16 0420) Weight:  [49.1 kg] 49.1 kg (07/16 0700) Last BM Date : 05/09/23  Intake/Output from previous day: 07/15 0701 - 07/16 0700 In: 1000 [IV Piggyback:1000] Out: 200 [Urine:200] Intake/Output this shift: No intake/output data recorded.  Exam: Awake and alert Follows commands Looks comfortable Bilateral inguinal hernias easily reducible and non-tender  Lab Results:  Recent Labs    05/10/23 0918 05/11/23 0156  WBC 9.2 6.5  HGB 9.7* 8.6*  HCT 31.7* 27.9*  PLT 220 196   BMET Recent Labs    05/10/23 0918 05/11/23 0156  NA 138 137  K 4.6 3.8  CL 98 105  CO2 21* 24  GLUCOSE 194* 104*  BUN 23 16  CREATININE 0.81 0.63  CALCIUM 9.1 8.2*   PT/INR No results for input(s): "LABPROT", "INR" in the last 72 hours. ABG No results for input(s): "PHART", "HCO3" in the last 72 hours.  Invalid input(s): "PCO2", "PO2"  Studies/Results: DG Chest 1 View  Result Date: 05/11/2023 CLINICAL DATA:  Congestive heart failure. EXAM: CHEST  1 VIEW COMPARISON:  May 10, 2023. FINDINGS: Stable cardiomediastinal silhouette. Status post coronary bypass graft. Degenerative changes are seen involving both glenohumeral joints. Minimal left midlung and bibasilar opacities are noted concerning for subsegmental atelectasis or scarring. Small left pleural effusion is noted. IMPRESSION: Minimal left mid lung and bibasilar opacities are noted concerning for subsegmental atelectasis or possibly scarring. Small left pleural effusion is noted. Electronically Signed   By: Lupita Raider M.D.   On: 05/11/2023 08:12   CT ABDOMEN PELVIS W CONTRAST  Result  Date: 05/10/2023 CLINICAL DATA:  Right inguinal hernia, epigastric pain EXAM: CT ABDOMEN AND PELVIS WITH CONTRAST TECHNIQUE: Multidetector CT imaging of the abdomen and pelvis was performed using the standard protocol following bolus administration of intravenous contrast. RADIATION DOSE REDUCTION: This exam was performed according to the departmental dose-optimization program which includes automated exposure control, adjustment of the mA and/or kV according to patient size and/or use of iterative reconstruction technique. CONTRAST:  75mL OMNIPAQUE IOHEXOL 350 MG/ML SOLN COMPARISON:  CT chest 07/22/2015 FINDINGS: Lower chest: Small pleural effusions, new since previous, with atelectasis posteriorly at the lung bases. Post median sternotomy. Coronary and aortic atheromatous calcifications. Mitral annulus calcifications. Hepatobiliary: No focal liver abnormality is seen. No gallstones, gallbladder wall thickening, or biliary dilatation. Pancreas: Unremarkable. No pancreatic ductal dilatation or surrounding inflammatory changes. Spleen: Normal in size without focal abnormality. Adrenals/Urinary Tract: She no adrenal mass. Symmetric renal parenchymal enhancement. Multiple cortical lesions in both kidneys, some of which can be characterized as cysts, largest 2.1 cm 5 HU right upper pole; no followup recommended. No evident urolithiasis or hydronephrosis. Urinary bladder physiologically distended. Stomach/Bowel: Stomach is decompressed. Duodenum is nondistended, unremarkable. Proximal small bowel is decompressed. There are multiple dilated mid small bowel loops. A loop of small bowel is involved in right inguinal hernia, and this appears to be a transition point to decompressed distal small bowel. The colon is incompletely distended, with a few scattered diverticula distally. Vascular/Lymphatic: Aortoiliac calcified atheromatous plaque. Portal vein patent. No abdominal or pelvic adenopathy. Reproductive: Prostate  enlargement with central coarse calcifications. Other: Small volume scattered abdominal  and pelvic ascites. Fluid extends into bilateral inguinal hernias. Musculoskeletal: Lumbar levoscoliosis with multilevel spondylitic change. Heterogenous mineralization of the bones. No fracture or or discrete lesion. Sternotomy wires. IMPRESSION: 1. Small-bowel obstruction secondary to right inguinal hernia. 2. Small volume ascites. 3. Small pleural effusions. 4.  Aortic Atherosclerosis (ICD10-I70.0). Electronically Signed   By: Corlis Leak M.D.   On: 05/10/2023 11:29   DG Chest Portable 1 View  Result Date: 05/10/2023 CLINICAL DATA:  Cough and chest pain. EXAM: PORTABLE CHEST 1 VIEW COMPARISON:  April 09, 2018 FINDINGS: Enlarged cardiac silhouette. Stable postsurgical changes from CABG. Chronic elevation of the left hemidiaphragm. Hazy airspace opacities throughout both lungs may represent hypoventilatory changes,, mixed pattern pulmonary edema or atypical/viral pneumonia. IMPRESSION: 1. Hazy airspace opacities throughout both lungs may represent hypoventilatory changes, mixed pattern pulmonary edema or atypical/viral pneumonia. 2. Enlarged cardiac silhouette. Electronically Signed   By: Ted Mcalpine M.D.   On: 05/10/2023 10:57    Anti-infectives: Anti-infectives (From admission, onward)    Start     Dose/Rate Route Frequency Ordered Stop   05/10/23 2200  amoxicillin (AMOXIL) capsule 500 mg        500 mg Oral Every 12 hours 05/10/23 1925 05/12/23 2159       Assessment/Plan: SBO secondary to [facility] Has bilateral inguinal hernias  Hernias remain reducible but come back out easilgy Cardiology has evaluated the patient and he is at high risk of a periop cards event given his age and history.  Risk listed at 11%. Eliquis remains on hold  He is now asymptomatic  I discussed the situation with the patient and his family in the room.  We discussed the risks of surgery vs the risks of repeat incarceration.   They are leaning toward surgery which at the earliest will be tomorrow.   Ultimate plans per D. Carolynne Edouard who is our emergent general surgeon this week  Will allow liquids and the NPO at midnight.  Complex medical decision making   Abigail Miyamoto 05/11/2023

## 2023-05-11 NOTE — Plan of Care (Signed)

## 2023-05-11 NOTE — Plan of Care (Signed)
  Problem: Education: Goal: Ability to describe self-care measures that may prevent or decrease complications (Diabetes Survival Skills Education) will improve Outcome: Progressing   Problem: Coping: Goal: Ability to adjust to condition or change in health will improve Outcome: Progressing   Problem: Health Behavior/Discharge Planning: Goal: Ability to manage health-related needs will improve Outcome: Progressing   Problem: Metabolic: Goal: Ability to maintain appropriate glucose levels will improve Outcome: Progressing

## 2023-05-11 NOTE — Evaluation (Signed)
Physical Therapy Evaluation and Discharge Patient Details Name: Dakota [name] MRN: [id number] DOB: 1926-09-05 Today's Date: 05/11/2023  History of Present Illness  89 y.o. male presented 05/10/23 with recurrent nonreducible right hernia and abdominal pain. Attempting medical management.   PMH significant of chronic right inguinal hernia, PAF on Eliquis, CAD status post CABG x 4, chronic HFrEF LVEF 30-50%, HTN, IIDM,  Clinical Impression   Patient evaluated by Physical Therapy with no further acute PT needs identified. Patient is at his baseline for mobility, however may experience a decline after surgery. Please re-order post-op if indicated.  PT is signing off. Thank you for this referral.         Assistance Recommended at Discharge Intermittent Supervision/Assistance  If plan is discharge home, recommend the following:  Can travel by private vehicle  A little help with walking and/or transfers;Assistance with cooking/housework;Assist for transportation;Help with stairs or ramp for entrance        Equipment Recommendations None recommended by PT  Recommendations for Other Services       Functional Status Assessment Patient has not had a recent decline in their functional status     Precautions / Restrictions Precautions Precautions: Fall      Mobility  Bed Mobility               General bed mobility comments: up in chair on arrival    Transfers Overall transfer level: Needs assistance Equipment used: Rolling walker (2 wheels) Transfers: Sit to/from Stand Sit to Stand: Min assist           General transfer comment: lifting assist to initiate sit to stand    Ambulation/Gait Ambulation/Gait assistance: Min assist Gait Distance (Feet): 180 Feet Assistive device: Rolling walker (2 wheels) Gait Pattern/deviations: Step-through pattern, Decreased stride length, Trunk flexed   Gait velocity interpretation: 1.31 - 2.62 ft/sec, indicative of limited community  ambulator   General Gait Details: pt required assist to maneuver RW around obstacles and 180 degree turn  Stairs            Wheelchair Mobility     Tilt Bed    Modified Rankin (Stroke Patients Only)       Balance Overall balance assessment: Needs assistance Sitting-balance support: No upper extremity supported, Feet supported Sitting balance-Leahy Scale: Fair     Standing balance support: Bilateral upper extremity supported, Reliant on assistive device for balance Standing balance-Leahy Scale: Poor                               Pertinent Vitals/Pain Pain Assessment Pain Assessment: No/denies pain    Home Living Family/patient expects to be discharged to:: Private residence Living Arrangements: Children Available Help at Discharge: Family;Available 24 hours/day Type of Home: House Home Access: Stairs to enter Entrance Stairs-Rails: Right Entrance Stairs-Number of Steps: 4   Home Layout: One level Home Equipment: Rollator (4 wheels)      Prior Function Prior Level of Function : Needs assist             Mobility Comments: walks with rollator modified independent; minguard on stairs       Hand Dominance        Extremity/Trunk Assessment   Upper Extremity Assessment Upper Extremity Assessment: Defer to OT evaluation    Lower Extremity Assessment Lower Extremity Assessment: Generalized weakness    Cervical / Trunk Assessment Cervical / Trunk Assessment: Kyphotic  Communication   Communication: HOH  Cognition  Arousal/Alertness: Awake/alert Behavior During Therapy: WFL for tasks assessed/performed                                            General Comments General comments (skin integrity, edema, etc.): Daughter present    Exercises     Assessment/Plan    PT Assessment Patient does not need any further PT services  PT Problem List         PT Treatment Interventions      PT Goals (Current goals can  be found in the Care Plan section)  Acute Rehab PT Goals Patient Stated Goal: return home PT Goal Formulation: All assessment and education complete, DC therapy    Frequency       Co-evaluation               AM-PAC PT "6 Clicks" Mobility  Outcome Measure Help needed turning from your back to your side while in a flat bed without using bedrails?: None Help needed moving from lying on your back to sitting on the side of a flat bed without using bedrails?: A Little Help needed moving to and from a bed to a chair (including a wheelchair)?: A Little Help needed standing up from a chair using your arms (e.g., wheelchair or bedside chair)?: A Little Help needed to walk in hospital room?: A Little Help needed climbing 3-5 steps with a railing? : A Little 6 Click Score: 19    End of Session Equipment Utilized During Treatment: Gait belt Activity Tolerance: Patient tolerated treatment well Patient left: in chair;with call bell/phone within reach;with family/visitor present Nurse Communication: Mobility status PT Visit Diagnosis: Difficulty in walking, not elsewhere classified (R26.2)    Time: 1136-1203 PT Time Calculation (min) (ACUTE ONLY): 27 min   Charges:   PT Evaluation $PT Eval Low Complexity: 1 Low PT Treatments $Gait Training: 8-22 mins PT General Charges $$ ACUTE PT VISIT: 1 Visit          Jerolyn Center, PT Acute Rehabilitation Services  Office [phone]   Zena Amos 05/11/2023, 3:31 PM

## 2023-05-12 ENCOUNTER — Other Ambulatory Visit: Payer: Self-pay

## 2023-05-12 ENCOUNTER — Inpatient Hospital Stay (HOSPITAL_COMMUNITY): Payer: Medicare HMO | Admitting: Anesthesiology

## 2023-05-12 ENCOUNTER — Encounter (HOSPITAL_COMMUNITY): Payer: Self-pay | Admitting: Internal Medicine

## 2023-05-12 ENCOUNTER — Encounter (HOSPITAL_COMMUNITY)
Admission: EM | Disposition: A | Discharge status: Home Health | Payer: Self-pay | Source: Home / Self Care | Attending: Internal Medicine

## 2023-05-12 DIAGNOSIS — I5022 Chronic systolic (congestive) heart failure: Secondary | ICD-10-CM | POA: Diagnosis not present

## 2023-05-12 DIAGNOSIS — I110 Hypertensive heart disease with heart failure: Secondary | ICD-10-CM | POA: Diagnosis not present

## 2023-05-12 DIAGNOSIS — K56609 Unspecified intestinal obstruction, unspecified as to partial versus complete obstruction: Secondary | ICD-10-CM | POA: Diagnosis not present

## 2023-05-12 DIAGNOSIS — I25119 Atherosclerotic heart disease of native coronary artery with unspecified angina pectoris: Secondary | ICD-10-CM

## 2023-05-12 DIAGNOSIS — K4090 Unilateral inguinal hernia, without obstruction or gangrene, not specified as recurrent: Secondary | ICD-10-CM | POA: Diagnosis not present

## 2023-05-12 HISTORY — PX: INSERTION OF MESH: SHX5868

## 2023-05-12 HISTORY — PX: INGUINAL HERNIA REPAIR: SHX194

## 2023-05-12 LAB — CBC WITH DIFFERENTIAL/PLATELET
Abs Immature Granulocytes: 0.01 10*3/uL (ref 0.00–0.07)
Basophils Absolute: 0 10*3/uL (ref 0.0–0.1)
Basophils Relative: 1 %
Eosinophils Absolute: 0.1 10*3/uL (ref 0.0–0.5)
Eosinophils Relative: 2 %
HCT: 27.9 % — ABNORMAL LOW (ref 39.0–52.0)
Hemoglobin: 8.8 g/dL — ABNORMAL LOW (ref 13.0–17.0)
Immature Granulocytes: 0 %
Lymphocytes Relative: 29 %
Lymphs Abs: 1.6 10*3/uL (ref 0.7–4.0)
MCH: 19.9 pg — ABNORMAL LOW (ref 26.0–34.0)
MCHC: 31.5 g/dL (ref 30.0–36.0)
MCV: 63 fL — ABNORMAL LOW (ref 80.0–100.0)
Monocytes Absolute: 0.6 10*3/uL (ref 0.1–1.0)
Monocytes Relative: 12 %
Neutro Abs: 3.1 10*3/uL (ref 1.7–7.7)
Neutrophils Relative %: 56 %
Platelets: 190 10*3/uL (ref 150–400)
RBC: 4.43 MIL/uL (ref 4.22–5.81)
RDW: 15.8 % — ABNORMAL HIGH (ref 11.5–15.5)
WBC: 5.4 10*3/uL (ref 4.0–10.5)
nRBC: 0 % (ref 0.0–0.2)

## 2023-05-12 LAB — BASIC METABOLIC PANEL
Anion gap: 8 (ref 5–15)
BUN: 12 mg/dL (ref 8–23)
CO2: 26 mmol/L (ref 22–32)
Calcium: 8.7 mg/dL — ABNORMAL LOW (ref 8.9–10.3)
Chloride: 104 mmol/L (ref 98–111)
Creatinine, Ser: 0.71 mg/dL (ref 0.61–1.24)
GFR, Estimated: >60 mL/min (ref >60)
Glucose, Bld: 126 mg/dL — ABNORMAL HIGH (ref 70–99)
Potassium: 3.6 mmol/L (ref 3.5–5.1)
Sodium: 138 mmol/L (ref 135–145)

## 2023-05-12 LAB — GLUCOSE, CAPILLARY
Glucose-Capillary: 115 mg/dL — ABNORMAL HIGH (ref 70–99)
Glucose-Capillary: 174 mg/dL — ABNORMAL HIGH (ref 70–99)
Glucose-Capillary: 182 mg/dL — ABNORMAL HIGH (ref 70–99)
Glucose-Capillary: 205 mg/dL — ABNORMAL HIGH (ref 70–99)

## 2023-05-12 LAB — URINALYSIS, COMPLETE (UACMP) WITH MICROSCOPIC
Bacteria, UA: NONE SEEN
Bilirubin Urine: NEGATIVE
Glucose, UA: NEGATIVE mg/dL
Hgb urine dipstick: NEGATIVE
Ketones, ur: NEGATIVE mg/dL
Leukocytes,Ua: NEGATIVE
Nitrite: NEGATIVE
Protein, ur: NEGATIVE mg/dL
Specific Gravity, Urine: 1.005 (ref 1.005–1.030)
pH: 7 (ref 5.0–8.0)

## 2023-05-12 LAB — C-REACTIVE PROTEIN: CRP: <0.5 mg/dL (ref <1.0)

## 2023-05-12 LAB — PATHOLOGIST SMEAR REVIEW

## 2023-05-12 LAB — BRAIN NATRIURETIC PEPTIDE: B Natriuretic Peptide: 826.8 pg/mL — ABNORMAL HIGH (ref 0.0–100.0)

## 2023-05-12 LAB — PROCALCITONIN: Procalcitonin: <0.1 ng/mL

## 2023-05-12 LAB — MAGNESIUM: Magnesium: 1.9 mg/dL (ref 1.7–2.4)

## 2023-05-12 SURGERY — REPAIR, HERNIA, INGUINAL, ADULT
Anesthesia: General | Site: Groin | Laterality: Bilateral

## 2023-05-12 MED ORDER — ONDANSETRON HCL 4 MG/2ML IJ SOLN
INTRAMUSCULAR | Status: AC
  Filled 2023-05-12: qty 2

## 2023-05-12 MED ORDER — FENTANYL CITRATE (PF) 100 MCG/2ML IJ SOLN: 25.0000 ug | INTRAMUSCULAR | Status: DC | PRN

## 2023-05-12 MED ORDER — CHLORHEXIDINE GLUCONATE 0.12 % MT SOLN
OROMUCOSAL | Status: AC
  Administered 2023-05-12: 15 mL via OROMUCOSAL
  Filled 2023-05-12: qty 15

## 2023-05-12 MED ORDER — BUPIVACAINE-EPINEPHRINE 0.25% -1:200000 IJ SOLN
INTRAMUSCULAR | Status: DC | PRN
  Administered 2023-05-12 (×2): 10 mL

## 2023-05-12 MED ORDER — ACETAMINOPHEN 10 MG/ML IV SOLN
500.0000 mg | Freq: Three times a day (TID) | INTRAVENOUS | Status: DC | PRN
  Administered 2023-05-13: 500 mg via INTRAVENOUS
  Filled 2023-05-12: qty 50

## 2023-05-12 MED ORDER — FENTANYL CITRATE (PF) 250 MCG/5ML IJ SOLN
INTRAMUSCULAR | Status: AC
  Filled 2023-05-12: qty 5

## 2023-05-12 MED ORDER — ONDANSETRON HCL 4 MG/2ML IJ SOLN
INTRAMUSCULAR | Status: DC | PRN
  Administered 2023-05-12: 4 mg via INTRAVENOUS

## 2023-05-12 MED ORDER — ORAL CARE MOUTH RINSE: 15.0000 mL | Freq: Once | OROMUCOSAL | Status: AC

## 2023-05-12 MED ORDER — INSULIN ASPART 100 UNIT/ML IJ SOLN
0.0000 [IU] | Freq: Four times a day (QID) | INTRAMUSCULAR | Status: DC
  Administered 2023-05-12: 2 [IU] via SUBCUTANEOUS
  Administered 2023-05-12: 3 [IU] via SUBCUTANEOUS
  Administered 2023-05-13: 2 [IU] via SUBCUTANEOUS
  Administered 2023-05-14: 1 [IU] via SUBCUTANEOUS
  Administered 2023-05-14: 2 [IU] via SUBCUTANEOUS
  Administered 2023-05-14 – 2023-05-15 (×3): 1 [IU] via SUBCUTANEOUS
  Administered 2023-05-16 (×2): 2 [IU] via SUBCUTANEOUS
  Administered 2023-05-17: 1 [IU] via SUBCUTANEOUS

## 2023-05-12 MED ORDER — PHENYLEPHRINE HCL-NACL 20-0.9 MG/250ML-% IV SOLN
INTRAVENOUS | Status: DC | PRN
  Administered 2023-05-12: 30 ug/min via INTRAVENOUS

## 2023-05-12 MED ORDER — LACTATED RINGERS IV SOLN: INTRAVENOUS | Status: DC

## 2023-05-12 MED ORDER — DEXAMETHASONE SODIUM PHOSPHATE 10 MG/ML IJ SOLN
INTRAMUSCULAR | Status: DC | PRN
  Administered 2023-05-12: 4 mg via INTRAVENOUS

## 2023-05-12 MED ORDER — ROCURONIUM BROMIDE 10 MG/ML (PF) SYRINGE
PREFILLED_SYRINGE | INTRAVENOUS | Status: DC | PRN
  Administered 2023-05-12: 10 mg via INTRAVENOUS
  Administered 2023-05-12: 40 mg via INTRAVENOUS

## 2023-05-12 MED ORDER — INSULIN ASPART 100 UNIT/ML IJ SOLN: 0.0000 [IU] | INTRAMUSCULAR | Status: DC | PRN

## 2023-05-12 MED ORDER — HYDRALAZINE HCL 10 MG PO TABS
10.0000 mg | ORAL_TABLET | Freq: Three times a day (TID) | ORAL | Status: DC
Start: 2023-05-12 — End: 2023-05-12

## 2023-05-12 MED ORDER — DEXAMETHASONE SODIUM PHOSPHATE 10 MG/ML IJ SOLN
INTRAMUSCULAR | Status: AC
  Filled 2023-05-12: qty 1

## 2023-05-12 MED ORDER — HYDROMORPHONE HCL 1 MG/ML IJ SOLN
0.5000 mg | INTRAMUSCULAR | Status: DC | PRN
  Administered 2023-05-13: 0.5 mg via INTRAVENOUS
  Filled 2023-05-12: qty 0.5

## 2023-05-12 MED ORDER — SUGAMMADEX SODIUM 200 MG/2ML IV SOLN
INTRAVENOUS | Status: DC | PRN
  Administered 2023-05-12: 100 mg via INTRAVENOUS

## 2023-05-12 MED ORDER — LIDOCAINE 2% (20 MG/ML) 5 ML SYRINGE
INTRAMUSCULAR | Status: DC | PRN
  Administered 2023-05-12: 40 mg via INTRAVENOUS

## 2023-05-12 MED ORDER — FENTANYL CITRATE (PF) 250 MCG/5ML IJ SOLN
INTRAMUSCULAR | Status: DC | PRN
  Administered 2023-05-12 (×2): 25 ug via INTRAVENOUS
  Administered 2023-05-12 (×2): 50 ug via INTRAVENOUS

## 2023-05-12 MED ORDER — ACETAMINOPHEN 10 MG/ML IV SOLN
1000.0000 mg | Freq: Once | INTRAVENOUS | Status: DC | PRN
  Administered 2023-05-12: 1000 mg via INTRAVENOUS

## 2023-05-12 MED ORDER — CEFAZOLIN SODIUM-DEXTROSE 1-4 GM/50ML-% IV SOLN
INTRAVENOUS | Status: DC | PRN
  Administered 2023-05-12: 1 g via INTRAVENOUS

## 2023-05-12 MED ORDER — LOSARTAN POTASSIUM 25 MG PO TABS
12.5000 mg | ORAL_TABLET | Freq: Every day | ORAL | Status: DC
  Administered 2023-05-12: 12.5 mg via ORAL
  Filled 2023-05-12 (×2): qty 0.5

## 2023-05-12 MED ORDER — CHLORHEXIDINE GLUCONATE 0.12 % MT SOLN: 15.0000 mL | Freq: Once | OROMUCOSAL | Status: AC

## 2023-05-12 MED ORDER — PROPOFOL 10 MG/ML IV BOLUS
INTRAVENOUS | Status: DC | PRN
Start: 2023-05-12 — End: 2023-05-12
  Administered 2023-05-12: 20 mg via INTRAVENOUS
  Administered 2023-05-12: 50 mg via INTRAVENOUS

## 2023-05-12 MED ORDER — OXYCODONE HCL 5 MG PO TABS: 5.0000 mg | ORAL_TABLET | ORAL | Status: DC | PRN

## 2023-05-12 MED ORDER — 0.9 % SODIUM CHLORIDE (POUR BTL) OPTIME
TOPICAL | Status: DC | PRN
  Administered 2023-05-12: 1000 mL

## 2023-05-12 MED ORDER — BUPIVACAINE-EPINEPHRINE (PF) 0.25% -1:200000 IJ SOLN
INTRAMUSCULAR | Status: AC
  Filled 2023-05-12: qty 30

## 2023-05-12 MED ORDER — ACETAMINOPHEN 10 MG/ML IV SOLN
INTRAVENOUS | Status: AC
  Filled 2023-05-12: qty 100

## 2023-05-12 MED ORDER — LIDOCAINE 2% (20 MG/ML) 5 ML SYRINGE
INTRAMUSCULAR | Status: AC
  Filled 2023-05-12: qty 5

## 2023-05-12 SURGICAL SUPPLY — 36 items
ADH SKN CLS APL DERMABOND .7 (GAUZE/BANDAGES/DRESSINGS) ×4
APL PRP STRL LF DISP 70% ISPRP (MISCELLANEOUS) ×2
BLADE CLIPPER SURG (BLADE) ×1 IMPLANT
CHLORAPREP W/TINT 26 (MISCELLANEOUS) ×3 IMPLANT
COVER SURGICAL LIGHT HANDLE (MISCELLANEOUS) ×3 IMPLANT
DERMABOND ADVANCED .7 DNX12 (GAUZE/BANDAGES/DRESSINGS) ×4 IMPLANT
DRAIN PENROSE 0.5X18 (DRAIN) ×3 IMPLANT
DRAPE LAPAROSCOPIC ABDOMINAL (DRAPES) ×3 IMPLANT
ELECT REM PT RETURN 9FT ADLT (ELECTROSURGICAL) ×2
ELECTRODE REM PT RTRN 9FT ADLT (ELECTROSURGICAL) ×3 IMPLANT
GLOVE BIO SURGEON STRL SZ7.5 (GLOVE) ×3 IMPLANT
GOWN STRL REUS W/ TWL LRG LVL3 (GOWN DISPOSABLE) ×6 IMPLANT
GOWN STRL REUS W/TWL LRG LVL3 (GOWN DISPOSABLE) ×4
KIT BASIN OR (CUSTOM PROCEDURE TRAY) ×3 IMPLANT
KIT TURNOVER KIT B (KITS) ×3 IMPLANT
MESH ULTRAPRO 3X6 7.6X15CM (Mesh General) ×2 IMPLANT
NDL HYPO 25GX1X1/2 BEV (NEEDLE) ×2 IMPLANT
NEEDLE HYPO 25GX1X1/2 BEV (NEEDLE) ×2 IMPLANT
NS IRRIG 1000ML POUR BTL (IV SOLUTION) ×3 IMPLANT
PACK GENERAL/GYN (CUSTOM PROCEDURE TRAY) ×3 IMPLANT
PAD ARMBOARD 7.5X6 YLW CONV (MISCELLANEOUS) ×6 IMPLANT
SUT MNCRL AB 4-0 PS2 18 (SUTURE) ×4 IMPLANT
SUT PROLENE 2 0 CT2 30 (SUTURE) ×1 IMPLANT
SUT PROLENE 2 0 SH DA (SUTURE) ×7 IMPLANT
SUT SILK 2 0 SH (SUTURE) ×1 IMPLANT
SUT SILK 3 0 (SUTURE) ×4
SUT SILK 3-0 18XBRD TIE 12 (SUTURE) ×4 IMPLANT
SUT VIC AB 0 CT1 27 (SUTURE) ×2
SUT VIC AB 0 CT1 27XBRD ANBCTR (SUTURE) ×3 IMPLANT
SUT VIC AB 2-0 SH 27 (SUTURE) ×4
SUT VIC AB 2-0 SH 27X BRD (SUTURE) ×4 IMPLANT
SUT VIC AB 3-0 SH 27 (SUTURE) ×4
SUT VIC AB 3-0 SH 27X BRD (SUTURE) ×1 IMPLANT
SUT VIC AB 3-0 SH 27XBRD (SUTURE) ×3 IMPLANT
SYR CONTROL 10ML LL (SYRINGE) ×3 IMPLANT
TOWEL GREEN STERILE (TOWEL DISPOSABLE) ×3 IMPLANT

## 2023-05-12 NOTE — Progress Notes (Signed)
Dr. Thedore Mins advised to dc IVF if patient has a diet.  The family asked that I do not turn off the fluids since patient only ate 2 spoons of broth and 1/2 of a mango icepop.  I informed Dr. Thedore Mins of family's request.

## 2023-05-12 NOTE — Plan of Care (Signed)
Patient now in the OR. Agree with progress note from Cyndi Bender. He does not require bridging. EF is stable, mildly decreased. His aortic valve is heavily calcified but he does not have severe AS.

## 2023-05-12 NOTE — Progress Notes (Addendum)
PROGRESS NOTE                                                                                                                                                                                                             Patient Demographics:    Dakota [name], is a 89 y.o. male, DOB - 1926-01-28, NFA:[id number]  Outpatient Primary MD for the patient is Tisovec, Adelfa Koh, MD    LOS - 2  Admit date - 05/10/2023    Chief Complaint  Patient presents with   Hernia   Chest Pain       Brief Narrative (HPI from H&P)   89 y.o. male with medical history significant of chronic right inguinal hernia, PAF on Eliquis, CAD status post CABG x 4, chronic HFrEF LVEF 30-50%, HTN, IIDM, presented with recurrent nonreducible right hernia and abdominal pain.    Subjective:   Patient in bed, appears comfortable, denies any headache, no fever, no chest pain or pressure, no shortness of breath , no abdominal pain. No new focal weakness.   Assessment  & Plan :   SBO, Recurrent right inguinal hernia -  seen by [facility], currently on conservative treatment, clear liquids for now, n.p.o. after midnight except for medications on 05/12/2023, likely surgical reduction on 05/12/2023.  Type screen done, also seen by cardiology prior to surgery, moderate to high risk candidate for adverse cardiopulmonary outcome, patient and family agreeable to proceed with surgery.  Agreeable for transfusion if needed.    Paroxysmal A-fib with RVR - seen by cardiology, low-dose oral beta-blocker, heparin drip, [place] vas 2 score of greater than 6, home Eliquis on hold, Lovenox when appropriate, family does not want to use heparin due to allergies, received 1 units of IV digoxin in the ER, currently on oral and IV Lopressor.  Continue to monitor if rate control becomes an issue with low blood pressures we will utilize amiodarone, stable TSH echo noted.  Chronic systolic heart failure EF  now improved to 45% from 40% 2 years ago- he is [place] compensated seen by cardiology, on low-dose beta-blocker and Imdur, will add ARB as well, he also has at least moderate to severe AAS as well, hence will avoid rapid drop in preload, clinically compensated.    Patient on chronic steroids.  Continue.  Monitor closely.    CAD CABG  -  No chest pains, troponin negative x 2, No acute concerns continue statin and beta-blocker as tolerated.   IIDM  -SSI for now, gentle D5 while n.p.o.   CBG (last 3)  Recent Labs    05/11/23 1139 05/11/23 1600 05/11/23 2033  GLUCAP 104* 164* 157*   Lab Results  Component Value Date   TSH 0.922 05/11/2023         Condition - Extremely Guarded  Family Communication  : Daughter bedside 05/11/2023, 05/12/23  Code Status : Full code  Consults  : Cardiology, CCS  PUD Prophylaxis : PPI   Procedures  :     TTE - 1. Technically difficult study.  2. Left ventricular ejection fraction, by estimation, is 40 to 45%. The left ventricle has mildly decreased function. The left ventricle demonstrates global hypokinesis. There is mild concentric left ventricular hypertrophy. Left ventricular diastolic function could not be evaluated. Significant dyssynchrony due to LBBB.  3. Right ventricular systolic function is normal. The right ventricular size is normal.  4. There is severe calcifcation of the aortic valve. There is severe thickening of the aortic valve. Aortic valve regurgitation is not visualized. The aortic valve appears severly stenotic by visualization. There was limited/incomplete doppler evaluation for aortic stenosis with a mean gradient of .  5. The mitral valve is degenerative. Mild mitral valve regurgitation. Moderate to severe mitral annular calcification.  CT - 1. Small-bowel obstruction secondary to right inguinal hernia. 2. Small volume ascites. 3. Small pleural effusions. 4.  Aortic Atherosclerosis      Disposition Plan  :    Status is:  Inpatient  DVT Prophylaxis  :        Lab Results  Component Value Date   PLT 190 05/12/2023    Diet :  Diet Order             Diet NPO time specified Except for: Sips with Meds  Diet effective midnight                    Inpatient Medications  Scheduled Meds:  amoxicillin  500 mg Oral Q12H   dexamethasone (DECADRON) injection  4 mg Intravenous Q24H   finasteride  5 mg Oral QHS   insulin aspart  0-9 Units Subcutaneous TID WC   isosorbide mononitrate  30 mg Oral Daily   ketorolac  1 drop Both Eyes QID   metoprolol tartrate  25 mg Oral BID   mirabegron ER  50 mg Oral QHS   ondansetron (ZOFRAN) IV  4 mg Intravenous Once   pantoprazole (PROTONIX) IV  40 mg Intravenous Q24H   pravastatin  80 mg Oral QHS   prednisoLONE acetate  1 drop Left Eye QID   Continuous Infusions:  dextrose 5 % with KCl 20 mEq / L 20 mEq (05/12/23 0038)   PRN Meds:.albuterol, HYDROmorphone (DILAUDID) injection, metoprolol tartrate, nitroGLYCERIN, ondansetron **OR** ondansetron (ZOFRAN) IV  Antibiotics  :    Anti-infectives (From admission, onward)    Start     Dose/Rate Route Frequency Ordered Stop   05/10/23 2200  amoxicillin (AMOXIL) capsule 500 mg        500 mg Oral Every 12 hours 05/10/23 1925 05/12/23 2159         Objective:   Vitals:   05/11/23 1556 05/11/23 1944 05/11/23 2035 05/11/23 2305  BP: 109/79  110/75 133/74  Pulse: 88   79  Resp: (!) 26 18 17 19   Temp: 98.2 F (36.8 C)  (!) 97.5 F (  36.4 C) (!) 97.5 F (36.4 C)  TempSrc: Oral  Oral Oral  SpO2: 95%   97%  Weight:        Wt Readings from Last 3 Encounters:  05/11/23 49.1 kg  03/05/23 49.1 kg  11/25/22 48.4 kg     Intake/Output Summary (Last 24 hours) at 05/12/2023 0717 Last data filed at 05/12/2023 0500 Gross per 24 hour  Intake --  Output 401 ml  Net -401 ml     Physical Exam  Awake Alert, No new F.N deficits, Normal affect [place].AT,PERRAL Supple Neck, No JVD,   Symmetrical Chest wall movement,  Good air movement bilaterally, CTAB RRR,No Gallops,Rubs or new Murmurs,  +ve B.Sounds, Abd Soft, mild RLQ tenderness,   No Cyanosis, Clubbing or edema        Data Review:    Recent Labs  Lab 05/10/23 0918 05/11/23 0156 05/12/23 0549  WBC 9.2 6.5 5.4  HGB 9.7* 8.6* 8.8*  HCT 31.7* 27.9* 27.9*  PLT 220 196 190  MCV 63.5* 62.1* 63.0*  MCH 19.4* 19.2* 19.9*  MCHC 30.6 30.8 31.5  RDW 16.2* 15.9* 15.8*  LYMPHSABS 0.8  --  1.6  MONOABS 0.3  --  0.6  EOSABS 0.0  --  0.1  BASOSABS 0.0  --  0.0    Recent Labs  Lab 05/10/23 0918 05/10/23 1100 05/10/23 1239 05/10/23 1634 05/11/23 0156 05/11/23 0655 05/12/23 0549  NA 138  --   --   --  137  --  138  K 4.6  --   --   --  3.8  --  3.6  CL 98  --   --   --  105  --  104  CO2 21*  --   --   --  24  --  26  ANIONGAP 19*  --   --   --  8  --  8  GLUCOSE 194*  --   --   --  104*  --  126*  BUN 23  --   --   --  16  --  12  CREATININE 0.81  --   --   --  0.63  --  0.71  AST 30  --   --   --   --   --   --   ALT 14  --   --   --   --   --   --   ALKPHOS 38  --   --   --   --   --   --   BILITOT 1.2  --   --   --   --   --   --   ALBUMIN 3.9  --   --   --   --   --   --   CRP  --   --   --   --   --  0.6 <0.5  PROCALCITON  --   --   --   --   --  <0.10  --   LATICACIDVEN  --   --  1.1 1.0  --   --   --   TSH  --   --   --   --   --  0.922  --   HGBA1C  --   --   --  6.0*  --   --   --   BNP  --  1,170.3*  --   --   --  824.9*  826.8*  MG  --   --   --  1.9  --   --  1.9  CALCIUM 9.1  --   --   --  8.2*  --  8.7*      Recent Labs  Lab 05/10/23 0918 05/10/23 1100 05/10/23 1239 05/10/23 1634 05/11/23 0156 05/11/23 0655 05/12/23 0549  CRP  --   --   --   --   --  0.6 <0.5  PROCALCITON  --   --   --   --   --  <0.10  --   LATICACIDVEN  --   --  1.1 1.0  --   --   --   TSH  --   --   --   --   --  0.922  --   HGBA1C  --   --   --  6.0*  --   --   --   BNP  --  1,170.3*  --   --   --  824.9* 826.8*  MG  --   --   --  1.9   --   --  1.9  CALCIUM 9.1  --   --   --  8.2*  --  8.7*      Lab Results  Component Value Date   HGBA1C 6.0 (H) 05/10/2023    Radiology Reports ECHOCARDIOGRAM COMPLETE  Result Date: 05/11/2023    ECHOCARDIOGRAM REPORT   Patient Name:   KESHON MARKOVITZ Date of Exam: 05/11/2023 Medical Rec #:  [id number]    Height:       62.0 in Accession #:    [id number]   Weight:       108.2 lb Date of Birth:  13-Jan-1926     BSA:          1.473 m Patient Age:    89 years     BP:           117/78 mmHg Patient Gender: M            HR:           80 bpm. Exam Location:  Inpatient Procedure: 2D Echo, Color Doppler and Cardiac Doppler Indications:    CHF  History:        Patient has prior history of Echocardiogram examinations, most                 recent 04/14/2021. CAD, SBO and COPD; Risk Factors:Dyslipidemia                 and Diabetes.  Sonographer:    Milbert Coulter Referring Phys: [id number] Emeline General IMPRESSIONS  1. Technically difficult study.  2. Left ventricular ejection fraction, by estimation, is 40 to 45%. The left ventricle has mildly decreased function. The left ventricle demonstrates global hypokinesis. There is mild concentric left ventricular hypertrophy. Left ventricular diastolic function could not be evaluated. Significant dyssynchrony due to LBBB.  3. Right ventricular systolic function is normal. The right ventricular size is normal.  4. There is severe calcifcation of the aortic valve. There is severe thickening of the aortic valve. Aortic valve regurgitation is not visualized. The aortic valve appears severly stenotic by visualization. There was limited/incomplete doppler evaluation for aortic stenosis with a mean gradient of .  5. The mitral valve is degenerative. Mild mitral valve regurgitation. Moderate to severe mitral annular calcification. FINDINGS  Left Ventricle: Left ventricular ejection fraction, by estimation, is 40 to 45%. The left  ventricle has mildly decreased function. The left ventricle  demonstrates global hypokinesis. The left ventricular internal cavity size was normal in size. There is  mild concentric left ventricular hypertrophy. Abnormal (paradoxical) septal motion, consistent with left bundle branch block. Left ventricular diastolic function could not be evaluated due to nondiagnostic images. Left ventricular diastolic function could not be evaluated. Right Ventricle: The right ventricular size is normal. No increase in right ventricular wall thickness. Right ventricular systolic function is normal. Left Atrium: Left atrial size was normal in size. Right Atrium: Right atrial size was normal in size. Pericardium: There is no evidence of pericardial effusion. Mitral Valve: The mitral valve is degenerative in appearance. There is mild thickening of the mitral valve leaflet(s). There is mild calcification of the mitral valve leaflet(s). Moderate to severe mitral annular calcification. Mild mitral valve regurgitation. Tricuspid Valve: The tricuspid valve is grossly normal. Tricuspid valve regurgitation is mild. Aortic Valve: The aortic valve is calcified. There is severe calcifcation of the aortic valve. There is severe thickening of the aortic valve. There is moderate aortic valve annular calcification. Aortic valve regurgitation is not visualized. Aortic valve mean gradient measures 5.0 mmHg. Aortic valve peak gradient measures 9.2 mmHg. Aortic valve area, by VTI measures 1.98 cm. Pulmonic Valve: The pulmonic valve was not well visualized. Pulmonic valve regurgitation is trivial. Aorta: The aortic root and ascending aorta are structurally normal, with no evidence of dilitation. IAS/Shunts: No atrial level shunt detected by color flow Doppler.  LEFT VENTRICLE PLAX 2D LVIDd:         4.00 cm   Diastology LVIDs:         3.00 cm   LV e' medial: 7.29 cm/s LV PW:         1.00 cm LV IVS:        1.00 cm LVOT diam:     2.10 cm LV SV:         41 LV SV Index:   28 LVOT Area:     3.46 cm  RIGHT VENTRICLE  RV S prime:     6.53 cm/s TAPSE (M-mode): 1.3 cm LEFT ATRIUM             Index        RIGHT ATRIUM           Index LA Vol (A2C):   61.6 ml 41.83 ml/m  RA Area:     25.70 cm LA Vol (A4C):   65.5 ml 44.48 ml/m  RA Volume:   76.70 ml  52.08 ml/m LA Biplane Vol: 67.5 ml 45.84 ml/m  AORTIC VALVE AV Area (Vmax):    1.66 cm AV Area (Vmean):   1.67 cm AV Area (VTI):     1.98 cm AV Vmax:           152.00 cm/s AV Vmean:          97.700 cm/s AV VTI:            0.206 m AV Peak Grad:      9.2 mmHg AV Mean Grad:      5.0 mmHg LVOT Vmax:         72.80 cm/s LVOT Vmean:        47.100 cm/s LVOT VTI:          0.118 m LVOT/AV VTI ratio: 0.57  AORTA Ao Root diam: 3.60 cm Ao Asc diam:  3.00 cm TRICUSPID VALVE TR Peak grad:   35.3 mmHg TR Vmax:  297.00 cm/s  SHUNTS Systemic VTI:  0.12 m Systemic Diam: 2.10 cm Aditya Sabharwal Electronically signed by [name] Signature Date/Time: 05/11/2023/6:14:34 PM    Final    DG Chest 1 View  Result Date: 05/11/2023 CLINICAL DATA:  Congestive heart failure. EXAM: CHEST  1 VIEW COMPARISON:  May 10, 2023. FINDINGS: Stable cardiomediastinal silhouette. Status post coronary bypass graft. Degenerative changes are seen involving both glenohumeral joints. Minimal left midlung and bibasilar opacities are noted concerning for subsegmental atelectasis or scarring. Small left pleural effusion is noted. IMPRESSION: Minimal left mid lung and bibasilar opacities are noted concerning for subsegmental atelectasis or possibly scarring. Small left pleural effusion is noted. Electronically Signed   By: Lupita Raider M.D.   On: 05/11/2023 08:12   CT ABDOMEN PELVIS W CONTRAST  Result Date: 05/10/2023 CLINICAL DATA:  Right inguinal hernia, epigastric pain EXAM: CT ABDOMEN AND PELVIS WITH CONTRAST TECHNIQUE: Multidetector CT imaging of the abdomen and pelvis was performed using the standard protocol following bolus administration of intravenous contrast. RADIATION DOSE REDUCTION: This exam  was performed according to the departmental dose-optimization program which includes automated exposure control, adjustment of the mA and/or kV according to patient size and/or use of iterative reconstruction technique. CONTRAST:  75mL OMNIPAQUE IOHEXOL 350 MG/ML SOLN COMPARISON:  CT chest 07/22/2015 FINDINGS: Lower chest: Small pleural effusions, new since previous, with atelectasis posteriorly at the lung bases. Post median sternotomy. Coronary and aortic atheromatous calcifications. Mitral annulus calcifications. Hepatobiliary: No focal liver abnormality is seen. No gallstones, gallbladder wall thickening, or biliary dilatation. Pancreas: Unremarkable. No pancreatic ductal dilatation or surrounding inflammatory changes. Spleen: Normal in size without focal abnormality. Adrenals/Urinary Tract: She no adrenal mass. Symmetric renal parenchymal enhancement. Multiple cortical lesions in both kidneys, some of which can be characterized as cysts, largest 2.1 cm 5 HU right upper pole; no followup recommended. No evident urolithiasis or hydronephrosis. Urinary bladder physiologically distended. Stomach/Bowel: Stomach is decompressed. Duodenum is nondistended, unremarkable. Proximal small bowel is decompressed. There are multiple dilated mid small bowel loops. A loop of small bowel is involved in right inguinal hernia, and this appears to be a transition point to decompressed distal small bowel. The colon is incompletely distended, with a few scattered diverticula distally. Vascular/Lymphatic: Aortoiliac calcified atheromatous plaque. Portal vein patent. No abdominal or pelvic adenopathy. Reproductive: Prostate enlargement with central coarse calcifications. Other: Small volume scattered abdominal and pelvic ascites. Fluid extends into bilateral inguinal hernias. Musculoskeletal: Lumbar levoscoliosis with multilevel spondylitic change. Heterogenous mineralization of the bones. No fracture or or discrete lesion. Sternotomy  wires. IMPRESSION: 1. Small-bowel obstruction secondary to right inguinal hernia. 2. Small volume ascites. 3. Small pleural effusions. 4.  Aortic Atherosclerosis (ICD10-I70.0). Electronically Signed   By: Corlis Leak M.D.   On: 05/10/2023 11:29   DG Chest Portable 1 View  Result Date: 05/10/2023 CLINICAL DATA:  Cough and chest pain. EXAM: PORTABLE CHEST 1 VIEW COMPARISON:  April 09, 2018 FINDINGS: Enlarged cardiac silhouette. Stable postsurgical changes from CABG. Chronic elevation of the left hemidiaphragm. Hazy airspace opacities throughout both lungs may represent hypoventilatory changes,, mixed pattern pulmonary edema or atypical/viral pneumonia. IMPRESSION: 1. Hazy airspace opacities throughout both lungs may represent hypoventilatory changes, mixed pattern pulmonary edema or atypical/viral pneumonia. 2. Enlarged cardiac silhouette. Electronically Signed   By: Ted Mcalpine M.D.   On: 05/10/2023 10:57      Signature  -   Susa Raring M.D on 05/12/2023 at 7:17 AM   -  To page go to [email or web address]

## 2023-05-12 NOTE — Transfer of Care (Signed)
Immediate Anesthesia Transfer of Care Note  Patient: Dakota [name]  Procedure(s) Performed: HERNIA REPAIR INGUINAL ADULT (Bilateral: Groin)  Patient Location: PACU  Anesthesia Type:General  Level of Consciousness: drowsy  Airway & Oxygen Therapy: Patient Spontanous Breathing and Patient connected to face mask oxygen  Post-op Assessment: Report given to RN and Post -op Vital signs reviewed and stable  Post vital signs: Reviewed and stable  Last Vitals:  Vitals Value Taken Time  BP 137/96 05/12/23 1330  Temp    Pulse 90 05/12/23 1331  Resp 19 05/12/23 1331  SpO2 100 % 05/12/23 1331  Vitals shown include unfiled device data.  Last Pain:  Vitals:   05/12/23 1007  TempSrc: Axillary  PainSc:          Complications: No notable events documented.

## 2023-05-12 NOTE — Progress Notes (Signed)
Heart Failure Navigator Progress Note  Assessed for Heart & Vascular TOC clinic readiness.  Patient does not meet criteria due to EF 40-45%, more chronic Heart failure with clinical euvolemic and mild dehydration. .   Navigator will sign off at this time.   Rhae Hammock, BSN, Scientist, clinical (histocompatibility and immunogenetics) Only

## 2023-05-12 NOTE — H&P (View-Only) (Signed)
Subjective/Chief Complaint: No complaints, ready for surgery.   Objective: Vital signs in last 24 hours: Temp:  [97.3 F (36.3 C)-98.2 F (36.8 C)] 97.6 F (36.4 C) (07/17 0826) Pulse Rate:  [79-96] 88 (07/17 0826) Resp:  [17-26] 19 (07/16 2305) BP: (109-133)/(70-85) 129/85 (07/17 0826) SpO2:  [95 %-97 %] 96 % (07/17 0826) Last BM Date : 05/09/23  Intake/Output from previous day: 07/16 0701 - 07/17 0700 In: -  Out: 401 [Urine:400; Stool:1] Intake/Output this shift: No intake/output data recorded.  General appearance: alert and cooperative Resp: clear to auscultation bilaterally Cardio: regular rate and rhythm GI: Soft, nontender.  Bilateral inguinal hernias right greater than left  Lab Results:  Recent Labs    05/11/23 0156 05/12/23 0549  WBC 6.5 5.4  HGB 8.6* 8.8*  HCT 27.9* 27.9*  PLT 196 190   BMET Recent Labs    05/11/23 0156 05/12/23 0549  NA 137 138  K 3.8 3.6  CL 105 104  CO2 24 26  GLUCOSE 104* 126*  BUN 16 12  CREATININE 0.63 0.71  CALCIUM 8.2* 8.7*   PT/INR No results for input(s): "LABPROT", "INR" in the last 72 hours. ABG No results for input(s): "PHART", "HCO3" in the last 72 hours.  Invalid input(s): "PCO2", "PO2"  Studies/Results: ECHOCARDIOGRAM COMPLETE  Result Date: 05/11/2023    ECHOCARDIOGRAM REPORT   Patient Name:   Dakota [name] Date of Exam: 05/11/2023 Medical Rec #:  [id number]    Height:       62.0 in Accession #:    [id number]   Weight:       108.2 lb Date of Birth:  Jul 01, 1926     BSA:          1.473 m Patient Age:    89 years     BP:           117/78 mmHg Patient Gender: M            HR:           80 bpm. Exam Location:  Inpatient Procedure: 2D Echo, Color Doppler and Cardiac Doppler Indications:    CHF  History:        Patient has prior history of Echocardiogram examinations, most                 recent 04/14/2021. CAD, SBO and COPD; Risk Factors:Dyslipidemia                 and Diabetes.  Sonographer:    Milbert Coulter  Referring Phys: [id number] Emeline General IMPRESSIONS  1. Technically difficult study.  2. Left ventricular ejection fraction, by estimation, is 40 to 45%. The left ventricle has mildly decreased function. The left ventricle demonstrates global hypokinesis. There is mild concentric left ventricular hypertrophy. Left ventricular diastolic function could not be evaluated. Significant dyssynchrony due to LBBB.  3. Right ventricular systolic function is normal. The right ventricular size is normal.  4. There is severe calcifcation of the aortic valve. There is severe thickening of the aortic valve. Aortic valve regurgitation is not visualized. The aortic valve appears severly stenotic by visualization. There was limited/incomplete doppler evaluation for aortic stenosis with a mean gradient of .  5. The mitral valve is degenerative. Mild mitral valve regurgitation. Moderate to severe mitral annular calcification. FINDINGS  Left Ventricle: Left ventricular ejection fraction, by estimation, is 40 to 45%. The left ventricle has mildly decreased function. The left ventricle demonstrates global hypokinesis. The left ventricular internal  cavity size was normal in size. There is  mild concentric left ventricular hypertrophy. Abnormal (paradoxical) septal motion, consistent with left bundle branch block. Left ventricular diastolic function could not be evaluated due to nondiagnostic images. Left ventricular diastolic function could not be evaluated. Right Ventricle: The right ventricular size is normal. No increase in right ventricular wall thickness. Right ventricular systolic function is normal. Left Atrium: Left atrial size was normal in size. Right Atrium: Right atrial size was normal in size. Pericardium: There is no evidence of pericardial effusion. Mitral Valve: The mitral valve is degenerative in appearance. There is mild thickening of the mitral valve leaflet(s). There is mild calcification of the mitral valve  leaflet(s). Moderate to severe mitral annular calcification. Mild mitral valve regurgitation. Tricuspid Valve: The tricuspid valve is grossly normal. Tricuspid valve regurgitation is mild. Aortic Valve: The aortic valve is calcified. There is severe calcifcation of the aortic valve. There is severe thickening of the aortic valve. There is moderate aortic valve annular calcification. Aortic valve regurgitation is not visualized. Aortic valve mean gradient measures 5.0 mmHg. Aortic valve peak gradient measures 9.2 mmHg. Aortic valve area, by VTI measures 1.98 cm. Pulmonic Valve: The pulmonic valve was not well visualized. Pulmonic valve regurgitation is trivial. Aorta: The aortic root and ascending aorta are structurally normal, with no evidence of dilitation. IAS/Shunts: No atrial level shunt detected by color flow Doppler.  LEFT VENTRICLE PLAX 2D LVIDd:         4.00 cm   Diastology LVIDs:         3.00 cm   LV e' medial: 7.29 cm/s LV PW:         1.00 cm LV IVS:        1.00 cm LVOT diam:     2.10 cm LV SV:         41 LV SV Index:   28 LVOT Area:     3.46 cm  RIGHT VENTRICLE RV S prime:     6.53 cm/s TAPSE (M-mode): 1.3 cm LEFT ATRIUM             Index        RIGHT ATRIUM           Index LA Vol (A2C):   61.6 ml 41.83 ml/m  RA Area:     25.70 cm LA Vol (A4C):   65.5 ml 44.48 ml/m  RA Volume:   76.70 ml  52.08 ml/m LA Biplane Vol: 67.5 ml 45.84 ml/m  AORTIC VALVE AV Area (Vmax):    1.66 cm AV Area (Vmean):   1.67 cm AV Area (VTI):     1.98 cm AV Vmax:           152.00 cm/s AV Vmean:          97.700 cm/s AV VTI:            0.206 m AV Peak Grad:      9.2 mmHg AV Mean Grad:      5.0 mmHg LVOT Vmax:         72.80 cm/s LVOT Vmean:        47.100 cm/s LVOT VTI:          0.118 m LVOT/AV VTI ratio: 0.57  AORTA Ao Root diam: 3.60 cm Ao Asc diam:  3.00 cm TRICUSPID VALVE TR Peak grad:   35.3 mmHg TR Vmax:        297.00 cm/s  SHUNTS Systemic VTI:  0.12 m Systemic Diam: 2.10 cm  [name] Electronically signed by  [name] Signature Date/Time: 05/11/2023/6:14:34 PM    Final    DG Chest 1 View  Result Date: 05/11/2023 CLINICAL DATA:  Congestive heart failure. EXAM: CHEST  1 VIEW COMPARISON:  May 10, 2023. FINDINGS: Stable cardiomediastinal silhouette. Status post coronary bypass graft. Degenerative changes are seen involving both glenohumeral joints. Minimal left midlung and bibasilar opacities are noted concerning for subsegmental atelectasis or scarring. Small left pleural effusion is noted. IMPRESSION: Minimal left mid lung and bibasilar opacities are noted concerning for subsegmental atelectasis or possibly scarring. Small left pleural effusion is noted. Electronically Signed   By: Lupita Raider M.D.   On: 05/11/2023 08:12   CT ABDOMEN PELVIS W CONTRAST  Result Date: 05/10/2023 CLINICAL DATA:  Right inguinal hernia, epigastric pain EXAM: CT ABDOMEN AND PELVIS WITH CONTRAST TECHNIQUE: Multidetector CT imaging of the abdomen and pelvis was performed using the standard protocol following bolus administration of intravenous contrast. RADIATION DOSE REDUCTION: This exam was performed according to the departmental dose-optimization program which includes automated exposure control, adjustment of the mA and/or kV according to patient size and/or use of iterative reconstruction technique. CONTRAST:  75mL OMNIPAQUE IOHEXOL 350 MG/ML SOLN COMPARISON:  CT chest 07/22/2015 FINDINGS: Lower chest: Small pleural effusions, new since previous, with atelectasis posteriorly at the lung bases. Post median sternotomy. Coronary and aortic atheromatous calcifications. Mitral annulus calcifications. Hepatobiliary: No focal liver abnormality is seen. No gallstones, gallbladder wall thickening, or biliary dilatation. Pancreas: Unremarkable. No pancreatic ductal dilatation or surrounding inflammatory changes. Spleen: Normal in size without focal abnormality. Adrenals/Urinary Tract: She no adrenal mass. Symmetric renal parenchymal  enhancement. Multiple cortical lesions in both kidneys, some of which can be characterized as cysts, largest 2.1 cm 5 HU right upper pole; no followup recommended. No evident urolithiasis or hydronephrosis. Urinary bladder physiologically distended. Stomach/Bowel: Stomach is decompressed. Duodenum is nondistended, unremarkable. Proximal small bowel is decompressed. There are multiple dilated mid small bowel loops. A loop of small bowel is involved in right inguinal hernia, and this appears to be a transition point to decompressed distal small bowel. The colon is incompletely distended, with a few scattered diverticula distally. Vascular/Lymphatic: Aortoiliac calcified atheromatous plaque. Portal vein patent. No abdominal or pelvic adenopathy. Reproductive: Prostate enlargement with central coarse calcifications. Other: Small volume scattered abdominal and pelvic ascites. Fluid extends into bilateral inguinal hernias. Musculoskeletal: Lumbar levoscoliosis with multilevel spondylitic change. Heterogenous mineralization of the bones. No fracture or or discrete lesion. Sternotomy wires. IMPRESSION: 1. Small-bowel obstruction secondary to right inguinal hernia. 2. Small volume ascites. 3. Small pleural effusions. 4.  Aortic Atherosclerosis (ICD10-I70.0). Electronically Signed   By: Corlis Leak M.D.   On: 05/10/2023 11:29   DG Chest Portable 1 View  Result Date: 05/10/2023 CLINICAL DATA:  Cough and chest pain. EXAM: PORTABLE CHEST 1 VIEW COMPARISON:  April 09, 2018 FINDINGS: Enlarged cardiac silhouette. Stable postsurgical changes from CABG. Chronic elevation of the left hemidiaphragm. Hazy airspace opacities throughout both lungs may represent hypoventilatory changes,, mixed pattern pulmonary edema or atypical/viral pneumonia. IMPRESSION: 1. Hazy airspace opacities throughout both lungs may represent hypoventilatory changes, mixed pattern pulmonary edema or atypical/viral pneumonia. 2. Enlarged cardiac silhouette.  Electronically Signed   By: Ted Mcalpine M.D.   On: 05/10/2023 10:57    Anti-infectives: Anti-infectives (From admission, onward)    Start     Dose/Rate Route Frequency Ordered Stop   05/10/23 2200  amoxicillin (AMOXIL) capsule 500 mg        500 mg  Oral Every 12 hours 05/10/23 1925 05/12/23 0913       Assessment/Plan: s/p Procedure(s): HERNIA REPAIR INGUINAL ADULT (Bilateral) The patient appears to have bilateral inguinal hernias, right much larger than left. Because they are both symptomatic he would like to have both of them fixed.  I have discussed with him in detail the risks and benefits of the operation as well as some of the technical aspects and he understands and wishes to proceed.  Cardiology has determined that he is cleared for surgery  LOS: 2 days    Chevis Pretty III 05/12/2023

## 2023-05-12 NOTE — Anesthesia Procedure Notes (Signed)
Procedure Name: Intubation Date/Time: 05/12/2023 11:20 AM  Performed by: Randon Goldsmith, CRNAPre-anesthesia Checklist: Patient identified, Emergency Drugs available, Suction available, Patient being monitored and Timeout performed Patient Re-evaluated:Patient Re-evaluated prior to induction Oxygen Delivery Method: Circle system utilized Preoxygenation: Pre-oxygenation with 100% oxygen Induction Type: IV induction Ventilation: Mask ventilation without difficulty Laryngoscope Size: Mac and 3 Grade View: Grade I Tube type: Oral Tube size: 7.5 mm Number of attempts: 1 Airway Equipment and Method: Stylet Placement Confirmation: ETT inserted through vocal cords under direct vision, positive ETCO2 and breath sounds checked- equal and bilateral Secured at: 23 cm Tube secured with: Tape Dental Injury: Teeth and Oropharynx as per pre-operative assessment

## 2023-05-12 NOTE — Interval H&P Note (Signed)
History and Physical Interval Note:  05/12/2023 10:32 AM  Dakota [name]  has presented today for surgery, with the diagnosis of SBO , BILATERAL INQUINAL HERNIA.  The various methods of treatment have been discussed with the patient and family. After consideration of risks, benefits and other options for treatment, the patient has consented to  Procedure(s): HERNIA REPAIR INGUINAL ADULT (Bilateral) as a surgical intervention.  The patient's history has been reviewed, patient examined, no change in status, stable for surgery.  I have reviewed the patient's chart and labs.  Questions were answered to the patient's satisfaction.     Chevis Pretty III

## 2023-05-12 NOTE — Plan of Care (Signed)

## 2023-05-12 NOTE — Progress Notes (Signed)
[facility] night cross cover note:   I was notified by RN that the patient is complaining of urinary frequency/urgency.  Bladder scan reveals 184 cc.  I placed order for urinalysis.    Dakota Pigg, DO Hospitalist

## 2023-05-12 NOTE — Progress Notes (Signed)
Patient Name: Dakota [name] Date of Encounter: 05/12/2023 [facility] Cardiologist: Nicki Guadalajara, MD   Interval Summary  .    89 y.o. male with a hx of left bundle branch block, persistent atrial fibrillation, combined systolic and diastolic CHF, CAD (s/p CABG in 2005 with LIMA to LAD, SVG to diagonal, SVG to obtuse marginal, and SVG to distal RCA, DES to OM vein graft 2016), essential hypertension, moderate COPD, DM type II, cardiology was consulted 05/10/23 for pre-op risk evaluation for surgical reduction of right inguinal hernia.   Patient states he feels very well, is hard of hearing, numerous family member at bedside, reporting patient is planned for surgery. Family states patient was suspected for TIA in the past but this was not conclusive, never had a stroke. He denied any heart palpitation.    Vital Signs .    Vitals:   05/11/23 1556 05/11/23 1944 05/11/23 2035 05/11/23 2305  BP: 109/79  110/75 133/74  Pulse: 88   79  Resp: (!) 26 18 17 19   Temp: 98.2 F (36.8 C)  (!) 97.5 F (36.4 C) (!) 97.5 F (36.4 C)  TempSrc: Oral  Oral Oral  SpO2: 95%   97%  Weight:        Intake/Output Summary (Last 24 hours) at 05/12/2023 0814 Last data filed at 05/12/2023 0500 Gross per 24 hour  Intake --  Output 401 ml  Net -401 ml      05/11/2023    7:00 AM 03/05/2023    1:34 PM 11/25/2022   10:55 AM  Last 3 Weights  Weight (lbs) 108 lb 3.9 oz 108 lb 3.2 oz 106 lb 11.2 oz  Weight (kg) 49.1 kg 49.079 kg 48.399 kg      Telemetry/ECG    A fib 80s, PVCs occasionally  - Personally Reviewed  Physical Exam .   GEN: No acute distress.   Neck: No JVD Cardiac: Irregularly irregular, systolic murmur RUSB 2+ Respiratory: Clear to auscultation bilaterally. On room air.  GI: Soft, non-distended  MS: No leg edema  Assessment & Plan .     Pre-op risk evaluation for surgical reduction of right inguinal hernia - RCRI score 4, 11% perioperative risk of major cardiac events;  Functional METs <4.  - TTE 05/11/23 showed LVEF 40-45%, global hypokinesis, mild LVH, significant dyssynchrony due to LBBB, normal RV, severe AS (limited doppler evaluation with mean gradient 5 mmHg), mild MR, moderate to severe MAC (Echo from 2022 showed LVEF 35-40%, Mild to moderate aortic valve sclerosis/calcification without AS), will need repeat Echo with enhanced image at outpatient follow up  - given his advanced age and co-morbidity including CAD, DM, COPD, A fib, chronic steroid use, he is at moderate to high risk for planned surgical reduction of inguinal hernia, would close monitor for MI, decompensated CHF, A fib RVR, adrenal insufficiency post-operatively, avoid tachycardia and hypotension, transfuse to keep Hgb >8   Chronic HFrEF  - BNP elevated POA, in the setting of A fib RVR - clinically euvolemic/mildly dehydrated today  - Echo repeated as above - GDMT: continue PTA imdur, losartan, added metoprolol this admission for A fib RVR, PTA Lasix can be held given NPO with anticipated surgery   Persistent A fib  - RVR likely due to acute pain POA - PO metoprolol 25mg  BID added this admission, PRN IV metoprolol for HR >120 sustained  - rate controlled now  - PTA Eliquis 2.5mg  BID held, CHADS2VASc 6, questionable TIA 2020 with CTH at  the time showing mild small vessel chronic ischemic changes of deep cerebral white matter, OK to hold Eliquis, no therapeutic bridge therapy required   CAD with hx of CABG and PCI  - no chest pain - continue medical therapy with imdur, losartan, metoprolol, pravastatin; not on ASA due to use of Eliquis PTA     For questions or updates, please contact [facility] Please consult [email or web address] for contact info under        Signed, Cyndi Bender, NP

## 2023-05-12 NOTE — Progress Notes (Signed)
Subjective/Chief Complaint: No complaints, ready for surgery.   Objective: Vital signs in last 24 hours: Temp:  [97.3 F (36.3 C)-98.2 F (36.8 C)] 97.6 F (36.4 C) (07/17 0826) Pulse Rate:  [79-96] 88 (07/17 0826) Resp:  [17-26] 19 (07/16 2305) BP: (109-133)/(70-85) 129/85 (07/17 0826) SpO2:  [95 %-97 %] 96 % (07/17 0826) Last BM Date : 05/09/23  Intake/Output from previous day: 07/16 0701 - 07/17 0700 In: -  Out: 401 [Urine:400; Stool:1] Intake/Output this shift: No intake/output data recorded.  General appearance: alert and cooperative Resp: clear to auscultation bilaterally Cardio: regular rate and rhythm GI: Soft, nontender.  Bilateral inguinal hernias right greater than left  Lab Results:  Recent Labs    05/11/23 0156 05/12/23 0549  WBC 6.5 5.4  HGB 8.6* 8.8*  HCT 27.9* 27.9*  PLT 196 190   BMET Recent Labs    05/11/23 0156 05/12/23 0549  NA 137 138  K 3.8 3.6  CL 105 104  CO2 24 26  GLUCOSE 104* 126*  BUN 16 12  CREATININE 0.63 0.71  CALCIUM 8.2* 8.7*   PT/INR No results for input(s): "LABPROT", "INR" in the last 72 hours. ABG No results for input(s): "PHART", "HCO3" in the last 72 hours.  Invalid input(s): "PCO2", "PO2"  Studies/Results: ECHOCARDIOGRAM COMPLETE  Result Date: 05/11/2023    ECHOCARDIOGRAM REPORT   Patient Name:   Dakota [name] Date of Exam: 05/11/2023 Medical Rec #:  [id number]    Height:       62.0 in Accession #:    [id number]   Weight:       108.2 lb Date of Birth:  Jul 01, 1926     BSA:          1.473 m Patient Age:    89 years     BP:           117/78 mmHg Patient Gender: M            HR:           80 bpm. Exam Location:  Inpatient Procedure: 2D Echo, Color Doppler and Cardiac Doppler Indications:    CHF  History:        Patient has prior history of Echocardiogram examinations, most                 recent 04/14/2021. CAD, SBO and COPD; Risk Factors:Dyslipidemia                 and Diabetes.  Sonographer:    Milbert Coulter  Referring Phys: [id number] Emeline General IMPRESSIONS  1. Technically difficult study.  2. Left ventricular ejection fraction, by estimation, is 40 to 45%. The left ventricle has mildly decreased function. The left ventricle demonstrates global hypokinesis. There is mild concentric left ventricular hypertrophy. Left ventricular diastolic function could not be evaluated. Significant dyssynchrony due to LBBB.  3. Right ventricular systolic function is normal. The right ventricular size is normal.  4. There is severe calcifcation of the aortic valve. There is severe thickening of the aortic valve. Aortic valve regurgitation is not visualized. The aortic valve appears severly stenotic by visualization. There was limited/incomplete doppler evaluation for aortic stenosis with a mean gradient of .  5. The mitral valve is degenerative. Mild mitral valve regurgitation. Moderate to severe mitral annular calcification. FINDINGS  Left Ventricle: Left ventricular ejection fraction, by estimation, is 40 to 45%. The left ventricle has mildly decreased function. The left ventricle demonstrates global hypokinesis. The left ventricular internal  cavity size was normal in size. There is  mild concentric left ventricular hypertrophy. Abnormal (paradoxical) septal motion, consistent with left bundle branch block. Left ventricular diastolic function could not be evaluated due to nondiagnostic images. Left ventricular diastolic function could not be evaluated. Right Ventricle: The right ventricular size is normal. No increase in right ventricular wall thickness. Right ventricular systolic function is normal. Left Atrium: Left atrial size was normal in size. Right Atrium: Right atrial size was normal in size. Pericardium: There is no evidence of pericardial effusion. Mitral Valve: The mitral valve is degenerative in appearance. There is mild thickening of the mitral valve leaflet(s). There is mild calcification of the mitral valve  leaflet(s). Moderate to severe mitral annular calcification. Mild mitral valve regurgitation. Tricuspid Valve: The tricuspid valve is grossly normal. Tricuspid valve regurgitation is mild. Aortic Valve: The aortic valve is calcified. There is severe calcifcation of the aortic valve. There is severe thickening of the aortic valve. There is moderate aortic valve annular calcification. Aortic valve regurgitation is not visualized. Aortic valve mean gradient measures 5.0 mmHg. Aortic valve peak gradient measures 9.2 mmHg. Aortic valve area, by VTI measures 1.98 cm. Pulmonic Valve: The pulmonic valve was not well visualized. Pulmonic valve regurgitation is trivial. Aorta: The aortic root and ascending aorta are structurally normal, with no evidence of dilitation. IAS/Shunts: No atrial level shunt detected by color flow Doppler.  LEFT VENTRICLE PLAX 2D LVIDd:         4.00 cm   Diastology LVIDs:         3.00 cm   LV e' medial: 7.29 cm/s LV PW:         1.00 cm LV IVS:        1.00 cm LVOT diam:     2.10 cm LV SV:         41 LV SV Index:   28 LVOT Area:     3.46 cm  RIGHT VENTRICLE RV S prime:     6.53 cm/s TAPSE (M-mode): 1.3 cm LEFT ATRIUM             Index        RIGHT ATRIUM           Index LA Vol (A2C):   61.6 ml 41.83 ml/m  RA Area:     25.70 cm LA Vol (A4C):   65.5 ml 44.48 ml/m  RA Volume:   76.70 ml  52.08 ml/m LA Biplane Vol: 67.5 ml 45.84 ml/m  AORTIC VALVE AV Area (Vmax):    1.66 cm AV Area (Vmean):   1.67 cm AV Area (VTI):     1.98 cm AV Vmax:           152.00 cm/s AV Vmean:          97.700 cm/s AV VTI:            0.206 m AV Peak Grad:      9.2 mmHg AV Mean Grad:      5.0 mmHg LVOT Vmax:         72.80 cm/s LVOT Vmean:        47.100 cm/s LVOT VTI:          0.118 m LVOT/AV VTI ratio: 0.57  AORTA Ao Root diam: 3.60 cm Ao Asc diam:  3.00 cm TRICUSPID VALVE TR Peak grad:   35.3 mmHg TR Vmax:        297.00 cm/s  SHUNTS Systemic VTI:  0.12 m Systemic Diam: 2.10 cm  [name] Electronically signed by  [name] Signature Date/Time: 05/11/2023/6:14:34 PM    Final    DG Chest 1 View  Result Date: 05/11/2023 CLINICAL DATA:  Congestive heart failure. EXAM: CHEST  1 VIEW COMPARISON:  May 10, 2023. FINDINGS: Stable cardiomediastinal silhouette. Status post coronary bypass graft. Degenerative changes are seen involving both glenohumeral joints. Minimal left midlung and bibasilar opacities are noted concerning for subsegmental atelectasis or scarring. Small left pleural effusion is noted. IMPRESSION: Minimal left mid lung and bibasilar opacities are noted concerning for subsegmental atelectasis or possibly scarring. Small left pleural effusion is noted. Electronically Signed   By: Lupita Raider M.D.   On: 05/11/2023 08:12   CT ABDOMEN PELVIS W CONTRAST  Result Date: 05/10/2023 CLINICAL DATA:  Right inguinal hernia, epigastric pain EXAM: CT ABDOMEN AND PELVIS WITH CONTRAST TECHNIQUE: Multidetector CT imaging of the abdomen and pelvis was performed using the standard protocol following bolus administration of intravenous contrast. RADIATION DOSE REDUCTION: This exam was performed according to the departmental dose-optimization program which includes automated exposure control, adjustment of the mA and/or kV according to patient size and/or use of iterative reconstruction technique. CONTRAST:  75mL OMNIPAQUE IOHEXOL 350 MG/ML SOLN COMPARISON:  CT chest 07/22/2015 FINDINGS: Lower chest: Small pleural effusions, new since previous, with atelectasis posteriorly at the lung bases. Post median sternotomy. Coronary and aortic atheromatous calcifications. Mitral annulus calcifications. Hepatobiliary: No focal liver abnormality is seen. No gallstones, gallbladder wall thickening, or biliary dilatation. Pancreas: Unremarkable. No pancreatic ductal dilatation or surrounding inflammatory changes. Spleen: Normal in size without focal abnormality. Adrenals/Urinary Tract: She no adrenal mass. Symmetric renal parenchymal  enhancement. Multiple cortical lesions in both kidneys, some of which can be characterized as cysts, largest 2.1 cm 5 HU right upper pole; no followup recommended. No evident urolithiasis or hydronephrosis. Urinary bladder physiologically distended. Stomach/Bowel: Stomach is decompressed. Duodenum is nondistended, unremarkable. Proximal small bowel is decompressed. There are multiple dilated mid small bowel loops. A loop of small bowel is involved in right inguinal hernia, and this appears to be a transition point to decompressed distal small bowel. The colon is incompletely distended, with a few scattered diverticula distally. Vascular/Lymphatic: Aortoiliac calcified atheromatous plaque. Portal vein patent. No abdominal or pelvic adenopathy. Reproductive: Prostate enlargement with central coarse calcifications. Other: Small volume scattered abdominal and pelvic ascites. Fluid extends into bilateral inguinal hernias. Musculoskeletal: Lumbar levoscoliosis with multilevel spondylitic change. Heterogenous mineralization of the bones. No fracture or or discrete lesion. Sternotomy wires. IMPRESSION: 1. Small-bowel obstruction secondary to right inguinal hernia. 2. Small volume ascites. 3. Small pleural effusions. 4.  Aortic Atherosclerosis (ICD10-I70.0). Electronically Signed   By: Corlis Leak M.D.   On: 05/10/2023 11:29   DG Chest Portable 1 View  Result Date: 05/10/2023 CLINICAL DATA:  Cough and chest pain. EXAM: PORTABLE CHEST 1 VIEW COMPARISON:  April 09, 2018 FINDINGS: Enlarged cardiac silhouette. Stable postsurgical changes from CABG. Chronic elevation of the left hemidiaphragm. Hazy airspace opacities throughout both lungs may represent hypoventilatory changes,, mixed pattern pulmonary edema or atypical/viral pneumonia. IMPRESSION: 1. Hazy airspace opacities throughout both lungs may represent hypoventilatory changes, mixed pattern pulmonary edema or atypical/viral pneumonia. 2. Enlarged cardiac silhouette.  Electronically Signed   By: Ted Mcalpine M.D.   On: 05/10/2023 10:57    Anti-infectives: Anti-infectives (From admission, onward)    Start     Dose/Rate Route Frequency Ordered Stop   05/10/23 2200  amoxicillin (AMOXIL) capsule 500 mg        500 mg  Oral Every 12 hours 05/10/23 1925 05/12/23 0913       Assessment/Plan: s/p Procedure(s): HERNIA REPAIR INGUINAL ADULT (Bilateral) The patient appears to have bilateral inguinal hernias, right much larger than left. Because they are both symptomatic he would like to have both of them fixed.  I have discussed with him in detail the risks and benefits of the operation as well as some of the technical aspects and he understands and wishes to proceed.  Cardiology has determined that he is cleared for surgery  LOS: 2 days    Chevis Pretty III 05/12/2023

## 2023-05-12 NOTE — Anesthesia Preprocedure Evaluation (Signed)
Anesthesia Evaluation  Patient identified by MRN, date of birth, ID band Patient awake    Reviewed: Allergy & Precautions, NPO status , Patient's Chart, lab work & pertinent test results  Airway Mallampati: II  TM Distance: >3 FB Neck ROM: Full    Dental  (+) Upper Dentures, Lower Dentures   Pulmonary COPD   Pulmonary exam normal        Cardiovascular hypertension, Pt. on medications + CAD, + Cardiac Stents, + CABG (2005) and +CHF  + dysrhythmias Atrial Fibrillation  Rhythm:Regular Rate:Normal  ECHO 2024:  1. Technically difficult study.  2. Left ventricular ejection fraction, by estimation, is 40 to 45%. The left ventricle has mildly decreased function. The left ventricle demonstrates global hypokinesis. There is mild concentric left ventricular hypertrophy. Left ventricular diastolic function could not be evaluated. Significant dyssynchrony due to LBBB.  3. Right ventricular systolic function is normal. The right ventricular size is normal.  4. There is severe calcifcation of the aortic valve. There is severe thickening of the aortic valve. Aortic valve regurgitation is not visualized. The aortic valve appears severly stenotic by visualization. There was limited/incomplete doppler evaluation for aortic stenosis with a mean gradient of .  5. The mitral valve is degenerative. Mild mitral valve regurgitation. Moderate to severe mitral annular calcification.    Neuro/Psych negative neurological ROS  negative psych ROS   GI/Hepatic Neg liver ROS,GERD  Medicated,,  Endo/Other  diabetes, Type 2, Oral Hypoglycemic Agents    Renal/GU   negative genitourinary   Musculoskeletal  (+) Arthritis , Osteoarthritis,    Abdominal Normal abdominal exam  (+)   Peds  Hematology  (+) Blood dyscrasia, anemia Lab Results      Component                Value               Date                      WBC                      5.4                  05/12/2023                HGB                      8.8 (L)             05/12/2023                HCT                      27.9 (L)            05/12/2023                MCV                      63.0 (L)            05/12/2023                PLT                      190                 05/12/2023  Anesthesia Other Findings   Reproductive/Obstetrics                             Anesthesia Physical Anesthesia Plan  ASA: 3  Anesthesia Plan: General   Post-op Pain Management:    Induction: Intravenous  PONV Risk Score and Plan: 2 and Ondansetron, Dexamethasone and Treatment may vary due to age or medical condition  Airway Management Planned: Mask and LMA  Additional Equipment: None  Intra-op Plan:   Post-operative Plan: Extubation in OR  Informed Consent: I have reviewed the patients History and Physical, chart, labs and discussed the procedure including the risks, benefits and alternatives for the proposed anesthesia with the patient or authorized representative who has indicated his/her understanding and acceptance.     Dental advisory given  Plan Discussed with: CRNA  Anesthesia Plan Comments:        Anesthesia Quick Evaluation

## 2023-05-12 NOTE — Anesthesia Postprocedure Evaluation (Signed)
Anesthesia Post Note  Patient: Dakota [name]  Procedure(s) Performed: HERNIA REPAIR INGUINAL ADULT (Bilateral: Groin) INSERTION OF MESH (Bilateral: Inguinal)     Patient location during evaluation: PACU Anesthesia Type: General Level of consciousness: awake and alert Pain management: pain level controlled Vital Signs Assessment: post-procedure vital signs reviewed and stable Respiratory status: spontaneous breathing, nonlabored ventilation, respiratory function stable and patient connected to nasal cannula oxygen Cardiovascular status: blood pressure returned to baseline and stable Postop Assessment: no apparent nausea or vomiting Anesthetic complications: no   No notable events documented.  Last Vitals:  Vitals:   05/12/23 1415 05/12/23 1430  BP: 131/76 130/81  Pulse: 86 86  Resp: 20 20  Temp:  36.4 C  SpO2: 94% 94%    Last Pain:  Vitals:   05/12/23 1430  TempSrc:   PainSc: Asleep                 Earl Lites P Latorya Bautch

## 2023-05-12 NOTE — Plan of Care (Signed)

## 2023-05-12 NOTE — Op Note (Signed)
05/12/2023  1:18 PM  PATIENT:  Dakota [name]  89 y.o. male  PRE-OPERATIVE DIAGNOSIS:  BILATERAL INGUINAL HERNIAS  POST-OPERATIVE DIAGNOSIS:  BILATERAL INGUINAL HERNIAS  PROCEDURE:  Procedure(s): BILATERAL INGUINAL HERNIA REPAIRS WITH MESH  SURGEON:  Surgeons and Role:    * Griselda Miner, MD - Primary  PHYSICIAN ASSISTANT:   ASSISTANTS: none   ANESTHESIA:   local and general  EBL:  15 mL   BLOOD ADMINISTERED:none  DRAINS: none   LOCAL MEDICATIONS USED:  MARCAINE     SPECIMEN:  No Specimen  DISPOSITION OF SPECIMEN:  N/A  COUNTS:  YES  TOURNIQUET:  * No tourniquets in log *  DICTATION: .Dragon Dictation  After informed consent was obtained the patient was brought to the operating room and placed in the supine position on the operating table.  After adequate induction of general anesthesia the patient's bilateral groin and abdominal areas were prepped with ChloraPrep, allowed to dry, and draped in usual sterile manner.  An appropriate timeout was performed.  Both groins were then infiltrated with quarter percent Marcaine.  Attention was first turned to the left groin.  A small incision was made from the edge of the pubic tubercle on the left towards the anterior superior iliac spine with a 15 blade knife.  The incision was carried through the skin and subcutaneous tissue sharply with the electrocautery until the fascia of the external oblique was encountered.  The external bleak fascia was then opened along its fibers towards the apex of the external ring with a 15 blade knife and Metzenbaum scissors.  This layer was significantly scarred in from a previous surgery.  I was able to identify the cord structures and a small hernia sac.  The hernia sac was gently separated from the cord structures.  The sac was then opened and there were no visceral contents within the sac.  The sac was then ligated at its base with a 3-0 silk suture ligature.  The distal sac was removed and the stump  of the sac was allowed to retract back beneath the transversalis muscle.  Next a 3 x 6 piece of ultra Pro mesh was chosen and cut to the appropriate size.  The mesh was then sewed to the shelving edge of the inguinal ligament and muscular aponeurotic strength of the transversalis with interrupted 2-0 Prolene stitches  Once this was accomplished the mesh appeared to be in good position and the hernia seem well repaired.  I did look for the ilioinguinal nerve during the dissection and did not see it.  The wound was then irrigated with copious amounts of saline.  The external oblique fascia was then reapproximated with a running 2-0 Vicryl stitch.  The subcutaneous fascia was closed with a running 3-0 Vicryl stitch.  The skin was then closed with a running 4-0 Monocryl subcuticular stitch.  Attention was then turned to the right groin.  A similar small incision was made from the edge of the pubic tubercle towards the anterior superior iliac spine with a 15 blade knife.  The incision was carried through the skin and subcutaneous tissue sharply with the electrocautery until the fascia of the external oblique was encountered.  The external bleak fascia was opened along its fibers towards the apex of the external ring with a 15 blade knife and Metzenbaum scissors.  Blunt dissection was carried out of the cord structures until they could be surrounded between 2 fingers.  1/2 inch Penrose drain was placed around the  cord structures for retraction purposes.  There was a large hernia sac which I was able to identify and gently separated from the rest of the cord structures.  The sac was then opened and there were no visceral contents within the sac.  The sac was then ligated at its base with a 2-0 silk suture ligature.  The distal sac was excised and the stump of the sac was allowed to retract back beneath the transversalis muscle.  The floor of the canal was repaired with interrupted 0 Vicryl stitch.  During the dissection I  did look for the ilioinguinal nerve but did not see it.  Next I chose a 3 x 6 piece of ultra Pro mesh and cut it to the appropriate size.  Again the mesh was sewed to the shelving edge of the inguinal ligament and to the muscular aponeurotic strength of the transversalis with interrupted 2-0 Prolene stitches.  Once this was accomplished the mesh appeared to be in good position the hernia seem well repaired.  The wound was irrigated with saline.  The external bleak fascia was then reapproximated with a running 2-0 Vicryl stitch.  The subcutaneous fascia was closed with a running 3-0 Vicryl stitch.  The skin was closed with a running 4-0 Monocryl subcuticular stitch.  Dermabond dressings were applied.  The patient tolerated the procedure well.  At the end of the case all needle sponge and instrument counts were correct.  The patient was then awakened and taken to recovery in stable condition.  PLAN OF CARE: Admit to inpatient   PATIENT DISPOSITION:  PACU - hemodynamically stable.   Delay start of Pharmacological VTE agent (>24hrs) due to surgical blood loss or risk of bleeding: no

## 2023-05-13 ENCOUNTER — Inpatient Hospital Stay (HOSPITAL_COMMUNITY): Payer: Medicare HMO

## 2023-05-13 ENCOUNTER — Encounter (HOSPITAL_COMMUNITY): Payer: Self-pay | Admitting: General Surgery

## 2023-05-13 DIAGNOSIS — I4819 Other persistent atrial fibrillation: Secondary | ICD-10-CM | POA: Diagnosis not present

## 2023-05-13 DIAGNOSIS — I5023 Acute on chronic systolic (congestive) heart failure: Secondary | ICD-10-CM | POA: Diagnosis not present

## 2023-05-13 DIAGNOSIS — K56609 Unspecified intestinal obstruction, unspecified as to partial versus complete obstruction: Secondary | ICD-10-CM | POA: Diagnosis not present

## 2023-05-13 LAB — BASIC METABOLIC PANEL
Anion gap: 7 (ref 5–15)
BUN: 11 mg/dL (ref 8–23)
CO2: 22 mmol/L (ref 22–32)
Calcium: 8.1 mg/dL — ABNORMAL LOW (ref 8.9–10.3)
Chloride: 105 mmol/L (ref 98–111)
Creatinine, Ser: 0.76 mg/dL (ref 0.61–1.24)
GFR, Estimated: >60 mL/min (ref >60)
Glucose, Bld: 137 mg/dL — ABNORMAL HIGH (ref 70–99)
Potassium: 4.3 mmol/L (ref 3.5–5.1)
Sodium: 134 mmol/L — ABNORMAL LOW (ref 135–145)

## 2023-05-13 LAB — CBC WITH DIFFERENTIAL/PLATELET
Abs Immature Granulocytes: 0.02 10*3/uL (ref 0.00–0.07)
Basophils Absolute: 0 10*3/uL (ref 0.0–0.1)
Basophils Relative: 0 %
Eosinophils Absolute: 0 10*3/uL (ref 0.0–0.5)
Eosinophils Relative: 0 %
HCT: 27.3 % — ABNORMAL LOW (ref 39.0–52.0)
Hemoglobin: 8.7 g/dL — ABNORMAL LOW (ref 13.0–17.0)
Immature Granulocytes: 0 %
Lymphocytes Relative: 16 %
Lymphs Abs: 1.4 10*3/uL (ref 0.7–4.0)
MCH: 19.6 pg — ABNORMAL LOW (ref 26.0–34.0)
MCHC: 31.9 g/dL (ref 30.0–36.0)
MCV: 61.6 fL — ABNORMAL LOW (ref 80.0–100.0)
Monocytes Absolute: 0.8 10*3/uL (ref 0.1–1.0)
Monocytes Relative: 10 %
Neutro Abs: 6.5 10*3/uL (ref 1.7–7.7)
Neutrophils Relative %: 74 %
Platelets: 209 10*3/uL (ref 150–400)
RBC: 4.43 MIL/uL (ref 4.22–5.81)
RDW: 15.7 % — ABNORMAL HIGH (ref 11.5–15.5)
WBC: 8.7 10*3/uL (ref 4.0–10.5)
nRBC: 0 % (ref 0.0–0.2)

## 2023-05-13 LAB — BRAIN NATRIURETIC PEPTIDE: B Natriuretic Peptide: 1183.7 pg/mL — ABNORMAL HIGH (ref 0.0–100.0)

## 2023-05-13 LAB — MAGNESIUM: Magnesium: 1.7 mg/dL (ref 1.7–2.4)

## 2023-05-13 LAB — GLUCOSE, CAPILLARY
Glucose-Capillary: 118 mg/dL — ABNORMAL HIGH (ref 70–99)
Glucose-Capillary: 99 mg/dL (ref 70–99)
Glucose-Capillary: 168 mg/dL — ABNORMAL HIGH (ref 70–99)
Glucose-Capillary: 104 mg/dL — ABNORMAL HIGH (ref 70–99)

## 2023-05-13 LAB — C-REACTIVE PROTEIN: CRP: <0.5 mg/dL (ref <1.0)

## 2023-05-13 LAB — PROCALCITONIN: Procalcitonin: <0.1 ng/mL

## 2023-05-13 MED ORDER — ISOSORBIDE MONONITRATE ER 30 MG PO TB24
15.0000 mg | ORAL_TABLET | Freq: Every day | ORAL | Status: DC
  Administered 2023-05-14 – 2023-05-17 (×4): 15 mg via ORAL
  Filled 2023-05-13 (×5): qty 1

## 2023-05-13 MED ORDER — METOPROLOL TARTRATE 25 MG PO TABS
25.0000 mg | ORAL_TABLET | Freq: Two times a day (BID) | ORAL | Status: DC
  Administered 2023-05-14: 25 mg via ORAL
  Filled 2023-05-13 (×2): qty 1

## 2023-05-13 MED ORDER — OXYCODONE HCL 5 MG PO TABS: 2.5000 mg | ORAL_TABLET | ORAL | Status: DC | PRN

## 2023-05-13 MED ORDER — LACTATED RINGERS IV SOLN: INTRAVENOUS | Status: AC

## 2023-05-13 MED ORDER — APIXABAN 2.5 MG PO TABS
2.5000 mg | ORAL_TABLET | Freq: Two times a day (BID) | ORAL | Status: DC
  Administered 2023-05-13 – 2023-05-15 (×5): 2.5 mg via ORAL
  Filled 2023-05-13 (×5): qty 1

## 2023-05-13 MED ORDER — ACETAMINOPHEN 325 MG PO TABS: 650.0000 mg | ORAL_TABLET | Freq: Four times a day (QID) | ORAL | Status: DC | PRN

## 2023-05-13 NOTE — Progress Notes (Signed)
Rounding Note    Patient Name: Dakota [name] Date of Encounter: 05/13/2023  [facility] Cardiologist: Nicki Guadalajara, MD   Subjective   Dakota [name] is doing ok post-op. Son is by the bedside  Inpatient Medications    Scheduled Meds:  apixaban  2.5 mg Oral BID   finasteride  5 mg Oral QHS   insulin aspart  0-9 Units Subcutaneous Q6H   isosorbide mononitrate  15 mg Oral Daily   ketorolac  1 drop Both Eyes QID   metoprolol tartrate  25 mg Oral BID   mirabegron ER  50 mg Oral QHS   pantoprazole (PROTONIX) IV  40 mg Intravenous Q24H   pravastatin  80 mg Oral QHS   prednisoLONE acetate  1 drop Left Eye QID   Continuous Infusions:  lactated ringers 100 mL/hr at 05/13/23 1004   PRN Meds: acetaminophen, albuterol, metoprolol tartrate, nitroGLYCERIN, [DISCONTINUED] ondansetron **OR** ondansetron (ZOFRAN) IV, oxyCODONE   Vital Signs    Vitals:   05/13/23 0000 05/13/23 0343 05/13/23 0626 05/13/23 0930  BP: 109/70 108/65 99/65 (!) 83/45  Pulse: 82 79 84   Resp: 18 18 20    Temp: 97.7 F (36.5 C) 97.7 F (36.5 C) 98 F (36.7 C) 97.7 F (36.5 C)  TempSrc: Oral Oral Oral Oral  SpO2: 96% 96% 94%   Weight:        Intake/Output Summary (Last 24 hours) at 05/13/2023 1146 Last data filed at 05/13/2023 0100 Gross per 24 hour  Intake 900 ml  Output 440 ml  Net 460 ml      05/11/2023    7:00 AM 03/05/2023    1:34 PM 11/25/2022   10:55 AM  Last 3 Weights  Weight (lbs) 108 lb 3.9 oz 108 lb 3.2 oz 106 lb 11.2 oz  Weight (kg) 49.1 kg 49.079 kg 48.399 kg      Telemetry    Afib, rate controlled - Personally Reviewed  ECG    No new - Personally Reviewed  Physical Exam   Vitals:   05/13/23 0626 05/13/23 0930  BP: 99/65 (!) 83/45  Pulse: 84   Resp: 20   Temp: 98 F (36.7 C) 97.7 F (36.5 C)  SpO2: 94%     GEN: No acute distress.   Neck: + JVD Cardiac: IRRR, no murmurs, rubs, or gallops.  Respiratory: Clear to auscultation bilaterally. GI: Soft, nontender,  non-distended  MS: No edema; No deformity. Neuro:  Nonfocal  Psych: Normal affect   Labs    High Sensitivity Troponin:   Recent Labs  Lab 05/10/23 0918 05/10/23 1203  TROPONINIHS 7 9     Chemistry Recent Labs  Lab 05/10/23 0918 05/10/23 1634 05/11/23 0156 05/12/23 0549 05/13/23 0331  NA 138  --  137 138 134*  K 4.6  --  3.8 3.6 4.3  CL 98  --  105 104 105  CO2 21*  --  24 26 22   GLUCOSE 194*  --  104* 126* 137*  BUN 23  --  16 12 11   CREATININE 0.81  --  0.63 0.71 0.76  CALCIUM 9.1  --  8.2* 8.7* 8.1*  MG  --  1.9  --  1.9 1.7  PROT 6.7  --   --   --   --   ALBUMIN 3.9  --   --   --   --   AST 30  --   --   --   --   ALT 14  --   --   --   --  ALKPHOS 38  --   --   --   --   BILITOT 1.2  --   --   --   --   GFRNONAA >60  --  >60 >60 >60  ANIONGAP 19*  --  8 8 7     Lipids No results for input(s): "CHOL", "TRIG", "HDL", "LABVLDL", "LDLCALC", "CHOLHDL" in the last 168 hours.  Hematology Recent Labs  Lab 05/11/23 0156 05/12/23 0549 05/13/23 0331  WBC 6.5 5.4 8.7  RBC 4.49 4.43 4.43  HGB 8.6* 8.8* 8.7*  HCT 27.9* 27.9* 27.3*  MCV 62.1* 63.0* 61.6*  MCH 19.2* 19.9* 19.6*  MCHC 30.8 31.5 31.9  RDW 15.9* 15.8* 15.7*  PLT 196 190 209   Thyroid  Recent Labs  Lab 05/11/23 0655  TSH 0.922    BNP Recent Labs  Lab 05/11/23 0655 05/12/23 0549 05/13/23 0331  BNP 824.9* 826.8* 1,183.7*    DDimer No results for input(s): "DDIMER" in the last 168 hours.   Radiology    DG Chest Port 1 View  Result Date: 05/13/2023 CLINICAL DATA:  Shortness of breath. EXAM: PORTABLE CHEST 1 VIEW COMPARISON:  05/11/2023 FINDINGS: The cardio pericardial silhouette is enlarged. Interval slight increase in left base atelectasis with probable small left pleural effusion. Interstitial markings are diffusely coarsened with chronic features. Bones are diffusely demineralized. Telemetry leads overlie the chest. IMPRESSION: Interval slight increase in left base atelectasis with probable  small left pleural effusion. Electronically Signed   By: Kennith Center M.D.   On: 05/13/2023 06:53   ECHOCARDIOGRAM COMPLETE  Result Date: 05/11/2023    ECHOCARDIOGRAM REPORT   Patient Name:   Dakota [name] Date of Exam: 05/11/2023 Medical Rec #:  [id number]    Height:       62.0 in Accession #:    [id number]   Weight:       108.2 lb Date of Birth:  07/19/1926     BSA:          1.473 m Patient Age:    89 years     BP:           117/78 mmHg Patient Gender: M            HR:           80 bpm. Exam Location:  Inpatient Procedure: 2D Echo, Color Doppler and Cardiac Doppler Indications:    CHF  History:        Patient has prior history of Echocardiogram examinations, most                 recent 04/14/2021. CAD, SBO and COPD; Risk Factors:Dyslipidemia                 and Diabetes.  Sonographer:    Milbert Coulter Referring Phys: [id number] Emeline General IMPRESSIONS  1. Technically difficult study.  2. Left ventricular ejection fraction, by estimation, is 40 to 45%. The left ventricle has mildly decreased function. The left ventricle demonstrates global hypokinesis. There is mild concentric left ventricular hypertrophy. Left ventricular diastolic function could not be evaluated. Significant dyssynchrony due to LBBB.  3. Right ventricular systolic function is normal. The right ventricular size is normal.  4. There is severe calcifcation of the aortic valve. There is severe thickening of the aortic valve. Aortic valve regurgitation is not visualized. The aortic valve appears severly stenotic by visualization. There was limited/incomplete doppler evaluation for aortic stenosis with a mean gradient of .  5. The mitral  valve is degenerative. Mild mitral valve regurgitation. Moderate to severe mitral annular calcification. FINDINGS  Left Ventricle: Left ventricular ejection fraction, by estimation, is 40 to 45%. The left ventricle has mildly decreased function. The left ventricle demonstrates global hypokinesis. The left ventricular  internal cavity size was normal in size. There is  mild concentric left ventricular hypertrophy. Abnormal (paradoxical) septal motion, consistent with left bundle Dakota [name] block. Left ventricular diastolic function could not be evaluated due to nondiagnostic images. Left ventricular diastolic function could not be evaluated. Right Ventricle: The right ventricular size is normal. No increase in right ventricular wall thickness. Right ventricular systolic function is normal. Left Atrium: Left atrial size was normal in size. Right Atrium: Right atrial size was normal in size. Pericardium: There is no evidence of pericardial effusion. Mitral Valve: The mitral valve is degenerative in appearance. There is mild thickening of the mitral valve leaflet(s). There is mild calcification of the mitral valve leaflet(s). Moderate to severe mitral annular calcification. Mild mitral valve regurgitation. Tricuspid Valve: The tricuspid valve is grossly normal. Tricuspid valve regurgitation is mild. Aortic Valve: The aortic valve is calcified. There is severe calcifcation of the aortic valve. There is severe thickening of the aortic valve. There is moderate aortic valve annular calcification. Aortic valve regurgitation is not visualized. Aortic valve mean gradient measures 5.0 mmHg. Aortic valve peak gradient measures 9.2 mmHg. Aortic valve area, by VTI measures 1.98 cm. Pulmonic Valve: The pulmonic valve was not well visualized. Pulmonic valve regurgitation is trivial. Aorta: The aortic root and ascending aorta are structurally normal, with no evidence of dilitation. IAS/Shunts: No atrial level shunt detected by color flow Doppler.  LEFT VENTRICLE PLAX 2D LVIDd:         4.00 cm   Diastology LVIDs:         3.00 cm   LV e' medial: 7.29 cm/s LV PW:         1.00 cm LV IVS:        1.00 cm LVOT diam:     2.10 cm LV SV:         41 LV SV Index:   28 LVOT Area:     3.46 cm  RIGHT VENTRICLE RV S prime:     6.53 cm/s TAPSE (M-mode): 1.3 cm LEFT  ATRIUM             Index        RIGHT ATRIUM           Index LA Vol (A2C):   61.6 ml 41.83 ml/m  RA Area:     25.70 cm LA Vol (A4C):   65.5 ml 44.48 ml/m  RA Volume:   76.70 ml  52.08 ml/m LA Biplane Vol: 67.5 ml 45.84 ml/m  AORTIC VALVE AV Area (Vmax):    1.66 cm AV Area (Vmean):   1.67 cm AV Area (VTI):     1.98 cm AV Vmax:           152.00 cm/s AV Vmean:          97.700 cm/s AV VTI:            0.206 m AV Peak Grad:      9.2 mmHg AV Mean Grad:      5.0 mmHg LVOT Vmax:         72.80 cm/s LVOT Vmean:        47.100 cm/s LVOT VTI:          0.118 m LVOT/AV VTI ratio:  0.57  AORTA Ao Root diam: 3.60 cm Ao Asc diam:  3.00 cm TRICUSPID VALVE TR Peak grad:   35.3 mmHg TR Vmax:        297.00 cm/s  SHUNTS Systemic VTI:  0.12 m Systemic Diam: 2.10 cm Aditya Sabharwal Electronically signed by [name] Signature Date/Time: 05/11/2023/6:14:34 PM    Final     Cardiac Studies   TTE 05/11/2023 1. Technically difficult study.   2. Left ventricular ejection fraction, by estimation, is 40 to 45%. The left ventricle has mildly decreased function. The left ventricle  demonstrates global hypokinesis. There is mild concentric left ventricular hypertrophy. Left ventricular diastolic function could not be evaluated. Significant dyssynchrony due to LBBB.   3. Right ventricular systolic function is normal. The right ventricular  size is normal.   4. There is severe calcifcation of the aortic valve. There is severe thickening of the aortic valve. Aortic valve regurgitation is not visualized. The aortic valve appears severly stenotic by visualization. There was limited/incomplete doppler  evaluation for aortic stenosis with a mean gradient of .   5. The mitral valve is degenerative. Mild mitral valve regurgitation.  Moderate to severe mitral annular calcification.    Patient Profile     89 y.o. male with a hx of left bundle Eliette Drumwright block, persistent atrial fibrillation, combined systolic and diastolic CHF, CAD  (s/p CABG in 2005 with LIMA to LAD, SVG to diagonal, SVG to obtuse marginal, and SVG to distal RCA, DES to OM vein graft 2016), essential hypertension, moderate COPD, DM type II, cardiology was consulted 05/10/23 for pre-op risk evaluation for surgical reduction of right inguinal hernia.   Assessment & Plan    Chronic HFrEF  - would be ginger about giving fluids. Mild JVD. EF stable otherwise - GDMT: continue PTA imdur, losartan, cont.  metoprolol started this admission for A fib RVR - can restart home lasix when Bps improve  HTN - hold home BP meds with hypotension   Persistent A fib  - RVR likely due to acute pain POA - PO metoprolol 25mg  BID added this admission, PRN IV metoprolol for HR >120 sustained  - rate controlled now  - continue Eliquis 2.5mg  BID -  CHADS2VASc 6, questionable TIA 2020 with CTH at the time showing mild small vessel chronic ischemic changes of deep cerebral white matter   CAD with hx of CABG and PCI  - no chest pain - continue medical therapy with imdur, losartan, metoprolol, pravastatin; not on ASA due to use of Eliquis PTA    For questions or updates, please contact [facility] Please consult [email or web address] for contact info under        Signed, Maisie Fus, MD  05/13/2023, 11:46 AM

## 2023-05-13 NOTE — Progress Notes (Signed)
Physical Therapy Treatment Patient Details Name: Dakota [name] MRN: [id number] DOB: Sep 04, 1926 Today's Date: 05/13/2023   History of Present Illness 89 y.o. male presented 05/10/23 with recurrent nonreducible right hernia and abdominal pain. Attempting medical management. Now s/p bil inguinal hernia repairs 7/17. PMH significant of chronic right inguinal hernia, PAF on Eliquis, CAD status post CABG x 4, chronic HFrEF LVEF 30-50%, HTN, IIDM,    PT Comments  Patient still up in chair on arrival. Pt/family report he just walked in hallway <1 hour ago. Pt requesting to go to bathroom and back to bed. BP 103/78. Pt  moves slowly, however did not need physical assist for sit to stand or for ambulation with rollator. Required min assist to raise LEs up onto bed as he laid down sideways to protect surgical sites.      Assistance Recommended at Discharge Frequent or constant Supervision/Assistance  If plan is discharge home, recommend the following:  Can travel by private vehicle    A little help with walking and/or transfers;Assistance with cooking/housework;Assist for transportation;Help with stairs or ramp for entrance      Equipment Recommendations  None recommended by PT    Recommendations for Other Services       Precautions / Restrictions Precautions Precautions: Fall Restrictions Weight Bearing Restrictions: No     Mobility  Bed Mobility Overal bed mobility: Needs Assistance Bed Mobility: Sit to Sidelying         Sit to sidelying: Min assist General bed mobility comments: cues for sequencing and light assist to raise legs onto bed    Transfers Overall transfer level: Needs assistance Equipment used: Rolling walker (2 wheels) Transfers: Sit to/from Stand Sit to Stand: Min guard           General transfer comment: no physical assist; pt slowly rises    Ambulation/Gait Ambulation/Gait assistance: Min guard, Supervision Gait Distance (Feet): 20 Feet (toileted;  12) Assistive device: Rollator (4 wheels) Gait Pattern/deviations: Step-through pattern, Decreased stride length, Trunk flexed   Gait velocity interpretation: 1.31 - 2.62 ft/sec, indicative of limited community ambulator   General Gait Details: pt manuevering around obstacles without assist; required cues for which way to turn to keep from getting wrapped up in lines/IV; pt refused to walk with PT beside him "you lead the way.Marland KitchenMarland KitchenI'm fine" Pt/family report it has been <1 hour since he walked down the hall.   Stairs Stairs:  (family asking/concerned about how pt will do going up the outside steps; anticipate this will not be a problem, however will practice 7/19)           Wheelchair Mobility     Tilt Bed    Modified Rankin (Stroke Patients Only)       Balance Overall balance assessment: Needs assistance Sitting-balance support: No upper extremity supported, Feet supported Sitting balance-Leahy Scale: Fair     Standing balance support: No upper extremity supported, During functional activity Standing balance-Leahy Scale: Fair Standing balance comment: stood and gathered his gown prior to sitting on toilet                            Cognition Arousal/Alertness: Awake/alert Behavior During Therapy: WFL for tasks assessed/performed Overall Cognitive Status: Impaired/Different from baseline                                 General Comments: following commands without need for  repetition; HOH occasionally a barrier        Exercises General Exercises - Lower Extremity Ankle Circles/Pumps: AAROM, Both, 10 reps, Supine Quad Sets: AROM, Both, 10 reps, Supine    General Comments General comments (skin integrity, edema, etc.): BP 103/78 prior to ambulation; denied symptoms while walking      Pertinent Vitals/Pain Pain Assessment Pain Assessment: Faces Faces Pain Scale: Hurts a little bit Pain Location: stomach Pain Descriptors / Indicators:  Sore Pain Intervention(s): Limited activity within patient's tolerance, Monitored during session, Repositioned    Home Living                          Prior Function            PT Goals (current goals can now be found in the care plan section) Acute Rehab PT Goals Patient Stated Goal: return home Time For Goal Achievement: 05/27/23 Potential to Achieve Goals: Good Progress towards PT goals: Progressing toward goals    Frequency    Min 3X/week      PT Plan Discharge plan needs to be updated    Co-evaluation              AM-PAC PT "6 Clicks" Mobility   Outcome Measure  Help needed turning from your back to your side while in a flat bed without using bedrails?: None Help needed moving from lying on your back to sitting on the side of a flat bed without using bedrails?: A Little Help needed moving to and from a bed to a chair (including a wheelchair)?: A Little Help needed standing up from a chair using your arms (e.g., wheelchair or bedside chair)?: A Little Help needed to walk in hospital room?: A Little Help needed climbing 3-5 steps with a railing? : A Little 6 Click Score: 19    End of Session Equipment Utilized During Treatment: Gait belt Activity Tolerance: Patient tolerated treatment well Patient left: with family/visitor present;in bed;with call bell/phone within reach;with bed alarm set   PT Visit Diagnosis: Difficulty in walking, not elsewhere classified (R26.2)     Time: 7564-3329 PT Time Calculation (min) (ACUTE ONLY): 24 min  Charges:    $Gait Training: 8-22 mins PT General Charges $$ ACUTE PT VISIT: 1 Visit                      Jerolyn Center, PT Acute Rehabilitation Services  Office [phone]    Zena Amos 05/13/2023, 4:07 PM

## 2023-05-13 NOTE — Care Management Important Message (Signed)
Important Message  Patient Details  Name: Dakota [name] MRN: [id number] Date of Birth: [birth year]   Medicare Important Message Given:  Yes     Ashiya Kinkead Stefan Church 05/13/2023, 3:41 PM

## 2023-05-13 NOTE — Progress Notes (Signed)
Occupational Therapy Treatment Patient Details Name: Dakota [name] MRN: [id number] DOB: 1926-09-15 Today's Date: 05/13/2023   History of present illness 89 y.o. male presented 05/10/23 with recurrent nonreducible right hernia and abdominal pain. Attempting medical management. Now s/p bil inguinal hernia repairs 7/17. PMH significant of chronic right inguinal hernia, PAF on Eliquis, CAD status post CABG x 4, chronic HFrEF LVEF 30-50%, HTN, IIDM,   OT comments  Pt progressing slowly toward established OT goals. Now s/p hernia repairs. Pt with slowed cognitive processing this session and requiring cues for safety and mobility. Following all commands with increased time. Pt required min A for rise from EOB, but min guard with functional mobility to chair. Pt repositioned and self feeding. Family present with max questions regarding return home and PLOF. Education provided regarding surgery recovery and compensatory techniques for ADL to reduce pain. Will continue to follow.    Recommendations for follow up therapy are one component of a multi-disciplinary discharge planning process, led by the attending physician.  Recommendations may be updated based on patient status, additional functional criteria and insurance authorization.    Assistance Recommended at Discharge Set up Supervision/Assistance  Patient can return home with the following  A lot of help with walking and/or transfers;A lot of help with bathing/dressing/bathroom   Equipment Recommendations  Other (comment) (transport chair)    Recommendations for Other Services      Precautions / Restrictions Precautions Precautions: Fall Restrictions Weight Bearing Restrictions: No       Mobility Bed Mobility Overal bed mobility: Needs Assistance Bed Mobility: Supine to Sit     Supine to sit: Min assist, HOB elevated     General bed mobility comments: cues for sequencing and light assist to elevate trunk    Transfers Overall  transfer level: Needs assistance Equipment used: Rolling walker (2 wheels) Transfers: Sit to/from Stand Sit to Stand: Min assist           General transfer comment: Min A for boost up from EOB and cues for safe hand placement     Balance Overall balance assessment: Needs assistance Sitting-balance support: No upper extremity supported, Feet supported Sitting balance-Leahy Scale: Fair     Standing balance support: Bilateral upper extremity supported, Reliant on assistive device for balance Standing balance-Leahy Scale: Poor                             ADL either performed or assessed with clinical judgement   ADL Overall ADL's : Needs assistance/impaired Eating/Feeding: Set up;Sitting Eating/Feeding Details (indicate cue type and reason): Pt with difficulty self-feeding on arrival due to poor positioning. Moved to chair and repositioned pt ergonomically as well as adjusted height and placement of tray table. pt then able to self feed Grooming: Wash/dry face;Set up;Sitting Grooming Details (indicate cue type and reason): at EOB, pt requiring supervision for safety due to mild imbalance and notably decr core strength.                 Toilet Transfer: Minimal assistance;Rolling walker (2 wheels);Ambulation Toilet Transfer Details (indicate cue type and reason): simulated in room         Functional mobility during ADLs: Minimal assistance;Rolling walker (2 wheels)      Extremity/Trunk Assessment Upper Extremity Assessment Upper Extremity Assessment: RUE deficits/detail;Generalized weakness RUE Deficits / Details: hx of shoulder injury   Lower Extremity Assessment Lower Extremity Assessment: Generalized weakness   Cervical / Trunk Assessment Cervical /  Trunk Assessment: Kyphotic    Vision   Additional Comments: Pt unable to read remote buttons and with decresaed vision to read therapist name badge etc. believe this is his baseline   Perception      Praxis      Cognition Arousal/Alertness: Awake/alert Behavior During Therapy: WFL for tasks assessed/performed Overall Cognitive Status: Impaired/Different from baseline Area of Impairment: Following commands, Awareness, Problem solving, Memory                     Memory: Decreased short-term memory Following Commands: Follows one step commands consistently, Follows one step commands with increased time   Awareness: Intellectual Problem Solving: Slow processing, Requires verbal cues, Difficulty sequencing General Comments: Requies increased time and instructional cues for following commands and sequencing mobility        Exercises      Shoulder Instructions       General Comments VSS during session    Pertinent Vitals/ Pain       Pain Assessment Pain Assessment: Faces Faces Pain Scale: Hurts a little bit Pain Location: stomach Pain Descriptors / Indicators: Sore Pain Intervention(s): Limited activity within patient's tolerance, Monitored during session  Home Living Family/patient expects to be discharged to:: Private residence Living Arrangements: Children Available Help at Discharge: Family;Available 24 hours/day Type of Home: House Home Access: Stairs to enter [employer] of Steps: 4 Entrance Stairs-Rails: Right Home Layout: One level     Bathroom Shower/Tub: Producer, television/film/video: Standard     Home Equipment: Tub bench;Cane - single Librarian, academic (2 wheels)   Additional Comments: lives with daughter who had a recent foot surgery nwb 8 weeks. family visiting from [place] with daughter able to extend stay past august 2024 as needed. family is renting a house at present with patient living there. pt normally lives at apartment with elevator      Prior Functioning/Environment              Frequency  Min 2X/week        Progress Toward Goals  OT Goals(current goals can now be found in the care plan  section)  Progress towards OT goals: Progressing toward goals  Acute Rehab OT Goals Patient Stated Goal: get better OT Goal Formulation: With patient/family Time For Goal Achievement: 05/25/23 Potential to Achieve Goals: Good  Plan Discharge plan remains appropriate;Frequency remains appropriate    Co-evaluation                 AM-PAC OT "6 Clicks" Daily Activity     Outcome Measure   Help from another person eating meals?: A Little Help from another person taking care of personal grooming?: A Little Help from another person toileting, which includes using toliet, bedpan, or urinal?: A Little Help from another person bathing (including washing, rinsing, drying)?: A Little Help from another person to put on and taking off regular upper body clothing?: A Little Help from another person to put on and taking off regular lower body clothing?: A Little 6 Click Score: 18    End of Session Equipment Utilized During Treatment: Rolling walker (2 wheels)  OT Visit Diagnosis: Unsteadiness on feet (R26.81);Muscle weakness (generalized) (M62.81)   Activity Tolerance Patient tolerated treatment well   Patient Left in chair;with call bell/phone within reach;with family/visitor present;with chair alarm set   Nurse Communication Mobility status;Precautions        Time: 1610-9604 OT Time Calculation (min): 49 min  Charges: OT General Charges $  OT Visit: 1 Visit OT Treatments $Self Care/Home Management : 38-52 mins  Tyler Deis, OTR/L [facility] Acute Rehabilitation Office: [phone]   Myrla Halsted 05/13/2023, 10:51 AM

## 2023-05-13 NOTE — Progress Notes (Signed)
ANTICOAGULATION CONSULT NOTE - Initial Consult  Pharmacy Consult for : Eliquis  (on PTA) Indication:  Nonvalvular Atrial Fibrillation   Allergies  Allergen Reactions   Aceon [Perindopril Erbumine] Shortness Of Breath and Cough   Sulfa Antibiotics Swelling   Pork-Derived Products     muslim    Patient Measurements: Weight: 49.1 kg (108 lb 3.9 oz) Height: 5 ft, 2in. (62 in)  Vital Signs: Temp: 97.3 F (36.3 C) (07/18 1208) Temp Source: Axillary (07/18 1208) BP: 95/61 (07/18 1208) Pulse Rate: 84 (07/18 0626)  Labs: Recent Labs    05/11/23 0156 05/12/23 0549 05/13/23 0331  HGB 8.6* 8.8* 8.7*  HCT 27.9* 27.9* 27.3*  PLT 196 190 209  CREATININE 0.63 0.71 0.76    Estimated Creatinine Clearance: 38.4 mL/min (by C-G formula based on SCr of 0.76 mg/dL).   Medical History: Past Medical History:  Diagnosis Date   Aortic atherosclerosis (HCC)    Arthritis    Benign localized prostatic hyperplasia with lower urinary tract symptoms (LUTS)    Chronic chest pain    takes imdur   Chronic combined systolic (congestive) and diastolic (congestive) heart failure (HCC)    followed by cardiology   Chronic dyspnea    Cognitive impairment    COPD, moderate (HCC)    Edema of both lower extremities    takes lasix and uses teds   First degree heart block    GERD (gastroesophageal reflux disease)    Heart murmur    History of cardiovascular stress test    Lexiscan Myoview 7/16:  EF 51%, inferior, inferolateral ischemia; Intermediate Risk   History of smallpox    HOH (hard of hearing)    even with hearing aids   Hypertension    Ischemic heart disease 2005   Dr Tresa Endo a. s/p CABG;  b.  LHC 05/23/15:  S-RPAVB ok, S-D2 ok, S-OM2 99% (s/p Synergy DES), L-LAD ok, EF normal (complicated by pseudoaneurysm)    LBBB (left bundle branch block)    Right arm weakness    S/P CABG x 4    01-08-2004   S/P drug eluting coronary stent placement    05-21-2015  PCI and DES x1 to SVG--OM graft    Thalassemia minor    Type 2 diabetes, diet controlled (HCC)    Wears glasses    Wears hearing aid in both ears     Medications:  Facility-Administered Medications Prior to Admission  Medication Dose Route Frequency Provider Last Rate Last Admin   SOLUTION-PLUS STOPCOCK MISC   Does not apply Once Runell Gess, MD       Medications Prior to Admission  Medication Sig Dispense Refill Last Dose   albuterol (PROVENTIL HFA;VENTOLIN HFA) 108 (90 Base) MCG/ACT inhaler Inhale 2 puffs into the lungs every 4 (four) hours as needed for wheezing or shortness of breath. 1 Inhaler 0 Past Week   apixaban (ELIQUIS) 2.5 MG TABS tablet Take 1 tablet (2.5 mg total) by mouth 2 (two) times daily. 60 tablet 5 05/09/2023 at 1940   docusate sodium (COLACE) 100 MG capsule Take 100 mg by mouth at bedtime.   05/09/2023   finasteride (PROSCAR) 5 MG tablet Take 5 mg by mouth at bedtime.    05/09/2023   isosorbide mononitrate (IMDUR) 30 MG 24 hr tablet Take 1 tablet by mouth once daily 90 tablet 3 05/09/2023   ketorolac (ACULAR) 0.5 % ophthalmic solution Place 1 drop into the left eye 4 (four) times daily.   05/10/2023   losartan (  COZAAR) 25 MG tablet Take 1 tablet by mouth once daily 90 tablet 3 05/09/2023   metFORMIN (GLUCOPHAGE) 500 MG tablet Take 500 mg by mouth 2 (two) times daily.   05/09/2023   mirabegron ER (MYRBETRIQ) 50 MG TB24 tablet Take 50 mg by mouth at bedtime.    05/09/2023   Multiple Vitamin (MULTIVITAMIN WITH MINERALS) TABS tablet Take 1 tablet by mouth daily.   05/09/2023   Multiple Vitamins-Minerals (PRESERVISION AREDS 2 PO) Take 1 tablet by mouth 2 (two) times daily.    05/09/2023   NITROSTAT 0.4 MG SL tablet DISSOLVE ONE TABLET UNDER THE TONGUE EVERY 5 MINUTES AS NEEDED FOR CHEST PAIN. (Patient taking differently: Place 0.4 mg under the tongue every 5 (five) minutes as needed for chest pain.) 25 tablet 2 unk   pravastatin (PRAVACHOL) 80 MG tablet Take 80 mg by mouth at bedtime.   05/09/2023   prednisoLONE  acetate (PRED FORTE) 1 % ophthalmic suspension Place 1 drop into the left eye 4 (four) times daily.   05/10/2023   Blood Glucose Monitoring Suppl (ONE TOUCH ULTRA 2) w/Device KIT daily in the afternoon.      furosemide (LASIX) 20 MG tablet 20 MG EVERY 4 DAYS (Patient taking differently: Take 20 mg by mouth See admin instructions. 20 MG EVERY 4 DAYS) 12 tablet 3    ONE TOUCH ULTRA TEST test strip 1 each by Other route as needed (glucose monoring).       pantoprazole (PROTONIX) 40 MG tablet Take 1 tablet (40 mg total) by mouth daily. (Patient taking differently: Take 40 mg by mouth daily.) 90 tablet 3     Assessment: 89 y.o male on Eliquis prior to admission for h/o Atrial fibrillation.  Eliquis was held due surgery needed.  S/p hernia surgery 7/17 >>  POD#1 7/18 resumed A[ixaban 2.5 mg BID .  No bleeding reported.  Hgb stable at 8.7 and pltc wnl /stable.    Goal of Therapy:  Stroke prevention Monitor platelets by anticoagulation protocol: Yes   Plan:  Resumed apixaban 2.5mg  BID Recommend to monitor for s/sx of bleeding while on anticoagulation Pharmacy will sign off  Thank you for allowing pharmacy to be part of this patients care team. Noah Delaine, RPh Clinical Pharmacist 05/13/2023,2:22 PM [phone] Please check AMION for all [facility] Pharmacy phone numbers After 10:00 PM, call Main Pharmacy [phone]

## 2023-05-13 NOTE — Evaluation (Addendum)
Physical Therapy Evaluation Patient Details Name: Dakota [name] MRN: [id number] DOB: August 12, 1926 Today's Date: 05/13/2023  History of Present Illness  89 y.o. male presented 05/10/23 with recurrent nonreducible right hernia and abdominal pain. Attempting medical management.   PMH significant of chronic right inguinal hernia, PAF on Eliquis, CAD status post CABG x 4, chronic HFrEF LVEF 30-50%, HTN, IIDM,  Clinical Impression   Pt admitted secondary to problem above with deficits below. PTA patient was walking modified independent with rollator and required minguard on stairs to enter home.  Pt currently requires increased assist due to surgery and currently with hypotension preventing attempts to ambulate (70/55 supine in recliner). RN aware and in to assess pt. She plans to give pt fluids and if BP improves and PT has time will return in pm for incr activity.  Anticipate patient will benefit from PT to address problems listed below.Will continue to follow acutely to maximize functional mobility independence and safety.           Assistance Recommended at Discharge Frequent or constant Supervision/Assistance  If plan is discharge home, recommend the following:  Can travel by private vehicle  A little help with walking and/or transfers;Assistance with cooking/housework;Assist for transportation;Help with stairs or ramp for entrance        Equipment Recommendations None recommended by PT  Recommendations for Other Services       Functional Status Assessment Patient has had a recent decline in their functional status and demonstrates the ability to make significant improvements in function in a reasonable and predictable amount of time.     Precautions / Restrictions Precautions Precautions: Fall Restrictions Weight Bearing Restrictions: No      Mobility  Bed Mobility               General bed mobility comments: up in chair on arrival    Transfers                    General transfer comment: BP too low for transfer back to bed (83/47 and after LE exercises in reclined position 70/55).    Ambulation/Gait                  Stairs            Wheelchair Mobility     Tilt Bed    Modified Rankin (Stroke Patients Only)       Balance                                             Pertinent Vitals/Pain Pain Assessment Pain Assessment: Faces Faces Pain Scale: Hurts a little bit Pain Location: stomach Pain Descriptors / Indicators: Sore Pain Intervention(s): Limited activity within patient's tolerance    Home Living Family/patient expects to be discharged to:: Private residence Living Arrangements: Children Available Help at Discharge: Family;Available 24 hours/day Type of Home: House Home Access: Stairs to enter Entrance Stairs-Rails: Right Entrance Stairs-Number of Steps: 4   Home Layout: One level Home Equipment: Tub bench;Cane - single Librarian, academic (2 wheels) Additional Comments: lives with daughter who had a recent foot surgery nwb 8 weeks. family visiting from [place] with daughter able to extend stay past august 2024 as needed. family is renting a house at present with patient living there. pt normally lives at apartment with elevator    Prior Function Prior Level  of Function : Needs assist             Mobility Comments: walks with rollator modified independent; minguard on stairs ADLs Comments: daughter reports indep with all task     Hand Dominance   Dominant Hand: Right    Extremity/Trunk Assessment   Upper Extremity Assessment RUE Deficits / Details: hx of shoulder injury    Lower Extremity Assessment Lower Extremity Assessment: Generalized weakness    Cervical / Trunk Assessment Cervical / Trunk Assessment: Kyphotic  Communication   Communication: HOH  Cognition Arousal/Alertness: Awake/alert Behavior During Therapy: WFL for tasks assessed/performed Overall  Cognitive Status: Impaired/Different from baseline                                 General Comments: requires incr instructional cues with tactile cues for LE exercises        General Comments General comments (skin integrity, edema, etc.): Pt up in chair on arrival with low bP 83/47; reclined in recliner and performed LE exercises to try to incr bP with even lower at 70/55. RN in to assess pt. Legs elevated on pillows and further reclined. RN getting an order for fluids and did not want to try to get pt back to bed until he gets fluids.    Exercises General Exercises - Lower Extremity Ankle Circles/Pumps: AAROM, Both, 10 reps, Supine Quad Sets: AROM, Both, 10 reps, Supine   Assessment/Plan    PT Assessment Patient needs continued PT services  PT Problem List Decreased strength;Decreased activity tolerance;Decreased balance;Decreased mobility;Decreased cognition;Decreased knowledge of use of DME;Decreased safety awareness;Cardiopulmonary status limiting activity;Pain       PT Treatment Interventions DME instruction;Gait training;Stair training;Functional mobility training;Therapeutic activities;Therapeutic exercise;Cognitive remediation;Patient/family education    PT Goals (Current goals can be found in the Care Plan section)  Acute Rehab PT Goals Patient Stated Goal: return home PT Goal Formulation: With patient/family Time For Goal Achievement: 05/27/23 Potential to Achieve Goals: Good    Frequency Min 3X/week     Co-evaluation               AM-PAC PT "6 Clicks" Mobility  Outcome Measure Help needed turning from your back to your side while in a flat bed without using bedrails?: None Help needed moving from lying on your back to sitting on the side of a flat bed without using bedrails?: A Little Help needed moving to and from a bed to a chair (including a wheelchair)?: A Little Help needed standing up from a chair using your arms (e.g., wheelchair or  bedside chair)?: A Little Help needed to walk in hospital room?: Total Help needed climbing 3-5 steps with a railing? : Total 6 Click Score: 15    End of Session   Activity Tolerance: Treatment limited secondary to medical complications (Comment) (hypotension) Patient left: in chair;with call bell/phone within reach;with family/visitor present;with nursing/sitter in room Nurse Communication: Other (comment) (low BP; RN contacting MD) PT Visit Diagnosis: Difficulty in walking, not elsewhere classified (R26.2)    Time: 4401-0272 PT Time Calculation (min) (ACUTE ONLY): 15 min   Charges:   PT Evaluation $PT Eval Low Complexity: 1 Low   PT General Charges $$ ACUTE PT VISIT: 1 Visit          Jerolyn Center, PT Acute Rehabilitation Services  Office [phone]   Zena Amos 05/13/2023, 10:03 AM

## 2023-05-13 NOTE — Plan of Care (Signed)
  Problem: Education: Goal: Ability to describe self-care measures that may prevent or decrease complications (Diabetes Survival Skills Education) will improve Outcome: Progressing   Problem: Nutritional: Goal: Maintenance of adequate nutrition will improve Outcome: Progressing   Problem: Skin Integrity: Goal: Risk for impaired skin integrity will decrease Outcome: Progressing   Problem: Activity: Goal: Risk for activity intolerance will decrease Outcome: Progressing

## 2023-05-13 NOTE — Care Management Important Message (Signed)
Important Message  Patient Details  Name: LUKA STOHR MRN: [id number] Date of Birth: [birth year]   Medicare Important Message Given:        Dorena Bodo 05/13/2023, 3:41 PM

## 2023-05-13 NOTE — Progress Notes (Signed)
PROGRESS NOTE                                                                                                                                                                                                             Patient Demographics:    Rowyn Spilde, is a 89 y.o. male, DOB - 04/23/1926, QMV:[id number]  Outpatient Primary MD for the patient is Tisovec, Adelfa Koh, MD    LOS - 3  Admit date - 05/10/2023    Chief Complaint  Patient presents with   Hernia   Chest Pain       Brief Narrative (HPI from H&P)   89 y.o. male with medical history significant of chronic right inguinal hernia, PAF on Eliquis, CAD status post CABG x 4, chronic HFrEF LVEF 30-50%, HTN, IIDM, presented with recurrent nonreducible right hernia and abdominal pain.    Subjective:   Patient in bed, appears comfortable, denies any headache, no fever, no chest pain or pressure, no shortness of breath , no abdominal pain. No new focal weakness.   Assessment  & Plan :   SBO, obstructed right inguinal hernia -  seen by [facility], he has bilateral hernias with right more symptomatic than left, underwent bilateral inguinal hernia repair by Dr. Chevis Pretty on 05/12/2023, placed on regular diet by general surgery I have transitioned him to soft consistency for ease of he swallowing, advance activity, encouraged to sit in recliner in daytime,, PT OT, I-S.  Seems to have tolerated the procedure well.  Monitor.    Paroxysmal A-fib with RVR - seen by cardiology, low-dose oral beta-blocker, heparin drip, [place] vas 2 score of greater than 6, continue beta-blocker at home dose, resume Eliquis, appreciate cardiology input, stable TSH echo noted.  Chronic systolic heart failure EF now improved to 45% from 40% 2 years ago- he is currently compensated seen by cardiology, on low-dose beta-blocker and Imdur, blood pressure low postop on 05/13/2023 hence medications adjusted, ARB held, he  also has at least moderate to severe AAS as well, hence will avoid rapid drop in preload, clinically compensated.    Patient on chronic steroids.  Placed on home dose steroids from 05/14/2023, stop IV steroids on 05/13/2023.  CAD CABG  - No chest pains, troponin negative x 2, No acute concerns continue statin and beta-blocker as tolerated.   IIDM  -  SSI    CBG (last 3)  Recent Labs    05/12/23 2340 05/13/23 0506 05/13/23 0735  GLUCAP 174* 118* 99   Lab Results  Component Value Date   TSH 0.922 05/11/2023         Condition - Extremely Guarded  Family Communication  : Daughter bedside 05/11/2023, 05/12/23, 05/13/2023  Code Status : Full code  Consults  : Cardiology, CCS  PUD Prophylaxis : PPI   Procedures  :     Bilateral inguinal hernia repair on 05/12/2023 by Dr. Chevis Pretty.    TTE - 1. Technically difficult study.  2. Left ventricular ejection fraction, by estimation, is 40 to 45%. The left ventricle has mildly decreased function. The left ventricle demonstrates global hypokinesis. There is mild concentric left ventricular hypertrophy. Left ventricular diastolic function could not be evaluated. Significant dyssynchrony due to LBBB.  3. Right ventricular systolic function is normal. The right ventricular size is normal.  4. There is severe calcifcation of the aortic valve. There is severe thickening of the aortic valve. Aortic valve regurgitation is not visualized. The aortic valve appears severly stenotic by visualization. There was limited/incomplete doppler evaluation for aortic stenosis with a mean gradient of .  5. The mitral valve is degenerative. Mild mitral valve regurgitation. Moderate to severe mitral annular calcification.  CT - 1. Small-bowel obstruction secondary to right inguinal hernia. 2. Small volume ascites. 3. Small pleural effusions. 4.  Aortic Atherosclerosis      Disposition Plan  :    Status is: Inpatient  DVT Prophylaxis  :        Lab Results   Component Value Date   PLT 209 05/13/2023    Diet :  Diet Order             DIET SOFT Fluid consistency: Thin  Diet effective now                    Inpatient Medications  Scheduled Meds:  dexamethasone (DECADRON) injection  4 mg Intravenous Q24H   finasteride  5 mg Oral QHS   insulin aspart  0-9 Units Subcutaneous Q6H   isosorbide mononitrate  15 mg Oral Daily   ketorolac  1 drop Both Eyes QID   metoprolol tartrate  25 mg Oral BID   mirabegron ER  50 mg Oral QHS   pantoprazole (PROTONIX) IV  40 mg Intravenous Q24H   pravastatin  80 mg Oral QHS   prednisoLONE acetate  1 drop Left Eye QID   Continuous Infusions:  acetaminophen 500 mg (05/13/23 0000)   PRN Meds:.acetaminophen, albuterol, metoprolol tartrate, nitroGLYCERIN, [DISCONTINUED] ondansetron **OR** ondansetron (ZOFRAN) IV, oxyCODONE  Antibiotics  :    Anti-infectives (From admission, onward)    Start     Dose/Rate Route Frequency Ordered Stop   05/10/23 2200  amoxicillin (AMOXIL) capsule 500 mg        500 mg Oral Every 12 hours 05/10/23 1925 05/12/23 0913         Objective:   Vitals:   05/12/23 2016 05/13/23 0000 05/13/23 0343 05/13/23 0626  BP:  109/70 108/65 99/65  Pulse: 81 82 79 84  Resp: (!) 22 18 18 20   Temp: 97.8 F (36.6 C) 97.7 F (36.5 C) 97.7 F (36.5 C) 98 F (36.7 C)  TempSrc: Oral Oral Oral Oral  SpO2: 98% 96% 96% 94%  Weight:        Wt Readings from Last 3 Encounters:  05/11/23 49.1 kg  03/05/23 49.1  kg  11/25/22 48.4 kg     Intake/Output Summary (Last 24 hours) at 05/13/2023 0918 Last data filed at 05/13/2023 0100 Gross per 24 hour  Intake 910 ml  Output 440 ml  Net 470 ml     Physical Exam  Awake Alert, No new F.N deficits, Normal affect Bray.AT,PERRAL Supple Neck, No JVD,   Symmetrical Chest wall movement, Good air movement bilaterally, few rales RRR,No Gallops,Rubs or new Murmurs,  +ve B.Sounds, Abd Soft, mild RLQ tenderness,   No Cyanosis, Clubbing or  edema        Data Review:    Recent Labs  Lab 05/10/23 0918 05/11/23 0156 05/12/23 0549 05/13/23 0331  WBC 9.2 6.5 5.4 8.7  HGB 9.7* 8.6* 8.8* 8.7*  HCT 31.7* 27.9* 27.9* 27.3*  PLT 220 196 190 209  MCV 63.5* 62.1* 63.0* 61.6*  MCH 19.4* 19.2* 19.9* 19.6*  MCHC 30.6 30.8 31.5 31.9  RDW 16.2* 15.9* 15.8* 15.7*  LYMPHSABS 0.8  --  1.6 1.4  MONOABS 0.3  --  0.6 0.8  EOSABS 0.0  --  0.1 0.0  BASOSABS 0.0  --  0.0 0.0    Recent Labs  Lab 05/10/23 0918 05/10/23 1100 05/10/23 1239 05/10/23 1634 05/11/23 0156 05/11/23 0655 05/12/23 0549 05/13/23 0331  NA 138  --   --   --  137  --  138 134*  K 4.6  --   --   --  3.8  --  3.6 4.3  CL 98  --   --   --  105  --  104 105  CO2 21*  --   --   --  24  --  26 22  ANIONGAP 19*  --   --   --  8  --  8 7  GLUCOSE 194*  --   --   --  104*  --  126* 137*  BUN 23  --   --   --  16  --  12 11  CREATININE 0.81  --   --   --  0.63  --  0.71 0.76  AST 30  --   --   --   --   --   --   --   ALT 14  --   --   --   --   --   --   --   ALKPHOS 38  --   --   --   --   --   --   --   BILITOT 1.2  --   --   --   --   --   --   --   ALBUMIN 3.9  --   --   --   --   --   --   --   CRP  --   --   --   --   --  0.6 <0.5 <0.5  PROCALCITON  --   --   --   --   --  <0.10 <0.10 <0.10  LATICACIDVEN  --   --  1.1 1.0  --   --   --   --   TSH  --   --   --   --   --  0.922  --   --   HGBA1C  --   --   --  6.0*  --   --   --   --   BNP  --  1,170.3*  --   --   --  824.9* 826.8* 1,183.7*  MG  --   --   --  1.9  --   --  1.9 1.7  CALCIUM 9.1  --   --   --  8.2*  --  8.7* 8.1*      Recent Labs  Lab 05/10/23 0918 05/10/23 1100 05/10/23 1239 05/10/23 1634 05/11/23 0156 05/11/23 0655 05/12/23 0549 05/13/23 0331  CRP  --   --   --   --   --  0.6 <0.5 <0.5  PROCALCITON  --   --   --   --   --  <0.10 <0.10 <0.10  LATICACIDVEN  --   --  1.1 1.0  --   --   --   --   TSH  --   --   --   --   --  0.922  --   --   HGBA1C  --   --   --  6.0*  --   --    --   --   BNP  --  1,170.3*  --   --   --  824.9* 826.8* 1,183.7*  MG  --   --   --  1.9  --   --  1.9 1.7  CALCIUM 9.1  --   --   --  8.2*  --  8.7* 8.1*      Lab Results  Component Value Date   HGBA1C 6.0 (H) 05/10/2023    Radiology Reports DG Chest Port 1 View  Result Date: 05/13/2023 CLINICAL DATA:  Shortness of breath. EXAM: PORTABLE CHEST 1 VIEW COMPARISON:  05/11/2023 FINDINGS: The cardio pericardial silhouette is enlarged. Interval slight increase in left base atelectasis with probable small left pleural effusion. Interstitial markings are diffusely coarsened with chronic features. Bones are diffusely demineralized. Telemetry leads overlie the chest. IMPRESSION: Interval slight increase in left base atelectasis with probable small left pleural effusion. Electronically Signed   By: Kennith Center M.D.   On: 05/13/2023 06:53   ECHOCARDIOGRAM COMPLETE  Result Date: 05/11/2023    ECHOCARDIOGRAM REPORT   Patient Name:   DONZELL COLLER Date of Exam: 05/11/2023 Medical Rec #:  [id number]    Height:       62.0 in Accession #:    [id number]   Weight:       108.2 lb Date of Birth:  1926-05-28     BSA:          1.473 m Patient Age:    89 years     BP:           117/78 mmHg Patient Gender: M            HR:           80 bpm. Exam Location:  Inpatient Procedure: 2D Echo, Color Doppler and Cardiac Doppler Indications:    CHF  History:        Patient has prior history of Echocardiogram examinations, most                 recent 04/14/2021. CAD, SBO and COPD; Risk Factors:Dyslipidemia                 and Diabetes.  Sonographer:    Milbert Coulter Referring Phys: [id number] Emeline General IMPRESSIONS  1. Technically difficult study.  2. Left ventricular ejection fraction, by estimation, is 40 to 45%. The left ventricle has mildly decreased function. The left ventricle demonstrates global hypokinesis. There is mild concentric left  ventricular hypertrophy. Left ventricular diastolic function could not be evaluated.  Significant dyssynchrony due to LBBB.  3. Right ventricular systolic function is normal. The right ventricular size is normal.  4. There is severe calcifcation of the aortic valve. There is severe thickening of the aortic valve. Aortic valve regurgitation is not visualized. The aortic valve appears severly stenotic by visualization. There was limited/incomplete doppler evaluation for aortic stenosis with a mean gradient of .  5. The mitral valve is degenerative. Mild mitral valve regurgitation. Moderate to severe mitral annular calcification. FINDINGS  Left Ventricle: Left ventricular ejection fraction, by estimation, is 40 to 45%. The left ventricle has mildly decreased function. The left ventricle demonstrates global hypokinesis. The left ventricular internal cavity size was normal in size. There is  mild concentric left ventricular hypertrophy. Abnormal (paradoxical) septal motion, consistent with left bundle branch block. Left ventricular diastolic function could not be evaluated due to nondiagnostic images. Left ventricular diastolic function could not be evaluated. Right Ventricle: The right ventricular size is normal. No increase in right ventricular wall thickness. Right ventricular systolic function is normal. Left Atrium: Left atrial size was normal in size. Right Atrium: Right atrial size was normal in size. Pericardium: There is no evidence of pericardial effusion. Mitral Valve: The mitral valve is degenerative in appearance. There is mild thickening of the mitral valve leaflet(s). There is mild calcification of the mitral valve leaflet(s). Moderate to severe mitral annular calcification. Mild mitral valve regurgitation. Tricuspid Valve: The tricuspid valve is grossly normal. Tricuspid valve regurgitation is mild. Aortic Valve: The aortic valve is calcified. There is severe calcifcation of the aortic valve. There is severe thickening of the aortic valve. There is moderate aortic valve annular  calcification. Aortic valve regurgitation is not visualized. Aortic valve mean gradient measures 5.0 mmHg. Aortic valve peak gradient measures 9.2 mmHg. Aortic valve area, by VTI measures 1.98 cm. Pulmonic Valve: The pulmonic valve was not well visualized. Pulmonic valve regurgitation is trivial. Aorta: The aortic root and ascending aorta are structurally normal, with no evidence of dilitation. IAS/Shunts: No atrial level shunt detected by color flow Doppler.  LEFT VENTRICLE PLAX 2D LVIDd:         4.00 cm   Diastology LVIDs:         3.00 cm   LV e' medial: 7.29 cm/s LV PW:         1.00 cm LV IVS:        1.00 cm LVOT diam:     2.10 cm LV SV:         41 LV SV Index:   28 LVOT Area:     3.46 cm  RIGHT VENTRICLE RV S prime:     6.53 cm/s TAPSE (M-mode): 1.3 cm LEFT ATRIUM             Index        RIGHT ATRIUM           Index LA Vol (A2C):   61.6 ml 41.83 ml/m  RA Area:     25.70 cm LA Vol (A4C):   65.5 ml 44.48 ml/m  RA Volume:   76.70 ml  52.08 ml/m LA Biplane Vol: 67.5 ml 45.84 ml/m  AORTIC VALVE AV Area (Vmax):    1.66 cm AV Area (Vmean):   1.67 cm AV Area (VTI):     1.98 cm AV Vmax:           152.00 cm/s AV Vmean:  97.700 cm/s AV VTI:            0.206 m AV Peak Grad:      9.2 mmHg AV Mean Grad:      5.0 mmHg LVOT Vmax:         72.80 cm/s LVOT Vmean:        47.100 cm/s LVOT VTI:          0.118 m LVOT/AV VTI ratio: 0.57  AORTA Ao Root diam: 3.60 cm Ao Asc diam:  3.00 cm TRICUSPID VALVE TR Peak grad:   35.3 mmHg TR Vmax:        297.00 cm/s  SHUNTS Systemic VTI:  0.12 m Systemic Diam: 2.10 cm Aditya Sabharwal Electronically signed by [name] Signature Date/Time: 05/11/2023/6:14:34 PM    Final    DG Chest 1 View  Result Date: 05/11/2023 CLINICAL DATA:  Congestive heart failure. EXAM: CHEST  1 VIEW COMPARISON:  May 10, 2023. FINDINGS: Stable cardiomediastinal silhouette. Status post coronary bypass graft. Degenerative changes are seen involving both glenohumeral joints. Minimal left midlung  and bibasilar opacities are noted concerning for subsegmental atelectasis or scarring. Small left pleural effusion is noted. IMPRESSION: Minimal left mid lung and bibasilar opacities are noted concerning for subsegmental atelectasis or possibly scarring. Small left pleural effusion is noted. Electronically Signed   By: Lupita Raider M.D.   On: 05/11/2023 08:12   CT ABDOMEN PELVIS W CONTRAST  Result Date: 05/10/2023 CLINICAL DATA:  Right inguinal hernia, epigastric pain EXAM: CT ABDOMEN AND PELVIS WITH CONTRAST TECHNIQUE: Multidetector CT imaging of the abdomen and pelvis was performed using the standard protocol following bolus administration of intravenous contrast. RADIATION DOSE REDUCTION: This exam was performed according to the departmental dose-optimization program which includes automated exposure control, adjustment of the mA and/or kV according to patient size and/or use of iterative reconstruction technique. CONTRAST:  75mL OMNIPAQUE IOHEXOL 350 MG/ML SOLN COMPARISON:  CT chest 07/22/2015 FINDINGS: Lower chest: Small pleural effusions, new since previous, with atelectasis posteriorly at the lung bases. Post median sternotomy. Coronary and aortic atheromatous calcifications. Mitral annulus calcifications. Hepatobiliary: No focal liver abnormality is seen. No gallstones, gallbladder wall thickening, or biliary dilatation. Pancreas: Unremarkable. No pancreatic ductal dilatation or surrounding inflammatory changes. Spleen: Normal in size without focal abnormality. Adrenals/Urinary Tract: She no adrenal mass. Symmetric renal parenchymal enhancement. Multiple cortical lesions in both kidneys, some of which can be characterized as cysts, largest 2.1 cm 5 HU right upper pole; no followup recommended. No evident urolithiasis or hydronephrosis. Urinary bladder physiologically distended. Stomach/Bowel: Stomach is decompressed. Duodenum is nondistended, unremarkable. Proximal small bowel is decompressed. There  are multiple dilated mid small bowel loops. A loop of small bowel is involved in right inguinal hernia, and this appears to be a transition point to decompressed distal small bowel. The colon is incompletely distended, with a few scattered diverticula distally. Vascular/Lymphatic: Aortoiliac calcified atheromatous plaque. Portal vein patent. No abdominal or pelvic adenopathy. Reproductive: Prostate enlargement with central coarse calcifications. Other: Small volume scattered abdominal and pelvic ascites. Fluid extends into bilateral inguinal hernias. Musculoskeletal: Lumbar levoscoliosis with multilevel spondylitic change. Heterogenous mineralization of the bones. No fracture or or discrete lesion. Sternotomy wires. IMPRESSION: 1. Small-bowel obstruction secondary to right inguinal hernia. 2. Small volume ascites. 3. Small pleural effusions. 4.  Aortic Atherosclerosis (ICD10-I70.0). Electronically Signed   By: Corlis Leak M.D.   On: 05/10/2023 11:29   DG Chest Portable 1 View  Result Date: 05/10/2023 CLINICAL DATA:  Cough and chest  pain. EXAM: PORTABLE CHEST 1 VIEW COMPARISON:  April 09, 2018 FINDINGS: Enlarged cardiac silhouette. Stable postsurgical changes from CABG. Chronic elevation of the left hemidiaphragm. Hazy airspace opacities throughout both lungs may represent hypoventilatory changes,, mixed pattern pulmonary edema or atypical/viral pneumonia. IMPRESSION: 1. Hazy airspace opacities throughout both lungs may represent hypoventilatory changes, mixed pattern pulmonary edema or atypical/viral pneumonia. 2. Enlarged cardiac silhouette. Electronically Signed   By: Ted Mcalpine M.D.   On: 05/10/2023 10:57      Signature  -   Susa Raring M.D on 05/13/2023 at 9:18 AM   -  To page go to [email or web address]

## 2023-05-13 NOTE — Progress Notes (Signed)
1 Day Post-Op  Subjective: CC: His daughter is at bedside.  Expected soreness that is well controlled.  Tolerating soft diet without n/v. Passing flatus. BM last night.  Up to the chair this am. Had episode of hypotension - [facility] aware and adjusting BP meds and IVF   Objective: Vital signs in last 24 hours: Temp:  [97.4 F (36.3 C)-98 F (36.7 C)] 97.7 F (36.5 C) (07/18 0930) Pulse Rate:  [79-96] 84 (07/18 0626) Resp:  [18-22] 20 (07/18 0626) BP: (83-146)/(45-99) 83/45 (07/18 0930) SpO2:  [92 %-100 %] 94 % (07/18 0626) Last BM Date : 05/12/23  Intake/Output from previous day: 07/17 0701 - 07/18 0700 In: 910 [I.V.:900; IV Piggyback:10] Out: 440 [Urine:425; Blood:15] Intake/Output this shift: No intake/output data recorded.  PE: Gen:  Alert, NAD, pleasant Abd: Soft, ND, NT, +BS, b/l inguinal incisions cdi  Lab Results:  Recent Labs    05/12/23 0549 05/13/23 0331  WBC 5.4 8.7  HGB 8.8* 8.7*  HCT 27.9* 27.3*  PLT 190 209   BMET Recent Labs    05/12/23 0549 05/13/23 0331  NA 138 134*  K 3.6 4.3  CL 104 105  CO2 26 22  GLUCOSE 126* 137*  BUN 12 11  CREATININE 0.71 0.76  CALCIUM 8.7* 8.1*   PT/INR No results for input(s): "LABPROT", "INR" in the last 72 hours. CMP     Component Value Date/Time   NA 134 (L) 05/13/2023 0331   NA 137 03/05/2023 1412   K 4.3 05/13/2023 0331   CL 105 05/13/2023 0331   CO2 22 05/13/2023 0331   GLUCOSE 137 (H) 05/13/2023 0331   BUN 11 05/13/2023 0331   BUN 28 03/05/2023 1412   CREATININE 0.76 05/13/2023 0331   CREATININE 0.73 07/20/2017 0949   CALCIUM 8.1 (L) 05/13/2023 0331   PROT 6.7 05/10/2023 0918   PROT 6.8 02/13/2022 1236   ALBUMIN 3.9 05/10/2023 0918   ALBUMIN 4.2 02/13/2022 1236   AST 30 05/10/2023 0918   ALT 14 05/10/2023 0918   ALKPHOS 38 05/10/2023 0918   BILITOT 1.2 05/10/2023 0918   BILITOT 0.9 02/13/2022 1236   GFRNONAA >60 05/13/2023 0331   GFRNONAA 82 07/20/2017 0949   GFRAA 87 11/29/2020 1601    GFRAA 95 07/20/2017 0949   Lipase     Component Value Date/Time   LIPASE 28 05/10/2023 0918    Studies/Results: DG Chest Port 1 View  Result Date: 05/13/2023 CLINICAL DATA:  Shortness of breath. EXAM: PORTABLE CHEST 1 VIEW COMPARISON:  05/11/2023 FINDINGS: The cardio pericardial silhouette is enlarged. Interval slight increase in left base atelectasis with probable small left pleural effusion. Interstitial markings are diffusely coarsened with chronic features. Bones are diffusely demineralized. Telemetry leads overlie the chest. IMPRESSION: Interval slight increase in left base atelectasis with probable small left pleural effusion. Electronically Signed   By: Kennith Center M.D.   On: 05/13/2023 06:53   ECHOCARDIOGRAM COMPLETE  Result Date: 05/11/2023    ECHOCARDIOGRAM REPORT   Patient Name:   Dakota [name] Date of Exam: 05/11/2023 Medical Rec #:  [id number]    Height:       62.0 in Accession #:    [id number]   Weight:       108.2 lb Date of Birth:  [birth year]     BSA:          1.473 m Patient Age:    89 years     BP:  117/78 mmHg Patient Gender: M            HR:           80 bpm. Exam Location:  Inpatient Procedure: 2D Echo, Color Doppler and Cardiac Doppler Indications:    CHF  History:        Patient has prior history of Echocardiogram examinations, most                 recent 04/14/2021. CAD, SBO and COPD; Risk Factors:Dyslipidemia                 and Diabetes.  Sonographer:    Milbert Coulter Referring Phys: [id number] Emeline General IMPRESSIONS  1. Technically difficult study.  2. Left ventricular ejection fraction, by estimation, is 40 to 45%. The left ventricle has mildly decreased function. The left ventricle demonstrates global hypokinesis. There is mild concentric left ventricular hypertrophy. Left ventricular diastolic function could not be evaluated. Significant dyssynchrony due to LBBB.  3. Right ventricular systolic function is normal. The right ventricular size is normal.  4. There is  severe calcifcation of the aortic valve. There is severe thickening of the aortic valve. Aortic valve regurgitation is not visualized. The aortic valve appears severly stenotic by visualization. There was limited/incomplete doppler evaluation for aortic stenosis with a mean gradient of .  5. The mitral valve is degenerative. Mild mitral valve regurgitation. Moderate to severe mitral annular calcification. FINDINGS  Left Ventricle: Left ventricular ejection fraction, by estimation, is 40 to 45%. The left ventricle has mildly decreased function. The left ventricle demonstrates global hypokinesis. The left ventricular internal cavity size was normal in size. There is  mild concentric left ventricular hypertrophy. Abnormal (paradoxical) septal motion, consistent with left bundle branch block. Left ventricular diastolic function could not be evaluated due to nondiagnostic images. Left ventricular diastolic function could not be evaluated. Right Ventricle: The right ventricular size is normal. No increase in right ventricular wall thickness. Right ventricular systolic function is normal. Left Atrium: Left atrial size was normal in size. Right Atrium: Right atrial size was normal in size. Pericardium: There is no evidence of pericardial effusion. Mitral Valve: The mitral valve is degenerative in appearance. There is mild thickening of the mitral valve leaflet(s). There is mild calcification of the mitral valve leaflet(s). Moderate to severe mitral annular calcification. Mild mitral valve regurgitation. Tricuspid Valve: The tricuspid valve is grossly normal. Tricuspid valve regurgitation is mild. Aortic Valve: The aortic valve is calcified. There is severe calcifcation of the aortic valve. There is severe thickening of the aortic valve. There is moderate aortic valve annular calcification. Aortic valve regurgitation is not visualized. Aortic valve mean gradient measures 5.0 mmHg. Aortic valve peak gradient measures 9.2  mmHg. Aortic valve area, by VTI measures 1.98 cm. Pulmonic Valve: The pulmonic valve was not well visualized. Pulmonic valve regurgitation is trivial. Aorta: The aortic root and ascending aorta are structurally normal, with no evidence of dilitation. IAS/Shunts: No atrial level shunt detected by color flow Doppler.  LEFT VENTRICLE PLAX 2D LVIDd:         4.00 cm   Diastology LVIDs:         3.00 cm   LV e' medial: 7.29 cm/s LV PW:         1.00 cm LV IVS:        1.00 cm LVOT diam:     2.10 cm LV SV:         41 LV SV Index:  28 LVOT Area:     3.46 cm  RIGHT VENTRICLE RV S prime:     6.53 cm/s TAPSE (M-mode): 1.3 cm LEFT ATRIUM             Index        RIGHT ATRIUM           Index LA Vol (A2C):   61.6 ml 41.83 ml/m  RA Area:     25.70 cm LA Vol (A4C):   65.5 ml 44.48 ml/m  RA Volume:   76.70 ml  52.08 ml/m LA Biplane Vol: 67.5 ml 45.84 ml/m  AORTIC VALVE AV Area (Vmax):    1.66 cm AV Area (Vmean):   1.67 cm AV Area (VTI):     1.98 cm AV Vmax:           152.00 cm/s AV Vmean:          97.700 cm/s AV VTI:            0.206 m AV Peak Grad:      9.2 mmHg AV Mean Grad:      5.0 mmHg LVOT Vmax:         72.80 cm/s LVOT Vmean:        47.100 cm/s LVOT VTI:          0.118 m LVOT/AV VTI ratio: 0.57  AORTA Ao Root diam: 3.60 cm Ao Asc diam:  3.00 cm TRICUSPID VALVE TR Peak grad:   35.3 mmHg TR Vmax:        297.00 cm/s  SHUNTS Systemic VTI:  0.12 m Systemic Diam: 2.10 cm Aditya Sabharwal Electronically signed by [name] Signature Date/Time: 05/11/2023/6:14:34 PM    Final     Anti-infectives: Anti-infectives (From admission, onward)    Start     Dose/Rate Route Frequency Ordered Stop   05/10/23 2200  amoxicillin (AMOXIL) capsule 500 mg        500 mg Oral Every 12 hours 05/10/23 1925 05/12/23 0913        Assessment/Plan POD 1 s/p bilateral inguinal hernia repairs with mesh by Dr. Carolynne Edouard on 7/17 - Tolerating soft diet without n/v and having bowel function - Incisions cdi - Pulm toilet - Mobilize,  PT  FEN - Soft diet. IVF per [facility] VTE - SCDs, Eliquis ID - Peri-op abx. None currently.  Foley - None, voiding   LOS: 3 days    Jacinto Halim , [facility] 05/13/2023, 10:42 AM Please see Amion for pager number during day hours 7:00am-4:30pm

## 2023-05-14 DIAGNOSIS — K56609 Unspecified intestinal obstruction, unspecified as to partial versus complete obstruction: Secondary | ICD-10-CM | POA: Diagnosis not present

## 2023-05-14 LAB — BASIC METABOLIC PANEL
Anion gap: 10 (ref 5–15)
BUN: 13 mg/dL (ref 8–23)
CO2: 24 mmol/L (ref 22–32)
Calcium: 8.2 mg/dL — ABNORMAL LOW (ref 8.9–10.3)
Chloride: 100 mmol/L (ref 98–111)
Creatinine, Ser: 0.67 mg/dL (ref 0.61–1.24)
GFR, Estimated: >60 mL/min (ref >60)
Glucose, Bld: 121 mg/dL — ABNORMAL HIGH (ref 70–99)
Potassium: 3.8 mmol/L (ref 3.5–5.1)
Sodium: 134 mmol/L — ABNORMAL LOW (ref 135–145)

## 2023-05-14 LAB — CBC WITH DIFFERENTIAL/PLATELET
Abs Immature Granulocytes: 0.02 10*3/uL (ref 0.00–0.07)
Basophils Absolute: 0 10*3/uL (ref 0.0–0.1)
Basophils Relative: 1 %
Eosinophils Absolute: 0.1 10*3/uL (ref 0.0–0.5)
Eosinophils Relative: 1 %
HCT: 28.1 % — ABNORMAL LOW (ref 39.0–52.0)
Hemoglobin: 8.7 g/dL — ABNORMAL LOW (ref 13.0–17.0)
Immature Granulocytes: 0 %
Lymphocytes Relative: 25 %
Lymphs Abs: 1.9 10*3/uL (ref 0.7–4.0)
MCH: 19.6 pg — ABNORMAL LOW (ref 26.0–34.0)
MCHC: 31 g/dL (ref 30.0–36.0)
MCV: 63.3 fL — ABNORMAL LOW (ref 80.0–100.0)
Monocytes Absolute: 0.7 10*3/uL (ref 0.1–1.0)
Monocytes Relative: 9 %
Neutro Abs: 5 10*3/uL (ref 1.7–7.7)
Neutrophils Relative %: 64 %
Platelets: 198 10*3/uL (ref 150–400)
RBC: 4.44 MIL/uL (ref 4.22–5.81)
RDW: 15.7 % — ABNORMAL HIGH (ref 11.5–15.5)
Smear Review: NORMAL
WBC: 7.8 10*3/uL (ref 4.0–10.5)
nRBC: 0 % (ref 0.0–0.2)

## 2023-05-14 LAB — GLUCOSE, CAPILLARY
Glucose-Capillary: 115 mg/dL — ABNORMAL HIGH (ref 70–99)
Glucose-Capillary: 125 mg/dL — ABNORMAL HIGH (ref 70–99)
Glucose-Capillary: 126 mg/dL — ABNORMAL HIGH (ref 70–99)
Glucose-Capillary: 193 mg/dL — ABNORMAL HIGH (ref 70–99)

## 2023-05-14 LAB — C-REACTIVE PROTEIN: CRP: 0.8 mg/dL (ref <1.0)

## 2023-05-14 LAB — PROCALCITONIN: Procalcitonin: <0.1 ng/mL

## 2023-05-14 LAB — BRAIN NATRIURETIC PEPTIDE: B Natriuretic Peptide: 972.6 pg/mL — ABNORMAL HIGH (ref 0.0–100.0)

## 2023-05-14 LAB — MAGNESIUM: Magnesium: 1.7 mg/dL (ref 1.7–2.4)

## 2023-05-14 MED ORDER — PANTOPRAZOLE SODIUM 40 MG PO TBEC
40.0000 mg | DELAYED_RELEASE_TABLET | Freq: Every day | ORAL | Status: DC
  Administered 2023-05-15 – 2023-05-17 (×3): 40 mg via ORAL
  Filled 2023-05-14 (×3): qty 1

## 2023-05-14 MED ORDER — METOPROLOL TARTRATE 12.5 MG HALF TABLET
12.5000 mg | ORAL_TABLET | Freq: Two times a day (BID) | ORAL | Status: DC
  Administered 2023-05-14 – 2023-05-17 (×6): 12.5 mg via ORAL
  Filled 2023-05-14 (×6): qty 1

## 2023-05-14 NOTE — Care Management Important Message (Signed)
Important Message  Patient Details  Name: FELTON BUCZYNSKI MRN: [id number] Date of Birth: 10/09/1926   Medicare Important Message Given:  Yes     Sherilyn Banker 05/14/2023, 3:23 PM

## 2023-05-14 NOTE — Progress Notes (Signed)
PHARMACIST - PHYSICIAN COMMUNICATION  DR:   Thedore Mins  CONCERNING: IV to Oral Route Change Policy  RECOMMENDATION: This patient is receiving protonix by the intravenous route.  Based on criteria approved by the Pharmacy and Therapeutics Committee, the intravenous medication(s) is/are being converted to the equivalent oral dose form(s).   DESCRIPTION: These criteria include: The patient is eating (either orally or via tube) and/or has been taking other orally administered medications for a least 24 hours The patient has no evidence of active gastrointestinal bleeding or impaired GI absorption (gastrectomy, short bowel, patient on TNA or NPO).  If you have questions about this conversion, please contact the Pharmacy Department  []   [phone] )  [facility] []   [phone] )  [facility] [x]   [phone] )  [facility] []   [phone] )  [facility] []   [phone] )  [facility]

## 2023-05-14 NOTE — Progress Notes (Signed)
PROGRESS NOTE                                                                                                                                                                                                             Patient Demographics:    Dakota [name], is a 89 y.o. male, DOB - November 16, 1925, QIO:[id number]  Outpatient Primary MD for the patient is Tisovec, Adelfa Koh, MD    LOS - 4  Admit date - 05/10/2023    Chief Complaint  Patient presents with   Hernia   Chest Pain       Brief Narrative (HPI from H&P)   89 y.o. male with medical history significant of chronic right inguinal hernia, PAF on Eliquis, CAD status post CABG x 4, chronic HFrEF LVEF 30-50%, HTN, IIDM, presented with recurrent nonreducible right hernia and abdominal pain.    Subjective:   Patient in bed, appears comfortable, denies any headache, no fever, no chest pain or pressure, no shortness of breath , mild post op abdominal pain. No new focal weakness. No BM, passing some flatus.   Assessment  & Plan :   SBO, obstructed right inguinal hernia -  seen by [facility], he has bilateral hernias with right more symptomatic than left, underwent bilateral inguinal hernia repair by Dr. Chevis Pretty on 05/12/2023, placed on regular diet by general surgery I have transitioned him to soft consistency for ease of he swallowing, advance activity, encouraged to sit in recliner in daytime,IS, PT OT. Seems to have tolerated the procedure well but ++ frail at baseline, weak cough reflex.  Monitor.    Paroxysmal A-fib with RVR - seen by cardiology, low-dose oral beta-blocker, [place] vas 2 score of greater than 6, continue beta-blocker at home dose, resume Eliquis, appreciate cardiology input, stable TSH, echo noted.  Chronic systolic heart failure EF now improved to 45% from 40% 2 years ago- he is currently compensated seen by cardiology, on low-dose beta-blocker and Imdur, blood pressure low  postop on 05/13/2023 hence medications adjusted, ARB held, he also has at least moderate to severe AAS as well, hence will avoid rapid drop in preload, clinically compensated.    Patient on chronic steroids.  Placed on home dose steroids from 05/14/2023, stop IV steroids on 05/13/2023.  CAD CABG  - No chest pains, troponin negative x 2, No acute  concerns continue statin and beta-blocker as tolerated.   IIDM  -SSI    CBG (last 3)  Recent Labs    05/13/23 1628 05/14/23 0029 05/14/23 0547  GLUCAP 104* 125* 126*   Lab Results  Component Value Date   TSH 0.922 05/11/2023         Condition - Extremely Guarded  Family Communication  : Daughter bedside 05/11/2023, 05/12/23, 05/13/2023, 05/14/23  Code Status : Full code  Consults  : Cardiology, [facility]  PUD Prophylaxis : PPI   Procedures  :     Bilateral inguinal hernia repair on 05/12/2023 by Dr. Chevis Pretty.    TTE - 1. Technically difficult study.  2. Left ventricular ejection fraction, by estimation, is 40 to 45%. The left ventricle has mildly decreased function. The left ventricle demonstrates global hypokinesis. There is mild concentric left ventricular hypertrophy. Left ventricular diastolic function could not be evaluated. Significant dyssynchrony due to LBBB.  3. Right ventricular systolic function is normal. The right ventricular size is normal.  4. There is severe calcifcation of the aortic valve. There is severe thickening of the aortic valve. Aortic valve regurgitation is not visualized. The aortic valve appears severly stenotic by visualization. There was limited/incomplete doppler evaluation for aortic stenosis with a mean gradient of .  5. The mitral valve is degenerative. Mild mitral valve regurgitation. Moderate to severe mitral annular calcification.  CT - 1. Small-bowel obstruction secondary to right inguinal hernia. 2. Small volume ascites. 3. Small pleural effusions. 4.  Aortic Atherosclerosis      Disposition Plan  :     Status is: Inpatient  DVT Prophylaxis  :    apixaban (ELIQUIS) tablet 2.5 mg Start: 05/13/23 1115 apixaban (ELIQUIS) tablet 2.5 mg    Lab Results  Component Value Date   PLT 198 05/14/2023    Diet :  Diet Order             DIET SOFT Fluid consistency: Thin  Diet effective now                    Inpatient Medications  Scheduled Meds:  apixaban  2.5 mg Oral BID   finasteride  5 mg Oral QHS   insulin aspart  0-9 Units Subcutaneous Q6H   isosorbide mononitrate  15 mg Oral Daily   ketorolac  1 drop Both Eyes QID   metoprolol tartrate  25 mg Oral BID   mirabegron ER  50 mg Oral QHS   pantoprazole (PROTONIX) IV  40 mg Intravenous Q24H   pravastatin  80 mg Oral QHS   prednisoLONE acetate  1 drop Left Eye QID   Continuous Infusions:   PRN Meds:.acetaminophen, albuterol, metoprolol tartrate, nitroGLYCERIN, [DISCONTINUED] ondansetron **OR** ondansetron (ZOFRAN) IV, oxyCODONE  Antibiotics  :    Anti-infectives (From admission, onward)    Start     Dose/Rate Route Frequency Ordered Stop   05/10/23 2200  amoxicillin (AMOXIL) capsule 500 mg        500 mg Oral Every 12 hours 05/10/23 1925 05/12/23 0913         Objective:   Vitals:   05/13/23 1919 05/13/23 2300 05/14/23 0000 05/14/23 0400  BP:    112/64  Pulse:    100  Resp:  16 20 20   Temp: 98.4 F (36.9 C)  98.2 F (36.8 C) 98 F (36.7 C)  TempSrc: Oral  Oral Oral  SpO2:    94%  Weight:        Wt  Readings from Last 3 Encounters:  05/11/23 49.1 kg  03/05/23 49.1 kg  11/25/22 48.4 kg     Intake/Output Summary (Last 24 hours) at 05/14/2023 0840 Last data filed at 05/13/2023 1900 Gross per 24 hour  Intake 820 ml  Output --  Net 820 ml     Physical Exam  Awake , weak frail elderly male, weak cough, No new F.N deficits,   [place].AT,PERRAL Supple Neck, No JVD,   Symmetrical Chest wall movement, Mod air movement bilaterally,  RRR,No Gallops,Rubs or new Murmurs,  +ve B.Sounds, Abd Soft, mild RLQ  tenderness,   No Cyanosis, Clubbing or edema        Data Review:    Recent Labs  Lab 05/10/23 0918 05/11/23 0156 05/12/23 0549 05/13/23 0331 05/14/23 0735  WBC 9.2 6.5 5.4 8.7 7.8  HGB 9.7* 8.6* 8.8* 8.7* 8.7*  HCT 31.7* 27.9* 27.9* 27.3* 28.1*  PLT 220 196 190 209 198  MCV 63.5* 62.1* 63.0* 61.6* 63.3*  MCH 19.4* 19.2* 19.9* 19.6* 19.6*  MCHC 30.6 30.8 31.5 31.9 31.0  RDW 16.2* 15.9* 15.8* 15.7* 15.7*  LYMPHSABS 0.8  --  1.6 1.4 PENDING  MONOABS 0.3  --  0.6 0.8 PENDING  EOSABS 0.0  --  0.1 0.0 PENDING  BASOSABS 0.0  --  0.0 0.0 PENDING    Recent Labs  Lab 05/10/23 0918 05/10/23 1100 05/10/23 1239 05/10/23 1634 05/11/23 0156 05/11/23 0655 05/12/23 0549 05/13/23 0331  NA 138  --   --   --  137  --  138 134*  K 4.6  --   --   --  3.8  --  3.6 4.3  CL 98  --   --   --  105  --  104 105  CO2 21*  --   --   --  24  --  26 22  ANIONGAP 19*  --   --   --  8  --  8 7  GLUCOSE 194*  --   --   --  104*  --  126* 137*  BUN 23  --   --   --  16  --  12 11  CREATININE 0.81  --   --   --  0.63  --  0.71 0.76  AST 30  --   --   --   --   --   --   --   ALT 14  --   --   --   --   --   --   --   ALKPHOS 38  --   --   --   --   --   --   --   BILITOT 1.2  --   --   --   --   --   --   --   ALBUMIN 3.9  --   --   --   --   --   --   --   CRP  --   --   --   --   --  0.6 <0.5 <0.5  PROCALCITON  --   --   --   --   --  <0.10 <0.10 <0.10  LATICACIDVEN  --   --  1.1 1.0  --   --   --   --   TSH  --   --   --   --   --  0.922  --   --  HGBA1C  --   --   --  6.0*  --   --   --   --   BNP  --  1,170.3*  --   --   --  824.9* 826.8* 1,183.7*  MG  --   --   --  1.9  --   --  1.9 1.7  CALCIUM 9.1  --   --   --  8.2*  --  8.7* 8.1*      Recent Labs  Lab 05/10/23 0918 05/10/23 1100 05/10/23 1239 05/10/23 1634 05/11/23 0156 05/11/23 0655 05/12/23 0549 05/13/23 0331  CRP  --   --   --   --   --  0.6 <0.5 <0.5  PROCALCITON  --   --   --   --   --  <0.10 <0.10 <0.10   LATICACIDVEN  --   --  1.1 1.0  --   --   --   --   TSH  --   --   --   --   --  0.922  --   --   HGBA1C  --   --   --  6.0*  --   --   --   --   BNP  --  1,170.3*  --   --   --  824.9* 826.8* 1,183.7*  MG  --   --   --  1.9  --   --  1.9 1.7  CALCIUM 9.1  --   --   --  8.2*  --  8.7* 8.1*      Lab Results  Component Value Date   HGBA1C 6.0 (H) 05/10/2023    Radiology Reports DG Chest Port 1 View  Result Date: 05/13/2023 CLINICAL DATA:  Shortness of breath. EXAM: PORTABLE CHEST 1 VIEW COMPARISON:  05/11/2023 FINDINGS: The cardio pericardial silhouette is enlarged. Interval slight increase in left base atelectasis with probable small left pleural effusion. Interstitial markings are diffusely coarsened with chronic features. Bones are diffusely demineralized. Telemetry leads overlie the chest. IMPRESSION: Interval slight increase in left base atelectasis with probable small left pleural effusion. Electronically Signed   By: Kennith Center M.D.   On: 05/13/2023 06:53   ECHOCARDIOGRAM COMPLETE  Result Date: 05/11/2023    ECHOCARDIOGRAM REPORT   Patient Name:   DANIELE YANKOWSKI Date of Exam: 05/11/2023 Medical Rec #:  [id number]    Height:       62.0 in Accession #:    [id number]   Weight:       108.2 lb Date of Birth:  [birth year]     BSA:          1.473 m Patient Age:    89 years     BP:           117/78 mmHg Patient Gender: M            HR:           80 bpm. Exam Location:  Inpatient Procedure: 2D Echo, Color Doppler and Cardiac Doppler Indications:    CHF  History:        Patient has prior history of Echocardiogram examinations, most                 recent 04/14/2021. CAD, SBO and COPD; Risk Factors:Dyslipidemia                 and Diabetes.  Sonographer:    Milbert Coulter Referring Phys: [id number]  T ZHANG IMPRESSIONS  1. Technically difficult study.  2. Left ventricular ejection fraction, by estimation, is 40 to 45%. The left ventricle has mildly decreased function. The left ventricle demonstrates  global hypokinesis. There is mild concentric left ventricular hypertrophy. Left ventricular diastolic function could not be evaluated. Significant dyssynchrony due to LBBB.  3. Right ventricular systolic function is normal. The right ventricular size is normal.  4. There is severe calcifcation of the aortic valve. There is severe thickening of the aortic valve. Aortic valve regurgitation is not visualized. The aortic valve appears severly stenotic by visualization. There was limited/incomplete doppler evaluation for aortic stenosis with a mean gradient of .  5. The mitral valve is degenerative. Mild mitral valve regurgitation. Moderate to severe mitral annular calcification. FINDINGS  Left Ventricle: Left ventricular ejection fraction, by estimation, is 40 to 45%. The left ventricle has mildly decreased function. The left ventricle demonstrates global hypokinesis. The left ventricular internal cavity size was normal in size. There is  mild concentric left ventricular hypertrophy. Abnormal (paradoxical) septal motion, consistent with left bundle branch block. Left ventricular diastolic function could not be evaluated due to nondiagnostic images. Left ventricular diastolic function could not be evaluated. Right Ventricle: The right ventricular size is normal. No increase in right ventricular wall thickness. Right ventricular systolic function is normal. Left Atrium: Left atrial size was normal in size. Right Atrium: Right atrial size was normal in size. Pericardium: There is no evidence of pericardial effusion. Mitral Valve: The mitral valve is degenerative in appearance. There is mild thickening of the mitral valve leaflet(s). There is mild calcification of the mitral valve leaflet(s). Moderate to severe mitral annular calcification. Mild mitral valve regurgitation. Tricuspid Valve: The tricuspid valve is grossly normal. Tricuspid valve regurgitation is mild. Aortic Valve: The aortic valve is calcified. There is  severe calcifcation of the aortic valve. There is severe thickening of the aortic valve. There is moderate aortic valve annular calcification. Aortic valve regurgitation is not visualized. Aortic valve mean gradient measures 5.0 mmHg. Aortic valve peak gradient measures 9.2 mmHg. Aortic valve area, by VTI measures 1.98 cm. Pulmonic Valve: The pulmonic valve was not well visualized. Pulmonic valve regurgitation is trivial. Aorta: The aortic root and ascending aorta are structurally normal, with no evidence of dilitation. IAS/Shunts: No atrial level shunt detected by color flow Doppler.  LEFT VENTRICLE PLAX 2D LVIDd:         4.00 cm   Diastology LVIDs:         3.00 cm   LV e' medial: 7.29 cm/s LV PW:         1.00 cm LV IVS:        1.00 cm LVOT diam:     2.10 cm LV SV:         41 LV SV Index:   28 LVOT Area:     3.46 cm  RIGHT VENTRICLE RV S prime:     6.53 cm/s TAPSE (M-mode): 1.3 cm LEFT ATRIUM             Index        RIGHT ATRIUM           Index LA Vol (A2C):   61.6 ml 41.83 ml/m  RA Area:     25.70 cm LA Vol (A4C):   65.5 ml 44.48 ml/m  RA Volume:   76.70 ml  52.08 ml/m LA Biplane Vol: 67.5 ml 45.84 ml/m  AORTIC VALVE AV Area (Vmax):    1.66 cm  AV Area (Vmean):   1.67 cm AV Area (VTI):     1.98 cm AV Vmax:           152.00 cm/s AV Vmean:          97.700 cm/s AV VTI:            0.206 m AV Peak Grad:      9.2 mmHg AV Mean Grad:      5.0 mmHg LVOT Vmax:         72.80 cm/s LVOT Vmean:        47.100 cm/s LVOT VTI:          0.118 m LVOT/AV VTI ratio: 0.57  AORTA Ao Root diam: 3.60 cm Ao Asc diam:  3.00 cm TRICUSPID VALVE TR Peak grad:   35.3 mmHg TR Vmax:        297.00 cm/s  SHUNTS Systemic VTI:  0.12 m Systemic Diam: 2.10 cm Aditya Sabharwal Electronically signed by [name] Signature Date/Time: 05/11/2023/6:14:34 PM    Final    DG Chest 1 View  Result Date: 05/11/2023 CLINICAL DATA:  Congestive heart failure. EXAM: CHEST  1 VIEW COMPARISON:  May 10, 2023. FINDINGS: Stable cardiomediastinal  silhouette. Status post coronary bypass graft. Degenerative changes are seen involving both glenohumeral joints. Minimal left midlung and bibasilar opacities are noted concerning for subsegmental atelectasis or scarring. Small left pleural effusion is noted. IMPRESSION: Minimal left mid lung and bibasilar opacities are noted concerning for subsegmental atelectasis or possibly scarring. Small left pleural effusion is noted. Electronically Signed   By: Lupita Raider M.D.   On: 05/11/2023 08:12   CT ABDOMEN PELVIS W CONTRAST  Result Date: 05/10/2023 CLINICAL DATA:  Right inguinal hernia, epigastric pain EXAM: CT ABDOMEN AND PELVIS WITH CONTRAST TECHNIQUE: Multidetector CT imaging of the abdomen and pelvis was performed using the standard protocol following bolus administration of intravenous contrast. RADIATION DOSE REDUCTION: This exam was performed according to the departmental dose-optimization program which includes automated exposure control, adjustment of the mA and/or kV according to patient size and/or use of iterative reconstruction technique. CONTRAST:  75mL OMNIPAQUE IOHEXOL 350 MG/ML SOLN COMPARISON:  CT chest 07/22/2015 FINDINGS: Lower chest: Small pleural effusions, new since previous, with atelectasis posteriorly at the lung bases. Post median sternotomy. Coronary and aortic atheromatous calcifications. Mitral annulus calcifications. Hepatobiliary: No focal liver abnormality is seen. No gallstones, gallbladder wall thickening, or biliary dilatation. Pancreas: Unremarkable. No pancreatic ductal dilatation or surrounding inflammatory changes. Spleen: Normal in size without focal abnormality. Adrenals/Urinary Tract: She no adrenal mass. Symmetric renal parenchymal enhancement. Multiple cortical lesions in both kidneys, some of which can be characterized as cysts, largest 2.1 cm 5 HU right upper pole; no followup recommended. No evident urolithiasis or hydronephrosis. Urinary bladder physiologically  distended. Stomach/Bowel: Stomach is decompressed. Duodenum is nondistended, unremarkable. Proximal small bowel is decompressed. There are multiple dilated mid small bowel loops. A loop of small bowel is involved in right inguinal hernia, and this appears to be a transition point to decompressed distal small bowel. The colon is incompletely distended, with a few scattered diverticula distally. Vascular/Lymphatic: Aortoiliac calcified atheromatous plaque. Portal vein patent. No abdominal or pelvic adenopathy. Reproductive: Prostate enlargement with central coarse calcifications. Other: Small volume scattered abdominal and pelvic ascites. Fluid extends into bilateral inguinal hernias. Musculoskeletal: Lumbar levoscoliosis with multilevel spondylitic change. Heterogenous mineralization of the bones. No fracture or or discrete lesion. Sternotomy wires. IMPRESSION: 1. Small-bowel obstruction secondary to right inguinal hernia. 2. Small volume  ascites. 3. Small pleural effusions. 4.  Aortic Atherosclerosis (ICD10-I70.0). Electronically Signed   By: Corlis Leak M.D.   On: 05/10/2023 11:29   DG Chest Portable 1 View  Result Date: 05/10/2023 CLINICAL DATA:  Cough and chest pain. EXAM: PORTABLE CHEST 1 VIEW COMPARISON:  April 09, 2018 FINDINGS: Enlarged cardiac silhouette. Stable postsurgical changes from CABG. Chronic elevation of the left hemidiaphragm. Hazy airspace opacities throughout both lungs may represent hypoventilatory changes,, mixed pattern pulmonary edema or atypical/viral pneumonia. IMPRESSION: 1. Hazy airspace opacities throughout both lungs may represent hypoventilatory changes, mixed pattern pulmonary edema or atypical/viral pneumonia. 2. Enlarged cardiac silhouette. Electronically Signed   By: Ted Mcalpine M.D.   On: 05/10/2023 10:57      Signature  -   Susa Raring M.D on 05/14/2023 at 8:40 AM   -  To page go to [email or web address]

## 2023-05-14 NOTE — Discharge Instructions (Addendum)
Information on my medicine - ELIQUIS (apixaban)  This medication education was reviewed with me or my healthcare representative as part of my discharge preparation.    Why was Eliquis prescribed for you? Eliquis was prescribed for you to reduce the risk of a blood clot forming that can cause a stroke if you have a medical condition called atrial fibrillation (a type of irregular heartbeat).  What do You need to know about Eliquis ? Take your Eliquis TWICE DAILY - one tablet in the morning and one tablet in the evening with or without food. If you have difficulty swallowing the tablet whole please discuss with your pharmacist how to take the medication safely.  Take Eliquis exactly as prescribed by your doctor and DO NOT stop taking Eliquis without talking to the doctor who prescribed the medication.  Stopping may increase your risk of developing a stroke.  Refill your prescription before you run out.  After discharge, you should have regular check-up appointments with your healthcare provider that is prescribing your Eliquis.  In the future your dose may need to be changed if your kidney function or weight changes by a significant amount or as you get older.  What do you do if you miss a dose? If you miss a dose, take it as soon as you remember on the same day and resume taking twice daily.  Do not take more than one dose of ELIQUIS at the same time to make up a missed dose.  Important Safety Information A possible side effect of Eliquis is bleeding. You should call your healthcare provider right away if you experience any of the following: Bleeding from an injury or your nose that does not stop. Unusual colored urine (red or dark brown) or unusual colored stools (red or black). Unusual bruising for unknown reasons. A serious fall or if you hit your head (even if there is no bleeding).  Some medicines may interact with Eliquis and might increase your risk of bleeding or clotting  while on Eliquis. To help avoid this, consult your healthcare provider or pharmacist prior to using any new prescription or non-prescription medications, including herbals, vitamins, non-steroidal anti-inflammatory drugs (NSAIDs) and supplements.  This website has more information on Eliquis (apixaban): http://www.eliquis.com/eliquis/home  [facility] _______[facility]  UMBILICAL OR INGUINAL HERNIA REPAIR: POST OP INSTRUCTIONS  Always review your discharge instruction sheet given to you by the facility where your surgery was performed. IF YOU HAVE DISABILITY OR FAMILY LEAVE FORMS, YOU MUST BRING THEM TO THE OFFICE FOR PROCESSING.   DO NOT GIVE THEM TO YOUR DOCTOR.  1. A  prescription for pain medication may be given to you upon discharge.  Take your pain medication as prescribed, if needed.  If narcotic pain medicine is not needed, then you may take acetaminophen (Tylenol) 2. Take your usually prescribed medications unless otherwise directed. If you need a refill on your pain medication, please contact your pharmacy.  They will contact our office to request authorization. Prescriptions will not be filled after 5 pm or on week-ends. 3. You should follow a light diet the first 24 hours after arrival home, such as soup and crackers, etc.  Be sure to include lots of fluids daily.  Resume your normal diet the day after surgery. 4.Most patients will experience some swelling and bruising around the umbilicus or in the groin and scrotum.  Ice packs and reclining will help.  Swelling and bruising can take several days to resolve.  6. It is  common to experience some constipation if taking pain medication after surgery.  Increasing fluid intake and taking a stool softener (such as Colace) will usually help or prevent this problem from occurring.  A mild laxative (Milk of Magnesia or Miralax) should be taken according to package directions if there are no bowel movements after 48 hours. 7. Unless  discharge instructions indicate otherwise, you may remove your bandages 24-48 hours after surgery, and you may shower at that time.  You may have steri-strips (small skin tapes) in place directly over the incision.  These strips should be left on the skin for 7-10 days.  If your surgeon used skin glue on the incision, you may shower in 24 hours.  The glue will flake off over the next 2-3 weeks.  Any sutures or staples will be removed at the office during your follow-up visit. 8. ACTIVITIES:  You may resume regular (light) daily activities beginning the next day--such as daily self-care, walking, climbing stairs--gradually increasing activities as tolerated.  You may have sexual intercourse when it is comfortable.  Refrain from any heavy lifting or straining until approved by your doctor.  a.You may drive when you are no longer taking prescription pain medication, you can comfortably wear a seatbelt, and you can safely maneuver your car and apply brakes. b.RETURN TO WORK:   _____________________________________________  9.You should see your doctor in the office for a follow-up appointment approximately 2-3 weeks after your surgery.  Make sure that you call for this appointment within a day or two after you arrive home to insure a convenient appointment time. 10.OTHER INSTRUCTIONS: _________________________    _____________________________________  WHEN TO CALL YOUR DOCTOR: Fever over 101.0 Inability to urinate Nausea and/or vomiting Extreme swelling or bruising Continued bleeding from incision. Increased pain, redness, or drainage from the incision  The clinic staff is available to answer your questions during regular business hours.  Please don't hesitate to call and ask to speak to one of the nurses for clinical concerns.  If you have a medical emergency, go to the nearest emergency room or call 911.  A surgeon from [facility] is always on call at the hospital   [address], [place], [place]  [address] ?  [address] [address], [place], [place]   [address] [phone] ? [phone] ? FAX [phone] Web site: [email or web address]

## 2023-05-14 NOTE — Progress Notes (Signed)
Physical Therapy Treatment Patient Details Name: Dakota [name] MRN: [id number] DOB: 19-Jul-1926 Today's Date: 05/14/2023   History of Present Illness 89 y.o. male presented 05/10/23 with recurrent nonreducible right hernia and abdominal pain. Attempting medical management. Now s/p bil inguinal hernia repairs 7/17. PMH significant of chronic right inguinal hernia, PAF on Eliquis, CAD status post CABG x 4, chronic HFrEF LVEF 30-50%, HTN, IIDM,    PT Comments  Pt greeted up in chair on arrival and agreeable to session with continued progress towards acute goals. Pt requiring grossly min guard for transfers and gait with rollator for support with pt demonstrating good ability to negotiate obstacles and tight space of room. Pt declining further hallway ambulation due to fatigue. Pt able to step up/down on single step x2 trials with bil UE support on footboard to simulate sidestepping up stairs at home with min A to steady and boost on rise and control descent. Pt with multiple family members present and supportive at bedside. Educated pt on importance of continued mobility and appropriate activity progression post acutely with pt and family verbalizing understanding. Pt continues to benefit from skilled PT services to progress toward functional mobility goals.      Assistance Recommended at Discharge Frequent or constant Supervision/Assistance  If plan is discharge home, recommend the following:  Can travel by private vehicle    A little help with walking and/or transfers;Assistance with cooking/housework;Assist for transportation;Help with stairs or ramp for entrance      Equipment Recommendations  None recommended by PT    Recommendations for Other Services       Precautions / Restrictions Precautions Precautions: Fall Restrictions Weight Bearing Restrictions: No     Mobility  Bed Mobility Overal bed mobility: Needs Assistance             General bed mobility comments: pt up in  recliner on arrival    Transfers Overall transfer level: Needs assistance Equipment used: Rolling walker (2 wheels) Transfers: Sit to/from Stand Sit to Stand: Min guard           General transfer comment: no physical assist; pt slowly rises    Ambulation/Gait Ambulation/Gait assistance: Min guard, Supervision Gait Distance (Feet): 20 Feet (x2) Assistive device: Rollator (4 wheels) Gait Pattern/deviations: Step-through pattern, Decreased stride length, Trunk flexed Gait velocity: decr     General Gait Details: pt manuevering around obstacles without assist; required cues for which way to turn to keep from getting wrapped up in lines/IV   Stairs Stairs: Yes Stairs assistance: Min guard, Min assist Stair Management: One rail Right, Step to pattern, Sideways Number of Stairs: 2 General stair comments: pt able to step up onto/off of single step sideways (to simulate home set up) with bil UE support on rail of bed x2 trials, light assist to boost up and steady   Wheelchair Mobility     Tilt Bed    Modified Rankin (Stroke Patients Only)       Balance Overall balance assessment: Needs assistance Sitting-balance support: No upper extremity supported, Feet supported Sitting balance-Leahy Scale: Fair     Standing balance support: No upper extremity supported, During functional activity Standing balance-Leahy Scale: Fair Standing balance comment: stood and gathered his gown prior to sitting on toilet                            Cognition Arousal/Alertness: Awake/alert Behavior During Therapy: WFL for tasks assessed/performed Overall Cognitive Status: Within Functional Limits  for tasks assessed                                 General Comments: following commands without need for repetition; HOH occasionally a barrier        Exercises      General Comments General comments (skin integrity, edema, etc.): Bp soft but stable throughout  session, pt without c/o dizziness or lightheadedness      Pertinent Vitals/Pain Pain Assessment Faces Pain Scale: Hurts a little bit Pain Location: stomach Pain Descriptors / Indicators: Sore    Home Living                          Prior Function            PT Goals (current goals can now be found in the care plan section) Acute Rehab PT Goals Patient Stated Goal: return home PT Goal Formulation: With patient/family Time For Goal Achievement: 05/27/23 Progress towards PT goals: Progressing toward goals    Frequency    Min 3X/week      PT Plan Current plan remains appropriate    Co-evaluation              AM-PAC PT "6 Clicks" Mobility   Outcome Measure  Help needed turning from your back to your side while in a flat bed without using bedrails?: None Help needed moving from lying on your back to sitting on the side of a flat bed without using bedrails?: A Little Help needed moving to and from a bed to a chair (including a wheelchair)?: A Little Help needed standing up from a chair using your arms (e.g., wheelchair or bedside chair)?: A Little Help needed to walk in hospital room?: A Little Help needed climbing 3-5 steps with a railing? : A Little 6 Click Score: 19    End of Session   Activity Tolerance: Patient tolerated treatment well Patient left: with family/visitor present;with call bell/phone within reach;in chair;with nursing/sitter in room Nurse Communication: Mobility status PT Visit Diagnosis: Difficulty in walking, not elsewhere classified (R26.2)     Time: 6213-0865 PT Time Calculation (min) (ACUTE ONLY): 30 min  Charges:    $Gait Training: 23-37 mins PT General Charges $$ ACUTE PT VISIT: 1 Visit                     Tobi Bastos R. PTA Acute Rehabilitation Services Office: [phone]   Catalina Antigua 05/14/2023, 12:54 PM

## 2023-05-14 NOTE — Progress Notes (Signed)
2 Days Post-Op  Subjective: CC: Family at bedside.  Some expected soreness that is well controlled.  Tolerating soft diet without n/v. No flatus or bm yesterday but did the day before.  Mobilizing with therapies.   Objective: Vital signs in last 24 hours: Temp:  [97.3 F (36.3 C)-98.4 F (36.9 C)] 98 F (36.7 C) (07/19 0400) Pulse Rate:  [88-100] 100 (07/19 0400) Resp:  [16-31] 20 (07/19 0400) BP: (95-112)/(55-64) 112/64 (07/19 0400) SpO2:  [94 %-97 %] 94 % (07/19 0400) Last BM Date : 05/12/23  Intake/Output from previous day: 07/18 0701 - 07/19 0700 In: 820 [P.O.:820] Out: -  Intake/Output this shift: No intake/output data recorded.  PE: Gen:  Alert, NAD, pleasant Abd: Soft, ND, NT, +BS, b/l inguinal incisions cdi  Lab Results:  Recent Labs    05/13/23 0331 05/14/23 0735  WBC 8.7 7.8  HGB 8.7* 8.7*  HCT 27.3* 28.1*  PLT 209 198   BMET Recent Labs    05/13/23 0331 05/14/23 0735  NA 134* 134*  K 4.3 3.8  CL 105 100  CO2 22 24  GLUCOSE 137* 121*  BUN 11 13  CREATININE 0.76 0.67  CALCIUM 8.1* 8.2*   PT/INR No results for input(s): "LABPROT", "INR" in the last 72 hours. CMP     Component Value Date/Time   NA 134 (L) 05/14/2023 0735   NA 137 03/05/2023 1412   K 3.8 05/14/2023 0735   CL 100 05/14/2023 0735   CO2 24 05/14/2023 0735   GLUCOSE 121 (H) 05/14/2023 0735   BUN 13 05/14/2023 0735   BUN 28 03/05/2023 1412   CREATININE 0.67 05/14/2023 0735   CREATININE 0.73 07/20/2017 0949   CALCIUM 8.2 (L) 05/14/2023 0735   PROT 6.7 05/10/2023 0918   PROT 6.8 02/13/2022 1236   ALBUMIN 3.9 05/10/2023 0918   ALBUMIN 4.2 02/13/2022 1236   AST 30 05/10/2023 0918   ALT 14 05/10/2023 0918   ALKPHOS 38 05/10/2023 0918   BILITOT 1.2 05/10/2023 0918   BILITOT 0.9 02/13/2022 1236   GFRNONAA >60 05/14/2023 0735   GFRNONAA 82 07/20/2017 0949   GFRAA 87 11/29/2020 1601   GFRAA 95 07/20/2017 0949   Lipase     Component Value Date/Time   LIPASE 28  05/10/2023 0918    Studies/Results: DG Chest Port 1 View  Result Date: 05/13/2023 CLINICAL DATA:  Shortness of breath. EXAM: PORTABLE CHEST 1 VIEW COMPARISON:  05/11/2023 FINDINGS: The cardio pericardial silhouette is enlarged. Interval slight increase in left base atelectasis with probable small left pleural effusion. Interstitial markings are diffusely coarsened with chronic features. Bones are diffusely demineralized. Telemetry leads overlie the chest. IMPRESSION: Interval slight increase in left base atelectasis with probable small left pleural effusion. Electronically Signed   By: Kennith Center M.D.   On: 05/13/2023 06:53    Anti-infectives: Anti-infectives (From admission, onward)    Start     Dose/Rate Route Frequency Ordered Stop   05/10/23 2200  amoxicillin (AMOXIL) capsule 500 mg        500 mg Oral Every 12 hours 05/10/23 1925 05/12/23 0913        Assessment/Plan POD 2 s/p bilateral inguinal hernia repairs with mesh by Dr. Carolynne Edouard on 7/17 - Tolerating soft diet without n/v. Was having bowel function POD1 - Incisions cdi - Pulm toilet - Mobilize, PT - Will reach out to [facility] to see when they are expecting he will d/c from their standpoint. He appears stable post op from our  standpoint.   FEN - Soft diet. IVF per [facility] VTE - SCDs, Eliquis ID - Peri-op abx. None currently.  Foley - None, voiding   LOS: 4 days    Jacinto Halim , [facility] Surgery 05/14/2023, 9:43 AM Please see Amion for pager number during day hours 7:00am-4:30pm

## 2023-05-14 NOTE — Progress Notes (Signed)
   05/14/23 2000  Assess: MEWS Score  BP 115/62  MAP (mmHg) 79  Pulse Rate 91  ECG Heart Rate 90  Resp (!) 32  SpO2 92 %  Assess: MEWS Score  MEWS Temp 0  MEWS Systolic 0  MEWS Pulse 0  MEWS RR 2  MEWS LOC 0  MEWS Score 2  MEWS Score Color Yellow  Assess: if the MEWS score is Yellow or Red  Were vital signs taken at a resting state? Yes  Focused Assessment No change from prior assessment  Does the patient meet 2 or more of the SIRS criteria? No  MEWS guidelines implemented  No, previously yellow, continue vital signs every 4 hours  Assess: SIRS CRITERIA  SIRS Temperature  0  SIRS Pulse 0  SIRS Respirations  1  SIRS WBC 0  SIRS Score Sum  1

## 2023-05-14 NOTE — Plan of Care (Signed)
  Problem: Coping: Goal: Ability to adjust to condition or change in health will improve Outcome: Progressing   Problem: Fluid Volume: Goal: Ability to maintain a balanced intake and output will improve Outcome: Progressing   Problem: Health Behavior/Discharge Planning: Goal: Ability to identify and utilize available resources and services will improve Outcome: Progressing   Problem: Safety: Goal: Ability to remain free from injury will improve Outcome: Progressing

## 2023-05-14 NOTE — Plan of Care (Signed)

## 2023-05-14 NOTE — TOC Progression Note (Signed)
Transition of Care [facility]) - Progression Note    Patient Details  Name: Dakota [name] MRN: [id number] Date of Birth: 10-12-1926  [facility]) CM/SW Contact  Gordy Clement, RN Phone Number: 05/14/2023, 1:37 PM  Clinical Narrative:     Patient is being recommended Home Health OT.  Patient will receive services from [facility]- He was receiving PT earlier in the year from them. Order in.  Family will transport at DC.       Barriers to Discharge: Continued Medical Work up  Expected Discharge Plan and Services       Living arrangements for the past 2 months: Apartment                                       Social Determinants of Health (SDOH) Interventions SDOH Screenings   Food Insecurity: No Food Insecurity (05/10/2023)  Housing: Low Risk  (05/10/2023)  Transportation Needs: No Transportation Needs (05/10/2023)  Utilities: Not At Risk (05/10/2023)  Tobacco Use: Low Risk  (05/12/2023)    Readmission Risk Interventions     No data to display

## 2023-05-15 ENCOUNTER — Inpatient Hospital Stay (HOSPITAL_COMMUNITY): Payer: Medicare HMO

## 2023-05-15 DIAGNOSIS — K56609 Unspecified intestinal obstruction, unspecified as to partial versus complete obstruction: Secondary | ICD-10-CM | POA: Diagnosis not present

## 2023-05-15 LAB — CBC WITH DIFFERENTIAL/PLATELET
Abs Immature Granulocytes: 0.02 10*3/uL (ref 0.00–0.07)
Basophils Absolute: 0 10*3/uL (ref 0.0–0.1)
Basophils Relative: 0 %
Eosinophils Absolute: 0.2 10*3/uL (ref 0.0–0.5)
Eosinophils Relative: 2 %
HCT: 26.7 % — ABNORMAL LOW (ref 39.0–52.0)
Hemoglobin: 8.5 g/dL — ABNORMAL LOW (ref 13.0–17.0)
Immature Granulocytes: 0 %
Lymphocytes Relative: 30 %
Lymphs Abs: 2.3 10*3/uL (ref 0.7–4.0)
MCH: 20 pg — ABNORMAL LOW (ref 26.0–34.0)
MCHC: 31.8 g/dL (ref 30.0–36.0)
MCV: 63 fL — ABNORMAL LOW (ref 80.0–100.0)
Monocytes Absolute: 0.9 10*3/uL (ref 0.1–1.0)
Monocytes Relative: 11 %
Neutro Abs: 4.2 10*3/uL (ref 1.7–7.7)
Neutrophils Relative %: 57 %
Platelets: 215 10*3/uL (ref 150–400)
RBC: 4.24 MIL/uL (ref 4.22–5.81)
RDW: 16 % — ABNORMAL HIGH (ref 11.5–15.5)
WBC: 7.5 10*3/uL (ref 4.0–10.5)
nRBC: 0 % (ref 0.0–0.2)

## 2023-05-15 LAB — BASIC METABOLIC PANEL
Anion gap: 6 (ref 5–15)
BUN: 13 mg/dL (ref 8–23)
CO2: 26 mmol/L (ref 22–32)
Calcium: 8.4 mg/dL — ABNORMAL LOW (ref 8.9–10.3)
Chloride: 106 mmol/L (ref 98–111)
Creatinine, Ser: 0.69 mg/dL (ref 0.61–1.24)
GFR, Estimated: >60 mL/min (ref >60)
Glucose, Bld: 116 mg/dL — ABNORMAL HIGH (ref 70–99)
Potassium: 3.7 mmol/L (ref 3.5–5.1)
Sodium: 138 mmol/L (ref 135–145)

## 2023-05-15 LAB — BRAIN NATRIURETIC PEPTIDE: B Natriuretic Peptide: 1110.2 pg/mL — ABNORMAL HIGH (ref 0.0–100.0)

## 2023-05-15 LAB — GLUCOSE, CAPILLARY
Glucose-Capillary: 122 mg/dL — ABNORMAL HIGH (ref 70–99)
Glucose-Capillary: 127 mg/dL — ABNORMAL HIGH (ref 70–99)
Glucose-Capillary: 130 mg/dL — ABNORMAL HIGH (ref 70–99)
Glucose-Capillary: 133 mg/dL — ABNORMAL HIGH (ref 70–99)
Glucose-Capillary: 178 mg/dL — ABNORMAL HIGH (ref 70–99)

## 2023-05-15 LAB — C-REACTIVE PROTEIN: CRP: 0.9 mg/dL (ref <1.0)

## 2023-05-15 LAB — MAGNESIUM: Magnesium: 1.9 mg/dL (ref 1.7–2.4)

## 2023-05-15 LAB — PROCALCITONIN: Procalcitonin: <0.1 ng/mL

## 2023-05-15 MED ORDER — DOCUSATE SODIUM 100 MG PO CAPS
200.0000 mg | ORAL_CAPSULE | Freq: Two times a day (BID) | ORAL | Status: DC
  Administered 2023-05-15 – 2023-05-17 (×5): 200 mg via ORAL
  Filled 2023-05-15 (×5): qty 2

## 2023-05-15 MED ORDER — POLYETHYLENE GLYCOL 3350 17 G PO PACK
17.0000 g | PACK | Freq: Two times a day (BID) | ORAL | Status: DC
  Administered 2023-05-15 – 2023-05-17 (×5): 17 g via ORAL
  Filled 2023-05-15 (×5): qty 1

## 2023-05-15 MED ORDER — APIXABAN 2.5 MG PO TABS: 2.5000 mg | ORAL_TABLET | Freq: Two times a day (BID) | ORAL | Status: DC

## 2023-05-15 NOTE — Progress Notes (Signed)
PROGRESS NOTE                                                                                                                                                                                                             Patient Demographics:    Dakota [name], is a 89 y.o. male, DOB - 17-Oct-1926, EXB:[id number]  Outpatient Primary MD for the patient is Tisovec, Adelfa Koh, MD    LOS - 5  Admit date - 05/10/2023    Chief Complaint  Patient presents with   Hernia   Chest Pain       Brief Narrative (HPI from H&P)   89 y.o. male with medical history significant of chronic right inguinal hernia, PAF on Eliquis, CAD status post CABG x 4, chronic HFrEF LVEF 30-50%, HTN, IIDM, presented with recurrent nonreducible right hernia and abdominal pain.    Subjective:   Patient in bed, appears comfortable, denies any headache, no fever, no chest pain or pressure, no shortness of breath , no abdominal pain. No new focal weakness. No BM, passing some flatus.   Assessment  & Plan :   SBO, obstructed right inguinal hernia -  seen by CCS, he has bilateral hernias with right more symptomatic than left, underwent bilateral inguinal hernia repair by Dr. Chevis Pretty on 05/12/2023, placed on regular diet by general surgery I have transitioned him to soft consistency for ease of he swallowing, advance activity, encouraged to sit in recliner in daytime,IS, PT OT. Seems to have tolerated the procedure well but ++ frail at baseline, weak cough reflex.  Clinically looks better on 05/15/2023, if continues to stay stable likely discharge home on 05/16/2023.    Paroxysmal A-fib with RVR - seen by cardiology, low-dose oral beta-blocker, [place] vas 2 score of greater than 6, continue beta-blocker at home dose, resume Eliquis, appreciate cardiology input, stable TSH, echo noted.  Chronic systolic heart failure EF now improved to 45% from 40% 2 years ago- he is currently  compensated seen by cardiology, on low-dose beta-blocker and Imdur, blood pressure low postop on 05/13/2023 hence medications adjusted, ARB held, he also has at least moderate to severe AAS as well, hence will avoid rapid drop in preload, clinically compensated.    Patient on chronic steroids.  Placed on home dose steroids from 05/14/2023, stop IV steroids on 05/13/2023.  CAD  CABG  - No chest pains, troponin negative x 2, No acute concerns continue statin and beta-blocker as tolerated.   IIDM  -SSI    CBG (last 3)  Recent Labs    05/14/23 1704 05/15/23 0009 05/15/23 0656  GLUCAP 115* 127* 130*   Lab Results  Component Value Date   TSH 0.922 05/11/2023         Condition - Extremely Guarded  Family Communication  : Daughter bedside 05/11/2023, 05/12/23, 05/13/2023, 05/14/23, 05/15/2023  Code Status : Full code  Consults  : Cardiology, CCS  PUD Prophylaxis : PPI   Procedures  :     Bilateral inguinal hernia repair on 05/12/2023 by Dr. Chevis Pretty.    TTE - 1. Technically difficult study.  2. Left ventricular ejection fraction, by estimation, is 40 to 45%. The left ventricle has mildly decreased function. The left ventricle demonstrates global hypokinesis. There is mild concentric left ventricular hypertrophy. Left ventricular diastolic function could not be evaluated. Significant dyssynchrony due to LBBB.  3. Right ventricular systolic function is normal. The right ventricular size is normal.  4. There is severe calcifcation of the aortic valve. There is severe thickening of the aortic valve. Aortic valve regurgitation is not visualized. The aortic valve appears severly stenotic by visualization. There was limited/incomplete doppler evaluation for aortic stenosis with a mean gradient of .  5. The mitral valve is degenerative. Mild mitral valve regurgitation. Moderate to severe mitral annular calcification.  CT - 1. Small-bowel obstruction secondary to right inguinal hernia. 2. Small  volume ascites. 3. Small pleural effusions. 4.  Aortic Atherosclerosis      Disposition Plan  :    Status is: Inpatient  DVT Prophylaxis  :    apixaban (ELIQUIS) tablet 2.5 mg Start: 05/13/23 1115 apixaban (ELIQUIS) tablet 2.5 mg    Lab Results  Component Value Date   PLT 215 05/15/2023    Diet :  Diet Order             DIET SOFT Fluid consistency: Thin  Diet effective now                    Inpatient Medications  Scheduled Meds:  apixaban  2.5 mg Oral BID   finasteride  5 mg Oral QHS   insulin aspart  0-9 Units Subcutaneous Q6H   isosorbide mononitrate  15 mg Oral Daily   ketorolac  1 drop Both Eyes QID   metoprolol tartrate  12.5 mg Oral BID   mirabegron ER  50 mg Oral QHS   pantoprazole  40 mg Oral Daily   pravastatin  80 mg Oral QHS   prednisoLONE acetate  1 drop Left Eye QID   Continuous Infusions:   PRN Meds:.acetaminophen, albuterol, metoprolol tartrate, nitroGLYCERIN, [DISCONTINUED] ondansetron **OR** ondansetron (ZOFRAN) IV, oxyCODONE  Antibiotics  :    Anti-infectives (From admission, onward)    Start     Dose/Rate Route Frequency Ordered Stop   05/10/23 2200  amoxicillin (AMOXIL) capsule 500 mg        500 mg Oral Every 12 hours 05/10/23 1925 05/12/23 0913         Objective:   Vitals:   05/14/23 2000 05/15/23 0010 05/15/23 0514 05/15/23 0800  BP: 115/62 115/62 114/74 126/69  Pulse: 91 91 93 88  Resp: (!) 32 18 (!) 23 19  Temp: 98 F (36.7 C) 98 F (36.7 C) 98.2 F (36.8 C)   TempSrc:      SpO2: 96%  96% 97% 97%  Weight:        Wt Readings from Last 3 Encounters:  05/11/23 49.1 kg  03/05/23 49.1 kg  11/25/22 48.4 kg     Intake/Output Summary (Last 24 hours) at 05/15/2023 0925 Last data filed at 05/15/2023 0500 Gross per 24 hour  Intake --  Output 150 ml  Net -150 ml     Physical Exam  Awake , weak frail elderly male, weak cough, No new F.N deficits,   [place].AT,PERRAL Supple Neck, No JVD,   Symmetrical Chest wall  movement, Mod air movement bilaterally,  RRR,No Gallops,Rubs or new Murmurs,  +ve B.Sounds, Abd Soft, mild RLQ tenderness,   No Cyanosis, Clubbing or edema        Data Review:    Recent Labs  Lab 05/10/23 0918 05/11/23 0156 05/12/23 0549 05/13/23 0331 05/14/23 0735 05/15/23 0234  WBC 9.2 6.5 5.4 8.7 7.8 7.5  HGB 9.7* 8.6* 8.8* 8.7* 8.7* 8.5*  HCT 31.7* 27.9* 27.9* 27.3* 28.1* 26.7*  PLT 220 196 190 209 198 215  MCV 63.5* 62.1* 63.0* 61.6* 63.3* 63.0*  MCH 19.4* 19.2* 19.9* 19.6* 19.6* 20.0*  MCHC 30.6 30.8 31.5 31.9 31.0 31.8  RDW 16.2* 15.9* 15.8* 15.7* 15.7* 16.0*  LYMPHSABS 0.8  --  1.6 1.4 1.9 2.3  MONOABS 0.3  --  0.6 0.8 0.7 0.9  EOSABS 0.0  --  0.1 0.0 0.1 0.2  BASOSABS 0.0  --  0.0 0.0 0.0 0.0    Recent Labs  Lab 05/10/23 0918 05/10/23 1100 05/10/23 1239 05/10/23 1634 05/11/23 0156 05/11/23 0655 05/12/23 0549 05/13/23 0331 05/14/23 0735 05/15/23 0234  NA 138  --   --   --  137  --  138 134* 134* 138  K 4.6  --   --   --  3.8  --  3.6 4.3 3.8 3.7  CL 98  --   --   --  105  --  104 105 100 106  CO2 21*  --   --   --  24  --  26 22 24 26   ANIONGAP 19*  --   --   --  8  --  8 7 10 6   GLUCOSE 194*  --   --   --  104*  --  126* 137* 121* 116*  BUN 23  --   --   --  16  --  12 11 13 13   CREATININE 0.81  --   --   --  0.63  --  0.71 0.76 0.67 0.69  AST 30  --   --   --   --   --   --   --   --   --   ALT 14  --   --   --   --   --   --   --   --   --   ALKPHOS 38  --   --   --   --   --   --   --   --   --   BILITOT 1.2  --   --   --   --   --   --   --   --   --   ALBUMIN 3.9  --   --   --   --   --   --   --   --   --   CRP  --   --   --   --   --  0.6 <0.5 <0.5 0.8 0.9  PROCALCITON  --   --   --   --   --  <0.10 <0.10 <0.10 <0.10 <0.10  LATICACIDVEN  --   --  1.1 1.0  --   --   --   --   --   --   TSH  --   --   --   --   --  0.922  --   --   --   --   HGBA1C  --   --   --  6.0*  --   --   --   --   --   --   BNP  --    < >  --   --   --  824.9* 826.8*  1,183.7* 972.6* 1,110.2*  MG  --   --   --  1.9  --   --  1.9 1.7 1.7 1.9  CALCIUM 9.1  --   --   --  8.2*  --  8.7* 8.1* 8.2* 8.4*   < > = values in this interval not displayed.      Recent Labs  Lab 05/10/23 1239 05/10/23 1634 05/11/23 0156 05/11/23 0655 05/12/23 0549 05/13/23 0331 05/14/23 0735 05/15/23 0234  CRP  --   --   --  0.6 <0.5 <0.5 0.8 0.9  PROCALCITON  --   --   --  <0.10 <0.10 <0.10 <0.10 <0.10  LATICACIDVEN 1.1 1.0  --   --   --   --   --   --   TSH  --   --   --  0.922  --   --   --   --   HGBA1C  --  6.0*  --   --   --   --   --   --   BNP  --   --   --  824.9* 826.8* 1,183.7* 972.6* 1,110.2*  MG  --  1.9  --   --  1.9 1.7 1.7 1.9  CALCIUM  --   --  8.2*  --  8.7* 8.1* 8.2* 8.4*      Lab Results  Component Value Date   HGBA1C 6.0 (H) 05/10/2023    Radiology Reports DG Chest Port 1 View  Result Date: 05/15/2023 CLINICAL DATA:  Shortness of breath EXAM: PORTABLE CHEST 1 VIEW COMPARISON:  Two days ago FINDINGS: Low volume chest. Chronic interstitial coarsening. There is no edema, consolidation, or pneumothorax. Trace pleural effusions confirmed on recent abdominal CT. Cardiomegaly. There is been prior CABG. IMPRESSION: Cardiomegaly and trace pleural effusions as seen on recent abdominal CT. No new or progressive finding. Electronically Signed   By: Tiburcio Pea M.D.   On: 05/15/2023 06:55   DG Chest Port 1 View  Result Date: 05/13/2023 CLINICAL DATA:  Shortness of breath. EXAM: PORTABLE CHEST 1 VIEW COMPARISON:  05/11/2023 FINDINGS: The cardio pericardial silhouette is enlarged. Interval slight increase in left base atelectasis with probable small left pleural effusion. Interstitial markings are diffusely coarsened with chronic features. Bones are diffusely demineralized. Telemetry leads overlie the chest. IMPRESSION: Interval slight increase in left base atelectasis with probable small left pleural effusion. Electronically Signed   By: Kennith Center M.D.    On: 05/13/2023 06:53   ECHOCARDIOGRAM COMPLETE  Result Date: 05/11/2023    ECHOCARDIOGRAM REPORT   Patient Name:   ESAU FRIDMAN Date of Exam: 05/11/2023 Medical Rec #:  [id number]    Height:  62.0 in Accession #:    [id number]   Weight:       108.2 lb Date of Birth:  07-11-1926     BSA:          1.473 m Patient Age:    89 years     BP:           117/78 mmHg Patient Gender: M            HR:           80 bpm. Exam Location:  Inpatient Procedure: 2D Echo, Color Doppler and Cardiac Doppler Indications:    CHF  History:        Patient has prior history of Echocardiogram examinations, most                 recent 04/14/2021. CAD, SBO and COPD; Risk Factors:Dyslipidemia                 and Diabetes.  Sonographer:    Milbert Coulter Referring Phys: [id number] Emeline General IMPRESSIONS  1. Technically difficult study.  2. Left ventricular ejection fraction, by estimation, is 40 to 45%. The left ventricle has mildly decreased function. The left ventricle demonstrates global hypokinesis. There is mild concentric left ventricular hypertrophy. Left ventricular diastolic function could not be evaluated. Significant dyssynchrony due to LBBB.  3. Right ventricular systolic function is normal. The right ventricular size is normal.  4. There is severe calcifcation of the aortic valve. There is severe thickening of the aortic valve. Aortic valve regurgitation is not visualized. The aortic valve appears severly stenotic by visualization. There was limited/incomplete doppler evaluation for aortic stenosis with a mean gradient of .  5. The mitral valve is degenerative. Mild mitral valve regurgitation. Moderate to severe mitral annular calcification. FINDINGS  Left Ventricle: Left ventricular ejection fraction, by estimation, is 40 to 45%. The left ventricle has mildly decreased function. The left ventricle demonstrates global hypokinesis. The left ventricular internal cavity size was normal in size. There is  mild concentric left  ventricular hypertrophy. Abnormal (paradoxical) septal motion, consistent with left bundle branch block. Left ventricular diastolic function could not be evaluated due to nondiagnostic images. Left ventricular diastolic function could not be evaluated. Right Ventricle: The right ventricular size is normal. No increase in right ventricular wall thickness. Right ventricular systolic function is normal. Left Atrium: Left atrial size was normal in size. Right Atrium: Right atrial size was normal in size. Pericardium: There is no evidence of pericardial effusion. Mitral Valve: The mitral valve is degenerative in appearance. There is mild thickening of the mitral valve leaflet(s). There is mild calcification of the mitral valve leaflet(s). Moderate to severe mitral annular calcification. Mild mitral valve regurgitation. Tricuspid Valve: The tricuspid valve is grossly normal. Tricuspid valve regurgitation is mild. Aortic Valve: The aortic valve is calcified. There is severe calcifcation of the aortic valve. There is severe thickening of the aortic valve. There is moderate aortic valve annular calcification. Aortic valve regurgitation is not visualized. Aortic valve mean gradient measures 5.0 mmHg. Aortic valve peak gradient measures 9.2 mmHg. Aortic valve area, by VTI measures 1.98 cm. Pulmonic Valve: The pulmonic valve was not well visualized. Pulmonic valve regurgitation is trivial. Aorta: The aortic root and ascending aorta are structurally normal, with no evidence of dilitation. IAS/Shunts: No atrial level shunt detected by color flow Doppler.  LEFT VENTRICLE PLAX 2D LVIDd:         4.00 cm   Diastology LVIDs:  3.00 cm   LV e' medial: 7.29 cm/s LV PW:         1.00 cm LV IVS:        1.00 cm LVOT diam:     2.10 cm LV SV:         41 LV SV Index:   28 LVOT Area:     3.46 cm  RIGHT VENTRICLE RV S prime:     6.53 cm/s TAPSE (M-mode): 1.3 cm LEFT ATRIUM             Index        RIGHT ATRIUM           Index LA Vol  (A2C):   61.6 ml 41.83 ml/m  RA Area:     25.70 cm LA Vol (A4C):   65.5 ml 44.48 ml/m  RA Volume:   76.70 ml  52.08 ml/m LA Biplane Vol: 67.5 ml 45.84 ml/m  AORTIC VALVE AV Area (Vmax):    1.66 cm AV Area (Vmean):   1.67 cm AV Area (VTI):     1.98 cm AV Vmax:           152.00 cm/s AV Vmean:          97.700 cm/s AV VTI:            0.206 m AV Peak Grad:      9.2 mmHg AV Mean Grad:      5.0 mmHg LVOT Vmax:         72.80 cm/s LVOT Vmean:        47.100 cm/s LVOT VTI:          0.118 m LVOT/AV VTI ratio: 0.57  AORTA Ao Root diam: 3.60 cm Ao Asc diam:  3.00 cm TRICUSPID VALVE TR Peak grad:   35.3 mmHg TR Vmax:        297.00 cm/s  SHUNTS Systemic VTI:  0.12 m Systemic Diam: 2.10 cm Aditya Sabharwal Electronically signed by [name] Signature Date/Time: 05/11/2023/6:14:34 PM    Final       Signature  -   Susa Raring M.D on 05/15/2023 at 9:25 AM   -  To page go to [email or web address]

## 2023-05-15 NOTE — TOC Progression Note (Signed)
Transition of Care [facility]) - Progression Note    Patient Details  Name: Dakota [name] MRN: [id number] Date of Birth: January 04, 1926  [facility] CM/SW Contact  Lawerance Sabal, RN Phone Number: 05/15/2023, 12:37 PM  Clinical Narrative:     Sherron Monday w patient's daughter at bedside. Patient has cane and rollator, discussed DME needs, they would like RW, ordered through [employer], it will be delivered to the room  Discussed HH needs, they acknowledge using [facility] earlier this year but wanted to try another company and decision was made for referral to be placed to [facility].  Family will transport home.  No other TOC needs identified.   Expected Discharge Plan: Home w Home Health Services Barriers to Discharge: Continued Medical Work up  Expected Discharge Plan and Services   Discharge Planning Services: CM Consult Post Acute Care Choice: Durable Medical Equipment, Home Health Living arrangements for the past 2 months: Apartment                 DME Arranged: Walker rolling DME Agency: [employer] Date DME Agency Contacted: 05/15/23 Time DME Agency Contacted: 1235 Representative spoke with at DME Agency: Leavy Cella HH Arranged: PT HH Agency: [facility] Date [facility] Agency Contacted: 05/15/23 Time HH Agency Contacted: 1237 Representative spoke with at [facility] Agency: Kandee Keen   Social Determinants of Health (SDOH) Interventions SDOH Screenings   Food Insecurity: No Food Insecurity (05/10/2023)  Housing: Low Risk  (05/10/2023)  Transportation Needs: No Transportation Needs (05/10/2023)  Utilities: Not At Risk (05/10/2023)  Tobacco Use: Low Risk  (05/12/2023)    Readmission Risk Interventions     No data to display

## 2023-05-15 NOTE — Progress Notes (Addendum)
Occupational Therapy Treatment Patient Details Name: Dakota [name] MRN: [id number] DOB: Feb 17, 1926 Today's Date: 05/15/2023   History of present illness 89 y.o. male presented 05/10/23 with recurrent nonreducible right hernia and abdominal pain. Attempting medical management. Now s/p bil inguinal hernia repairs 7/17. PMH significant of chronic right inguinal hernia, PAF on Eliquis, CAD status post CABG x 4, chronic HFrEF LVEF 30-50%, HTN, IIDM,   OT comments  Pt agreeable to participation in skilled OT session. OT educated pt in techniques for increased safety and independence with ADLs and answered pt family's general questions regarding DME options for the home to increase pt safety. Pt currently demonstrates ability to complete ADLs with Set up to Min assist and functional transfers/mobility with a RW with Min guard to Min assist. Pt currently requires increased time and occasional rest breaks during functional tasks. Pt is making progress toward OT goals and will benefit from continued acute skilled OT services to address deficits, decrease caregiver burden, and increase safety and independence with ADLs and functional transfers/mobility. Post acute discharge, pt will benefit from continued skilled OT services in the home.    Recommendations for follow up therapy are one component of a multi-disciplinary discharge planning process, led by the attending physician.  Recommendations may be updated based on patient status, additional functional criteria and insurance authorization.    Assistance Recommended at Discharge Intermittent Supervision/Assistance  Patient can return home with the following  A little help with walking and/or transfers;A little help with bathing/dressing/bathroom;Assistance with cooking/housework;Assist for transportation;Help with stairs or ramp for entrance;Assistance with feeding (Set up for self feeding)   Equipment Recommendations  Other (comment) (transport chair, bed  transfer handle with feet)    Recommendations for Other Services      Precautions / Restrictions Precautions Precautions: Fall Restrictions Weight Bearing Restrictions: No       Mobility Bed Mobility Overal bed mobility: Needs Assistance Bed Mobility: Sidelying to Sit, Sit to Sidelying   Sidelying to sit: Min assist, HOB elevated     Sit to sidelying: Min assist General bed mobility comments: Cues for technique; Max assist to scoot up in bed    Transfers Overall transfer level: Needs assistance Equipment used: Rolling walker (2 wheels) Transfers: Sit to/from Stand, Bed to chair/wheelchair/BSC Sit to Stand: Min guard, Min assist (Largely Min guard with Min assist to power up from bed)     Step pivot transfers: Min guard           Balance Overall balance assessment: Needs assistance Sitting-balance support: Single extremity supported, Bilateral upper extremity supported, No upper extremity supported, Feet supported Sitting balance-Leahy Scale: Fair     Standing balance support: Single extremity supported, No upper extremity supported, During functional activity, Reliant on assistive device for balance Standing balance-Leahy Scale: Fair                             ADL either performed or assessed with clinical judgement   ADL Overall ADL's : Needs assistance/impaired     Grooming: Wash/dry hands;Min guard;Standing           Upper Body Dressing : Min guard;Sitting;Cueing for sequencing       Toilet Transfer: Min guard;Ambulation;Rolling walker (2 wheels) (BSC placed over toilet)   Toileting- Clothing Manipulation and Hygiene: Min guard;Sit to/from stand       Functional mobility during ADLs: Min guard;Rolling walker (2 wheels);Cueing for safety;Cueing for sequencing (with increased time) General ADL Comments:  Pt requires increased time and occasional rest breaks during functional tasks secondary to decreased activity tolerance. OT answered  pt's family's general questions regarding DME in the home to increase pt safety, including use of bed tranfer rail or large platform step with B hand rails for transferring in/out of higher bed.    Extremity/Trunk Assessment Upper Extremity Assessment Upper Extremity Assessment: Generalized weakness   Lower Extremity Assessment Lower Extremity Assessment: Defer to PT evaluation        Vision       Perception     Praxis      Cognition Arousal/Alertness: Awake/alert Behavior During Therapy: WFL for tasks assessed/performed Overall Cognitive Status: Within Functional Limits for tasks assessed                                 General Comments: Cognition overall WFL this session with pt requiring occasional cues for sequencing and safety during funcitonal tasks. Pt pleasant throughout session.        Exercises      Shoulder Instructions       General Comments VSS on RA throughout session. Multiple family members present throughout session. RN present during a portion of sesison.    Pertinent Vitals/ Pain       Pain Assessment Pain Assessment: No/denies pain Pain Intervention(s): Monitored during session  Home Living                                          Prior Functioning/Environment              Frequency  Min 2X/week        Progress Toward Goals  OT Goals(current goals can now be found in the care plan section)  Progress towards OT goals: Progressing toward goals  Acute Rehab OT Goals Patient Stated Goal: to return home with family  Plan Discharge plan remains appropriate;Frequency remains appropriate    Co-evaluation                 AM-PAC OT "6 Clicks" Daily Activity     Outcome Measure   Help from another person eating meals?: A Little Help from another person taking care of personal grooming?: A Little Help from another person toileting, which includes using toliet, bedpan, or urinal?: A Little Help  from another person bathing (including washing, rinsing, drying)?: A Little Help from another person to put on and taking off regular upper body clothing?: A Little Help from another person to put on and taking off regular lower body clothing?: A Little 6 Click Score: 18    End of Session Equipment Utilized During Treatment: Gait belt;Rolling walker (2 wheels)  OT Visit Diagnosis: Unsteadiness on feet (R26.81);Muscle weakness (generalized) (M62.81)   Activity Tolerance Patient tolerated treatment well   Patient Left in bed;with call bell/phone within reach;with family/visitor present   Nurse Communication Mobility status        Time: 5427-0623 OT Time Calculation (min): 45 min  Charges: OT General Charges $OT Visit: 1 Visit OT Treatments $Self Care/Home Management : 38-52 mins  Nashea Chumney "Orson Eva., OTR/L, MA Acute Rehab [phone]   Lendon Colonel 05/15/2023, 6:19 PM

## 2023-05-16 DIAGNOSIS — K56609 Unspecified intestinal obstruction, unspecified as to partial versus complete obstruction: Secondary | ICD-10-CM | POA: Diagnosis not present

## 2023-05-16 LAB — CBC WITH DIFFERENTIAL/PLATELET
Abs Immature Granulocytes: 0.02 10*3/uL (ref 0.00–0.07)
Basophils Absolute: 0 10*3/uL (ref 0.0–0.1)
Basophils Relative: 1 %
Eosinophils Absolute: 0.2 10*3/uL (ref 0.0–0.5)
Eosinophils Relative: 3 %
HCT: 25.5 % — ABNORMAL LOW (ref 39.0–52.0)
Hemoglobin: 8 g/dL — ABNORMAL LOW (ref 13.0–17.0)
Immature Granulocytes: 0 %
Lymphocytes Relative: 27 %
Lymphs Abs: 1.7 10*3/uL (ref 0.7–4.0)
MCH: 19.4 pg — ABNORMAL LOW (ref 26.0–34.0)
MCHC: 31.4 g/dL (ref 30.0–36.0)
MCV: 61.9 fL — ABNORMAL LOW (ref 80.0–100.0)
Monocytes Absolute: 0.7 10*3/uL (ref 0.1–1.0)
Monocytes Relative: 11 %
Neutro Abs: 3.7 10*3/uL (ref 1.7–7.7)
Neutrophils Relative %: 58 %
Platelets: 223 10*3/uL (ref 150–400)
RBC: 4.12 MIL/uL — ABNORMAL LOW (ref 4.22–5.81)
RDW: 15.9 % — ABNORMAL HIGH (ref 11.5–15.5)
WBC: 6.4 10*3/uL (ref 4.0–10.5)
nRBC: 0 % (ref 0.0–0.2)

## 2023-05-16 LAB — RETICULOCYTES
Immature Retic Fract: 21.6 % — ABNORMAL HIGH (ref 2.3–15.9)
RBC.: 4.49 MIL/uL (ref 4.22–5.81)
Retic Count, Absolute: 101.9 10*3/uL (ref 19.0–186.0)
Retic Ct Pct: 2.3 % (ref 0.4–3.1)

## 2023-05-16 LAB — FOLATE: Folate: 28 ng/mL (ref >5.9)

## 2023-05-16 LAB — GLUCOSE, CAPILLARY
Glucose-Capillary: 119 mg/dL — ABNORMAL HIGH (ref 70–99)
Glucose-Capillary: 129 mg/dL — ABNORMAL HIGH (ref 70–99)
Glucose-Capillary: 153 mg/dL — ABNORMAL HIGH (ref 70–99)
Glucose-Capillary: 177 mg/dL — ABNORMAL HIGH (ref 70–99)

## 2023-05-16 LAB — IRON AND TIBC
Iron: 37 ug/dL — ABNORMAL LOW (ref 45–182)
Saturation Ratios: 13 % — ABNORMAL LOW (ref 17.9–39.5)
TIBC: 286 ug/dL (ref 250–450)
UIBC: 249 ug/dL

## 2023-05-16 LAB — VITAMIN B12: Vitamin B-12: 2094 pg/mL — ABNORMAL HIGH (ref 180–914)

## 2023-05-16 LAB — FERRITIN: Ferritin: 86 ng/mL (ref 24–336)

## 2023-05-16 LAB — PREPARE RBC (CROSSMATCH)

## 2023-05-16 MED ORDER — FOLIC ACID 1 MG PO TABS
1.0000 mg | ORAL_TABLET | Freq: Every day | ORAL | Status: DC
  Administered 2023-05-16 – 2023-05-17 (×2): 1 mg via ORAL
  Filled 2023-05-16 (×2): qty 1

## 2023-05-16 MED ORDER — FUROSEMIDE 10 MG/ML IJ SOLN
20.0000 mg | Freq: Once | INTRAMUSCULAR | Status: AC
  Administered 2023-05-16: 20 mg via INTRAVENOUS
  Filled 2023-05-16: qty 2

## 2023-05-16 MED ORDER — FERROUS SULFATE 325 (65 FE) MG PO TABS
325.0000 mg | ORAL_TABLET | Freq: Two times a day (BID) | ORAL | Status: DC
  Administered 2023-05-16 – 2023-05-17 (×2): 325 mg via ORAL
  Filled 2023-05-16 (×2): qty 1

## 2023-05-16 MED ORDER — POTASSIUM CHLORIDE CRYS ER 20 MEQ PO TBCR
40.0000 meq | EXTENDED_RELEASE_TABLET | Freq: Once | ORAL | Status: AC
  Administered 2023-05-16: 40 meq via ORAL
  Filled 2023-05-16: qty 2

## 2023-05-16 MED ORDER — SODIUM CHLORIDE 0.9% IV SOLUTION: Freq: Once | INTRAVENOUS | Status: AC

## 2023-05-16 NOTE — Progress Notes (Signed)
PROGRESS NOTE                                                                                                                                                                                                             Patient Demographics:    Dakota [name], is a 89 y.o. male, DOB - [birth year], ONG:[id number]  Outpatient Primary MD for the patient is Tisovec, Adelfa Koh, MD    LOS - 6  Admit date - 05/10/2023    Chief Complaint  Patient presents with   Hernia   Chest Pain       Brief Narrative (HPI from H&P)   89 y.o. male with medical history significant of chronic right inguinal hernia, PAF on Eliquis, CAD status post CABG x 4, chronic HFrEF LVEF 30-50%, HTN, IIDM, presented with recurrent nonreducible right hernia and abdominal pain.    Subjective:   Patient in bed, appears comfortable, denies any headache, no fever, no chest pain or pressure, no shortness of breath , no abdominal pain. No focal weakness.  Generalized weakness, agreeable to a unit of blood transfusion.   Assessment  & Plan :   SBO, obstructed right inguinal hernia -  seen by CCS, he has bilateral hernias with right more symptomatic than left, underwent bilateral inguinal hernia repair by Dr. Chevis Pretty on 05/12/2023, placed on regular diet by general surgery I have transitioned him to soft consistency for ease of he swallowing, advance activity, encouraged to sit in recliner in daytime,IS, PT OT. Seems to have tolerated the procedure well but ++ frail at baseline, weak cough reflex.  Clinically looks better on 05/15/2023, if continues to stay stable likely discharge home on 05/16/2023.    Paroxysmal A-fib with RVR - seen by cardiology, low-dose oral beta-blocker, [place] vas 2 score of greater than 6, continue beta-blocker at home dose, resume Eliquis, appreciate cardiology input, stable TSH, echo noted.  Chronic systolic heart failure EF now improved to 45% from 40%  2 years ago- he is currently compensated seen by cardiology, on low-dose beta-blocker and Imdur, blood pressure low postop on 05/13/2023 hence medications adjusted, ARB held, he also has at least moderate to severe AAS as well, hence will avoid rapid drop in preload, clinically compensated.    Anemia of iron deficiency, microcytic and chronic.  Some acute drop due to heme dilution  and frequent blood draws, 1 unit packed RBC transfusion on 05/16/2023, oral iron and folic acid supplementation as he does have generalized weakness and is weak and frail, not a candidate for colonoscopy or extensive workup due to frail status and advanced age.  Discussed with daughter agrees with the plan.  Already on PPI continue.  Patient on chronic steroids.  Placed on home dose steroids from 05/14/2023, stop IV steroids on 05/13/2023.  CAD CABG  - No chest pains, troponin negative x 2, No acute concerns continue statin and beta-blocker as tolerated.   Few drops of hematuria intermittently.  Likely due to some superficial injury from condom catheter.  No significant blood loss it was few drops at the end of urination, avoid condom catheter, hold Eliquis for a day, discussed with daughter, outpatient follow-up with PCP.    IIDM  -SSI    CBG (last 3)  Recent Labs    05/15/23 2350 05/16/23 0117 05/16/23 0552  GLUCAP 178* 153* 119*   Lab Results  Component Value Date   TSH 0.922 05/11/2023        Condition - Extremely Guarded  Family Communication  : Daughter bedside 05/11/2023, 05/12/23, 05/13/2023, 05/14/23, 05/15/2023, 05/16/23  Code Status : Full code  Consults  : Cardiology, CCS  PUD Prophylaxis : PPI   Procedures  :     Bilateral inguinal hernia repair on 05/12/2023 by Dr. Chevis Pretty.    TTE - 1. Technically difficult study.  2. Left ventricular ejection fraction, by estimation, is 40 to 45%. The left ventricle has mildly decreased function. The left ventricle demonstrates global hypokinesis. There is  mild concentric left ventricular hypertrophy. Left ventricular diastolic function could not be evaluated. Significant dyssynchrony due to LBBB.  3. Right ventricular systolic function is normal. The right ventricular size is normal.  4. There is severe calcifcation of the aortic valve. There is severe thickening of the aortic valve. Aortic valve regurgitation is not visualized. The aortic valve appears severly stenotic by visualization. There was limited/incomplete doppler evaluation for aortic stenosis with a mean gradient of .  5. The mitral valve is degenerative. Mild mitral valve regurgitation. Moderate to severe mitral annular calcification.  CT - 1. Small-bowel obstruction secondary to right inguinal hernia. 2. Small volume ascites. 3. Small pleural effusions. 4.  Aortic Atherosclerosis      Disposition Plan  :    Status is: Inpatient  DVT Prophylaxis  :    Place and maintain sequential compression device Start: 05/16/23 0601    Lab Results  Component Value Date   PLT 223 05/16/2023    Diet :  Diet Order             DIET SOFT Fluid consistency: Thin  Diet effective now                    Inpatient Medications  Scheduled Meds:  sodium chloride   Intravenous Once   docusate sodium  200 mg Oral BID   ferrous sulfate  325 mg Oral BID WC   finasteride  5 mg Oral QHS   folic acid  1 mg Oral Daily   insulin aspart  0-9 Units Subcutaneous Q6H   isosorbide mononitrate  15 mg Oral Daily   ketorolac  1 drop Both Eyes QID   metoprolol tartrate  12.5 mg Oral BID   mirabegron ER  50 mg Oral QHS   pantoprazole  40 mg Oral Daily   polyethylene glycol  17 g  Oral BID   pravastatin  80 mg Oral QHS   prednisoLONE acetate  1 drop Left Eye QID   Continuous Infusions:   PRN Meds:.acetaminophen, albuterol, metoprolol tartrate, nitroGLYCERIN, [DISCONTINUED] ondansetron **OR** ondansetron (ZOFRAN) IV, oxyCODONE  Antibiotics  :    Anti-infectives (From admission, onward)     Start     Dose/Rate Route Frequency Ordered Stop   05/10/23 2200  amoxicillin (AMOXIL) capsule 500 mg        500 mg Oral Every 12 hours 05/10/23 1925 05/12/23 0913         Objective:   Vitals:   05/15/23 0800 05/15/23 2000 05/16/23 0000 05/16/23 0400  BP: 126/69 (!) 125/92 120/76 128/63  Pulse: 88 98 92 84  Resp: 19 16 14 12   Temp:  98 F (36.7 C) 98.6 F (37 C) 98.3 F (36.8 C)  TempSrc:  Axillary Axillary Oral  SpO2: 97% 96% 100% 98%  Weight:        Wt Readings from Last 3 Encounters:  05/11/23 49.1 kg  03/05/23 49.1 kg  11/25/22 48.4 kg     Intake/Output Summary (Last 24 hours) at 05/16/2023 0846 Last data filed at 05/15/2023 1205 Gross per 24 hour  Intake --  Output 250 ml  Net -250 ml     Physical Exam  Awake , weak frail elderly male, weak cough, No new F.N deficits,   [place].AT,PERRAL Supple Neck, No JVD,   Symmetrical Chest wall movement, Mod air movement bilaterally,  RRR,No Gallops,Rubs or new Murmurs,  +ve B.Sounds, Abd Soft, mild RLQ tenderness,   No Cyanosis, Clubbing or edema        Data Review:    Recent Labs  Lab 05/12/23 0549 05/13/23 0331 05/14/23 0735 05/15/23 0234 05/16/23 0430  WBC 5.4 8.7 7.8 7.5 6.4  HGB 8.8* 8.7* 8.7* 8.5* 8.0*  HCT 27.9* 27.3* 28.1* 26.7* 25.5*  PLT 190 209 198 215 223  MCV 63.0* 61.6* 63.3* 63.0* 61.9*  MCH 19.9* 19.6* 19.6* 20.0* 19.4*  MCHC 31.5 31.9 31.0 31.8 31.4  RDW 15.8* 15.7* 15.7* 16.0* 15.9*  LYMPHSABS 1.6 1.4 1.9 2.3 1.7  MONOABS 0.6 0.8 0.7 0.9 0.7  EOSABS 0.1 0.0 0.1 0.2 0.2  BASOSABS 0.0 0.0 0.0 0.0 0.0    Recent Labs  Lab 05/10/23 0918 05/10/23 1100 05/10/23 1239 05/10/23 1634 05/11/23 0156 05/11/23 0655 05/12/23 0549 05/13/23 0331 05/14/23 0735 05/15/23 0234  NA 138  --   --   --  137  --  138 134* 134* 138  K 4.6  --   --   --  3.8  --  3.6 4.3 3.8 3.7  CL 98  --   --   --  105  --  104 105 100 106  CO2 21*  --   --   --  24  --  26 22 24 26   ANIONGAP 19*  --   --   --   8  --  8 7 10 6   GLUCOSE 194*  --   --   --  104*  --  126* 137* 121* 116*  BUN 23  --   --   --  16  --  12 11 13 13   CREATININE 0.81  --   --   --  0.63  --  0.71 0.76 0.67 0.69  AST 30  --   --   --   --   --   --   --   --   --  ALT 14  --   --   --   --   --   --   --   --   --   ALKPHOS 38  --   --   --   --   --   --   --   --   --   BILITOT 1.2  --   --   --   --   --   --   --   --   --   ALBUMIN 3.9  --   --   --   --   --   --   --   --   --   CRP  --   --   --   --   --  0.6 <0.5 <0.5 0.8 0.9  PROCALCITON  --   --   --   --   --  <0.10 <0.10 <0.10 <0.10 <0.10  LATICACIDVEN  --   --  1.1 1.0  --   --   --   --   --   --   TSH  --   --   --   --   --  0.922  --   --   --   --   HGBA1C  --   --   --  6.0*  --   --   --   --   --   --   BNP  --    < >  --   --   --  824.9* 826.8* 1,183.7* 972.6* 1,110.2*  MG  --   --   --  1.9  --   --  1.9 1.7 1.7 1.9  CALCIUM 9.1  --   --   --  8.2*  --  8.7* 8.1* 8.2* 8.4*   < > = values in this interval not displayed.      Recent Labs  Lab 05/10/23 1239 05/10/23 1634 05/11/23 0156 05/11/23 0655 05/12/23 0549 05/13/23 0331 05/14/23 0735 05/15/23 0234  CRP  --   --   --  0.6 <0.5 <0.5 0.8 0.9  PROCALCITON  --   --   --  <0.10 <0.10 <0.10 <0.10 <0.10  LATICACIDVEN 1.1 1.0  --   --   --   --   --   --   TSH  --   --   --  0.922  --   --   --   --   HGBA1C  --  6.0*  --   --   --   --   --   --   BNP  --   --   --  824.9* 826.8* 1,183.7* 972.6* 1,110.2*  MG  --  1.9  --   --  1.9 1.7 1.7 1.9  CALCIUM  --   --  8.2*  --  8.7* 8.1* 8.2* 8.4*      Lab Results  Component Value Date   HGBA1C 6.0 (H) 05/10/2023    Radiology Reports DG Chest Port 1 View  Result Date: 05/15/2023 CLINICAL DATA:  Shortness of breath EXAM: PORTABLE CHEST 1 VIEW COMPARISON:  Two days ago FINDINGS: Low volume chest. Chronic interstitial coarsening. There is no edema, consolidation, or pneumothorax. Trace pleural effusions confirmed on recent abdominal  CT. Cardiomegaly. There is been prior CABG. IMPRESSION: Cardiomegaly and trace pleural effusions as seen on recent abdominal CT. No new or progressive finding. Electronically Signed   By:  Tiburcio Pea M.D.   On: 05/15/2023 06:55   DG Chest Port 1 View  Result Date: 05/13/2023 CLINICAL DATA:  Shortness of breath. EXAM: PORTABLE CHEST 1 VIEW COMPARISON:  05/11/2023 FINDINGS: The cardio pericardial silhouette is enlarged. Interval slight increase in left base atelectasis with probable small left pleural effusion. Interstitial markings are diffusely coarsened with chronic features. Bones are diffusely demineralized. Telemetry leads overlie the chest. IMPRESSION: Interval slight increase in left base atelectasis with probable small left pleural effusion. Electronically Signed   By: Kennith Center M.D.   On: 05/13/2023 06:53      Signature  -   Susa Raring M.D on 05/16/2023 at 8:46 AM   -  To page go to [email or web address]

## 2023-05-16 NOTE — Plan of Care (Signed)
  Problem: Education: Goal: Ability to describe self-care measures that may prevent or decrease complications (Diabetes Survival Skills Education) will improve Outcome: Progressing Goal: Individualized Educational Video(s) Outcome: Progressing   

## 2023-05-17 ENCOUNTER — Other Ambulatory Visit (HOSPITAL_COMMUNITY): Payer: Self-pay

## 2023-05-17 DIAGNOSIS — K56609 Unspecified intestinal obstruction, unspecified as to partial versus complete obstruction: Secondary | ICD-10-CM | POA: Diagnosis not present

## 2023-05-17 LAB — BASIC METABOLIC PANEL
Anion gap: 8 (ref 5–15)
BUN: 16 mg/dL (ref 8–23)
CO2: 27 mmol/L (ref 22–32)
Calcium: 8.3 mg/dL — ABNORMAL LOW (ref 8.9–10.3)
Chloride: 100 mmol/L (ref 98–111)
Creatinine, Ser: 0.99 mg/dL (ref 0.61–1.24)
GFR, Estimated: >60 mL/min (ref >60)
Glucose, Bld: 155 mg/dL — ABNORMAL HIGH (ref 70–99)
Potassium: 4.2 mmol/L (ref 3.5–5.1)
Sodium: 135 mmol/L (ref 135–145)

## 2023-05-17 LAB — CBC WITH DIFFERENTIAL/PLATELET
Abs Immature Granulocytes: 0.03 10*3/uL (ref 0.00–0.07)
Basophils Absolute: 0 10*3/uL (ref 0.0–0.1)
Basophils Relative: 1 %
Eosinophils Absolute: 0.3 10*3/uL (ref 0.0–0.5)
Eosinophils Relative: 5 %
HCT: 29.5 % — ABNORMAL LOW (ref 39.0–52.0)
Hemoglobin: 9.4 g/dL — ABNORMAL LOW (ref 13.0–17.0)
Immature Granulocytes: 1 %
Lymphocytes Relative: 28 %
Lymphs Abs: 1.8 10*3/uL (ref 0.7–4.0)
MCH: 20.6 pg — ABNORMAL LOW (ref 26.0–34.0)
MCHC: 31.9 g/dL (ref 30.0–36.0)
MCV: 64.6 fL — ABNORMAL LOW (ref 80.0–100.0)
Monocytes Absolute: 0.7 10*3/uL (ref 0.1–1.0)
Monocytes Relative: 11 %
Neutro Abs: 3.5 10*3/uL (ref 1.7–7.7)
Neutrophils Relative %: 54 %
Platelets: 221 10*3/uL (ref 150–400)
RBC: 4.57 MIL/uL (ref 4.22–5.81)
RDW: 19.2 % — ABNORMAL HIGH (ref 11.5–15.5)
WBC: 6.3 10*3/uL (ref 4.0–10.5)
nRBC: 0 % (ref 0.0–0.2)

## 2023-05-17 LAB — TYPE AND SCREEN
ABO/RH(D): B POS
Antibody Screen: NEGATIVE
Unit division: 0

## 2023-05-17 LAB — GLUCOSE, CAPILLARY: Glucose-Capillary: 137 mg/dL — ABNORMAL HIGH (ref 70–99)

## 2023-05-17 LAB — BPAM RBC
Blood Product Expiration Date: 202408142359
ISSUE DATE / TIME: 202407211001
Unit Type and Rh: 7300

## 2023-05-17 LAB — MAGNESIUM: Magnesium: 1.8 mg/dL (ref 1.7–2.4)

## 2023-05-17 LAB — BRAIN NATRIURETIC PEPTIDE: B Natriuretic Peptide: 943.2 pg/mL — ABNORMAL HIGH (ref 0.0–100.0)

## 2023-05-17 MED ORDER — ACETAMINOPHEN 500 MG PO TABS
500.0000 mg | ORAL_TABLET | Freq: Three times a day (TID) | ORAL | 0 refills | Status: AC | PRN
  Filled 2023-05-17: qty 20, 7d supply, fill #0

## 2023-05-17 MED ORDER — POLYETHYLENE GLYCOL 3350 17 GM/SCOOP PO POWD
17.0000 g | Freq: Every day | ORAL | 0 refills | Status: DC | PRN
  Filled 2023-05-17: qty 238, 14d supply, fill #0

## 2023-05-17 MED ORDER — FERROUS SULFATE 325 (65 FE) MG PO TABS
325.0000 mg | ORAL_TABLET | Freq: Two times a day (BID) | ORAL | 0 refills | Status: DC
  Filled 2023-05-17: qty 100, 50d supply, fill #0

## 2023-05-17 MED ORDER — BISACODYL 10 MG RE SUPP
10.0000 mg | Freq: Every day | RECTAL | Status: DC
  Administered 2023-05-17: 10 mg via RECTAL
  Filled 2023-05-17: qty 1

## 2023-05-17 MED ORDER — BISACODYL 10 MG RE SUPP
10.0000 mg | Freq: Every day | RECTAL | 0 refills | Status: DC | PRN
  Filled 2023-05-17: qty 12, 12d supply, fill #0

## 2023-05-17 MED ORDER — FOLIC ACID 1 MG PO TABS
1.0000 mg | ORAL_TABLET | Freq: Every day | ORAL | 0 refills | Status: DC
  Filled 2023-05-17: qty 30, 30d supply, fill #0

## 2023-05-17 MED ORDER — LACTULOSE 10 GM/15ML PO SOLN
30.0000 g | Freq: Once | ORAL | Status: AC
  Administered 2023-05-17: 30 g via ORAL
  Filled 2023-05-17: qty 60

## 2023-05-17 MED ORDER — DOCUSATE SODIUM 100 MG PO CAPS
100.0000 mg | ORAL_CAPSULE | Freq: Two times a day (BID) | ORAL | 0 refills | Status: AC
  Filled 2023-05-17: qty 100, 50d supply, fill #0

## 2023-05-17 NOTE — TOC Transition Note (Signed)
Transition of Care [facility]) - CM/SW Discharge Note   Patient Details  Name: Dakota [name] MRN: [id number] Date of Birth: 1926/05/18  Transition of Care [facility]) CM/SW Contact:  Ronny Bacon, RN Phone Number: 05/17/2023, 9:03 AM   Clinical Narrative:   Patient is being discharged home today. Daughter at bedside will transport patient home. Daughter reports patient will be staying at [address], [place], [place] [address] until 06/22/2023 and then will be moving to a different location. Delila Spence aware of patient discharge home today and updated address information. Family will inform [facility] agency of new address that will begin 06/22/2023. Rolling walker at bedside.    Final next level of care: Home w Home Health Services Barriers to Discharge: No Barriers Identified   Patient Goals and CMS Choice CMS Medicare.gov Compare Post Acute Care list provided to:: Other (Comment Required) Choice offered to / list presented to : Adult Children  Discharge Placement                         Discharge Plan and Services Additional resources added to the After Visit Summary for     Discharge Planning Services: CM Consult Post Acute Care Choice: Durable Medical Equipment, Home Health          DME Arranged: Walker rolling DME Agency: [employer] Date DME Agency Contacted: 05/15/23 Time DME Agency Contacted: 1235 Representative spoke with at DME Agency: Leavy Cella HH Arranged: PT HH Agency: [facility] Date [facility] Agency Contacted: 05/15/23 Time HH Agency Contacted: 1237 Representative spoke with at [facility] Agency: Kandee Keen  Social Determinants of Health (SDOH) Interventions SDOH Screenings   Food Insecurity: No Food Insecurity (05/10/2023)  Housing: Low Risk  (05/10/2023)  Transportation Needs: No Transportation Needs (05/10/2023)  Utilities: Not At Risk (05/10/2023)  Tobacco Use: Low Risk  (05/12/2023)     Readmission Risk Interventions     No data to display

## 2023-05-17 NOTE — Progress Notes (Signed)
Discharge instructions reviewed with pt, daughters.  Copy of instructions given to pt. [facility] pharmacy filled scripts for pt and meds were delivered to pt/daughters in his room. Questions answered and message sent to CM to answer a questions about if pt can get an aide/sitter to stay with him when daughter has an doctor's appt etc. Will inform daughter of information obtained from the CM.  Pt's rolling walker has been delivered to the pt and it will go home with him.   Pt daughter states the niece is coming to pick them up after her 1100 MD appt. Pt will discharge when niece arrives.  Family assisting pt to get dressed and gathering his belongings.   Pt's primacy nurse made aware of the above and pt ready when niece arrives.   Dartagnan Beavers,RN SWOT

## 2023-05-17 NOTE — Discharge Summary (Signed)
Dakota [name]:[id number] DOB: 07-26-1926 DOA: 05/10/2023  PCP: [name], MD  Admit date: 05/10/2023  Discharge date: 05/17/2023  Admitted From: Home   Disposition:  Home   Recommendations for Outpatient Follow-up:   Follow up with PCP in 1-2 weeks  PCP Please obtain BMP/CBC, 2 view CXR in 1week,  (see Discharge instructions)   PCP Please follow up on the following pending results:    Home Health: PT   Equipment/Devices: as below  Consultations: [facility]CS Discharge Condition: Stable    CODE STATUS: Full    Diet Recommendation: Heart Healthy     Chief Complaint  Patient presents with   Hernia   Chest Pain     Brief history of present illness from the day of admission and additional interim summary    89 y.o. male with medical history significant of chronic right inguinal hernia, PAF on Eliquis, CAD status post CABG x 4, chronic HFrEF LVEF 30-50%, HTN, IIDM, presented with recurrent nonreducible right hernia and abdominal pain.                                                                  Hospital Course   SBO, obstructed right inguinal hernia -  seen by [facility], he has bilateral hernias with right more symptomatic than left, underwent bilateral inguinal hernia repair by Dr. Chevis Pretty on 05/12/2023, placed on regular diet by general surgery I have transitioned him to soft consistency for ease of he swallowing, advance activity, encouraged to sit in recliner in daytime,IS, PT OT. Seems to have tolerated the procedure well but ++ frail at baseline, weak cough reflex.    Overall feeling great and close to baseline will be discharged home today with outpatient follow-up with PCP and surgery, he is passing flatus but still no bowel movements, on good bowel regimen Dulcolax suppository added here at home.   Discussed with general surgery bedside stable for home discharge, plan also discussed with daughter at bedside.  Will be going home on good bowel regimen along with as needed rectal suppository.     Paroxysmal A-fib with RVR - seen by cardiology, low-dose oral beta-blocker, [place] vas 2 score of greater than 6, continue beta-blocker at home dose, resume Eliquis, appreciate cardiology input, stable TSH, echo noted.   Chronic systolic heart failure EF now improved to 45% from 40% 2 years ago- he is currently compensated seen by cardiology, continue home regimen and follow-up with PCP along with primary cardiology.  Has underlying moderate to severe AS as well.    Anemia of iron deficiency, microcytic and chronic.  Some acute drop due to heme dilution and frequent blood draws, 1 unit packed RBC transfusion on 05/16/2023, oral iron and folic acid supplementation as he does have generalized weakness and is weak and  frail, not a candidate for colonoscopy or extensive workup due to frail status and advanced age.  Discussed with daughter agrees with the plan.  Already on PPI continue.  Posttransfusion CBC stable.   CAD CABG  - No chest pains, troponin negative x 2, No acute concerns continue statin, Imdur, Eliquis and Home regimen.   Few drops of hematuria intermittently.  Likely due to some superficial injury from condom catheter.  No significant blood loss it was few drops at the end of urination, avoid condom catheter, hold Eliquis for a day, discussed with daughter, outpatient follow-up with PCP.     DM2  - Home Rx    Discharge diagnosis     Principal Problem:   SBO (small bowel obstruction) (HCC) Active Problems:   Chronic systolic CHF (congestive heart failure) (HCC)   CAD (coronary artery disease) of bypass graft   Inguinal hernia of right side with obstruction    Discharge instructions    Discharge Instructions     Discharge instructions   Complete by: As directed    Follow with  Primary MD Tisovec, Adelfa Koh, MD in 7 days, if you continue to have hematuria follow-up Primiry care physician.  Get CBC, CMP, magnesium, anemia panel,   2 view Chest X ray -  checked next visit with your primary MD    Activity: As tolerated with Full fall precautions use walker/cane & assistance as needed  Disposition Home    Diet: Heart Healthy   Special Instructions: If you have smoked or chewed Tobacco  in the last 2 yrs please stop smoking, stop any regular Alcohol  and or any Recreational drug use.  On your next visit with your primary care physician please Get Medicines reviewed and adjusted.  Please request your Prim.MD to go over all Hospital Tests and Procedure/Radiological results at the follow up, please get all Hospital records sent to your Prim MD by signing hospital release before you go home.  If you experience worsening of your admission symptoms, develop shortness of breath, life threatening emergency, suicidal or homicidal thoughts you must seek medical attention immediately by calling 911 or calling your MD immediately  if symptoms less severe.  You Must read complete instructions/literature along with all the possible adverse reactions/side effects for all the Medicines you take and that have been prescribed to you. Take any new Medicines after you have completely understood and accpet all the possible adverse reactions/side effects.   Do not drive when taking Pain medications.  Do not take more than prescribed Pain, Sleep and Anxiety Medications  Wear Seat belts while driving.   Increase activity slowly   Complete by: As directed        Discharge Medications   Allergies as of 05/17/2023       Reactions   Aceon [perindopril Erbumine] Shortness Of Breath, Cough   Sulfa Antibiotics Swelling   Pork-derived Products    muslim        Medication List     TAKE these medications    acetaminophen 500 MG tablet Commonly known as: TYLENOL Take 1 tablet (500  mg total) by mouth every 8 (eight) hours as needed for moderate pain.   albuterol 108 (90 Base) MCG/ACT inhaler Commonly known as: VENTOLIN HFA Inhale 2 puffs into the lungs every 4 (four) hours as needed for wheezing or shortness of breath.   apixaban 2.5 MG Tabs tablet Commonly known as: Eliquis Take 1 tablet (2.5 mg total) by mouth 2 (two) times daily.  bisacodyl 10 MG suppository Commonly known as: DULCOLAX Place 1 suppository (10 mg total) rectally daily as needed for moderate constipation.   docusate sodium 100 MG capsule Commonly known as: COLACE Take 1 capsule (100 mg total) by mouth 2 (two) times daily. What changed: when to take this   ferrous sulfate 325 (65 FE) MG tablet Take 1 tablet (325 mg total) by mouth 2 (two) times daily with a meal.   finasteride 5 MG tablet Commonly known as: PROSCAR Take 5 mg by mouth at bedtime.   folic acid 1 MG tablet Commonly known as: FOLVITE Take 1 tablet (1 mg total) by mouth daily.   furosemide 20 MG tablet Commonly known as: LASIX 20 MG EVERY 4 DAYS What changed:  how much to take how to take this when to take this   isosorbide mononitrate 30 MG 24 hr tablet Commonly known as: IMDUR Take 1 tablet by mouth once daily   ketorolac 0.5 % ophthalmic solution Commonly known as: ACULAR Place 1 drop into the left eye 4 (four) times daily.   losartan 25 MG tablet Commonly known as: COZAAR Take 1 tablet by mouth once daily   metFORMIN 500 MG tablet Commonly known as: GLUCOPHAGE Take 500 mg by mouth 2 (two) times daily.   mirabegron ER 50 MG Tb24 tablet Commonly known as: MYRBETRIQ Take 50 mg by mouth at bedtime.   multivitamin with minerals Tabs tablet Take 1 tablet by mouth daily.   Nitrostat 0.4 MG SL tablet Generic drug: nitroGLYCERIN DISSOLVE ONE TABLET UNDER THE TONGUE EVERY 5 MINUTES AS NEEDED FOR CHEST PAIN. What changed:  how much to take how to take this when to take this reasons to take  this additional instructions   ONE TOUCH ULTRA 2 w/Device Kit daily in the afternoon.   ONE TOUCH ULTRA TEST test strip Generic drug: glucose blood 1 each by Other route as needed (glucose monoring).   pantoprazole 40 MG tablet Commonly known as: PROTONIX Take 1 tablet (40 mg total) by mouth daily.   polyethylene glycol powder 17 GM/SCOOP powder Commonly known as: GLYCOLAX/MIRALAX Take 17 g (1 capful) by mouth daily as needed.   pravastatin 80 MG tablet Commonly known as: PRAVACHOL Take 80 mg by mouth at bedtime.   prednisoLONE acetate 1 % ophthalmic suspension Commonly known as: PRED FORTE Place 1 drop into the left eye 4 (four) times daily.   PRESERVISION AREDS 2 PO Take 1 tablet by mouth 2 (two) times daily.               Durable Medical Equipment  (From admission, onward)           Start     Ordered   05/15/23 0922  For home use only DME Walker rolling  Once       Comments: 5 wheel  Question Answer Comment  Walker: With 5 Inch Wheels   Patient needs a walker to treat with the following condition Weakness      05/15/23 0921             Follow-up Information     Chevis Pretty III, MD Follow up on 06/08/2023.   Specialty: General Surgery Why: 10am. Please bring a copy of your photo ID, insurance card and arrive 30 minutes prior to your appointment Contact information: [address] [place] [address] [phone]         [facility] Follow up.   Specialty: Home Health Services  Contact information: [address] [place] [address] [phone]         Tisovec, Adelfa Koh, MD. Schedule an appointment as soon as possible for a visit in 1 week(s).   Specialty: Internal Medicine Contact information: [address] [place] [address] [phone]                 Major procedures and Radiology Reports - PLEASE review detailed and final reports thoroughly  -     DG Chest [facility] 1  View  Result Date: 05/15/2023 CLINICAL DATA:  Shortness of breath EXAM: PORTABLE CHEST 1 VIEW COMPARISON:  Two days ago FINDINGS: Low volume chest. Chronic interstitial coarsening. There is no edema, consolidation, or pneumothorax. Trace pleural effusions confirmed on recent abdominal CT. Cardiomegaly. There is been prior CABG. IMPRESSION: Cardiomegaly and trace pleural effusions as seen on recent abdominal CT. No new or progressive finding. Electronically Signed   By: Tiburcio Pea M.D.   On: 05/15/2023 06:55   DG Chest Port 1 View  Result Date: 05/13/2023 CLINICAL DATA:  Shortness of breath. EXAM: PORTABLE CHEST 1 VIEW COMPARISON:  05/11/2023 FINDINGS: The cardio pericardial silhouette is enlarged. Interval slight increase in left base atelectasis with probable small left pleural effusion. Interstitial markings are diffusely coarsened with chronic features. Bones are diffusely demineralized. Telemetry leads overlie the chest. IMPRESSION: Interval slight increase in left base atelectasis with probable small left pleural effusion. Electronically Signed   By: Kennith Center M.D.   On: 05/13/2023 06:53   ECHOCARDIOGRAM COMPLETE  Result Date: 05/11/2023    ECHOCARDIOGRAM REPORT   Patient Name:   Dakota [name] Date of Exam: 05/11/2023 Medical Rec #:  [id number]    Height:       62.0 in Accession #:    [id number]   Weight:       108.2 lb Date of Birth:  25-Oct-1926     BSA:          1.473 m Patient Age:    89 years     BP:           117/78 mmHg Patient Gender: M            HR:           80 bpm. Exam Location:  Inpatient Procedure: 2D Echo, Color Doppler and Cardiac Doppler Indications:    CHF  History:        Patient has prior history of Echocardiogram examinations, most                 recent 04/14/2021. CAD, SBO and COPD; Risk Factors:Dyslipidemia                 and Diabetes.  Sonographer:    Milbert Coulter Referring Phys: [id number] Emeline General IMPRESSIONS  1. Technically difficult study.  2. Left ventricular  ejection fraction, by estimation, is 40 to 45%. The left ventricle has mildly decreased function. The left ventricle demonstrates global hypokinesis. There is mild concentric left ventricular hypertrophy. Left ventricular diastolic function could not be evaluated. Significant dyssynchrony due to LBBB.  3. Right ventricular systolic function is normal. The right ventricular size is normal.  4. There is severe calcifcation of the aortic valve. There is severe thickening of the aortic valve. Aortic valve regurgitation is not visualized. The aortic valve appears severly stenotic by visualization. There was limited/incomplete doppler evaluation for aortic stenosis with a mean gradient of .  5. The mitral valve is degenerative.  Mild mitral valve regurgitation. Moderate to severe mitral annular calcification. FINDINGS  Left Ventricle: Left ventricular ejection fraction, by estimation, is 40 to 45%. The left ventricle has mildly decreased function. The left ventricle demonstrates global hypokinesis. The left ventricular internal cavity size was normal in size. There is  mild concentric left ventricular hypertrophy. Abnormal (paradoxical) septal motion, consistent with left bundle branch block. Left ventricular diastolic function could not be evaluated due to nondiagnostic images. Left ventricular diastolic function could not be evaluated. Right Ventricle: The right ventricular size is normal. No increase in right ventricular wall thickness. Right ventricular systolic function is normal. Left Atrium: Left atrial size was normal in size. Right Atrium: Right atrial size was normal in size. Pericardium: There is no evidence of pericardial effusion. Mitral Valve: The mitral valve is degenerative in appearance. There is mild thickening of the mitral valve leaflet(s). There is mild calcification of the mitral valve leaflet(s). Moderate to severe mitral annular calcification. Mild mitral valve regurgitation. Tricuspid Valve:  The tricuspid valve is grossly normal. Tricuspid valve regurgitation is mild. Aortic Valve: The aortic valve is calcified. There is severe calcifcation of the aortic valve. There is severe thickening of the aortic valve. There is moderate aortic valve annular calcification. Aortic valve regurgitation is not visualized. Aortic valve mean gradient measures 5.0 mmHg. Aortic valve peak gradient measures 9.2 mmHg. Aortic valve area, by VTI measures 1.98 cm. Pulmonic Valve: The pulmonic valve was not well visualized. Pulmonic valve regurgitation is trivial. Aorta: The aortic root and ascending aorta are structurally normal, with no evidence of dilitation. IAS/Shunts: No atrial level shunt detected by color flow Doppler.  LEFT VENTRICLE PLAX 2D LVIDd:         4.00 cm   Diastology LVIDs:         3.00 cm   LV e' medial: 7.29 cm/s LV PW:         1.00 cm LV IVS:        1.00 cm LVOT diam:     2.10 cm LV SV:         41 LV SV Index:   28 LVOT Area:     3.46 cm  RIGHT VENTRICLE RV S prime:     6.53 cm/s TAPSE (M-mode): 1.3 cm LEFT ATRIUM             Index        RIGHT ATRIUM           Index LA Vol (A2C):   61.6 ml 41.83 ml/m  RA Area:     25.70 cm LA Vol (A4C):   65.5 ml 44.48 ml/m  RA Volume:   76.70 ml  52.08 ml/m LA Biplane Vol: 67.5 ml 45.84 ml/m  AORTIC VALVE AV Area (Vmax):    1.66 cm AV Area (Vmean):   1.67 cm AV Area (VTI):     1.98 cm AV Vmax:           152.00 cm/s AV Vmean:          97.700 cm/s AV VTI:            0.206 m AV Peak Grad:      9.2 mmHg AV Mean Grad:      5.0 mmHg LVOT Vmax:         72.80 cm/s LVOT Vmean:        47.100 cm/s LVOT VTI:          0.118 m LVOT/AV VTI ratio: 0.57  AORTA Ao  Root diam: 3.60 cm Ao Asc diam:  3.00 cm TRICUSPID VALVE TR Peak grad:   35.3 mmHg TR Vmax:        297.00 cm/s  SHUNTS Systemic VTI:  0.12 m Systemic Diam: 2.10 cm Aditya Sabharwal Electronically signed by [name] Signature Date/Time: 05/11/2023/6:14:34 PM    Final    DG Chest 1 View  Result Date:  05/11/2023 CLINICAL DATA:  Congestive heart failure. EXAM: CHEST  1 VIEW COMPARISON:  May 10, 2023. FINDINGS: Stable cardiomediastinal silhouette. Status post coronary bypass graft. Degenerative changes are seen involving both glenohumeral joints. Minimal left midlung and bibasilar opacities are noted concerning for subsegmental atelectasis or scarring. Small left pleural effusion is noted. IMPRESSION: Minimal left mid lung and bibasilar opacities are noted concerning for subsegmental atelectasis or possibly scarring. Small left pleural effusion is noted. Electronically Signed   By: Lupita Raider M.D.   On: 05/11/2023 08:12   CT ABDOMEN PELVIS W CONTRAST  Result Date: 05/10/2023 CLINICAL DATA:  Right inguinal hernia, epigastric pain EXAM: CT ABDOMEN AND PELVIS WITH CONTRAST TECHNIQUE: Multidetector CT imaging of the abdomen and pelvis was performed using the standard protocol following bolus administration of intravenous contrast. RADIATION DOSE REDUCTION: This exam was performed according to the departmental dose-optimization program which includes automated exposure control, adjustment of the mA and/or kV according to patient size and/or use of iterative reconstruction technique. CONTRAST:  75mL OMNIPAQUE IOHEXOL 350 MG/ML SOLN COMPARISON:  CT chest 07/22/2015 FINDINGS: Lower chest: Small pleural effusions, new since previous, with atelectasis posteriorly at the lung bases. Post median sternotomy. Coronary and aortic atheromatous calcifications. Mitral annulus calcifications. Hepatobiliary: No focal liver abnormality is seen. No gallstones, gallbladder wall thickening, or biliary dilatation. Pancreas: Unremarkable. No pancreatic ductal dilatation or surrounding inflammatory changes. Spleen: Normal in size without focal abnormality. Adrenals/Urinary Tract: She no adrenal mass. Symmetric renal parenchymal enhancement. Multiple cortical lesions in both kidneys, some of which can be characterized as cysts,  largest 2.1 cm 5 HU right upper pole; no followup recommended. No evident urolithiasis or hydronephrosis. Urinary bladder physiologically distended. Stomach/Bowel: Stomach is decompressed. Duodenum is nondistended, unremarkable. Proximal small bowel is decompressed. There are multiple dilated mid small bowel loops. A loop of small bowel is involved in right inguinal hernia, and this appears to be a transition point to decompressed distal small bowel. The colon is incompletely distended, with a few scattered diverticula distally. Vascular/Lymphatic: Aortoiliac calcified atheromatous plaque. Portal vein patent. No abdominal or pelvic adenopathy. Reproductive: Prostate enlargement with central coarse calcifications. Other: Small volume scattered abdominal and pelvic ascites. Fluid extends into bilateral inguinal hernias. Musculoskeletal: Lumbar levoscoliosis with multilevel spondylitic change. Heterogenous mineralization of the bones. No fracture or or discrete lesion. Sternotomy wires. IMPRESSION: 1. Small-bowel obstruction secondary to right inguinal hernia. 2. Small volume ascites. 3. Small pleural effusions. 4.  Aortic Atherosclerosis (ICD10-I70.0). Electronically Signed   By: Corlis Leak M.D.   On: 05/10/2023 11:29   DG Chest Portable 1 View  Result Date: 05/10/2023 CLINICAL DATA:  Cough and chest pain. EXAM: PORTABLE CHEST 1 VIEW COMPARISON:  April 09, 2018 FINDINGS: Enlarged cardiac silhouette. Stable postsurgical changes from CABG. Chronic elevation of the left hemidiaphragm. Hazy airspace opacities throughout both lungs may represent hypoventilatory changes,, mixed pattern pulmonary edema or atypical/viral pneumonia. IMPRESSION: 1. Hazy airspace opacities throughout both lungs may represent hypoventilatory changes, mixed pattern pulmonary edema or atypical/viral pneumonia. 2. Enlarged cardiac silhouette. Electronically Signed   By: Ted Mcalpine M.D.   On: 05/10/2023 10:57  Today   Subjective     Meet [place] today has no headache,no chest abdominal pain,no new weakness tingling or numbness, feels much better wants to go home today.    Objective   Blood pressure 128/71, pulse 82, temperature 99 F (37.2 C), temperature source Oral, resp. rate 15, weight 49.1 kg, SpO2 96%.   Intake/Output Summary (Last 24 hours) at 05/17/2023 0843 Last data filed at 05/17/2023 0735 Gross per 24 hour  Intake 2663.84 ml  Output 500 ml  Net 2163.84 ml    Exam  Awake Alert, No new F.N deficits,    [place].AT,PERRAL Supple Neck,   Symmetrical Chest wall movement, Good air movement bilaterally, CTAB RRR,No Gallops,   +ve B.Sounds, Abd Soft, Non tender,  No Cyanosis, Clubbing or edema    Data Review   Recent Labs  Lab 05/13/23 0331 05/14/23 0735 05/15/23 0234 05/16/23 0430 05/17/23 0101  WBC 8.7 7.8 7.5 6.4 6.3  HGB 8.7* 8.7* 8.5* 8.0* 9.4*  HCT 27.3* 28.1* 26.7* 25.5* 29.5*  PLT 209 198 215 223 221  MCV 61.6* 63.3* 63.0* 61.9* 64.6*  MCH 19.6* 19.6* 20.0* 19.4* 20.6*  MCHC 31.9 31.0 31.8 31.4 31.9  RDW 15.7* 15.7* 16.0* 15.9* 19.2*  LYMPHSABS 1.4 1.9 2.3 1.7 1.8  MONOABS 0.8 0.7 0.9 0.7 0.7  EOSABS 0.0 0.1 0.2 0.2 0.3  BASOSABS 0.0 0.0 0.0 0.0 0.0    Recent Labs  Lab  0000 05/10/23 0918 05/10/23 1100 05/10/23 1239 05/10/23 1634 05/11/23 0156 05/11/23 0655 05/12/23 0549 05/13/23 0331 05/14/23 0735 05/15/23 0234 05/17/23 0101  NA  --  138  --   --   --    < >  --  138 134* 134* 138 135  K  --  4.6  --   --   --    < >  --  3.6 4.3 3.8 3.7 4.2  CL  --  98  --   --   --    < >  --  104 105 100 106 100  CO2  --  21*  --   --   --    < >  --  26 22 24 26 27   ANIONGAP  --  19*  --   --   --    < >  --  8 7 10 6 8   GLUCOSE  --  194*  --   --   --    < >  --  126* 137* 121* 116* 155*  BUN  --  23  --   --   --    < >  --  12 11 13 13 16   CREATININE  --  0.81  --   --   --    < >  --  0.71 0.76 0.67 0.69 0.99  AST  --  30  --   --   --   --   --   --   --   --   --   --    ALT  --  14  --   --   --   --   --   --   --   --   --   --   ALKPHOS  --  38  --   --   --   --   --   --   --   --   --   --   BILITOT  --  1.2  --   --   --   --   --   --   --   --   --   --  ALBUMIN  --  3.9  --   --   --   --   --   --   --   --   --   --   CRP  --   --   --   --   --   --  0.6 <0.5 <0.5 0.8 0.9  --   PROCALCITON  --   --   --   --   --   --  <0.10 <0.10 <0.10 <0.10 <0.10  --   LATICACIDVEN  --   --   --  1.1 1.0  --   --   --   --   --   --   --   TSH  --   --   --   --   --   --  0.922  --   --   --   --   --   HGBA1C  --   --   --   --  6.0*  --   --   --   --   --   --   --   BNP  --   --    < >  --   --   --  824.9* 826.8* 1,183.7* 972.6* 1,110.2* 943.2*  MG   < >  --   --   --  1.9  --   --  1.9 1.7 1.7 1.9 1.8  CALCIUM  --  9.1  --   --   --    < >  --  8.7* 8.1* 8.2* 8.4* 8.3*   < > = values in this interval not displayed.    Total Time in preparing paper work, data evaluation and todays exam - 35 minutes  Signature  -    Susa Raring M.D on 05/17/2023 at 8:43 AM   -  To page go to [email or web address]

## 2023-05-17 NOTE — Plan of Care (Signed)

## 2023-05-17 NOTE — Progress Notes (Signed)
Ride is here and pt discharged via wheelchair with belongings, escorted by hospital volunteer.

## 2023-05-17 NOTE — Progress Notes (Signed)
5 Days Post-Op  Subjective: CC: TRH planning d/c today. No abdominal pain. Tolerating diet without n/v. No PRN pain or anti-nausea medications yesterday. Passing flatus. No bm yesterday. Last BM POD1. TRH has ordered suppository for today.   Afebrile. No tachycardia or hypotension. WBC wnl. S/p 1U PRBC yesterday with hgb 8.0 > 9.4 (some low bp's that TRH is holding ARB for).  Working with PT. No f/u recommended.   Objective: Vital signs in last 24 hours: Temp:  [97 F (36.1 C)-99 F (37.2 C)] 99 F (37.2 C) (07/21 1900) Pulse Rate:  [78-87] 82 (07/22 0400) Resp:  [15-18] 15 (07/21 1900) BP: (108-128)/(68-90) 128/71 (07/22 0400) SpO2:  [96 %-99 %] 96 % (07/22 0400) Last BM Date : 05/11/23 (per pt family he has not had a BM since surgery)  Intake/Output from previous day: 07/21 0701 - 07/22 0700 In: 1375.8 [I.V.:103.8; Blood:1272] Out: 500 [Urine:500] Intake/Output this shift: Total I/O In: 1288 [Blood:1288] Out: -   PE: Gen:  Alert, NAD, pleasant Abd: Soft, ND, NT, +BS, b/l inguinal incisions cdi  Lab Results:  Recent Labs    05/16/23 0430 05/17/23 0101  WBC 6.4 6.3  HGB 8.0* 9.4*  HCT 25.5* 29.5*  PLT 223 221   BMET Recent Labs    05/15/23 0234 05/17/23 0101  NA 138 135  K 3.7 4.2  CL 106 100  CO2 26 27  GLUCOSE 116* 155*  BUN 13 16  CREATININE 0.69 0.99  CALCIUM 8.4* 8.3*   PT/INR No results for input(s): "LABPROT", "INR" in the last 72 hours. CMP     Component Value Date/Time   NA 135 05/17/2023 0101   NA 137 03/05/2023 1412   K 4.2 05/17/2023 0101   CL 100 05/17/2023 0101   CO2 27 05/17/2023 0101   GLUCOSE 155 (H) 05/17/2023 0101   BUN 16 05/17/2023 0101   BUN 28 03/05/2023 1412   CREATININE 0.99 05/17/2023 0101   CREATININE 0.73 07/20/2017 0949   CALCIUM 8.3 (L) 05/17/2023 0101   PROT 6.7 05/10/2023 0918   PROT 6.8 02/13/2022 1236   ALBUMIN 3.9 05/10/2023 0918   ALBUMIN 4.2 02/13/2022 1236   AST 30 05/10/2023 0918   ALT 14  05/10/2023 0918   ALKPHOS 38 05/10/2023 0918   BILITOT 1.2 05/10/2023 0918   BILITOT 0.9 02/13/2022 1236   GFRNONAA >60 05/17/2023 0101   GFRNONAA 82 07/20/2017 0949   GFRAA 87 11/29/2020 1601   GFRAA 95 07/20/2017 0949   Lipase     Component Value Date/Time   LIPASE 28 05/10/2023 0918    Studies/Results: No results found.  Anti-infectives: Anti-infectives (From admission, onward)    Start     Dose/Rate Route Frequency Ordered Stop   05/10/23 2200  amoxicillin (AMOXIL) capsule 500 mg        500 mg Oral Every 12 hours 05/10/23 1925 05/12/23 0913        Assessment/Plan POD 5 s/p bilateral inguinal hernia repairs with mesh by Dr. Carolynne Edouard on 7/17 - Tolerating soft diet without n/v. BM POD1. Passing flatus. TRH planning suppository today. If has BM, they plan d/c today.  - Incisions cdi - Pulm toilet - Mobilize, PT - Agree with plan above for possible d/c today. Discussed discharge instructions, restrictions and return/call back precautions with family. Follow up arranged.   FEN - Soft diet. IVF per TRH VTE - SCDs, okay to restart DOAC ID - Peri-op abx. None currently. Afebrile. WBC wnl.  Foley - None,  voiding   LOS: 7 days    Jacinto Halim , [facility] 05/17/2023, 8:00 AM Please see Amion for pager number during day hours 7:00am-4:30pm

## 2023-05-17 NOTE — Progress Notes (Signed)
Physical Therapy Treatment Patient Details Name: Dakota [name] MRN: [id number] DOB: April 20, 1926 Today's Date: 05/17/2023   History of Present Illness 89 y.o. male presented 05/10/23 with recurrent nonreducible right hernia and abdominal pain. Now s/p bil inguinal hernia repairs 7/17. PMH significant of chronic right inguinal hernia, PAF on Eliquis, CAD status post CABG x 4, chronic HFrEF LVEF 30-50%, HTN, IIDM,    PT Comments  Pt received in chair, agreeable to therapy session and with good participation and tolerance for transfer, gait and stair training with RW/ BUE support. Pt needing up to min guard for transfers with dense cues for safe technique and increased time for problem solving. Pt needing increased time >5 mins in bathroom when checked on after session so family/pt notified to pull call bell on wall when he is done toileting to notify staff for assist when done with BM, family agreeable. DME updated per discussion with pt/supervising PT Jerolyn Center, case mgr notified.     Assistance Recommended at Discharge Frequent or constant Supervision/Assistance  If plan is discharge home, recommend the following:  Can travel by private vehicle    A little help with walking and/or transfers;Assistance with cooking/housework;Assist for transportation;Help with stairs or ramp for entrance      Equipment Recommendations  BSC/3in1    Recommendations for Other Services       Precautions / Restrictions Precautions Precautions: Fall Restrictions Weight Bearing Restrictions: No     Mobility  Bed Mobility Overal bed mobility: Needs Assistance             General bed mobility comments: Pt received up in chair.    Transfers Overall transfer level: Needs assistance Equipment used: Rolling walker (2 wheels) Transfers: Sit to/from Stand, Bed to chair/wheelchair/BSC Sit to Stand: Min guard           General transfer comment: From chair height, pt needed initial dense cues for better  technique (scooting forward, pushing from arm rests) and cues to reach back to [facility] prior to sitting down.    Ambulation/Gait Ambulation/Gait assistance: Min guard, Supervision Gait Distance (Feet): 25 Feet Assistive device: Rolling walker (2 wheels) Gait Pattern/deviations: Step-to pattern, Trunk flexed, Decreased step length - right, Decreased step length - left (forward head/downward gaze)       General Gait Details: Cues for proximity to RW, upright posture; very small, low steps. Pt states "this is difficult" so PTA showed his daughter how to remove rubber RW caps to replace with smoother plastic caps to reduce friction. Reviewed this can be changed back at any time as he needs for safety, daughter receptive.   Stairs Stairs: Yes Stairs assistance: Min assist Stair Management: One rail Right, Step to pattern, Sideways Number of Stairs: 1 General stair comments: pt able to step up onto/off of single step sideways (to simulate home set up) with bil UE support on countertop, light assist to steady with stepping down/off step. Pt family given gait belt for home.   Wheelchair Mobility     Tilt Bed    Modified Rankin (Stroke Patients Only)       Balance Overall balance assessment: Needs assistance Sitting-balance support: Single extremity supported, Bilateral upper extremity supported, No upper extremity supported, Feet supported Sitting balance-Leahy Scale: Fair     Standing balance support: Single extremity supported, No upper extremity supported, Reliant on assistive device for balance Standing balance-Leahy Scale: Fair Standing balance comment: fair static standing unsupported, poor dynamic standing unsupported  Cognition Arousal/Alertness: Awake/alert Behavior During Therapy: WFL for tasks assessed/performed Overall Cognitive Status: Within Functional Limits for tasks assessed                                  General Comments: Pt appears HoH and with slow processing (took ~5 times asking his name and family had to repeat therapist question before pt provided his name for PTA), otherwise overall WFL this session with pt requiring occasional cues for sequencing and safety during functional tasks. Pt pleasant throughout session, daughter present and standing by receptive to instruction on guarding/safety as well.        Exercises      General Comments General comments (skin integrity, edema, etc.): no acute s/sx distress, VSS per chart review      Pertinent Vitals/Pain Pain Assessment Pain Assessment: Faces Faces Pain Scale: Hurts a little bit Pain Location: surgical site Pain Descriptors / Indicators: Operative site guarding Pain Intervention(s): Limited activity within patient's tolerance, Monitored during session, Repositioned    Home Living                          Prior Function            PT Goals (current goals can now be found in the care plan section) Acute Rehab PT Goals Patient Stated Goal: return home PT Goal Formulation: With patient/family Time For Goal Achievement: 05/27/23 Progress towards PT goals: Progressing toward goals    Frequency    Min 3X/week      PT Plan Equipment recommendations need to be updated    Co-evaluation              AM-PAC PT "6 Clicks" Mobility   Outcome Measure  Help needed turning from your back to your side while in a flat bed without using bedrails?: None Help needed moving from lying on your back to sitting on the side of a flat bed without using bedrails?: A Little Help needed moving to and from a bed to a chair (including a wheelchair)?: A Little Help needed standing up from a chair using your arms (e.g., wheelchair or bedside chair)?: A Little Help needed to walk in hospital room?: A Little Help needed climbing 3-5 steps with a railing? : A Little 6 Click Score: 19    End of Session Equipment Utilized  During Treatment: Gait belt Activity Tolerance: Patient tolerated treatment well;Other (comment) (urgency to use toilet limiting gait tolerance) Patient left: with call bell/phone within reach;with family/visitor present;Other (comment) (on toilet, daughter in room and aware to pull call bell when pt is done) Nurse Communication: Mobility status;Other (comment) (family DME request) PT Visit Diagnosis: Difficulty in walking, not elsewhere classified (R26.2)     Time: 1610-9604 PT Time Calculation (min) (ACUTE ONLY): 23 min  Charges:    $Gait Training: 8-22 mins $Therapeutic Activity: 8-22 mins PT General Charges $$ ACUTE PT VISIT: 1 Visit                     Quade Ramirez P., PTA Acute Rehabilitation Services Secure Chat Preferred 9a-5:30pm Office: [phone]    Dorathy Kinsman [facility] 05/17/2023, 11:59 AM

## 2023-05-19 ENCOUNTER — Encounter (INDEPENDENT_AMBULATORY_CARE_PROVIDER_SITE_OTHER): Payer: Medicare HMO | Admitting: Ophthalmology

## 2023-05-20 DIAGNOSIS — I480 Paroxysmal atrial fibrillation: Secondary | ICD-10-CM | POA: Diagnosis not present

## 2023-05-20 DIAGNOSIS — I2510 Atherosclerotic heart disease of native coronary artery without angina pectoris: Secondary | ICD-10-CM | POA: Diagnosis not present

## 2023-05-20 DIAGNOSIS — D509 Iron deficiency anemia, unspecified: Secondary | ICD-10-CM | POA: Diagnosis not present

## 2023-05-20 DIAGNOSIS — I5022 Chronic systolic (congestive) heart failure: Secondary | ICD-10-CM | POA: Diagnosis not present

## 2023-05-20 DIAGNOSIS — E119 Type 2 diabetes mellitus without complications: Secondary | ICD-10-CM | POA: Diagnosis not present

## 2023-05-20 DIAGNOSIS — Z9181 History of falling: Secondary | ICD-10-CM | POA: Diagnosis not present

## 2023-05-20 DIAGNOSIS — J9811 Atelectasis: Secondary | ICD-10-CM | POA: Diagnosis not present

## 2023-05-20 DIAGNOSIS — I110 Hypertensive heart disease with heart failure: Secondary | ICD-10-CM | POA: Diagnosis not present

## 2023-05-20 DIAGNOSIS — I447 Left bundle-branch block, unspecified: Secondary | ICD-10-CM | POA: Diagnosis not present

## 2023-05-20 DIAGNOSIS — Z48815 Encounter for surgical aftercare following surgery on the digestive system: Secondary | ICD-10-CM | POA: Diagnosis not present

## 2023-05-20 DIAGNOSIS — Z7901 Long term (current) use of anticoagulants: Secondary | ICD-10-CM | POA: Diagnosis not present

## 2023-05-24 DIAGNOSIS — I119 Hypertensive heart disease without heart failure: Secondary | ICD-10-CM | POA: Diagnosis not present

## 2023-05-24 DIAGNOSIS — E1151 Type 2 diabetes mellitus with diabetic peripheral angiopathy without gangrene: Secondary | ICD-10-CM | POA: Diagnosis not present

## 2023-05-25 ENCOUNTER — Other Ambulatory Visit: Payer: Self-pay | Admitting: Cardiovascular Disease

## 2023-05-25 DIAGNOSIS — I4811 Longstanding persistent atrial fibrillation: Secondary | ICD-10-CM

## 2023-05-25 NOTE — Telephone Encounter (Signed)
Eliquis 2.5mg  refill request received. Patient is 89 years old, weight-49.1kg, Crea-0.99 on 05/17/23, Diagnosis-Afib, and last seen by Edd Fabian on 03/05/23. Dose is appropriate based on dosing criteria. Will send in refill to requested pharmacy.

## 2023-05-26 DIAGNOSIS — I480 Paroxysmal atrial fibrillation: Secondary | ICD-10-CM | POA: Diagnosis not present

## 2023-05-26 DIAGNOSIS — Z48815 Encounter for surgical aftercare following surgery on the digestive system: Secondary | ICD-10-CM | POA: Diagnosis not present

## 2023-05-26 DIAGNOSIS — I447 Left bundle-branch block, unspecified: Secondary | ICD-10-CM | POA: Diagnosis not present

## 2023-05-26 DIAGNOSIS — E119 Type 2 diabetes mellitus without complications: Secondary | ICD-10-CM | POA: Diagnosis not present

## 2023-05-26 DIAGNOSIS — I110 Hypertensive heart disease with heart failure: Secondary | ICD-10-CM | POA: Diagnosis not present

## 2023-05-26 DIAGNOSIS — D509 Iron deficiency anemia, unspecified: Secondary | ICD-10-CM | POA: Diagnosis not present

## 2023-05-26 DIAGNOSIS — J9811 Atelectasis: Secondary | ICD-10-CM | POA: Diagnosis not present

## 2023-05-26 DIAGNOSIS — I5022 Chronic systolic (congestive) heart failure: Secondary | ICD-10-CM | POA: Diagnosis not present

## 2023-05-26 DIAGNOSIS — I2510 Atherosclerotic heart disease of native coronary artery without angina pectoris: Secondary | ICD-10-CM | POA: Diagnosis not present

## 2023-06-01 DIAGNOSIS — I5022 Chronic systolic (congestive) heart failure: Secondary | ICD-10-CM | POA: Diagnosis not present

## 2023-06-01 DIAGNOSIS — Z48815 Encounter for surgical aftercare following surgery on the digestive system: Secondary | ICD-10-CM | POA: Diagnosis not present

## 2023-06-01 DIAGNOSIS — D509 Iron deficiency anemia, unspecified: Secondary | ICD-10-CM | POA: Diagnosis not present

## 2023-06-01 DIAGNOSIS — I447 Left bundle-branch block, unspecified: Secondary | ICD-10-CM | POA: Diagnosis not present

## 2023-06-01 DIAGNOSIS — J9811 Atelectasis: Secondary | ICD-10-CM | POA: Diagnosis not present

## 2023-06-01 DIAGNOSIS — E119 Type 2 diabetes mellitus without complications: Secondary | ICD-10-CM | POA: Diagnosis not present

## 2023-06-01 DIAGNOSIS — I110 Hypertensive heart disease with heart failure: Secondary | ICD-10-CM | POA: Diagnosis not present

## 2023-06-01 DIAGNOSIS — I2510 Atherosclerotic heart disease of native coronary artery without angina pectoris: Secondary | ICD-10-CM | POA: Diagnosis not present

## 2023-06-01 DIAGNOSIS — I480 Paroxysmal atrial fibrillation: Secondary | ICD-10-CM | POA: Diagnosis not present

## 2023-06-03 ENCOUNTER — Ambulatory Visit: Payer: Medicare HMO | Admitting: General Practice

## 2023-06-05 ENCOUNTER — Other Ambulatory Visit: Payer: Self-pay | Admitting: Cardiovascular Disease

## 2023-06-26 ENCOUNTER — Other Ambulatory Visit: Payer: Self-pay | Admitting: Cardiovascular Disease

## 2023-06-26 DIAGNOSIS — I5043 Acute on chronic combined systolic (congestive) and diastolic (congestive) heart failure: Secondary | ICD-10-CM

## 2023-06-29 ENCOUNTER — Encounter (INDEPENDENT_AMBULATORY_CARE_PROVIDER_SITE_OTHER): Payer: Medicare HMO | Admitting: Ophthalmology

## 2023-06-30 NOTE — Progress Notes (Unsigned)
Cardiology Clinic Note   Patient Name: Dakota [name] Date of Encounter: 07/05/2023  Primary Care Provider:  [name], MD Primary Cardiologist:  Nicki Guadalajara, MD  Patient Profile    Dakota [name] 89 year old male presents the clinic today for follow-up evaluation of his combined systolic and diastolic heart failure.   Past Medical History    Past Medical History:  Diagnosis Date   Aortic atherosclerosis (HCC)    Arthritis    Benign localized prostatic hyperplasia with lower urinary tract symptoms (LUTS)    Chronic chest pain    takes imdur   Chronic combined systolic (congestive) and diastolic (congestive) heart failure (HCC)    followed by cardiology   Chronic dyspnea    Cognitive impairment    COPD, moderate (HCC)    Edema of both lower extremities    takes lasix and uses teds   First degree heart block    GERD (gastroesophageal reflux disease)    Heart murmur    History of cardiovascular stress test    Lexiscan Myoview 7/16:  EF 51%, inferior, inferolateral ischemia; Intermediate Risk   History of smallpox    HOH (hard of hearing)    even with hearing aids   Hypertension    Ischemic heart disease 2005   Dr Tresa Endo a. s/p CABG;  b.  LHC 05/23/15:  S-RPAVB ok, S-D2 ok, S-OM2 99% (s/p Synergy DES), L-LAD ok, EF normal (complicated by pseudoaneurysm)    LBBB (left bundle branch block)    Right arm weakness    S/P CABG x 4    01-08-2004   S/P drug eluting coronary stent placement    05-21-2015  PCI and DES x1 to SVG--OM graft   Thalassemia minor    Type 2 diabetes, diet controlled (HCC)    Wears glasses    Wears hearing aid in both ears    Past Surgical History:  Procedure Laterality Date   APPENDECTOMY     CARDIAC CATHETERIZATION  1989   in [place]   normal     CARDIAC CATHETERIZATION N/A 05/23/2015   Procedure: Left Heart Cath and Cors/Grafts Angiography;  Surgeon: Lennette Bihari, MD;  Location: [facility];  Service: Cardiovascular;   Laterality: N/A;   CARDIAC CATHETERIZATION N/A 05/23/2015   Procedure: Coronary Stent Intervention;  Surgeon: Lennette Bihari, MD;  Location: [facility];  Service: Cardiovascular;  Laterality: N/A;   CARDIAC CATHETERIZATION  01-04-2004   dr Melburn Popper  @[facility]    severe 3V CAD, normal LVF   CATARACT EXTRACTION W/ INTRAOCULAR LENS  IMPLANT, BILATERAL     CORONARY ARTERY BYPASS GRAFT  01-08-2004  dr hendrickson @[facility]    LIMA to LAD,  SVG to D1,  SVG to OM,  SVG to PDA   FOOT SURGERY Right    INGUINAL HERNIA REPAIR Bilateral 1978   INGUINAL HERNIA REPAIR Bilateral 05/12/2023   Procedure: HERNIA REPAIR INGUINAL ADULT;  Surgeon: Griselda Miner, MD;  Location: [facility] OR;  Service: General;  Laterality: Bilateral;   INSERTION OF MESH Bilateral 05/12/2023   Procedure: INSERTION OF MESH;  Surgeon: Griselda Miner, MD;  Location: [facility] OR;  Service: General;  Laterality: Bilateral;   MASS EXCISION Left 11/20/2019   Procedure: EXCISION OF SEBACEOUS CYST CHEST;  Surgeon: Sheliah Hatch De Blanch, MD;  Location: [facility];  Service: General;  Laterality: Left;    Allergies  Allergies  Allergen Reactions   Aceon [Perindopril Erbumine] Shortness Of Breath and  Cough   Sulfa Antibiotics Swelling   Pork-Derived Products     muslim    History of Present Illness    [name] has a PMH ofleft bundle branch block, combined systolic and diastolic heart failure, CAD, essential hypertension, moderate COPD, diabetes, and nonspecific chest pain.   He was  seen by Dr. Tresa Endo on 07/17/2019.  During that time he felt well and was considering return to work as well or greater in January 2021.  However, due to the COVID-19 pandemic it was recommended that he not return to work at this time.  An echocardiogram on 07/19/2019 to evaluate systolic function showed an LVEF of 50 to 55%, a mildly dilated left atrium, moderately calcified mitral valve, severe aortic valve annular calcification, mitral valve regurgitation, mild  tricuspid valve regurgitation, and mildly elevated pulmonary artery systolic pressure.     He underwent CABG in 2005 with a LIMA to LAD, SVG to diagonal, SVG to obtuse marginal and SVG to distal RCA.  He has a history of combined systolic and diastolic heart failure, left bundle branch block and diabetes mellitus.  In July 2016 he developed symptoms worrisome for unstable angina.  A nuclear stress test was abnormal and demonstrated inferior to inferolateral ischemia.  He underwent cardiac catheterization on 05/21/2015.  His catheterization showed low normal global LV function with an EF of 50 to 55%.  The catheterization also demonstrated coronary artery disease with 95% native distal left main stenosis with proximal occlusion of the LAD after the first 2 septal perforating arteries total occlusion of the circumflex at the ostium and RCA stenosis with 50% proximal, 50% mid and total occlusion of the distal RCA.  His LIMA graft was patent to his mid LAD.  This vein graft supplying the circumflex marginal and 99% distal stenosis.  He also had a patent vein graft supplying the diagonal vessel and a patent vein graft to the distal RCA.  A DES x1 to the obtuse marginal vein graft was placed at that time.   He presented the clinic 08/07/2019 and stated he felt well.  He continued to be active outside walking daily.  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  However, his daughter did not think this was a good idea.  Given his comorbidities and cardiac history with increased risk of COVID-19 he was advised not to return to work.   I will gave him a work note.  He had no other concerns or complaints at the time.   He was seen by Dr. [place] 11/21/2020.  During that time he reported increased dyspnea and lower extremity swelling for several weeks.  He had a cough that produced yellow phlegm.  He had taken extra furosemide, was urinating more however, still had lower extremity swelling.  He was seen  by his primary care who ordered a chest x-ray which showed bilateral pleural effusion.  He was started on Levaquin for possible PNA.  Lab work was also drawn at the time but was still pending.  His furosemide was increased to 40 mg twice daily.  He was instructed that if he became worse he may be a candidate for thoracentesis.   He presented to the clinic 11/29/2020 for follow-up evaluation stated he was feeling much better.  He questioned whether he needed to continue to take 40 mg of Lasix twice daily.  He reported that he noticed that his ankles still had some swelling.  He felt that the fluid  was coming off fairly slowly.  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   I ordered a BMP and plan follow-up in 6 months.  He was seen in follow-up by Dr. Servando Salina on 11/25/2022.  His daughter had contacted the office the prior day and reported that he had lower extremity swelling.  He was noted to have some left ankle edema.  He denied shortness of breath, chest pain lightheadedness, dizziness, and leg pain.  His Lasix continued every third day but he was asked to take an extra dose.  Continued use of the lower extremity support stockings was recommended.  His BMP was stable and his magnesium was WDL.    He presented to the clinic 03/05/23 for follow-up evaluation and stated he was retired.  He presented with his daughter.  He reported that he walked most days of the week.  He had been taking his furosemide every third day.  His blood pressure was low today at 86/52.  He denied chest pain shortness of breath.  He was euvolemic.  I stopped his amlodipine, decreased his furosemide to every fourth day, had him slightly increase his p.o. hydration, ordered a BMP and planned follow-up when they returned to town after 06/24/2023.  He was admitted to the hospital on 05/10/2023 and discharged on 05/17/2023.  Cardiology was consulted for surgical reduction of right inguinal hernia.  He underwent TTE 05/11/2023 which showed an EF of  40-45%, global hypokinesis, mild concentric LVH, and dyssynchrony due to LBBB.  He was noted to have severe calcification of his aortic valve with severe thickening of the aortic valve.  He was noted to have aortic stenosis with a mean gradient of 5 mmHg.  Mild mitral valve regurgitation was also noted.  He underwent inguinal hernia repair 7/24.  He presents to the clinic today for follow-up evaluation and states he continues to walk regularly.  He presents with his daughter.  She reports that he is eating well.  She notes that he has been sleeping more.  His weight has remained stable.  His blood pressure today is 126/66.  He reports that he is generally doing well.  We reviewed his recent surgery.  He reports that he needs samples of apixaban 2.5 mg twice daily.  I will provide these if we have them.  He has follow-up appointment planned with Dr. Tresa Endo in 1 month.  We will continue current medication regimen, daily weights, and low-sodium diet..    Today he denies chest pain, increased shortness of breath, lower extremity edema, fatigue, palpitations, melena, hematuria, hemoptysis, diaphoresis, weakness, presyncope, syncope, orthopnea, and PND.      Home Medications    Prior to Admission medications   Medication Sig Start Date End Date Taking? Authorizing Provider  albuterol (PROVENTIL HFA;VENTOLIN HFA) 108 (90 Base) MCG/ACT inhaler Inhale 2 puffs into the lungs every 4 (four) hours as needed for wheezing or shortness of breath. 01/03/16   Charm Rings, MD  amLODipine (NORVASC) 2.5 MG tablet Take 1 tablet by mouth once daily 06/08/22   Lennette Bihari, MD  apixaban (ELIQUIS) 2.5 MG TABS tablet Take 1 tablet (2.5 mg total) by mouth 2 (two) times daily. 11/04/22   Lennette Bihari, MD  Blood Glucose Monitoring Suppl (ONE TOUCH ULTRA 2) w/Device KIT daily in the afternoon. 11/02/22   [provider]  chlorhexidine (PERIDEX) 0.12 % solution 15 mLs 2 (two) times daily. 10/28/22   [provider]  docusate sodium (COLACE) 100 MG  capsule Take 100 mg by mouth at bedtime.    [provider]  finasteride (PROSCAR) 5 MG tablet Take 5 mg by mouth at bedtime.     [provider]  fluticasone (FLONASE) 50 MCG/ACT nasal spray Place into both nostrils. 01/07/22   [provider]  furosemide (LASIX) 20 MG tablet TAKE 1 TABLET BY MOUTH EVERY OTHER DAY 01/25/23   Lennette Bihari, MD  isosorbide mononitrate (IMDUR) 30 MG 24 hr tablet Take 1 tablet by mouth once daily 06/08/22   Lennette Bihari, MD  losartan (COZAAR) 25 MG tablet Take 1 tablet by mouth once daily 06/08/22   Lennette Bihari, MD  mirabegron ER (MYRBETRIQ) 50 MG TB24 tablet Take 50 mg by mouth at bedtime.     [provider]  Multiple Vitamin (MULTIVITAMIN WITH MINERALS) TABS tablet Take 1 tablet by mouth daily.    [provider]  Multiple Vitamins-Minerals (PRESERVISION AREDS 2 PO) Take 1 tablet by mouth 2 (two) times daily.     [provider]  NITROSTAT 0.4 MG SL tablet DISSOLVE ONE TABLET UNDER THE TONGUE EVERY 5 MINUTES AS NEEDED FOR CHEST PAIN. Patient not taking: Reported on 11/25/2022 01/13/22   Lennette Bihari, MD  ONE TOUCH ULTRA TEST test strip 1 each by Other route as needed (glucose monoring).  04/21/15   [provider]  pantoprazole (PROTONIX) 40 MG tablet Take 1 tablet (40 mg total) by mouth daily. Patient taking differently: Take 40 mg by mouth daily. 05/21/15   Tereso Newcomer T, PA-C  PFIZER COVID-19 VAC BIVALENT injection  10/22/21   [provider]  pravastatin (PRAVACHOL) 80 MG tablet Take 80 mg by mouth at bedtime.    [provider]  predniSONE (DELTASONE) 10 MG tablet Take 10 mg by mouth. 09/21/22   [provider]    Family History    Family History  Problem Relation Age of Onset   Healthy Father    Ulcers Mother    Stroke Mother    He indicated that his mother is deceased. He indicated that his father is  deceased. He indicated that his sister is deceased. He indicated that his maternal grandmother is deceased. He indicated that his maternal grandfather is deceased. He indicated that his paternal grandmother is deceased. He indicated that his paternal grandfather is deceased.  Social History    Social History   Socioeconomic History   Marital status: Married    Spouse name: Not on file   Number of children: 2 d   Years of education: Not on file   Highest education level: Not on file  Occupational History   Occupation: accountant    Comment: [employer]  Tobacco Use   Smoking status: Never   Smokeless tobacco: Never  Vaping Use   Vaping status: Never Used  Substance and Sexual Activity   Alcohol use: No    Alcohol/week: 0.0 standard drinks of alcohol   Drug use: No   Sexual activity: Not on file  Other Topics Concern   Not on file  Social History Narrative   Not on file   Social Determinants of Health   Financial Resource Strain: Not on file  Food Insecurity: No Food Insecurity (05/10/2023)   Hunger Vital Sign    Worried About Running Out of Food in the Last Year: Never true    Ran Out of Food in the Last Year: Never true  Transportation Needs: No Transportation Needs (05/10/2023)   PRAPARE - Transportation  Lack of Transportation (Medical): No    Lack of Transportation (Non-Medical): No  Physical Activity: Not on file  Stress: Not on file  Social Connections: Not on file  Intimate Partner Violence: Not At Risk (05/10/2023)   Humiliation, Afraid, Rape, and Kick questionnaire    Fear of Current or Ex-Partner: No    Emotionally Abused: No    Physically Abused: No    Sexually Abused: No     Review of Systems    General:  No chills, fever, night sweats or weight changes.  Cardiovascular:  No chest pain, dyspnea on exertion, edema, orthopnea, palpitations, paroxysmal nocturnal dyspnea. Dermatological: No rash, lesions/masses Respiratory: No cough, dyspnea Urologic: No  hematuria, dysuria Abdominal:   No nausea, vomiting, diarrhea, bright red blood per rectum, melena, or hematemesis Neurologic:  No visual changes, wkns, changes in mental status. All other systems reviewed and are otherwise negative except as noted above.  Physical Exam    VS:  BP 126/66 (BP Location: Left Arm, Patient Position: Sitting, Cuff Size: Normal)   Pulse 100   Ht 5\' 3"  (1.6 m)   Wt 107 lb 12.8 oz (48.9 kg)   SpO2 95%   BMI 19.10 kg/m  , BMI Body mass index is 19.1 kg/m. GEN: Well nourished, well developed, in no acute distress. HEENT: normal. Neck: Supple, no JVD, carotid bruits, or masses. Cardiac: Irregularly irregular, no murmurs, rubs, or gallops. No clubbing, cyanosis, edema.  Radials/DP/PT 2+ and equal bilaterally.  Respiratory:  Respirations regular and unlabored, clear to auscultation bilaterally. GI: Soft, nontender, nondistended, BS + x 4. MS: no deformity or atrophy. Skin: warm and dry, no rash. Neuro:  Strength and sensation are intact. Psych: Normal affect.  Accessory Clinical Findings    Recent Labs: 05/10/2023: ALT 14 05/11/2023: TSH 0.922 05/17/2023: B Natriuretic Peptide 943.2; BUN 16; Creatinine, Ser 0.99; Hemoglobin 9.4; Magnesium 1.8; Platelets 221; Potassium 4.2; Sodium 135   Recent Lipid Panel    Component Value Date/Time   CHOL 117 02/13/2022 1236   TRIG 55 02/13/2022 1236   HDL 53 02/13/2022 1236   CHOLHDL 2.2 02/13/2022 1236   CHOLHDL 2.2 07/20/2017 0949   VLDL 10 09/07/2016 1027   LDLCALC 52 02/13/2022 1236   LDLCALC 45 07/20/2017 0949         ECG personally reviewed by me today-none today.  Echocardiogram 04/14/2021  IMPRESSIONS     1. Left ventricular ejection fraction, by estimation, is 35 to 40%. The  left ventricle has moderately decreased function. The left ventricle  demonstrates global hypokinesis. Left ventricular diastolic function could  not be evaluated.   2. Right ventricular systolic function is moderately  reduced. The right  ventricular size is mildly enlarged. There is mildly elevated pulmonary  artery systolic pressure.   3. Left atrial size was severely dilated.   4. Right atrial size was moderately dilated.   5. The mitral valve is abnormal. Mild mitral valve regurgitation. Severe  mitral annular calcification.   6. Tricuspid valve regurgitation is moderate.   7. The aortic valve is calcified. There is severe calcifcation of the  aortic valve. Aortic valve regurgitation is not visualized. Mild to  moderate aortic valve sclerosis/calcification is present, without any  evidence of aortic stenosis.   8. The inferior vena cava is normal in size with <50% respiratory  variability, suggesting right atrial pressure of 8 mmHg.   Comparison(s): Prior images reviewed side by side. Changes from prior  study are noted. EF reduced compared to prior  study.   Conclusion(s)/Recommendation(s): EF reduced on current study compared to  prior, with significant dyssynchrony. Both mitral and aortic valves are  heavily calcified with restricted mobility. However, neither has  significantly elevated gradients.   FINDINGS   Left Ventricle: Left ventricular ejection fraction, by estimation, is 35  to 40%. The left ventricle has moderately decreased function. The left  ventricle demonstrates global hypokinesis. The left ventricular internal  cavity size was normal in size.  There is no left ventricular hypertrophy. Abnormal (paradoxical) septal  motion, consistent with left bundle branch block. Left ventricular  diastolic function could not be evaluated due to atrial fibrillation. Left  ventricular diastolic function could not   be evaluated.   Right Ventricle: The right ventricular size is mildly enlarged. Right  vetricular wall thickness was not well visualized. Right ventricular  systolic function is moderately reduced. There is mildly elevated  pulmonary artery systolic pressure. The tricuspid    regurgitant velocity is 3.00 m/s, and with an assumed right atrial  pressure of 8 mmHg, the estimated right ventricular systolic pressure is  44.0 mmHg.   Left Atrium: Left atrial size was severely dilated.   Right Atrium: Right atrial size was moderately dilated.   Pericardium: There is no evidence of pericardial effusion.   Mitral Valve: The mitral valve is abnormal. There is moderate thickening  of the mitral valve leaflet(s). There is moderate calcification of the  mitral valve leaflet(s). Severe mitral annular calcification. Mild mitral  valve regurgitation. MV peak  gradient, 4.2 mmHg. The mean mitral valve gradient is 1.5 mmHg with  average heart rate of 78 bpm.   Tricuspid Valve: The tricuspid valve is normal in structure. Tricuspid  valve regurgitation is moderate . No evidence of tricuspid stenosis.   Aortic Valve: The aortic valve is calcified. There is severe calcifcation  of the aortic valve. There is severe aortic valve annular calcification.  Aortic valve regurgitation is not visualized. Mild to moderate aortic  valve sclerosis/calcification is  present, without any evidence of aortic stenosis. Aortic valve mean  gradient measures 3.5 mmHg. Aortic valve peak gradient measures 7.3 mmHg.  Aortic valve area, by VTI measures 1.36 cm.   Pulmonic Valve: The pulmonic valve was not well visualized. Pulmonic valve  regurgitation is mild. No evidence of pulmonic stenosis.   Aorta: The aortic root, ascending aorta and aortic arch are all  structurally normal, with no evidence of dilitation or obstruction.   Venous: The inferior vena cava is normal in size with less than 50%  respiratory variability, suggesting right atrial pressure of 8 mmHg.   IAS/Shunts: The atrial septum is grossly normal.    Assessment & Plan   1.  Acute on chronic combined diastolic and systolic CHF-weight stable.[name] NYHA class II-3.  Maintains baseline physical activity.  Reports compliance with his  low-sodium diet and furosemide.  Today he denies presyncope and syncope. Can continue furosemide to every fourth day Continue losartan, Imdur Continue low-sodium diet-reviewed Maintain physical activity.   Atrial fibrillation-heart rate today 100 BPM.  Compliant with apixaban.  No bleeding issues noted. Continue furosemide, Eliquis Avoid triggers caffeine, chocolate, EtOH, dehydration etc-reviewed  Essential hypertension-blood pressure today 126/66.  Previously discontinued amlodipine due to decreased blood pressure.  Blood pressure at home well-controlled. Continue Imdur, losartan Maintain blood pressure log    Hyperlipidemia-LDL 52 on 02/13/2022.   Continue pravastatin  High-fiber diet Follows with PCP   Coronary artery disease-no chest pain.  Denies exertional chest discomfort.  Status post PCI  and stent placement 2016 Continue current therapy Heart healthy high-fiber low-sodium diet Increase physical activity as tolerated   Disposition: Follow-up with Dr. Tresa Endo or me as scheduled.  Thomasene Ripple. Rupinder Livingston NP-C     07/05/2023, 2:30 PM [facility] [address] Office [phone] Fax [phone]    I spent 13 minutes examining this patient, reviewing medications, and using patient centered shared decision making involving her cardiac care.  Prior to her visit I spent greater than 20 minutes reviewing her past medical history,  medications, and prior cardiac tests.

## 2023-07-05 ENCOUNTER — Ambulatory Visit: Payer: Medicare HMO | Attending: General Practice | Admitting: General Practice

## 2023-07-05 ENCOUNTER — Encounter: Payer: Self-pay | Admitting: General Practice

## 2023-07-05 VITALS — BP 126/66 | HR 100 | Ht 63.0 in | Wt 107.8 lb

## 2023-07-05 DIAGNOSIS — I10 Essential (primary) hypertension: Secondary | ICD-10-CM | POA: Diagnosis not present

## 2023-07-05 DIAGNOSIS — I2510 Atherosclerotic heart disease of native coronary artery without angina pectoris: Secondary | ICD-10-CM | POA: Diagnosis not present

## 2023-07-05 DIAGNOSIS — I5043 Acute on chronic combined systolic (congestive) and diastolic (congestive) heart failure: Secondary | ICD-10-CM

## 2023-07-05 DIAGNOSIS — I4811 Longstanding persistent atrial fibrillation: Secondary | ICD-10-CM | POA: Diagnosis not present

## 2023-07-05 DIAGNOSIS — E785 Hyperlipidemia, unspecified: Secondary | ICD-10-CM | POA: Diagnosis not present

## 2023-07-05 NOTE — Patient Instructions (Signed)
Medication Instructions:  The current medical regimen is effective;  continue present plan and medications as directed. Please refer to the Current Medication list given to you today.  *If you need a refill on your cardiac medications before your next appointment, please call your pharmacy*  Lab Work: NONE If you have labs (blood work) drawn today and your tests are completely normal, you will receive your results only by: MyChart Message (if you have MyChart) OR A paper copy in the mail If you have any lab test that is abnormal or we need to change your treatment, we will call you to review the results.  Follow-Up: At [facility], you and your health needs are our priority.  As part of our continuing mission to provide you with exceptional heart care, we have created designated Provider Care Teams.  These Care Teams include your primary Cardiologist (physician) and Advanced Practice Providers (APPs -  Physician Assistants and Nurse Practitioners) who all work together to provide you with the care you need, when you need it.  Your next appointment:   KEEP SCHEDULED APPT   Provider:   Nicki Guadalajara, MD     Other Instructions SAMPLES PROVIDED

## 2023-07-12 ENCOUNTER — Encounter (INDEPENDENT_AMBULATORY_CARE_PROVIDER_SITE_OTHER): Payer: Medicare HMO | Admitting: Ophthalmology

## 2023-07-12 DIAGNOSIS — H43813 Vitreous degeneration, bilateral: Secondary | ICD-10-CM | POA: Diagnosis not present

## 2023-07-12 DIAGNOSIS — H35033 Hypertensive retinopathy, bilateral: Secondary | ICD-10-CM | POA: Diagnosis not present

## 2023-07-12 DIAGNOSIS — H3562 Retinal hemorrhage, left eye: Secondary | ICD-10-CM | POA: Diagnosis not present

## 2023-07-12 DIAGNOSIS — I10 Essential (primary) hypertension: Secondary | ICD-10-CM

## 2023-07-12 DIAGNOSIS — H353132 Nonexudative age-related macular degeneration, bilateral, intermediate dry stage: Secondary | ICD-10-CM

## 2023-07-12 DIAGNOSIS — H59032 Cystoid macular edema following cataract surgery, left eye: Secondary | ICD-10-CM | POA: Diagnosis not present

## 2023-07-12 DIAGNOSIS — M25512 Pain in left shoulder: Secondary | ICD-10-CM | POA: Diagnosis not present

## 2023-07-20 ENCOUNTER — Encounter (HOSPITAL_COMMUNITY): Payer: Self-pay | Admitting: General Surgery

## 2023-07-28 ENCOUNTER — Ambulatory Visit: Payer: Medicare HMO | Admitting: Cardiovascular Disease

## 2023-08-09 ENCOUNTER — Encounter (INDEPENDENT_AMBULATORY_CARE_PROVIDER_SITE_OTHER): Payer: Medicare HMO | Admitting: Ophthalmology

## 2023-08-17 ENCOUNTER — Encounter: Payer: Self-pay | Admitting: Cardiovascular Disease

## 2023-08-17 ENCOUNTER — Other Ambulatory Visit: Payer: Self-pay

## 2023-08-17 MED ORDER — APIXABAN 2.5 MG PO TABS: 2.5000 mg | ORAL_TABLET | Freq: Two times a day (BID) | ORAL | 0 refills | Status: DC

## 2023-08-17 NOTE — Telephone Encounter (Signed)
Spoke to patient's and Eliquis 2.5 mg 2 boxes placed up front for pick up.

## 2023-08-18 ENCOUNTER — Encounter (INDEPENDENT_AMBULATORY_CARE_PROVIDER_SITE_OTHER): Payer: Medicare HMO | Admitting: Ophthalmology

## 2023-08-23 DIAGNOSIS — R531 Weakness: Secondary | ICD-10-CM | POA: Diagnosis not present

## 2023-08-23 DIAGNOSIS — N3281 Overactive bladder: Secondary | ICD-10-CM | POA: Diagnosis not present

## 2023-08-23 DIAGNOSIS — Z23 Encounter for immunization: Secondary | ICD-10-CM | POA: Diagnosis not present

## 2023-08-23 DIAGNOSIS — I2510 Atherosclerotic heart disease of native coronary artery without angina pectoris: Secondary | ICD-10-CM | POA: Diagnosis not present

## 2023-08-23 DIAGNOSIS — E7800 Pure hypercholesterolemia, unspecified: Secondary | ICD-10-CM | POA: Diagnosis not present

## 2023-08-23 DIAGNOSIS — I110 Hypertensive heart disease with heart failure: Secondary | ICD-10-CM | POA: Diagnosis not present

## 2023-08-23 DIAGNOSIS — E1151 Type 2 diabetes mellitus with diabetic peripheral angiopathy without gangrene: Secondary | ICD-10-CM | POA: Diagnosis not present

## 2023-08-23 DIAGNOSIS — I5032 Chronic diastolic (congestive) heart failure: Secondary | ICD-10-CM | POA: Diagnosis not present

## 2023-08-25 NOTE — Telephone Encounter (Signed)
Tried calling patient back regarding messages. Both numbers went straight to vm

## 2023-08-26 NOTE — Telephone Encounter (Addendum)
Spoke to pt's daughter, Dakota [name], regarding concerns. Pt was at PCP on 08/23/23, they drew labs and Lasix was stopped.  Main complaints: Headache, not eating, very SOB when even just walking to bathroom.  The SOB does improve with use of Inhaler.  Spo2=95% today (was 87% on 10/30).  Weight is stable 102 lbs at PCP on 10/28. BP=126/64.  He is not wheezing, no edema noted.  She states he has somewhat improved today from yesterday.  Advised her to call 911 or go to nearest ER if he deteriorates over the weekend.  Also advised her to keep BP/VS/Weights log and bring in on Monday.  Scheduled him to see K. Lyman Bishop, NP on 08/30/2023 at 2:45pm.  Daughter in agreement with plan.

## 2023-08-29 ENCOUNTER — Inpatient Hospital Stay (HOSPITAL_COMMUNITY)
Admission: EM | Admit: 2023-08-29 | Discharge: 2023-09-02 | DRG: 243 | Disposition: A | Discharge status: Home / Self Care | Payer: Medicare HMO | Attending: Internal Medicine | Admitting: Internal Medicine

## 2023-08-29 ENCOUNTER — Emergency Department (HOSPITAL_COMMUNITY): Payer: Medicare HMO

## 2023-08-29 ENCOUNTER — Encounter (HOSPITAL_COMMUNITY): Payer: Self-pay

## 2023-08-29 ENCOUNTER — Other Ambulatory Visit: Payer: Self-pay

## 2023-08-29 DIAGNOSIS — I442 Atrioventricular block, complete: Principal | ICD-10-CM | POA: Diagnosis present

## 2023-08-29 DIAGNOSIS — R627 Adult failure to thrive: Secondary | ICD-10-CM | POA: Diagnosis not present

## 2023-08-29 DIAGNOSIS — R54 Age-related physical debility: Secondary | ICD-10-CM | POA: Diagnosis present

## 2023-08-29 DIAGNOSIS — Z7952 Long term (current) use of systemic steroids: Secondary | ICD-10-CM

## 2023-08-29 DIAGNOSIS — I4891 Unspecified atrial fibrillation: Principal | ICD-10-CM

## 2023-08-29 DIAGNOSIS — I5042 Chronic combined systolic (congestive) and diastolic (congestive) heart failure: Secondary | ICD-10-CM | POA: Diagnosis present

## 2023-08-29 DIAGNOSIS — D563 Thalassemia minor: Secondary | ICD-10-CM | POA: Diagnosis present

## 2023-08-29 DIAGNOSIS — Z950 Presence of cardiac pacemaker: Secondary | ICD-10-CM | POA: Diagnosis not present

## 2023-08-29 DIAGNOSIS — Z79899 Other long term (current) drug therapy: Secondary | ICD-10-CM

## 2023-08-29 DIAGNOSIS — I110 Hypertensive heart disease with heart failure: Secondary | ICD-10-CM | POA: Diagnosis not present

## 2023-08-29 DIAGNOSIS — Z7901 Long term (current) use of anticoagulants: Secondary | ICD-10-CM

## 2023-08-29 DIAGNOSIS — I4821 Permanent atrial fibrillation: Secondary | ICD-10-CM | POA: Diagnosis present

## 2023-08-29 DIAGNOSIS — Z955 Presence of coronary angioplasty implant and graft: Secondary | ICD-10-CM

## 2023-08-29 DIAGNOSIS — Z7401 Bed confinement status: Secondary | ICD-10-CM | POA: Diagnosis not present

## 2023-08-29 DIAGNOSIS — I2510 Atherosclerotic heart disease of native coronary artery without angina pectoris: Secondary | ICD-10-CM | POA: Diagnosis present

## 2023-08-29 DIAGNOSIS — E119 Type 2 diabetes mellitus without complications: Secondary | ICD-10-CM | POA: Diagnosis present

## 2023-08-29 DIAGNOSIS — Z882 Allergy status to sulfonamides status: Secondary | ICD-10-CM

## 2023-08-29 DIAGNOSIS — D638 Anemia in other chronic diseases classified elsewhere: Secondary | ICD-10-CM | POA: Diagnosis present

## 2023-08-29 DIAGNOSIS — I700 Atherosclerosis of aorta: Secondary | ICD-10-CM | POA: Diagnosis not present

## 2023-08-29 DIAGNOSIS — R001 Bradycardia, unspecified: Secondary | ICD-10-CM | POA: Diagnosis not present

## 2023-08-29 DIAGNOSIS — Z681 Body mass index (BMI) 19 or less, adult: Secondary | ICD-10-CM | POA: Diagnosis not present

## 2023-08-29 DIAGNOSIS — Z823 Family history of stroke: Secondary | ICD-10-CM

## 2023-08-29 DIAGNOSIS — R64 Cachexia: Secondary | ICD-10-CM | POA: Diagnosis present

## 2023-08-29 DIAGNOSIS — I499 Cardiac arrhythmia, unspecified: Secondary | ICD-10-CM | POA: Diagnosis not present

## 2023-08-29 DIAGNOSIS — Z951 Presence of aortocoronary bypass graft: Secondary | ICD-10-CM | POA: Diagnosis not present

## 2023-08-29 DIAGNOSIS — I959 Hypotension, unspecified: Secondary | ICD-10-CM | POA: Diagnosis not present

## 2023-08-29 DIAGNOSIS — R531 Weakness: Secondary | ICD-10-CM | POA: Diagnosis not present

## 2023-08-29 DIAGNOSIS — K219 Gastro-esophageal reflux disease without esophagitis: Secondary | ICD-10-CM | POA: Diagnosis present

## 2023-08-29 DIAGNOSIS — N400 Enlarged prostate without lower urinary tract symptoms: Secondary | ICD-10-CM | POA: Diagnosis present

## 2023-08-29 DIAGNOSIS — Z7984 Long term (current) use of oral hypoglycemic drugs: Secondary | ICD-10-CM

## 2023-08-29 DIAGNOSIS — M1990 Unspecified osteoarthritis, unspecified site: Secondary | ICD-10-CM | POA: Diagnosis present

## 2023-08-29 DIAGNOSIS — J9811 Atelectasis: Secondary | ICD-10-CM | POA: Diagnosis not present

## 2023-08-29 DIAGNOSIS — H9193 Unspecified hearing loss, bilateral: Secondary | ICD-10-CM | POA: Diagnosis present

## 2023-08-29 DIAGNOSIS — Z91018 Allergy to other foods: Secondary | ICD-10-CM

## 2023-08-29 DIAGNOSIS — I469 Cardiac arrest, cause unspecified: Secondary | ICD-10-CM | POA: Diagnosis not present

## 2023-08-29 DIAGNOSIS — I5043 Acute on chronic combined systolic (congestive) and diastolic (congestive) heart failure: Secondary | ICD-10-CM

## 2023-08-29 DIAGNOSIS — J449 Chronic obstructive pulmonary disease, unspecified: Secondary | ICD-10-CM | POA: Diagnosis not present

## 2023-08-29 DIAGNOSIS — I4811 Longstanding persistent atrial fibrillation: Secondary | ICD-10-CM

## 2023-08-29 DIAGNOSIS — Z974 Presence of external hearing-aid: Secondary | ICD-10-CM

## 2023-08-29 DIAGNOSIS — J811 Chronic pulmonary edema: Secondary | ICD-10-CM | POA: Diagnosis not present

## 2023-08-29 DIAGNOSIS — J90 Pleural effusion, not elsewhere classified: Secondary | ICD-10-CM | POA: Diagnosis not present

## 2023-08-29 DIAGNOSIS — I4430 Unspecified atrioventricular block: Secondary | ICD-10-CM | POA: Diagnosis not present

## 2023-08-29 DIAGNOSIS — Z888 Allergy status to other drugs, medicaments and biological substances status: Secondary | ICD-10-CM

## 2023-08-29 DIAGNOSIS — D509 Iron deficiency anemia, unspecified: Secondary | ICD-10-CM | POA: Diagnosis present

## 2023-08-29 DIAGNOSIS — R0989 Other specified symptoms and signs involving the circulatory and respiratory systems: Secondary | ICD-10-CM | POA: Diagnosis not present

## 2023-08-29 DIAGNOSIS — R0689 Other abnormalities of breathing: Secondary | ICD-10-CM | POA: Diagnosis not present

## 2023-08-29 HISTORY — DX: Chronic atrial fibrillation, unspecified: I48.20

## 2023-08-29 HISTORY — DX: Atherosclerotic heart disease of native coronary artery without angina pectoris: I25.10

## 2023-08-29 LAB — I-STAT CHEM 8, ED
BUN: 32 mg/dL — ABNORMAL HIGH (ref 8–23)
Calcium, Ion: 1.09 mmol/L — ABNORMAL LOW (ref 1.15–1.40)
Chloride: 105 mmol/L (ref 98–111)
Creatinine, Ser: 0.9 mg/dL (ref 0.61–1.24)
Glucose, Bld: 148 mg/dL — ABNORMAL HIGH (ref 70–99)
HCT: 38 % — ABNORMAL LOW (ref 39.0–52.0)
Hemoglobin: 12.9 g/dL — ABNORMAL LOW (ref 13.0–17.0)
Potassium: 4.3 mmol/L (ref 3.5–5.1)
Sodium: 141 mmol/L (ref 135–145)
TCO2: 26 mmol/L (ref 22–32)

## 2023-08-29 LAB — CBC
HCT: 36.1 % — ABNORMAL LOW (ref 39.0–52.0)
Hemoglobin: 10.9 g/dL — ABNORMAL LOW (ref 13.0–17.0)
MCH: 19.5 pg — ABNORMAL LOW (ref 26.0–34.0)
MCHC: 30.2 g/dL (ref 30.0–36.0)
MCV: 64.5 fL — ABNORMAL LOW (ref 80.0–100.0)
Platelets: 157 K/uL (ref 150–400)
RBC: 5.6 MIL/uL (ref 4.22–5.81)
RDW: 20.5 % — ABNORMAL HIGH (ref 11.5–15.5)
WBC: 6.8 K/uL (ref 4.0–10.5)
nRBC: 0 % (ref 0.0–0.2)

## 2023-08-29 LAB — COMPREHENSIVE METABOLIC PANEL
ALT: 21 U/L (ref 0–44)
AST: 28 U/L (ref 15–41)
Albumin: 3.6 g/dL (ref 3.5–5.0)
Alkaline Phosphatase: 46 U/L (ref 38–126)
Anion gap: 10 (ref 5–15)
BUN: 25 mg/dL — ABNORMAL HIGH (ref 8–23)
CO2: 23 mmol/L (ref 22–32)
Calcium: 8.6 mg/dL — ABNORMAL LOW (ref 8.9–10.3)
Chloride: 106 mmol/L (ref 98–111)
Creatinine, Ser: 0.86 mg/dL (ref 0.61–1.24)
GFR, Estimated: >60 mL/min (ref >60)
Glucose, Bld: 152 mg/dL — ABNORMAL HIGH (ref 70–99)
Potassium: 4.3 mmol/L (ref 3.5–5.1)
Sodium: 139 mmol/L (ref 135–145)
Total Bilirubin: 1.9 mg/dL — ABNORMAL HIGH (ref 0.3–1.2)
Total Protein: 6.3 g/dL — ABNORMAL LOW (ref 6.5–8.1)

## 2023-08-29 LAB — PROTIME-INR
INR: 1.4 — ABNORMAL HIGH (ref 0.8–1.2)
Prothrombin Time: 17.7 s — ABNORMAL HIGH (ref 11.4–15.2)

## 2023-08-29 LAB — MAGNESIUM: Magnesium: 2 mg/dL (ref 1.7–2.4)

## 2023-08-29 LAB — GLUCOSE, CAPILLARY
Glucose-Capillary: 125 mg/dL — ABNORMAL HIGH (ref 70–99)
Glucose-Capillary: 166 mg/dL — ABNORMAL HIGH (ref 70–99)
Glucose-Capillary: 185 mg/dL — ABNORMAL HIGH (ref 70–99)

## 2023-08-29 LAB — MRSA NEXT GEN BY PCR, NASAL: MRSA by PCR Next Gen: NOT DETECTED

## 2023-08-29 LAB — TROPONIN I (HIGH SENSITIVITY): Troponin I (High Sensitivity): 20 ng/L — ABNORMAL HIGH (ref <18)

## 2023-08-29 LAB — BRAIN NATRIURETIC PEPTIDE: B Natriuretic Peptide: 1217.7 pg/mL — ABNORMAL HIGH (ref 0.0–100.0)

## 2023-08-29 MED ORDER — DOPAMINE-DEXTROSE 3.2-5 MG/ML-% IV SOLN
5.0000 ug/kg/min | INTRAVENOUS | Status: DC
  Administered 2023-08-29: 5 ug/kg/min via INTRAVENOUS
  Filled 2023-08-29: qty 250

## 2023-08-29 MED ORDER — PRAVASTATIN SODIUM 40 MG PO TABS
80.0000 mg | ORAL_TABLET | Freq: Every day | ORAL | Status: DC
  Administered 2023-08-29 – 2023-09-01 (×4): 80 mg via ORAL
  Filled 2023-08-29 (×4): qty 2

## 2023-08-29 MED ORDER — INSULIN ASPART 100 UNIT/ML IJ SOLN
0.0000 [IU] | Freq: Three times a day (TID) | INTRAMUSCULAR | Status: DC
  Administered 2023-08-29 – 2023-08-30 (×2): 3 [IU] via SUBCUTANEOUS
  Administered 2023-08-30 – 2023-08-31 (×2): 2 [IU] via SUBCUTANEOUS
  Administered 2023-09-01: 3 [IU] via SUBCUTANEOUS
  Administered 2023-09-02: 5 [IU] via SUBCUTANEOUS

## 2023-08-29 MED ORDER — ORAL CARE MOUTH RINSE: 15.0000 mL | OROMUCOSAL | Status: DC | PRN

## 2023-08-29 MED ORDER — ORAL CARE MOUTH RINSE
15.0000 mL | OROMUCOSAL | Status: DC
  Administered 2023-08-30 – 2023-09-02 (×9): 15 mL via OROMUCOSAL

## 2023-08-29 MED ORDER — CALCIUM GLUCONATE-NACL 1-0.675 GM/50ML-% IV SOLN
1.0000 g | Freq: Once | INTRAVENOUS | Status: AC
  Administered 2023-08-29: 1000 mg via INTRAVENOUS
  Filled 2023-08-29: qty 50

## 2023-08-29 NOTE — ED Notes (Signed)
2 set of cultures sent to lab. No orders for same.

## 2023-08-29 NOTE — Plan of Care (Signed)

## 2023-08-29 NOTE — Progress Notes (Signed)
Patient's RN put in the IV consult for PIV access due to vasopressors. However, there were two working IV access. Explained patient's RN that it doesn't need USGPIV access for vasopressors any more. Informed patient's RN that put in the IV consult if patient needs PIV access. HS [employer]

## 2023-08-29 NOTE — Progress Notes (Deleted)
  Cardiology Office Note:  .   Date:  08/29/2023  ID:  Dakota [name], DOB [birth year], MRN [id number] PCP: [name], MD  [facility] Providers Cardiologist:  Nicki Guadalajara, MD    }   History of Present Illness: .   Dakota [name] is a 89 y.o. male ***  ROS: ***  Studies Reviewed: .        *** EKG Interpretation Date/Time:    Ventricular Rate:    PR Interval:    QRS Duration:    QT Interval:    QTC Calculation:   R Axis:      Text Interpretation:      Physical Exam:   VS:  There were no vitals taken for this visit.   Wt Readings from Last 3 Encounters:  08/29/23 103 lb 9.9 oz (47 kg)  07/05/23 107 lb 12.8 oz (48.9 kg)  05/11/23 108 lb 3.9 oz (49.1 kg)    GEN: Well nourished, well developed in no acute distress NECK: No JVD; No carotid bruits CARDIAC: ***RRR, no murmurs, rubs, gallops RESPIRATORY:  Clear to auscultation without rales, wheezing or rhonchi  ABDOMEN: Soft, non-tender, non-distended EXTREMITIES:  No edema; No deformity   ASSESSMENT AND PLAN: .   ***    {Are you ordering a CV Procedure (e.g. stress test, cath, DCCV, TEE, etc)?   Press F2        :[id number]}    Signed, Bettey Mare. Liborio Nixon, ANP, AACC

## 2023-08-29 NOTE — ED Triage Notes (Signed)
Pt BIB EMS from home. Bradycardiac, lethargic, and tachypneic. C/O weakness.

## 2023-08-29 NOTE — H&P (Signed)
NAME:  Dakota [name], MRN:  [id number], DOB:  03-Feb-1926, LOS: 0 ADMISSION DATE:  08/29/2023, CONSULTATION DATE: 08/29/2023 REFERRING MD:  Cathren Laine , CHIEF COMPLAINT: Generalized weakness, nausea  History of Present Illness:  89 year old male with coronary artery disease status post CABG, chronic combined systolic and diastolic heart failure, hypertension, diabetes type 2 and BPH who was brought into the emergency department by his daughter with a complaint of generalized weakness associated with nausea for past few days.  As per patient's daughter he was in usual state of health until June of this year, in July he had bilateral inguinal hernia surgery since then he has been feeling weak and not that active.  For the past 2 days he has been feeling generalized weak, not getting out of bed, not been able to eat or drink.  He was brought in the emergency department, upon arrival his heart rate was noted to be in the low 30s, EKG was done which is consistent with complete heart block.  Patient was seen by cardiology, he was started on dopamine without much improvement in heart rate though his blood pressure remained stable.  Pertinent  Medical History   Past Medical History:  Diagnosis Date   Aortic atherosclerosis (HCC)    Arthritis    Benign localized prostatic hyperplasia with lower urinary tract symptoms (LUTS)    Chronic chest pain    takes imdur   Chronic combined systolic (congestive) and diastolic (congestive) heart failure (HCC)    followed by cardiology   Chronic dyspnea    Cognitive impairment    COPD, moderate (HCC)    Edema of both lower extremities    takes lasix and uses teds   First degree heart block    GERD (gastroesophageal reflux disease)    Heart murmur    History of cardiovascular stress test    Lexiscan Myoview 7/16:  EF 51%, inferior, inferolateral ischemia; Intermediate Risk   History of smallpox    HOH (hard of hearing)    even with hearing aids   Hypertension     Ischemic heart disease 2005   Dr Tresa Endo a. s/p CABG;  b.  LHC 05/23/15:  S-RPAVB ok, S-D2 ok, S-OM2 99% (s/p Synergy DES), L-LAD ok, EF normal (complicated by pseudoaneurysm)    LBBB (left bundle branch block)    Right arm weakness    S/P CABG x 4    01-08-2004   S/P drug eluting coronary stent placement    05-21-2015  PCI and DES x1 to SVG--OM graft   Thalassemia minor    Type 2 diabetes, diet controlled (HCC)    Wears glasses    Wears hearing aid in both ears      Significant Hospital Events: Including procedures, antibiotic start and stop dates in addition to other pertinent events     Interim History / Subjective:  As above  Objective   Blood pressure (!) 159/60, pulse (!) 33, temperature 97.7 F (36.5 C), temperature source Rectal, resp. rate 11, weight 47 kg, SpO2 100%.       No intake or output data in the 24 hours ending 08/29/23 1527 Filed Weights   08/29/23 1300  Weight: 47 kg    Examination: General: Chronically ill-appearing elderly cachectic male, lying on the bed HEENT: [place]/AT, eyes anicteric.  Dry mucus membranes Neuro: Lethargic, opens eyes with vocal stimuli, following simple commands Chest: Coarse breath sounds, no wheezes or rhonchi Heart: Bradycardic in 30s Abdomen: Soft, nontender, nondistended, bowel sounds  present Skin: No rash  EKG: Showing complete heart block with ventricular rate 32 Labs and images were reviewed   Resolved Hospital Problem list     Assessment & Plan:  Severe symptomatic bradycardia in the setting of complete heart block Coronary artery disease status post CABG Chronic combined systolic and diastolic heart failure Chronic A-fib Hypertension Diabetes type 2 Anemia of chronic disease  Patient presented with generalized weakness, noted to be in complete heart block.  Seen by cardiology, started on dopamine without much improvement in the heart rate though his blood pressure has been stable even without dopamine Plan  to continue dopamine for now, if he gets symptomatic like hypotensive or with increasing shortness of breath, he will require temporary pacing wire as he is on Eliquis for paroxysmal A-fib and he took it this morning, after Eliquis washout patient will need PPM which is tentatively scheduled for Tuesday Continue pravastatin Holding Lasix even though x-ray chest is suggestive of mild pulmonary edema, clinically looks dry Hold antihypertensive meds for now Will hold Eliquis as he took it this morning, will wait for washout Continue sliding scale insulin with CBG goal 140-180 His hemoglobin last time was checked it was 6 Avoid sedation Monitor H&H   Best Practice (right click and "Reselect all SmartList Selections" daily)   Diet/type: clear liquids DVT prophylaxis: SCD GI prophylaxis: N/A Lines: N/A Foley:  N/A Code Status:  full code Last date of multidisciplinary goals of care discussion [11/3: Patient's daughter was updated at bedside, decision was to continue full scope of care]  Labs   CBC: Recent Labs  Lab 08/29/23 1218 08/29/23 1220  WBC 6.8  --   HGB 10.9* 12.9*  HCT 36.1* 38.0*  MCV 64.5*  --   PLT 157  --     Basic Metabolic Panel: Recent Labs  Lab 08/29/23 1218 08/29/23 1220  NA 139 141  K 4.3 4.3  CL 106 105  CO2 23  --   GLUCOSE 152* 148*  BUN 25* 32*  CREATININE 0.86 0.90  CALCIUM 8.6*  --    GFR: Estimated Creatinine Clearance: 32.6 mL/min (by C-G formula based on SCr of 0.9 mg/dL). Recent Labs  Lab 08/29/23 1218  WBC 6.8    Liver Function Tests: Recent Labs  Lab 08/29/23 1218  AST 28  ALT 21  ALKPHOS 46  BILITOT 1.9*  PROT 6.3*  ALBUMIN 3.6   No results for input(s): "LIPASE", "AMYLASE" in the last 168 hours. No results for input(s): "AMMONIA" in the last 168 hours.  ABG    Component Value Date/Time   TCO2 26 08/29/2023 1220     Coagulation Profile: Recent Labs  Lab 08/29/23 1218  INR 1.4*    Cardiac Enzymes: No results  for input(s): "CKTOTAL", "CKMB", "CKMBINDEX", "TROPONINI" in the last 168 hours.  HbA1C: Hgb A1c MFr Bld  Date/Time Value Ref Range Status  05/10/2023 04:34 PM 6.0 (H) 4.8 - 5.6 % Final    Comment:    (NOTE) Pre diabetes:          5.7%-6.4%  Diabetes:              >6.4%  Glycemic control for   <7.0% adults with diabetes   09/07/2016 10:27 AM 6.0 (H) <5.7 % Final    Comment:      For someone without known diabetes, a hemoglobin A1c value between 5.7% and 6.4% is consistent with prediabetes and should be confirmed with a follow-up test.   For someone  with known diabetes, a value <7% indicates that their diabetes is well controlled. A1c targets should be individualized based on duration of diabetes, age, co-morbid conditions and other considerations.   This assay result is consistent with an increased risk of diabetes.   Currently, no consensus exists regarding use of hemoglobin A1c for diagnosis of diabetes in children.       CBG: No results for input(s): "GLUCAP" in the last 168 hours.  Review of Systems:   Denies chest pain, palpitation, headache, fever, chills or other complaints  Past Medical History:  He,  has a past medical history of Aortic atherosclerosis (HCC), Arthritis, Benign localized prostatic hyperplasia with lower urinary tract symptoms (LUTS), Chronic chest pain, Chronic combined systolic (congestive) and diastolic (congestive) heart failure (HCC), Chronic dyspnea, Cognitive impairment, COPD, moderate (HCC), Edema of both lower extremities, First degree heart block, GERD (gastroesophageal reflux disease), Heart murmur, History of cardiovascular stress test, History of smallpox, HOH (hard of hearing), Hypertension, Ischemic heart disease (2005), LBBB (left bundle branch block), Right arm weakness, S/P CABG x 4, S/P drug eluting coronary stent placement, Thalassemia minor, Type 2 diabetes, diet controlled (HCC), Wears glasses, and Wears hearing aid in both ears.    Surgical History:   Past Surgical History:  Procedure Laterality Date   APPENDECTOMY     CARDIAC CATHETERIZATION  1989   in [place]   normal     CARDIAC CATHETERIZATION N/A 05/23/2015   Procedure: Left Heart Cath and Cors/Grafts Angiography;  Surgeon: Lennette Bihari, MD;  Location: [facility] INVASIVE CV LAB;  Service: Cardiovascular;  Laterality: N/A;   CARDIAC CATHETERIZATION N/A 05/23/2015   Procedure: Coronary Stent Intervention;  Surgeon: Lennette Bihari, MD;  Location: [facility] INVASIVE CV LAB;  Service: Cardiovascular;  Laterality: N/A;   CARDIAC CATHETERIZATION  01-04-2004   dr Melburn Popper  @[facility]    severe 3V CAD, normal LVF   CATARACT EXTRACTION W/ INTRAOCULAR LENS  IMPLANT, BILATERAL     CORONARY ARTERY BYPASS GRAFT  01-08-2004  dr hendrickson @[facility]    LIMA to LAD,  SVG to D1,  SVG to OM,  SVG to PDA   FOOT SURGERY Right    INGUINAL HERNIA REPAIR Bilateral 1978   INGUINAL HERNIA REPAIR Bilateral 05/12/2023   Procedure: HERNIA REPAIR INGUINAL ADULT;  Surgeon: Griselda Miner, MD;  Location: [facility] OR;  Service: General;  Laterality: Bilateral;   INSERTION OF MESH Bilateral 05/12/2023   Procedure: INSERTION OF MESH;  Surgeon: Griselda Miner, MD;  Location: [facility] OR;  Service: General;  Laterality: Bilateral;   MASS EXCISION Left 11/20/2019   Procedure: EXCISION OF SEBACEOUS CYST CHEST;  Surgeon: Sheliah Hatch De Blanch, MD;  Location: [facility];  Service: General;  Laterality: Left;     Social History:   reports that he has never smoked. He has never used smokeless tobacco. He reports that he does not drink alcohol and does not use drugs.   Family History:  His family history includes Healthy in his father; Stroke in his mother; Ulcers in his mother.   Allergies Allergies  Allergen Reactions   Aceon [Perindopril Erbumine] Shortness Of Breath and Cough   Sulfa Antibiotics Swelling   Pork-Derived Products     muslim     Home Medications  Prior to Admission medications   Medication  Sig Start Date End Date Taking? Authorizing Provider  acetaminophen (TYLENOL) 500 MG tablet Take 1 tablet (500 mg total) by mouth every 8 (eight) hours as needed for moderate pain. 05/17/23  05/16/24 Yes Leroy Sea, MD  albuterol (PROVENTIL HFA;VENTOLIN HFA) 108 (90 Base) MCG/ACT inhaler Inhale 2 puffs into the lungs every 4 (four) hours as needed for wheezing or shortness of breath. 01/03/16  Yes Charm Rings, MD  apixaban Everlene Balls) 2.5 MG TABS tablet Take 1 tablet by mouth twice daily 05/25/23  Yes Lennette Bihari, MD  docusate sodium (COLACE) 100 MG capsule Take 1 capsule (100 mg total) by mouth 2 (two) times daily 05/17/23  Yes Leroy Sea, MD  finasteride (PROSCAR) 5 MG tablet Take 5 mg by mouth at bedtime.    Yes [provider]  furosemide (LASIX) 20 MG tablet 20 MG EVERY 4 DAYS Patient taking differently: Take 20 mg by mouth See admin instructions. 20 MG EVERY 4 DAYS 03/06/23  Yes Ronney Asters, NP  isosorbide mononitrate (IMDUR) 30 MG 24 hr tablet Take 1 tablet by mouth once daily 06/07/23  Yes Cleaver, Thomasene Ripple, NP  ketorolac (ACULAR) 0.5 % ophthalmic solution Place 1 drop into the left eye 4 (four) times daily.   Yes [provider]  losartan (COZAAR) 25 MG tablet Take 1 tablet by mouth once daily 06/29/23  Yes Lennette Bihari, MD  metFORMIN (GLUCOPHAGE) 500 MG tablet Take 500 mg by mouth 2 (two) times daily.   Yes [provider]  mirabegron ER (MYRBETRIQ) 50 MG TB24 tablet Take 50 mg by mouth at bedtime.    Yes [provider]  Multiple Vitamin (MULTIVITAMIN WITH MINERALS) TABS tablet Take 1 tablet by mouth daily.   Yes [provider]  Multiple Vitamins-Minerals (PRESERVISION AREDS 2 PO) Take 1 tablet by mouth 2 (two) times daily.    Yes [provider]  NITROSTAT 0.4 MG SL tablet DISSOLVE ONE TABLET UNDER THE TONGUE EVERY 5 MINUTES AS NEEDED FOR CHEST PAIN. Patient taking differently: Place 0.4 mg under the tongue every 5  (five) minutes as needed for chest pain. 01/13/22  Yes Lennette Bihari, MD  pantoprazole (PROTONIX) 40 MG tablet Take 1 tablet (40 mg total) by mouth daily. Patient taking differently: Take 40 mg by mouth daily. 05/21/15  Yes Weaver, Scott T, PA-C  pravastatin (PRAVACHOL) 80 MG tablet Take 80 mg by mouth at bedtime.   Yes [provider]  prednisoLONE acetate (PRED FORTE) 1 % ophthalmic suspension Place 1 drop into the left eye 4 (four) times daily.   Yes [provider]  predniSONE (DELTASONE) 10 MG tablet Take 1 tablet by mouth daily.   Yes [provider]  Blood Glucose Monitoring Suppl (ONE TOUCH ULTRA 2) w/Device KIT daily in the afternoon. 11/02/22   [provider]  ONE TOUCH ULTRA TEST test strip 1 each by Other route as needed (glucose monoring).  04/21/15   [provider]     Critical care time:     The patient is critically ill due to severe symptomatic bradycardia due to complete heart block critical care was necessary to treat or prevent imminent or life-threatening deterioration.  Critical care was time spent personally by me on the following activities: development of treatment plan with patient and/or surrogate as well as nursing, discussions with consultants, evaluation of patient's response to treatment, examination of patient, obtaining history from patient or surrogate, ordering and performing treatments and interventions, ordering and review of laboratory studies, ordering and review of radiographic studies, pulse oximetry, re-evaluation of patient's condition and participation in multidisciplinary rounds.   During this encounter critical care time was devoted to  patient care services described in this note for 37 minutes.     Cheri Fowler, MD [facility] See Amion for pager If no response to pager, please call [phone] until 7pm After 7pm, Please call E-link [phone]

## 2023-08-29 NOTE — ED Provider Notes (Addendum)
[place] EMERGENCY DEPARTMENT AT [facility] Provider Note   CSN: [id number] Arrival date & time: 08/29/23  1153     History  Chief Complaint  Patient presents with   Bradycardia    Dakota [name] is a 89 y.o. male.  Pt with  general weakness in past two days. Pt v limited historian - level 5 caveat. No reported trauma/fall. No fevers. ?decreased po intake. Pt unaware of medication change. Pt denies specific pain. No headache. No chest pain. No abd pain. No gu c/o.  EMS notes hr low, in 30s, and bp normal.   The history is provided by the patient, medical records and the EMS personnel. The history is limited by the condition of the patient.       Home Medications Prior to Admission medications   Medication Sig Start Date End Date Taking? Authorizing Provider  acetaminophen (TYLENOL) 500 MG tablet Take 1 tablet (500 mg total) by mouth every 8 (eight) hours as needed for moderate pain. 05/17/23 05/16/24 Yes Leroy Sea, MD  albuterol (PROVENTIL HFA;VENTOLIN HFA) 108 (90 Base) MCG/ACT inhaler Inhale 2 puffs into the lungs every 4 (four) hours as needed for wheezing or shortness of breath. 01/03/16  Yes Charm Rings, MD  apixaban Everlene Balls) 2.5 MG TABS tablet Take 1 tablet by mouth twice daily 05/25/23  Yes Lennette Bihari, MD  docusate sodium (COLACE) 100 MG capsule Take 1 capsule (100 mg total) by mouth 2 (two) times daily 05/17/23  Yes Leroy Sea, MD  finasteride (PROSCAR) 5 MG tablet Take 5 mg by mouth at bedtime.    Yes [provider]  furosemide (LASIX) 20 MG tablet 20 MG EVERY 4 DAYS Patient taking differently: Take 20 mg by mouth See admin instructions. 20 MG EVERY 4 DAYS 03/06/23  Yes Ronney Asters, NP  isosorbide mononitrate (IMDUR) 30 MG 24 hr tablet Take 1 tablet by mouth once daily 06/07/23  Yes Cleaver, Thomasene Ripple, NP  ketorolac (ACULAR) 0.5 % ophthalmic solution Place 1 drop into the left eye 4 (four) times daily.   Yes [provider]   losartan (COZAAR) 25 MG tablet Take 1 tablet by mouth once daily 06/29/23  Yes Lennette Bihari, MD  metFORMIN (GLUCOPHAGE) 500 MG tablet Take 500 mg by mouth 2 (two) times daily.   Yes [provider]  mirabegron ER (MYRBETRIQ) 50 MG TB24 tablet Take 50 mg by mouth at bedtime.    Yes [provider]  Multiple Vitamin (MULTIVITAMIN WITH MINERALS) TABS tablet Take 1 tablet by mouth daily.   Yes [provider]  Multiple Vitamins-Minerals (PRESERVISION AREDS 2 PO) Take 1 tablet by mouth 2 (two) times daily.    Yes [provider]  NITROSTAT 0.4 MG SL tablet DISSOLVE ONE TABLET UNDER THE TONGUE EVERY 5 MINUTES AS NEEDED FOR CHEST PAIN. Patient taking differently: Place 0.4 mg under the tongue every 5 (five) minutes as needed for chest pain. 01/13/22  Yes Lennette Bihari, MD  pantoprazole (PROTONIX) 40 MG tablet Take 1 tablet (40 mg total) by mouth daily. Patient taking differently: Take 40 mg by mouth daily. 05/21/15  Yes Weaver, Scott T, PA-C  pravastatin (PRAVACHOL) 80 MG tablet Take 80 mg by mouth at bedtime.   Yes [provider]  prednisoLONE acetate (PRED FORTE) 1 % ophthalmic suspension Place 1 drop into the left eye 4 (four) times daily.   Yes [provider]  predniSONE (DELTASONE) 10 MG tablet Take 1  tablet by mouth daily.   Yes [provider]  Blood Glucose Monitoring Suppl (ONE TOUCH ULTRA 2) w/Device KIT daily in the afternoon. 11/02/22   [provider]  ONE TOUCH ULTRA TEST test strip 1 each by Other route as needed (glucose monoring).  04/21/15   [provider]      Allergies    Aceon [perindopril erbumine], Sulfa antibiotics, and Pork-derived products    Review of Systems   Review of Systems  Constitutional:  Negative for fever.  Respiratory:  Negative for shortness of breath.   Cardiovascular:  Negative for chest pain.  Gastrointestinal:  Negative for abdominal pain and vomiting.  Genitourinary:   Negative for dysuria.  Neurological:  Positive for weakness. Negative for headaches.    Physical Exam Updated Vital Signs BP (!) 159/60   Pulse (!) 33   Temp 97.7 F (36.5 C) (Rectal)   Resp 11   Wt 47 kg   SpO2 100%   BMI 18.35 kg/m  Physical Exam Vitals and nursing note reviewed.  Constitutional:      Appearance: He is well-developed.     Comments: Very weak/frail appearing.   HENT:     Head: Atraumatic.     Nose: Nose normal.     Mouth/Throat:     Mouth: Mucous membranes are moist.     Pharynx: Oropharynx is clear.  Eyes:     General: No scleral icterus.    Conjunctiva/sclera: Conjunctivae normal.     Pupils: Pupils are equal, round, and reactive to light.  Neck:     Trachea: No tracheal deviation.     Comments: No stiffness or rigidity.  Cardiovascular:     Rate and Rhythm: Bradycardia present. Rhythm irregular.     Pulses: Normal pulses.     Heart sounds: Normal heart sounds. No murmur heard.    No friction rub. No gallop.  Pulmonary:     Effort: Pulmonary effort is normal. No accessory muscle usage or respiratory distress.     Breath sounds: Normal breath sounds.  Abdominal:     General: Bowel sounds are normal. There is no distension.     Palpations: Abdomen is soft.     Tenderness: There is no abdominal tenderness.  Genitourinary:    Comments: No cva tenderness. Musculoskeletal:        General: No swelling or tenderness.     Cervical back: Normal range of motion and neck supple. No rigidity.     Right lower leg: No edema.     Left lower leg: No edema.  Skin:    General: Skin is warm and dry.     Findings: No rash.  Neurological:     Mental Status: He is alert.     Comments: Alert, speech v quiet, slow, minimal. Motor/sens grossly intact bil.   Psychiatric:        Mood and Affect: Mood normal.     ED Results / Procedures / Treatments   Labs (all labs ordered are listed, but only abnormal results are displayed) Results for orders placed or  performed during the hospital encounter of 08/29/23  Comprehensive metabolic panel  Result Value Ref Range   Sodium 139 135 - 145 mmol/L   Potassium 4.3 3.5 - 5.1 mmol/L   Chloride 106 98 - 111 mmol/L   CO2 23 22 - 32 mmol/L   Glucose, Bld 152 (H) 70 - 99 mg/dL   BUN 25 (H) 8 - 23 mg/dL   Creatinine, Ser 3.71  0.61 - 1.24 mg/dL   Calcium 8.6 (L) 8.9 - 10.3 mg/dL   Total Protein 6.3 (L) 6.5 - 8.1 g/dL   Albumin 3.6 3.5 - 5.0 g/dL   AST 28 15 - 41 U/L   ALT 21 0 - 44 U/L   Alkaline Phosphatase 46 38 - 126 U/L   Total Bilirubin 1.9 (H) 0.3 - 1.2 mg/dL   GFR, Estimated >16 >10 mL/min   Anion gap 10 5 - 15  CBC  Result Value Ref Range   WBC 6.8 4.0 - 10.5 K/uL   RBC 5.60 4.22 - 5.81 MIL/uL   Hemoglobin 10.9 (L) 13.0 - 17.0 g/dL   HCT 96.0 (L) 45.4 - 09.8 %   MCV 64.5 (L) 80.0 - 100.0 fL   MCH 19.5 (L) 26.0 - 34.0 pg   MCHC 30.2 30.0 - 36.0 g/dL   RDW 11.9 (H) 14.7 - 82.9 %   Platelets 157 150 - 400 K/uL   nRBC 0.0 0.0 - 0.2 %  Protime-INR  Result Value Ref Range   Prothrombin Time 17.7 (H) 11.4 - 15.2 seconds   INR 1.4 (H) 0.8 - 1.2  I-stat chem 8, ED  Result Value Ref Range   Sodium 141 135 - 145 mmol/L   Potassium 4.3 3.5 - 5.1 mmol/L   Chloride 105 98 - 111 mmol/L   BUN 32 (H) 8 - 23 mg/dL   Creatinine, Ser 5.62 0.61 - 1.24 mg/dL   Glucose, Bld 130 (H) 70 - 99 mg/dL   Calcium, Ion 8.65 (L) 1.15 - 1.40 mmol/L   TCO2 26 22 - 32 mmol/L   Hemoglobin 12.9 (L) 13.0 - 17.0 g/dL   HCT 78.4 (L) 69.6 - 29.5 %  Troponin I (High Sensitivity)  Result Value Ref Range   Troponin I (High Sensitivity) 20 (H) <18 ng/L   DG Chest Port 1 View  Result Date: 08/29/2023 CLINICAL DATA:  Weakness EXAM: PORTABLE CHEST 1 VIEW COMPARISON:  05/15/2023 FINDINGS: Sternal wires. Enlarged cardiopericardial silhouette. Elevation left hemidiaphragm with tiny left effusion. Vascular congestion. Mild interstitial edema suspected. No pneumothorax. Overlapping cardiac leads and defibrillator pads. Film is  rotated to the left. IMPRESSION: Postop chest with enlarged heart. Vascular congestion with some interstitial edema. Tiny effusions. Film is rotated to the left Electronically Signed   By: Karen Kays M.D.   On: 08/29/2023 14:07     EKG EKG Interpretation Date/Time:  Sunday August 29 2023 12:00:50 EST Ventricular Rate:  57 PR Interval:    QRS Duration:  161 QT Interval:  576 QTC Calculation: 561 R Axis:   -87  Text Interpretation: Idioventricular rhythm with AV block Nonspecific IVCD with LAD Confirmed by Cathren Laine ([id number]) on 08/29/2023 12:25:20 PM  Radiology DG Chest Port 1 View  Result Date: 08/29/2023 CLINICAL DATA:  Weakness EXAM: PORTABLE CHEST 1 VIEW COMPARISON:  05/15/2023 FINDINGS: Sternal wires. Enlarged cardiopericardial silhouette. Elevation left hemidiaphragm with tiny left effusion. Vascular congestion. Mild interstitial edema suspected. No pneumothorax. Overlapping cardiac leads and defibrillator pads. Film is rotated to the left. IMPRESSION: Postop chest with enlarged heart. Vascular congestion with some interstitial edema. Tiny effusions. Film is rotated to the left Electronically Signed   By: Karen Kays M.D.   On: 08/29/2023 14:07    Procedures Procedures    Medications Ordered in ED Medications  DOPamine (INTROPIN) 800 mg in dextrose 5 % 250 mL (3.2 mg/mL) infusion (5 mcg/kg/min  47 kg Intravenous New Bag/Given 08/29/23 1342)    ED Course/  Medical Decision Making/ A&P                                 Medical Decision Making Problems Addressed: Atrial fibrillation with slow ventricular response (HCC): acute illness or injury with systemic symptoms that poses a threat to life or bodily functions Generalized weakness: acute illness or injury with systemic symptoms that poses a threat to life or bodily functions Symptomatic bradycardia: acute illness or injury with systemic symptoms that poses a threat to life or bodily functions  Amount and/or Complexity  of Data Reviewed Independent Historian: EMS    Details: Ems and family, discussed pt.  External Data Reviewed: notes. Labs: ordered. Decision-making details documented in ED Course. Radiology: ordered and independent interpretation performed. Decision-making details documented in ED Course. ECG/medicine tests: ordered and independent interpretation performed. Decision-making details documented in ED Course. Discussion of management or test interpretation with external provider(s): Cardiology, medicine   Risk Prescription drug management. Decision regarding hospitalization.   Iv ns. Continuous pulse ox and cardiac monitoring. Labs ordered/sent. Imaging ordered.   Differential diagnosis includes bradycardia, heart block, anemia, dehydration, etc. Dispo decision including potential need for admission considered - will get labs and imaging and reassess.   Reviewed nursing notes and prior charts for additional history. External reports reviewed. Additional history from: EMS.  On record review - interestingly several office/clinic notes show pt with heart rate 80-100, however, on patients last visit to his doctors at [facility] this past week, patients HR noted to be 35 - no note of impression/plan as related low hr then.   Cardiac monitor: afib, rate 30.   Labs reviewed/interpreted by me - chem unremarkable. Wbc normal. Hgb 11.  Xrays reviewed/interpreted by me - no pna.   Cardiology emergently consulted, discussed pt, hr 30, etc, with Dr Diona Browner - he reviewed ecg and indicates they will see in ED, to consult medicine for admission.  CRITICAL CARE re: severe symptomatic bradycardia, afib w slow vent response. Performed by: Suzi Roots Total critical care time: 115 minutes Critical care time was exclusive of separately billable procedures and treating other patients. Critical care was necessary to treat or prevent imminent or life-threatening deterioration. Critical care was time  spent personally by me on the following activities: development of treatment plan with patient and/or surrogate as well as nursing, discussions with consultants, evaluation of patient's response to treatment, examination of patient, obtaining history from patient or surrogate, ordering and performing treatments and interventions, ordering and review of laboratory studies, ordering and review of radiographic studies, pulse oximetry and re-evaluation of patient's condition.   Cardiology has seen and placed on dopamine gtt with possible plans for pacer.   Medicine indicates as on dopamine gtt would have to go to icu team. Critical care team consulted.   Recheck pt, hr 35, still weak/lethargic, but arousable. Bp currently ok. No cp or sob.         Final Clinical Impression(s) / ED Diagnoses Final diagnoses:  Atrial fibrillation with slow ventricular response (HCC)  Symptomatic bradycardia  Generalized weakness    Rx / DC Orders ED Discharge Orders     None           Cathren Laine, MD 08/29/23 1431

## 2023-08-29 NOTE — ED Notes (Signed)
Nurse aware of pt low HR

## 2023-08-29 NOTE — Consult Note (Signed)
Cardiology Consultation   Patient ID: Dakota [name] MRN: [id number]; DOB: April 06, 1926  Admit date: 08/29/2023 Date of Consult: 08/29/2023  PCP:  [name], MD   [facility] Providers Cardiologist:  Nicki Guadalajara, MD   {  Patient Profile:   Dakota [name] is a 89 y.o. male with a hx of atrial fib who is being seen 08/29/2023 for the evaluation of comlete heart block at the request of Dr. Denton Lank.  History of Present Illness:   Dakota [name] is a 89 yo man with a h/o perm afib, chronic systolic heart failure, EF 40%, LBBB, and chronic peripheral edema. He lives with his daughter and was noted for several days to have worsening fatigue, anorexia and over all failure to thrive. He has not been eating well. He refused hospitalization. He saw Dr. [name] on Thursday. He was noted to be bradycardic in the 30's on presentation to the ED and is admitted for additional eval. He is not on any AV nodal blocking drugs. He has been taking Eliquis, last dose this morning. He as had a cough but no fever.    Past Medical History:  Diagnosis Date   Aortic atherosclerosis (HCC)    Arthritis    Benign localized prostatic hyperplasia with lower urinary tract symptoms (LUTS)    Chronic chest pain    takes imdur   Chronic combined systolic (congestive) and diastolic (congestive) heart failure (HCC)    followed by cardiology   Chronic dyspnea    Cognitive impairment    COPD, moderate (HCC)    Edema of both lower extremities    takes lasix and uses teds   First degree heart block    GERD (gastroesophageal reflux disease)    Heart murmur    History of cardiovascular stress test    Lexiscan Myoview 7/16:  EF 51%, inferior, inferolateral ischemia; Intermediate Risk   History of smallpox    HOH (hard of hearing)    even with hearing aids   Hypertension    Ischemic heart disease 2005   Dr Tresa Endo a. s/p CABG;  b.  LHC 05/23/15:  S-RPAVB ok, S-D2 ok, S-OM2 99% (s/p Synergy DES), L-LAD ok, EF  normal (complicated by pseudoaneurysm)    LBBB (left bundle branch block)    Right arm weakness    S/P CABG x 4    01-08-2004   S/P drug eluting coronary stent placement    05-21-2015  PCI and DES x1 to SVG--OM graft   Thalassemia minor    Type 2 diabetes, diet controlled (HCC)    Wears glasses    Wears hearing aid in both ears     Past Surgical History:  Procedure Laterality Date   APPENDECTOMY     CARDIAC CATHETERIZATION  1989   in [place]   normal     CARDIAC CATHETERIZATION N/A 05/23/2015   Procedure: Left Heart Cath and Cors/Grafts Angiography;  Surgeon: Lennette Bihari, MD;  Location: [facility] INVASIVE CV LAB;  Service: Cardiovascular;  Laterality: N/A;   CARDIAC CATHETERIZATION N/A 05/23/2015   Procedure: Coronary Stent Intervention;  Surgeon: Lennette Bihari, MD;  Location: [facility] INVASIVE CV LAB;  Service: Cardiovascular;  Laterality: N/A;   CARDIAC CATHETERIZATION  01-04-2004   dr Melburn Popper  @[facility]    severe 3V CAD, normal LVF   CATARACT EXTRACTION W/ INTRAOCULAR LENS  IMPLANT, BILATERAL     CORONARY ARTERY BYPASS GRAFT  01-08-2004  dr hendrickson @[facility]    LIMA to LAD,  SVG  to D1,  SVG to OM,  SVG to PDA   FOOT SURGERY Right    INGUINAL HERNIA REPAIR Bilateral 1978   INGUINAL HERNIA REPAIR Bilateral 05/12/2023   Procedure: HERNIA REPAIR INGUINAL ADULT;  Surgeon: Griselda Miner, MD;  Location: [facility] OR;  Service: General;  Laterality: Bilateral;   INSERTION OF MESH Bilateral 05/12/2023   Procedure: INSERTION OF MESH;  Surgeon: Griselda Miner, MD;  Location: [facility] OR;  Service: General;  Laterality: Bilateral;   MASS EXCISION Left 11/20/2019   Procedure: EXCISION OF SEBACEOUS CYST CHEST;  Surgeon: Sheliah Hatch De Blanch, MD;  Location: [facility];  Service: General;  Laterality: Left;     Home Medications:  Prior to Admission medications   Medication Sig Start Date End Date Taking? Authorizing Provider  acetaminophen (TYLENOL) 500 MG tablet Take 1 tablet (500 mg total) by mouth  every 8 (eight) hours as needed for moderate pain. 05/17/23 05/16/24  Leroy Sea, MD  albuterol (PROVENTIL HFA;VENTOLIN HFA) 108 (90 Base) MCG/ACT inhaler Inhale 2 puffs into the lungs every 4 (four) hours as needed for wheezing or shortness of breath. 01/03/16   Charm Rings, MD  apixaban Everlene Balls) 2.5 MG TABS tablet Take 1 tablet by mouth twice daily 05/25/23   Lennette Bihari, MD  apixaban (ELIQUIS) 2.5 MG TABS tablet Take 1 tablet (2.5 mg total) by mouth 2 (two) times daily. 08/17/23   Lennette Bihari, MD  bisacodyl (DULCOLAX) 10 MG suppository Place 1 suppository (10 mg total) rectally daily as needed for moderate constipation. 05/17/23   Leroy Sea, MD  Blood Glucose Monitoring Suppl (ONE TOUCH ULTRA 2) w/Device KIT daily in the afternoon. 11/02/22   [provider]  docusate sodium (COLACE) 100 MG capsule Take 1 capsule (100 mg total) by mouth 2 (two) times daily 05/17/23   Leroy Sea, MD  ferrous sulfate 325 (65 FE) MG tablet Take 1 tablet (325 mg total) by mouth 2 (two) times daily with a meal. Patient not taking: Reported on 07/05/2023 05/17/23   Leroy Sea, MD  finasteride (PROSCAR) 5 MG tablet Take 5 mg by mouth at bedtime.     [provider]  folic acid (FOLVITE) 1 MG tablet Take 1 tablet (1 mg total) by mouth daily. Patient not taking: Reported on 07/05/2023 05/17/23   Leroy Sea, MD  furosemide (LASIX) 20 MG tablet 20 MG EVERY 4 DAYS Patient taking differently: Take 20 mg by mouth See admin instructions. 20 MG EVERY 4 DAYS 03/06/23   Ronney Asters, NP  isosorbide mononitrate (IMDUR) 30 MG 24 hr tablet Take 1 tablet by mouth once daily 06/07/23   Ronney Asters, NP  ketorolac (ACULAR) 0.5 % ophthalmic solution Place 1 drop into the left eye 4 (four) times daily. Patient not taking: Reported on 07/05/2023    [provider]  losartan (COZAAR) 25 MG tablet Take 1 tablet by mouth once daily 06/29/23   Lennette Bihari, MD  metFORMIN  (GLUCOPHAGE) 500 MG tablet Take 500 mg by mouth 2 (two) times daily.    [provider]  mirabegron ER (MYRBETRIQ) 50 MG TB24 tablet Take 50 mg by mouth at bedtime.     [provider]  Multiple Vitamin (MULTIVITAMIN WITH MINERALS) TABS tablet Take 1 tablet by mouth daily.    [provider]  Multiple Vitamins-Minerals (PRESERVISION AREDS 2 PO) Take 1 tablet by mouth 2 (two) times daily.     [provider]  NITROSTAT 0.4 MG SL tablet DISSOLVE ONE TABLET UNDER THE TONGUE EVERY 5 MINUTES AS NEEDED FOR CHEST PAIN. Patient taking differently: Place 0.4 mg under the tongue every 5 (five) minutes as needed for chest pain. 01/13/22   Lennette Bihari, MD  ONE TOUCH ULTRA TEST test strip 1 each by Other route as needed (glucose monoring).  04/21/15   [provider]  pantoprazole (PROTONIX) 40 MG tablet Take 1 tablet (40 mg total) by mouth daily. Patient taking differently: Take 40 mg by mouth daily. 05/21/15   Tereso Newcomer T, PA-C  polyethylene glycol powder (GLYCOLAX/MIRALAX) 17 GM/SCOOP powder Mix 17 g (1 capful) as directed and take by mouth daily as needed. 05/17/23   Leroy Sea, MD  pravastatin (PRAVACHOL) 80 MG tablet Take 80 mg by mouth at bedtime.    [provider]  prednisoLONE acetate (PRED FORTE) 1 % ophthalmic suspension Place 1 drop into the left eye 4 (four) times daily. Patient not taking: Reported on 07/05/2023    [provider]  predniSONE (DELTASONE) 10 MG tablet Take 1 tablet by mouth daily.    [provider]    Inpatient Medications: Scheduled Meds:  Solution-Plus Stopcock   Does not apply Once   Continuous Infusions:  DOPamine     PRN Meds:   Allergies:    Allergies  Allergen Reactions   Aceon [Perindopril Erbumine] Shortness Of Breath and Cough   Sulfa Antibiotics Swelling   Pork-Derived Products     muslim    Social History:   Social History   Socioeconomic History   Marital status:  Married    Spouse name: Not on file   Number of children: 2 d   Years of education: Not on file   Highest education level: Not on file  Occupational History   Occupation: accountant    Comment: [employer]  Tobacco Use   Smoking status: Never   Smokeless tobacco: Never  Vaping Use   Vaping status: Never Used  Substance and Sexual Activity   Alcohol use: No    Alcohol/week: 0.0 standard drinks of alcohol   Drug use: No   Sexual activity: Not on file  Other Topics Concern   Not on file  Social History Narrative   Not on file   Social Determinants of Health   Financial Resource Strain: Not on file  Food Insecurity: No Food Insecurity (05/10/2023)   Hunger Vital Sign    Worried About Running Out of Food in the Last Year: Never true    Ran Out of Food in the Last Year: Never true  Transportation Needs: No Transportation Needs (05/10/2023)   PRAPARE - Administrator, Civil Service (Medical): No    Lack of Transportation (Non-Medical): No  Physical Activity: Not on file  Stress: Not on file  Social Connections: Not on file  Intimate Partner Violence: Not At Risk (05/10/2023)   Humiliation, Afraid, Rape, and Kick questionnaire    Fear of Current or Ex-Partner: No    Emotionally Abused: No    Physically Abused: No    Sexually Abused: No    Family History:    Family History  Problem Relation Age of Onset   Healthy Father    Ulcers Mother    Stroke Mother      ROS:  Please see the history of present illness.   All other ROS reviewed and negative.     Physical Exam/Data:   Vitals:   08/29/23 1215  08/29/23 1228 08/29/23 1300  BP: (!) 170/144    Pulse: 64    Resp: (!) 29    Temp:  97.7 F (36.5 C)   TempSrc:  Rectal   SpO2: 94%    Weight:   47 kg   No intake or output data in the 24 hours ending 08/29/23 1327    08/29/2023    1:00 PM 07/05/2023    1:49 PM 05/11/2023    7:00 AM  Last 3 Weights  Weight (lbs) 103 lb 9.9 oz 107 lb 12.8 oz 108 lb 3.9 oz   Weight (kg) 47 kg 48.898 kg 49.1 kg     Body mass index is 18.35 kg/m.  General:  Well nourished, well developed, in no acute distress HEENT: normal Neck: no JVD Vascular: No carotid bruits; Distal pulses 2+ bilaterally Cardiac:  reg bradycardia Lungs:  scattered rales and rhonchi. Abd: soft scaphoid. Ext: no edema Musculoskeletal:  No deformities, BUE and BLE strength normal and equal Skin: warm and dry  Neuro:  CNs 2-12 intact, no focal abnormalities noted Psych:  Normal affect   EKG:  The EKG was personally reviewed and demonstrates:  atrial fib with a slow VR Telemetry:  Telemetry was personally reviewed and demonstrates:  atrial fib at 30/min  Relevant CV Studies: none  Laboratory Data:  High Sensitivity Troponin:   Recent Labs  Lab 08/29/23 1218  TROPONINIHS 20*     Chemistry Recent Labs  Lab 08/29/23 1218 08/29/23 1220  NA 139 141  K 4.3 4.3  CL 106 105  CO2 23  --   GLUCOSE 152* 148*  BUN 25* 32*  CREATININE 0.86 0.90  CALCIUM 8.6*  --   GFRNONAA >60  --   ANIONGAP 10  --     Recent Labs  Lab 08/29/23 1218  PROT 6.3*  ALBUMIN 3.6  AST 28  ALT 21  ALKPHOS 46  BILITOT 1.9*   Lipids No results for input(s): "CHOL", "TRIG", "HDL", "LABVLDL", "LDLCALC", "CHOLHDL" in the last 168 hours.  Hematology Recent Labs  Lab 08/29/23 1218 08/29/23 1220  WBC 6.8  --   RBC 5.60  --   HGB 10.9* 12.9*  HCT 36.1* 38.0*  MCV 64.5*  --   MCH 19.5*  --   MCHC 30.2  --   RDW 20.5*  --   PLT 157  --    Radiology/Studies:  No results found.   Assessment and Plan:   Complete heart block - his VR is around 30. I will add a little IV dopamine. He will need PPM after the eliquis has washed out, probably on Tuesday. If he gets slower or has long pauses, then a temp PM will be require in the interim. Chronic atrial fib - see above.  Chronic systolic heart failure - a left bundle area lead and possibly a CS lead will be planned for.   Signed, Lewayne Bunting,  MD  08/29/2023 1:27 PM

## 2023-08-29 NOTE — ED Notes (Signed)
Per NS nursing staff already in room

## 2023-08-30 ENCOUNTER — Ambulatory Visit: Payer: Medicare HMO | Admitting: Adult Health

## 2023-08-30 DIAGNOSIS — R001 Bradycardia, unspecified: Secondary | ICD-10-CM | POA: Diagnosis not present

## 2023-08-30 LAB — BASIC METABOLIC PANEL
Anion gap: 10 (ref 5–15)
BUN: 18 mg/dL (ref 8–23)
CO2: 25 mmol/L (ref 22–32)
Calcium: 8.9 mg/dL (ref 8.9–10.3)
Chloride: 105 mmol/L (ref 98–111)
Creatinine, Ser: 0.7 mg/dL (ref 0.61–1.24)
GFR, Estimated: >60 mL/min (ref >60)
Glucose, Bld: 130 mg/dL — ABNORMAL HIGH (ref 70–99)
Potassium: 3.7 mmol/L (ref 3.5–5.1)
Sodium: 140 mmol/L (ref 135–145)

## 2023-08-30 LAB — HEPATIC FUNCTION PANEL
ALT: 21 U/L (ref 0–44)
AST: 17 U/L (ref 15–41)
Albumin: 3.6 g/dL (ref 3.5–5.0)
Alkaline Phosphatase: 47 U/L (ref 38–126)
Bilirubin, Direct: 0.6 mg/dL — ABNORMAL HIGH (ref 0.0–0.2)
Indirect Bilirubin: 1.1 mg/dL — ABNORMAL HIGH (ref 0.3–0.9)
Total Bilirubin: 1.7 mg/dL — ABNORMAL HIGH (ref <1.2)
Total Protein: 6.2 g/dL — ABNORMAL LOW (ref 6.5–8.1)

## 2023-08-30 LAB — CBC
HCT: 38.5 % — ABNORMAL LOW (ref 39.0–52.0)
Hemoglobin: 11.9 g/dL — ABNORMAL LOW (ref 13.0–17.0)
MCH: 19.8 pg — ABNORMAL LOW (ref 26.0–34.0)
MCHC: 30.9 g/dL (ref 30.0–36.0)
MCV: 64 fL — ABNORMAL LOW (ref 80.0–100.0)
Platelets: 163 10*3/uL (ref 150–400)
RBC: 6.02 MIL/uL — ABNORMAL HIGH (ref 4.22–5.81)
RDW: 20.4 % — ABNORMAL HIGH (ref 11.5–15.5)
WBC: 8.8 10*3/uL (ref 4.0–10.5)
nRBC: 0.2 % (ref 0.0–0.2)

## 2023-08-30 LAB — GLUCOSE, CAPILLARY
Glucose-Capillary: 114 mg/dL — ABNORMAL HIGH (ref 70–99)
Glucose-Capillary: 157 mg/dL — ABNORMAL HIGH (ref 70–99)
Glucose-Capillary: 113 mg/dL — ABNORMAL HIGH (ref 70–99)
Glucose-Capillary: 145 mg/dL — ABNORMAL HIGH (ref 70–99)
Glucose-Capillary: 100 mg/dL — ABNORMAL HIGH (ref 70–99)

## 2023-08-30 LAB — MAGNESIUM: Magnesium: 1.9 mg/dL (ref 1.7–2.4)

## 2023-08-30 MED ORDER — PREDNISOLONE ACETATE 1 % OP SUSP
1.0000 [drp] | Freq: Four times a day (QID) | OPHTHALMIC | Status: DC
  Administered 2023-08-30 – 2023-09-02 (×14): 1 [drp] via OPHTHALMIC
  Filled 2023-08-30: qty 5

## 2023-08-30 MED ORDER — LIDOCAINE HCL URETHRAL/MUCOSAL 2 % EX GEL
1.0000 | Freq: Once | CUTANEOUS | Status: AC
  Administered 2023-08-30: 1
  Filled 2023-08-30: qty 6

## 2023-08-30 MED ORDER — SODIUM CHLORIDE 0.9% FLUSH: 3.0000 mL | INTRAVENOUS | Status: DC | PRN

## 2023-08-30 MED ORDER — KETOROLAC TROMETHAMINE 0.5 % OP SOLN
1.0000 [drp] | Freq: Four times a day (QID) | OPHTHALMIC | Status: DC
  Administered 2023-08-30 – 2023-09-02 (×14): 1 [drp] via OPHTHALMIC
  Filled 2023-08-30: qty 5

## 2023-08-30 MED ORDER — SODIUM CHLORIDE 0.9 % IV SOLN: INTRAVENOUS | Status: DC

## 2023-08-30 MED ORDER — CHLORHEXIDINE GLUCONATE CLOTH 2 % EX PADS
6.0000 | MEDICATED_PAD | CUTANEOUS | Status: AC
  Administered 2023-08-31: 6 via TOPICAL

## 2023-08-30 MED ORDER — SODIUM CHLORIDE 0.9 % IV SOLN
250.0000 mL | INTRAVENOUS | Status: DC
Start: 2023-08-31 — End: 2023-08-31

## 2023-08-30 MED ORDER — KETOROLAC TROMETHAMINE 0.5 % OP SOLN
1.0000 [drp] | Freq: Three times a day (TID) | OPHTHALMIC | Status: DC
  Filled 2023-08-30: qty 5

## 2023-08-30 MED ORDER — CEFAZOLIN SODIUM-DEXTROSE 2-4 GM/100ML-% IV SOLN: 2.0000 g | INTRAVENOUS | Status: AC

## 2023-08-30 MED ORDER — SODIUM CHLORIDE 0.9 % IV SOLN
80.0000 mg | INTRAVENOUS | Status: DC
  Filled 2023-08-30: qty 2

## 2023-08-30 MED ORDER — CEFAZOLIN SODIUM-DEXTROSE 2-4 GM/100ML-% IV SOLN: 2.0000 g | INTRAVENOUS | Status: DC

## 2023-08-30 MED ORDER — CHLORHEXIDINE GLUCONATE 4 % EX SOLN
60.0000 mL | Freq: Once | CUTANEOUS | Status: AC
  Administered 2023-08-31: 4 via TOPICAL
  Filled 2023-08-30: qty 15

## 2023-08-30 MED ORDER — SODIUM CHLORIDE 0.9 % IV SOLN: 250.0000 mL | INTRAVENOUS | Status: DC

## 2023-08-30 MED ORDER — CHLORHEXIDINE GLUCONATE CLOTH 2 % EX PADS
6.0000 | MEDICATED_PAD | Freq: Every day | CUTANEOUS | Status: DC
  Administered 2023-08-30: 6 via TOPICAL

## 2023-08-30 MED ORDER — CHLORHEXIDINE GLUCONATE 4 % EX SOLN
60.0000 mL | Freq: Once | CUTANEOUS | Status: AC
  Filled 2023-08-30: qty 45

## 2023-08-30 MED ORDER — PREDNISOLONE ACETATE 1 % OP SUSP
1.0000 [drp] | Freq: Three times a day (TID) | OPHTHALMIC | Status: DC
  Filled 2023-08-30: qty 5

## 2023-08-30 MED ORDER — SODIUM CHLORIDE 0.9% FLUSH
3.0000 mL | Freq: Two times a day (BID) | INTRAVENOUS | Status: DC
  Administered 2023-08-30: 3 mL via INTRAVENOUS

## 2023-08-30 NOTE — Progress Notes (Addendum)
  Patient Name: Dakota [name] Date of Encounter: 08/30/2023  Primary Cardiologist: Nicki Guadalajara, MD Electrophysiologist: None  Interval Summary   Pt is very [facility], daughter at bedside. Denies SOB, chest discomfort. Remains on dopamine at 12mcg/kg/min.    Vital Signs    Vitals:   08/30/23 0400 08/30/23 0415 08/30/23 0430 08/30/23 0445  BP: (!) 148/67 (!) 153/50 (!) 101/90 (!) 136/45  Pulse: (!) 32 (!) 31 (!) 32 (!) 36  Resp: 18 (!) 21 20 (!) 21  Temp:      TempSrc:      SpO2: 98% 99% 98% 99%  Weight:        Intake/Output Summary (Last 24 hours) at 08/30/2023 0636 Last data filed at 08/30/2023 0000 Gross per 24 hour  Intake 82.16 ml  Output 100 ml  Net -17.84 ml   Filed Weights   08/29/23 1300 08/29/23 1600  Weight: 47 kg 47 kg    Physical Exam    GEN- The patient is well appearing, alert and oriented x 3 today.   Lungs- Clear to ausculation bilaterally, normal work of breathing Cardiac- bradycardic in 30's, Regular rate and rhythm, no murmurs, rubs or gallops GI- soft, NT, ND, + BS Extremities- no clubbing or cyanosis. No edema  Telemetry    Tele: AF, ventricular rates in 30's, PVC's (personally reviewed)  Studies:  ECHO 04/2023 > LVEF 40-45%, significant dyssynchrony due to LBBB, severe calcification of aortic valve, AV appears slightly stenotic by visualization, limited doppler evaluation for AS, mitral valve is degenerative    Arrhythmia / AAD Permanent AF > on eliquis  CHB 08/2023    Device Pending    Hospital Course    Dakota [name] is a 89 y.o. male with PMH of permanent AF, LBBB, chronic systolic CHF, CAD s/p CABG x4, chronic peripheral edema & DM II who was admitted on 11/3 with several days of worsening fatigue, anorexia / poor intake, and failure to thrive.  Admitted with severe symptomatic bradycardia in the setting of CHB, perm AF. Started on dopamine infusion.   Assessment & Plan    Severe Symptomatic Bradycardia due to Complete Heart Block   LBBB -not on AV nodal blocking agents  -consider left bundle vs CS lead placement  -continue dopamine for now, BP stable, HR remains in 30's -if further symptoms(hypotension etc), would consider temp pacing wire   Permanent Atrial Fibrillation  -hold eliquis for planned procedure, last dose am 11/3  -device tentatively planned for 11/5  Chronic Systolic CHF  Chronic Peripheral Edema  -per primary   CAD s/p CABG -statin  For questions or updates, please contact [facility] Please consult [email or web address] for contact info under Cardiology/STEMI.  Signed, Canary Brim, MSN, APRN, NP-C, AGACNP-BC [facility] - Electrophysiology  08/30/2023, 6:36 AM  EP Attending   Patient seen and examined. Agree with the findings as noted above. He is improved on IV dopamine. His VR is 35/min. He is warmer and mentation is improved. His eliquis has been held. Plan for PPM tomorrow.   Sharlot Gowda Jadalynn Burr,MD

## 2023-08-30 NOTE — TOC Initial Note (Signed)
Transition of Care [facility]) - Initial/Assessment Note    Patient Details  Name: Dakota [name] MRN: [id number] Date of Birth: 1925/11/20  Transition of Care [facility]) CM/SW Contact:    Dakota Cousin, RN Phone Number: [phone] 08/30/2023, 5:30 PM  Clinical Narrative:   CM spoke to pt and dtr, Dakota [name] at bedside. Pt is active with [facility]. Sent message to rep, Dakota [name] to follow up. Pt has a Rollator at home. Dtr states friend and Benedetto Goad transports them to appts. Provided her information on Medicaid. Will continue to follow up with dc needs.                 Expected Discharge Plan: Home w Home Health Services Barriers to Discharge: Continued Medical Work up   Patient Goals and CMS Choice Patient states their goals for this hospitalization and ongoing recovery are:: wants patient to get better CMS Medicare.gov Compare Post Acute Care list provided to:: Patient Represenative (must comment) Choice offered to / list presented to : Adult Children      Expected Discharge Plan and Services   Discharge Planning Services: CM Consult Post Acute Care Choice: Home Health Living arrangements for the past 2 months: Apartment                           HH Arranged: RN HH Agency: [facility] Date [facility] Agency Contacted: 08/30/23 Time HH Agency Contacted: [id number] Representative spoke with at [facility] Agency: Lorenza Chick  Prior Living Arrangements/Services Living arrangements for the past 2 months: Apartment Lives with:: Adult Children Patient language and need for interpreter reviewed:: Yes Do you feel safe going back to the place where you live?: Yes      Need for Family Participation in Patient Care: Yes (Comment) Care giver support system in place?: Yes (comment) Current home services: DME (Rollator, cane, Rolling Walker)    Activities of Daily Living   ADL Screening (condition at time of admission) Independently performs ADLs?: No Does the patient have a NEW difficulty with  bathing/dressing/toileting/self-feeding that is expected to last >3 days?: Yes (Initiates electronic notice to provider for possible OT consult) Does the patient have a NEW difficulty with getting in/out of bed, walking, or climbing stairs that is expected to last >3 days?: Yes (Initiates electronic notice to provider for possible PT consult) Does the patient have a NEW difficulty with communication that is expected to last >3 days?: Yes (Initiates electronic notice to provider for possible SLP consult) Is the patient deaf or have difficulty hearing?: Yes Does the patient have difficulty seeing, even when wearing glasses/contacts?: No Does the patient have difficulty concentrating, remembering, or making decisions?: Yes  Permission Sought/Granted Permission sought to share information with : Case Manager, Family Supports, PCP Permission granted to share information with : Yes, Verbal Permission Granted  Share Information with NAME: Tabias Swayze  Permission granted to share info w AGENCY: Home Health, DME  Permission granted to share info w Relationship: daughter  Permission granted to share info w Contact Information: [phone]  Emotional Assessment Appearance:: Appears stated age Attitude/Demeanor/Rapport: Engaged Affect (typically observed): Accepting Orientation: : Oriented to Self, Oriented to Place, Oriented to  Time, Oriented to Situation   Psych Involvement: No (comment)  Admission diagnosis:  Atrial fibrillation with slow ventricular response (HCC) [I48.91] Symptomatic bradycardia [R00.1] Generalized weakness [R53.1] Patient Active Problem List   Diagnosis Date Noted   Symptomatic bradycardia 08/29/2023   SBO (small bowel obstruction) (  HCC) 05/10/2023   Inguinal hernia of right side with obstruction 05/10/2023   Cardiac arrhythmia 11/26/2019   Chest pain 04/10/2018   Nonspecific chest pain    Essential hypertension 09/07/2016   COPD, moderate (HCC) 06/13/2015   CAD  (coronary artery disease) of bypass graft 05/23/2015   Coronary artery disease involving native coronary artery of native heart with angina pectoris (HCC)    Abnormal nuclear stress test    Ischemic chest pain (HCC)    Cough 09/26/2012   Malaise and fatigue 04/11/2012   Hx of CABG 04/03/2011   Left bundle branch block 04/03/2011   Diabetes (HCC) 04/03/2011   Hypercholesterolemia 04/03/2011   Chronic systolic CHF (congestive heart failure) (HCC) 04/03/2011   PCP:  [name], MD Pharmacy:   [facility] [address], [place] - [address] [place] [address] Phone: [phone] Fax: [phone]  [facility] [address] [place] [address] Phone: [phone] Fax: [phone]     Social Determinants of Health (SDOH) Social History: SDOH Screenings   Food Insecurity: No Food Insecurity (05/10/2023)  Housing: Low Risk  (08/29/2023)  Transportation Needs: No Transportation Needs (08/29/2023)  Utilities: Not At Risk (05/10/2023)  Tobacco Use: Low Risk  (08/29/2023)   SDOH Interventions:     Readmission Risk Interventions     No data to display

## 2023-08-30 NOTE — Progress Notes (Signed)
eLink Physician-Brief Progress Note Patient Name: Dakota [name] DOB: September 16, 1926 MRN: [id number]   Date of Service  08/30/2023  HPI/Events of Note  Patient and daughter is requesting his eye gtts to be restarted. Acular and prednisolne acetate.  Also, HTN 160's and 170's at times. History of HTN but here for CHB.   eICU Interventions  Eye drops resumed as requested but will need clarification of indication BP now 124/49, no intervention needed Discussed with bedside RN     Intervention Category Intermediate Interventions: Other:;Hypertension - evaluation and management  Darl Pikes 08/30/2023, 12:29 AM

## 2023-08-30 NOTE — Progress Notes (Addendum)
NAME:  Dakota WELCHER, MRN:  [id number], DOB:  [birth year], LOS: 1 ADMISSION DATE:  08/29/2023, CONSULTATION DATE: 08/29/2023 REFERRING MD:  Cathren Laine , CHIEF COMPLAINT: Generalized weakness, nausea  History of Present Illness:  89 year old male with coronary artery disease status post CABG, chronic combined systolic and diastolic heart failure, hypertension, diabetes type 2 and BPH who was brought into the emergency department by his daughter with a complaint of generalized weakness associated with nausea for past few days.  As per patient's daughter he was in usual state of health until June of this year, in July he had bilateral inguinal hernia surgery since then he has been feeling weak and not that active.  For the past 2 days he has been feeling generalized weak, not getting out of bed, not been able to eat or drink.  He was brought in the emergency department, upon arrival his heart rate was noted to be in the low 30s, EKG was done which is consistent with complete heart block.  Patient was seen by cardiology, he was started on dopamine without much improvement in heart rate though his blood pressure remained stable.  Pertinent  Medical History    has a past medical history of Aortic atherosclerosis (HCC), Arthritis, Benign localized prostatic hyperplasia with lower urinary tract symptoms (LUTS), Chronic chest pain, Chronic combined systolic (congestive) and diastolic (congestive) heart failure (HCC), Chronic dyspnea, Cognitive impairment, COPD, moderate (HCC), Edema of both lower extremities, First degree heart block, GERD (gastroesophageal reflux disease), Heart murmur, History of cardiovascular stress test, History of smallpox, HOH (hard of hearing), Hypertension, Ischemic heart disease (2005), LBBB (left bundle branch block), Right arm weakness, S/P CABG x 4, S/P drug eluting coronary stent placement, Thalassemia minor, Type 2 diabetes, diet controlled (HCC), Wears glasses, and Wears hearing aid  in both ears.  Significant Hospital Events: Including procedures, antibiotic start and stop dates in addition to other pertinent events   11/3 admitted with symptomatic bradycardia. Plans for PPM following AC washout.   Interim History / Subjective:  No acute events overnight No complains this AM Remains bradycardic. Not much improvement with dopamine  Objective   Blood pressure (!) 160/51, pulse (!) 32, temperature (!) 97.5 F (36.4 C), temperature source Axillary, resp. rate (!) 23, weight 44.6 kg, SpO2 93%.        Intake/Output Summary (Last 24 hours) at 08/30/2023 0741 Last data filed at 08/30/2023 0700 Gross per 24 hour  Intake 113.02 ml  Output 100 ml  Net 13.02 ml   Filed Weights   08/29/23 1300 08/29/23 1600 08/30/23 0500  Weight: 47 kg 47 kg 44.6 kg    Examination: General:  frail elderly appearing male in NAD Neuro:  Awake, alert, cooperates with exam.  HEENT:  [place]/AT, No JVD noted, bilateral cataract Cardiovascular:  Bradycardic, regular, no MRG. No edema.  Lungs:  Clear bilateral breath sounds Abdomen:  Soft, non-distended, non-tender Musculoskeletal:  No acute deformity Skin:  Intact, MMM   Resolved Hospital Problem list     Assessment & Plan:   Severe symptomatic bradycardia in the setting of complete heart block - Continue dopamine infusion, however, low threshold to discontinue if needed as it has had only  marginal effect.  - Appreciate EP  - Plans for PPM after Eliquis washout tentatively for 11/5. Last Eliquis dose 11/3 AM - Will pursue temp pacer if he should worsen.   Coronary artery disease status post CABG Chronic combined systolic and diastolic heart failure Hypertension - holding home  losartan, Imdur, lasix for now  Chronic A-fib - Eliquis on hold pending PPM - Telemetry monitoring  Diabetes type 2 - CBG monitoring and SSI  Anemia of chronic disease - trend hemoglobin > currently above recent baseline.   Best Practice (right  click and "Reselect all SmartList Selections" daily)   Diet/type: clear liquids DVT prophylaxis: SCD GI prophylaxis: N/A Lines: N/A Foley:  N/A Code Status:  full code Last date of multidisciplinary goals of care discussion [11/3: Patient's daughter was updated at bedside, decision was to continue full scope of care]  Labs   CBC: Recent Labs  Lab 08/29/23 1218 08/29/23 1220  WBC 6.8  --   HGB 10.9* 12.9*  HCT 36.1* 38.0*  MCV 64.5*  --   PLT 157  --     Basic Metabolic Panel: Recent Labs  Lab 08/29/23 1218 08/29/23 1220 08/30/23 0605  NA 139 141 140  K 4.3 4.3 3.7  CL 106 105 105  CO2 23  --  25  GLUCOSE 152* 148* 130*  BUN 25* 32* 18  CREATININE 0.86 0.90 0.70  CALCIUM 8.6*  --  8.9  MG 2.0  --  1.9   GFR: Estimated Creatinine Clearance: 34.8 mL/min (by C-G formula based on SCr of 0.7 mg/dL). Recent Labs  Lab 08/29/23 1218  WBC 6.8    Liver Function Tests: Recent Labs  Lab 08/29/23 1218 08/30/23 0605  AST 28 17  ALT 21 21  ALKPHOS 46 47  BILITOT 1.9* 1.7*  PROT 6.3* 6.2*  ALBUMIN 3.6 3.6   No results for input(s): "LIPASE", "AMYLASE" in the last 168 hours. No results for input(s): "AMMONIA" in the last 168 hours.  ABG    Component Value Date/Time   TCO2 26 08/29/2023 1220     Coagulation Profile: Recent Labs  Lab 08/29/23 1218  INR 1.4*    Cardiac Enzymes: No results for input(s): "CKTOTAL", "CKMB", "CKMBINDEX", "TROPONINI" in the last 168 hours.  HbA1C: Hgb A1c MFr Bld  Date/Time Value Ref Range Status  05/10/2023 04:34 PM 6.0 (H) 4.8 - 5.6 % Final    Comment:    (NOTE) Pre diabetes:          5.7%-6.4%  Diabetes:              >6.4%  Glycemic control for   <7.0% adults with diabetes   09/07/2016 10:27 AM 6.0 (H) <5.7 % Final    Comment:      For someone without known diabetes, a hemoglobin A1c value between 5.7% and 6.4% is consistent with prediabetes and should be confirmed with a follow-up test.   For someone with  known diabetes, a value <7% indicates that their diabetes is well controlled. A1c targets should be individualized based on duration of diabetes, age, co-morbid conditions and other considerations.   This assay result is consistent with an increased risk of diabetes.   Currently, no consensus exists regarding use of hemoglobin A1c for diagnosis of diabetes in children.       CBG: Recent Labs  Lab 08/29/23 1706 08/29/23 2005 08/29/23 2345 08/30/23 0400  GLUCAP 166* 185* 125* 114*    Review of Systems:   Denies chest pain, palpitation, headache, fever, chills or other complaints  Past Medical History:  He,  has a past medical history of Aortic atherosclerosis (HCC), Arthritis, Benign localized prostatic hyperplasia with lower urinary tract symptoms (LUTS), Chronic chest pain, Chronic combined systolic (congestive) and diastolic (congestive) heart failure (HCC), Chronic dyspnea, Cognitive impairment,  COPD, moderate (HCC), Edema of both lower extremities, First degree heart block, GERD (gastroesophageal reflux disease), Heart murmur, History of cardiovascular stress test, History of smallpox, HOH (hard of hearing), Hypertension, Ischemic heart disease (2005), LBBB (left bundle branch block), Right arm weakness, S/P CABG x 4, S/P drug eluting coronary stent placement, Thalassemia minor, Type 2 diabetes, diet controlled (HCC), Wears glasses, and Wears hearing aid in both ears.   Surgical History:   Past Surgical History:  Procedure Laterality Date   APPENDECTOMY     CARDIAC CATHETERIZATION  1989   in [place]   normal     CARDIAC CATHETERIZATION N/A 05/23/2015   Procedure: Left Heart Cath and Cors/Grafts Angiography;  Surgeon: Lennette Bihari, MD;  Location: [facility] INVASIVE CV LAB;  Service: Cardiovascular;  Laterality: N/A;   CARDIAC CATHETERIZATION N/A 05/23/2015   Procedure: Coronary Stent Intervention;  Surgeon: Lennette Bihari, MD;  Location: [facility] INVASIVE CV LAB;  Service:  Cardiovascular;  Laterality: N/A;   CARDIAC CATHETERIZATION  01-04-2004   dr Melburn Popper  @[facility]    severe 3V CAD, normal LVF   CATARACT EXTRACTION W/ INTRAOCULAR LENS  IMPLANT, BILATERAL     CORONARY ARTERY BYPASS GRAFT  01-08-2004  dr hendrickson @[facility]    LIMA to LAD,  SVG to D1,  SVG to OM,  SVG to PDA   FOOT SURGERY Right    INGUINAL HERNIA REPAIR Bilateral 1978   INGUINAL HERNIA REPAIR Bilateral 05/12/2023   Procedure: HERNIA REPAIR INGUINAL ADULT;  Surgeon: Griselda Miner, MD;  Location: [facility] OR;  Service: General;  Laterality: Bilateral;   INSERTION OF MESH Bilateral 05/12/2023   Procedure: INSERTION OF MESH;  Surgeon: Griselda Miner, MD;  Location: [facility] OR;  Service: General;  Laterality: Bilateral;   MASS EXCISION Left 11/20/2019   Procedure: EXCISION OF SEBACEOUS CYST CHEST;  Surgeon: Sheliah Hatch De Blanch, MD;  Location: [facility];  Service: General;  Laterality: Left;     Social History:   reports that he has never smoked. He has never used smokeless tobacco. He reports that he does not drink alcohol and does not use drugs.   Family History:  His family history includes Healthy in his father; Stroke in his mother; Ulcers in his mother.   Allergies Allergies  Allergen Reactions   Aceon [Perindopril Erbumine] Shortness Of Breath and Cough   Sulfa Antibiotics Swelling   Pork-Derived Products     muslim     Home Medications  Prior to Admission medications   Medication Sig Start Date End Date Taking? Authorizing Provider  acetaminophen (TYLENOL) 500 MG tablet Take 1 tablet (500 mg total) by mouth every 8 (eight) hours as needed for moderate pain. 05/17/23 05/16/24 Yes Leroy Sea, MD  albuterol (PROVENTIL HFA;VENTOLIN HFA) 108 (90 Base) MCG/ACT inhaler Inhale 2 puffs into the lungs every 4 (four) hours as needed for wheezing or shortness of breath. 01/03/16  Yes Charm Rings, MD  apixaban Everlene Balls) 2.5 MG TABS tablet Take 1 tablet by mouth twice daily 05/25/23  Yes Lennette Bihari, MD  docusate sodium (COLACE) 100 MG capsule Take 1 capsule (100 mg total) by mouth 2 (two) times daily 05/17/23  Yes Leroy Sea, MD  finasteride (PROSCAR) 5 MG tablet Take 5 mg by mouth at bedtime.    Yes [provider]  furosemide (LASIX) 20 MG tablet 20 MG EVERY 4 DAYS Patient taking differently: Take 20 mg by mouth See admin instructions. 20 MG EVERY 4 DAYS 03/06/23  Yes Ronney Asters, NP  isosorbide mononitrate (IMDUR) 30 MG 24 hr tablet Take 1 tablet by mouth once daily 06/07/23  Yes Cleaver, Thomasene Ripple, NP  ketorolac (ACULAR) 0.5 % ophthalmic solution Place 1 drop into the left eye 4 (four) times daily.   Yes [provider]  losartan (COZAAR) 25 MG tablet Take 1 tablet by mouth once daily 06/29/23  Yes Lennette Bihari, MD  metFORMIN (GLUCOPHAGE) 500 MG tablet Take 500 mg by mouth 2 (two) times daily.   Yes [provider]  mirabegron ER (MYRBETRIQ) 50 MG TB24 tablet Take 50 mg by mouth at bedtime.    Yes [provider]  Multiple Vitamin (MULTIVITAMIN WITH MINERALS) TABS tablet Take 1 tablet by mouth daily.   Yes [provider]  Multiple Vitamins-Minerals (PRESERVISION AREDS 2 PO) Take 1 tablet by mouth 2 (two) times daily.    Yes [provider]  NITROSTAT 0.4 MG SL tablet DISSOLVE ONE TABLET UNDER THE TONGUE EVERY 5 MINUTES AS NEEDED FOR CHEST PAIN. Patient taking differently: Place 0.4 mg under the tongue every 5 (five) minutes as needed for chest pain. 01/13/22  Yes Lennette Bihari, MD  pantoprazole (PROTONIX) 40 MG tablet Take 1 tablet (40 mg total) by mouth daily. Patient taking differently: Take 40 mg by mouth daily. 05/21/15  Yes Weaver, Scott T, PA-C  pravastatin (PRAVACHOL) 80 MG tablet Take 80 mg by mouth at bedtime.   Yes [provider]  prednisoLONE acetate (PRED FORTE) 1 % ophthalmic suspension Place 1 drop into the left eye 4 (four) times daily.   Yes [provider]  predniSONE (DELTASONE) 10  MG tablet Take 1 tablet by mouth daily.   Yes [provider]  Blood Glucose Monitoring Suppl (ONE TOUCH ULTRA 2) w/Device KIT daily in the afternoon. 11/02/22   [provider]  ONE TOUCH ULTRA TEST test strip 1 each by Other route as needed (glucose monoring).  04/21/15   [provider]     Critical care time: 37 minutes     Joneen Roach, AGACNP-BC [facility]  See Amion for personal pager PCCM on call pager [phone] until 7pm. Please call Elink 7p-7a. [phone]  08/30/2023 8:02 AM   PCCM:  I agree with Joneen Roach, NP documentation.   Patient is stable on dopamine. Awaiting eliquis to wash out and planned PMP by EP tomorrow.   Josephine Igo, DO [facility] 08/30/2023 10:08 AM

## 2023-08-31 ENCOUNTER — Other Ambulatory Visit: Payer: Self-pay

## 2023-08-31 ENCOUNTER — Encounter (HOSPITAL_COMMUNITY)
Admission: EM | Disposition: A | Discharge status: Home / Self Care | Payer: Self-pay | Source: Home / Self Care | Attending: Pulmonary Disease

## 2023-08-31 DIAGNOSIS — I4891 Unspecified atrial fibrillation: Principal | ICD-10-CM

## 2023-08-31 DIAGNOSIS — I442 Atrioventricular block, complete: Secondary | ICD-10-CM | POA: Diagnosis not present

## 2023-08-31 DIAGNOSIS — R001 Bradycardia, unspecified: Secondary | ICD-10-CM | POA: Diagnosis not present

## 2023-08-31 HISTORY — PX: PACEMAKER IMPLANT: EP1218

## 2023-08-31 LAB — BASIC METABOLIC PANEL
Anion gap: 9 (ref 5–15)
BUN: 14 mg/dL (ref 8–23)
CO2: 24 mmol/L (ref 22–32)
Calcium: 8.5 mg/dL — ABNORMAL LOW (ref 8.9–10.3)
Chloride: 104 mmol/L (ref 98–111)
Creatinine, Ser: 0.62 mg/dL (ref 0.61–1.24)
GFR, Estimated: >60 mL/min (ref >60)
Glucose, Bld: 105 mg/dL — ABNORMAL HIGH (ref 70–99)
Potassium: 3.8 mmol/L (ref 3.5–5.1)
Sodium: 137 mmol/L (ref 135–145)

## 2023-08-31 LAB — PHOSPHORUS: Phosphorus: 2.8 mg/dL (ref 2.5–4.6)

## 2023-08-31 LAB — URINALYSIS, ROUTINE W REFLEX MICROSCOPIC
Bilirubin Urine: NEGATIVE
Glucose, UA: NEGATIVE mg/dL
Ketones, ur: 5 mg/dL — AB
Nitrite: NEGATIVE
Protein, ur: NEGATIVE mg/dL
RBC / HPF: >50 RBC/hpf (ref 0–5)
Specific Gravity, Urine: 1.023 (ref 1.005–1.030)
pH: 5 (ref 5.0–8.0)

## 2023-08-31 LAB — CBC
HCT: 36.5 % — ABNORMAL LOW (ref 39.0–52.0)
Hemoglobin: 11.2 g/dL — ABNORMAL LOW (ref 13.0–17.0)
MCH: 19.2 pg — ABNORMAL LOW (ref 26.0–34.0)
MCHC: 30.7 g/dL (ref 30.0–36.0)
MCV: 62.7 fL — ABNORMAL LOW (ref 80.0–100.0)
Platelets: 160 10*3/uL (ref 150–400)
RBC: 5.82 MIL/uL — ABNORMAL HIGH (ref 4.22–5.81)
RDW: 20.2 % — ABNORMAL HIGH (ref 11.5–15.5)
WBC: 8.9 10*3/uL (ref 4.0–10.5)
nRBC: 0 % (ref 0.0–0.2)

## 2023-08-31 LAB — GLUCOSE, CAPILLARY
Glucose-Capillary: 128 mg/dL — ABNORMAL HIGH (ref 70–99)
Glucose-Capillary: 75 mg/dL (ref 70–99)
Glucose-Capillary: 117 mg/dL — ABNORMAL HIGH (ref 70–99)
Glucose-Capillary: 114 mg/dL — ABNORMAL HIGH (ref 70–99)

## 2023-08-31 LAB — SURGICAL PCR SCREEN
MRSA, PCR: NEGATIVE
Staphylococcus aureus: NEGATIVE

## 2023-08-31 LAB — MAGNESIUM
Magnesium: 1.6 mg/dL — ABNORMAL LOW (ref 1.7–2.4)
Magnesium: 2.5 mg/dL — ABNORMAL HIGH (ref 1.7–2.4)

## 2023-08-31 SURGERY — PACEMAKER IMPLANT

## 2023-08-31 MED ORDER — IOHEXOL 350 MG/ML SOLN
INTRAVENOUS | Status: DC | PRN
  Administered 2023-08-31: 10 mL

## 2023-08-31 MED ORDER — LIDOCAINE HCL (PF) 1 % IJ SOLN
INTRAMUSCULAR | Status: AC
Start: 2023-08-31 — End: ?
  Filled 2023-08-31: qty 60

## 2023-08-31 MED ORDER — CHLORHEXIDINE GLUCONATE 4 % EX SOLN
CUTANEOUS | Status: AC
  Filled 2023-08-31: qty 15

## 2023-08-31 MED ORDER — ACETAMINOPHEN 325 MG PO TABS
325.0000 mg | ORAL_TABLET | ORAL | Status: DC | PRN
  Administered 2023-08-31 – 2023-09-02 (×2): 325 mg via ORAL
  Filled 2023-08-31: qty 1
  Filled 2023-08-31: qty 2

## 2023-08-31 MED ORDER — SODIUM CHLORIDE 0.9 % IV SOLN
80.0000 mg | INTRAVENOUS | Status: AC
  Filled 2023-08-31: qty 2

## 2023-08-31 MED ORDER — CEFAZOLIN SODIUM-DEXTROSE 2-4 GM/100ML-% IV SOLN
INTRAVENOUS | Status: AC
  Administered 2023-08-31: 2 g via INTRAVENOUS
  Filled 2023-08-31: qty 100

## 2023-08-31 MED ORDER — MAGNESIUM SULFATE 4 GM/100ML IV SOLN: 4.0000 g | Freq: Once | INTRAVENOUS | Status: DC

## 2023-08-31 MED ORDER — LIDOCAINE HCL (PF) 1 % IJ SOLN
INTRAMUSCULAR | Status: DC | PRN
  Administered 2023-08-31: 60 mL

## 2023-08-31 MED ORDER — CEFAZOLIN SODIUM-DEXTROSE 1-4 GM/50ML-% IV SOLN
1.0000 g | Freq: Four times a day (QID) | INTRAVENOUS | Status: AC
  Administered 2023-08-31 – 2023-09-01 (×3): 1 g via INTRAVENOUS
  Filled 2023-08-31 (×3): qty 50

## 2023-08-31 MED ORDER — MUPIROCIN 2 % EX OINT
1.0000 | TOPICAL_OINTMENT | Freq: Two times a day (BID) | CUTANEOUS | Status: DC
  Administered 2023-08-31: 1 via NASAL
  Filled 2023-08-31: qty 22

## 2023-08-31 MED ORDER — MAGNESIUM SULFATE 4 GM/100ML IV SOLN
4.0000 g | Freq: Once | INTRAVENOUS | Status: AC
  Administered 2023-08-31: 4 g via INTRAVENOUS
  Filled 2023-08-31: qty 100

## 2023-08-31 MED ORDER — POTASSIUM CHLORIDE CRYS ER 20 MEQ PO TBCR: 20.0000 meq | EXTENDED_RELEASE_TABLET | Freq: Once | ORAL | Status: DC

## 2023-08-31 MED ORDER — ONDANSETRON HCL 4 MG/2ML IJ SOLN: 4.0000 mg | Freq: Four times a day (QID) | INTRAMUSCULAR | Status: DC | PRN

## 2023-08-31 MED ORDER — SODIUM CHLORIDE 0.9 % IV SOLN
INTRAVENOUS | Status: AC
  Administered 2023-08-31: 80 mg
  Filled 2023-08-31: qty 2

## 2023-08-31 MED ORDER — CHLORHEXIDINE GLUCONATE CLOTH 2 % EX PADS
6.0000 | MEDICATED_PAD | Freq: Every day | CUTANEOUS | Status: DC
  Administered 2023-08-31 – 2023-09-01 (×2): 6 via TOPICAL

## 2023-08-31 SURGICAL SUPPLY — 12 items
CABLE SURGICAL S-101-97-12 (CABLE) ×2 IMPLANT
CATH RIGHTSITE C315HIS02 (CATHETERS) IMPLANT
ELECT DEFIB PAD ADLT CADENCE (PAD) IMPLANT
IPG PACE AZUR XT SR MRI W1SR01 (Pacemaker) IMPLANT
KIT MICROPUNCTURE NIT STIFF (SHEATH) IMPLANT
LEAD SELECT SECURE 3830 383069 (Lead) IMPLANT
PACE AZURE XT SR MRI W1SR01 (Pacemaker) ×1 IMPLANT
SELECT SECURE 3830 383069 (Lead) ×1 IMPLANT
SHEATH 7FR PRELUDE SNAP 13 (SHEATH) IMPLANT
SLITTER 6232ADJ (MISCELLANEOUS) IMPLANT
TRAY PACEMAKER INSERTION (PACKS) ×2 IMPLANT
WIRE HITORQ VERSACORE ST 145CM (WIRE) IMPLANT

## 2023-08-31 NOTE — Progress Notes (Addendum)
  Patient Name: Dakota [name] Date of Encounter: 08/31/2023  Primary Cardiologist: Nicki Guadalajara, MD Electrophysiologist: None  Interval Summary   No acute issues overnight. Remains on dopamine. Pending PPM this am.   At this time, the patient denies chest pain, shortness of breath, or any new concerns.  Vital Signs    Vitals:   08/31/23 0545 08/31/23 0600 08/31/23 0615 08/31/23 0630  BP: (!) 121/40 (!) 126/40 (!) 112/37 (!) 128/38  Pulse: (!) 31 (!) 30 (!) 30 (!) 30  Resp: 19 (!) 21 11 10   Temp:      TempSrc:      SpO2: 98% 98% 98% 100%  Weight:      Height:        Intake/Output Summary (Last 24 hours) at 08/31/2023 0648 Last data filed at 08/31/2023 0641 Gross per 24 hour  Intake 173.64 ml  Output 1005 ml  Net -831.36 ml   Filed Weights   08/29/23 1600 08/30/23 0500 08/31/23 0500  Weight: 47 kg 44.6 kg 43.1 kg    Physical Exam    GEN- The patient is well appearing, alert and oriented x 3 today.   Lungs- Clear to ausculation bilaterally, normal work of breathing Cardiac- Regular rate and rhythm, no murmurs, rubs or gallops GI- soft, NT, ND, + BS Extremities- no clubbing or cyanosis. No edema  Telemetry    AF, CHB in 30-40's (personally reviewed)  Hospital Course    Dakota [name] is a 89 y.o. male with PMH of permanent AF, LBBB, chronic systolic CHF, CAD s/p CABG x4, chronic peripheral edema & DM II who was admitted on 11/3 with several days of worsening fatigue, anorexia / poor intake, and failure to thrive.  Admitted with severe symptomatic bradycardia in the setting of CHB, perm AF. Started on dopamine infusion. For PPM on 08/31/23.    Assessment & Plan    Severe Symptomatic Bradycardia due to Complete Heart Block  LBBB -no AV nodal blocking agents prior to admit  -pending PPM implant, possible LB pacing vs BiV -continue dopamine infusion with plan to wean off post implant    Permanent Atrial Fibrillation  -hold eliquis, last dose am 11/3  -hold post  implant    Chronic Systolic CHF  Chronic Peripheral Edema  -per primary     CAD s/p CABG -statin     For questions or updates, please contact [facility] Please consult [email or web address] for contact info under Cardiology/STEMI.  Signed, Canary Brim, MSN, APRN, NP-C, AGACNP-BC [facility] - Electrophysiology  08/31/2023, 9:17 AM  EP Attending  Patient seen and examined. Agree with above. The patient remains stable with HR's in the 30-40 range with chronic atrial fib. We will plan to proceed with PPM insertion. I have answered all questions and he wishes to proceed.  Dakota Gowda Jaxsyn Catalfamo,MD

## 2023-08-31 NOTE — Progress Notes (Signed)
[facility] ADULT ICU REPLACEMENT PROTOCOL   The patient does apply for the [facility] Adult ICU Electrolyte Replacment Protocol based on the criteria listed below:   1.Exclusion criteria: TCTS, ECMO, Dialysis, and Myasthenia Gravis patients 2. Is GFR >/= 30 ml/min? Yes.    Patient's GFR today is >60 3. Is SCr </= 2? Yes.   Patient's SCr is 0.62 mg/dL 4. Did SCr increase >/= 0.5 in 24 hours? No. 5.Pt's weight >40kg  Yes.   6. Abnormal electrolyte(s): mag 1.6  7. Electrolytes replaced per protocol 8.  Call MD STAT for K+ </= 2.5, Phos </= 1, or Mag </= 1 Physician:  protocol  Melvern Banker 08/31/2023 4:01 AM

## 2023-08-31 NOTE — Progress Notes (Signed)
eLink Physician-Brief Progress Note Patient Name: Dakota [name] DOB: 1926/02/07 MRN: [id number]   Date of Service  08/31/2023  HPI/Events of Note  Presented with symptomatic bradycardia with anticipation of pacemaker.  MRSA nares negative.  Bactroban ordered for 5-day course for unclear reasons.  eICU Interventions  DC Bactroban in the setting of negative MRSA nares.     Intervention Category Minor Interventions: Routine modifications to care plan (e.g. PRN medications for pain, fever)  [name] 08/31/2023, 11:02 PM

## 2023-08-31 NOTE — Progress Notes (Signed)
NAME:  Dakota [name], MRN:  [id number], DOB:  27-Mar-1926, LOS: 2 ADMISSION DATE:  08/29/2023, CONSULTATION DATE: 08/29/2023 REFERRING MD:  Denton Lank - EDP CHIEF COMPLAINT: Generalized weakness, nausea  History of Present Illness:  89 year old male with coronary artery disease status post CABG, chronic combined systolic and diastolic heart failure, hypertension, diabetes type 2 and BPH who was brought into the emergency department by his daughter with a complaint of generalized weakness associated with nausea for past few days.  As per patient's daughter he was in usual state of health until June of this year, in July he had bilateral inguinal hernia surgery since then he has been feeling weak and not that active.  For the past 2 days he has been feeling generalized weak, not getting out of bed, not been able to eat or drink.  He was brought in the emergency department, upon arrival his heart rate was noted to be in the low 30s, EKG was done which is consistent with complete heart block.  Patient was seen by cardiology, he was started on dopamine without much improvement in heart rate though his blood pressure remained stable.  Pertinent Medical History:   Past Medical History:  Diagnosis Date   Aortic atherosclerosis (HCC)    Arthritis    Benign localized prostatic hyperplasia with lower urinary tract symptoms (LUTS)    Chronic chest pain    takes imdur   Chronic combined systolic (congestive) and diastolic (congestive) heart failure (HCC)    followed by cardiology   Chronic dyspnea    Cognitive impairment    COPD, moderate (HCC)    Edema of both lower extremities    takes lasix and uses teds   First degree heart block    GERD (gastroesophageal reflux disease)    Heart murmur    History of cardiovascular stress test    Lexiscan Myoview 7/16:  EF 51%, inferior, inferolateral ischemia; Intermediate Risk   History of smallpox    HOH (hard of hearing)    even with hearing aids   Hypertension     Ischemic heart disease 2005   Dr Tresa Endo a. s/p CABG;  b.  LHC 05/23/15:  S-RPAVB ok, S-D2 ok, S-OM2 99% (s/p Synergy DES), L-LAD ok, EF normal (complicated by pseudoaneurysm)    LBBB (left bundle branch block)    Right arm weakness    S/P CABG x 4    01-08-2004   S/P drug eluting coronary stent placement    05-21-2015  PCI and DES x1 to SVG--OM graft   Thalassemia minor    Type 2 diabetes, diet controlled (HCC)    Wears glasses    Wears hearing aid in both ears    Significant Hospital Events: Including procedures, antibiotic start and stop dates in addition to other pertinent events   11/3 - Admitted with symptomatic bradycardia. Plans for PPM following AC washout. Dopamine gtt 5/hr.  Interim History / Subjective:  No significant events overnight Remains brady in 30s on dopamine For PPM today ~0930  Objective:  Blood pressure (!) 111/38, pulse (!) 32, temperature 98.1 F (36.7 C), temperature source Oral, resp. rate 16, height 5\' 3"  (1.6 m), weight 43.1 kg, SpO2 100%.        Intake/Output Summary (Last 24 hours) at 08/31/2023 0900 Last data filed at 08/31/2023 0641 Gross per 24 hour  Intake 164.82 ml  Output 1005 ml  Net -840.18 ml   Filed Weights   08/29/23 1600 08/30/23 0500 08/31/23 0500  Weight:  47 kg 44.6 kg 43.1 kg   Physical Examination: General: Chronically ill-appearing elderly man in NAD. HEENT: [place]/AT, anicteric sclera, PERRL, moist mucous membranes. Neuro: Awake, oriented x 4. Responds to verbal stimuli. Following commands consistently. Moves all 4 extremities spontaneously. Generalized weakness. CV: Bradycardic, regular rhythm, no m/g/r. PULM: Breathing even and unlabored on RA. Lung fields CTAB. GI: Soft, nontender, nondistended. Extremities: No significant LE edema noted. Skin: Warm/dry, no rashes.  Resolved Hospital Problem List:     Assessment & Plan:   Severe symptomatic bradycardia in the setting of complete heart block - Appreciate EP  consultation/recommendations - PPM placement planned for today, 11/5 - Continue dopamine gtt for HR 30s - If worsening prior to definitive device placement, consider temp wire - Cardiac monitoring  Coronary artery disease status post CABG Chronic combined systolic and diastolic heart failure Hypertension - Holding home losartan, Imdur, Lasix for now - Continue statin  Chronic A-fib - Cardiac monitoring - Optimize electrolytes for K > 4, Mg > 2 - Eliquis for AC, HOLD in the setting of PPM placement 11/5  Diabetes type 2 - SSI - CBGs Q4H - Goal CBG 140-180  Anemia of chronic disease - Trend H&H - Monitor for signs of active bleeding - Transfuse for Hgb < 7.0 or hemodynamically significant bleeding  Best Practice (right click and "Reselect all SmartList Selections" daily)   Diet/type: clear liquids DVT prophylaxis: SCD GI prophylaxis: N/A Lines: N/A Foley:  N/A Code Status:  full code Last date of multidisciplinary goals of care discussion [11/3: Patient's daughter was updated at bedside, decision was to continue full scope of care]  Critical care time:   The patient is critically ill with multiple organ system failure and requires high complexity decision making for assessment and support, frequent evaluation and titration of therapies, advanced monitoring, review of radiographic studies and interpretation of complex data.   Critical Care Time devoted to patient care services, exclusive of separately billable procedures, described in this note is 34 minutes.  Tim Lair, PA-C [facility] 08/31/23 9:00 AM  Please see [email or web address] for pager details.  From 7A-7P if no response, please call [phone] After hours, please call ELink [phone]

## 2023-08-31 NOTE — Progress Notes (Signed)
SLP Cancellation Note  Patient Details Name: COURTNEY FENLON MRN: [id number] DOB: 01/17/1926   Cancelled treatment:       Reason Eval/Treat Not Completed: Other (comment) Orders received for speech-language cognitive evaluation. Patient is getting pacemaker placement today. SLP will attempt f/u next date.  Angela Nevin, MA, CCC-SLP Speech Therapy

## 2023-09-01 ENCOUNTER — Encounter (HOSPITAL_COMMUNITY): Payer: Self-pay | Admitting: Internal Medicine

## 2023-09-01 ENCOUNTER — Inpatient Hospital Stay (HOSPITAL_COMMUNITY): Payer: Medicare HMO

## 2023-09-01 DIAGNOSIS — R001 Bradycardia, unspecified: Secondary | ICD-10-CM | POA: Diagnosis not present

## 2023-09-01 DIAGNOSIS — I442 Atrioventricular block, complete: Secondary | ICD-10-CM | POA: Diagnosis not present

## 2023-09-01 LAB — CBC
HCT: 36 % — ABNORMAL LOW (ref 39.0–52.0)
Hemoglobin: 11 g/dL — ABNORMAL LOW (ref 13.0–17.0)
MCH: 19.2 pg — ABNORMAL LOW (ref 26.0–34.0)
MCHC: 30.6 g/dL (ref 30.0–36.0)
MCV: 62.8 fL — ABNORMAL LOW (ref 80.0–100.0)
Platelets: 145 10*3/uL — ABNORMAL LOW (ref 150–400)
RBC: 5.73 MIL/uL (ref 4.22–5.81)
RDW: 19.5 % — ABNORMAL HIGH (ref 11.5–15.5)
WBC: 7 10*3/uL (ref 4.0–10.5)
nRBC: 0 % (ref 0.0–0.2)

## 2023-09-01 LAB — BASIC METABOLIC PANEL
Anion gap: 8 (ref 5–15)
BUN: 13 mg/dL (ref 8–23)
CO2: 24 mmol/L (ref 22–32)
Calcium: 8 mg/dL — ABNORMAL LOW (ref 8.9–10.3)
Chloride: 103 mmol/L (ref 98–111)
Creatinine, Ser: 0.73 mg/dL (ref 0.61–1.24)
GFR, Estimated: >60 mL/min (ref >60)
Glucose, Bld: 99 mg/dL (ref 70–99)
Potassium: 3.8 mmol/L (ref 3.5–5.1)
Sodium: 135 mmol/L (ref 135–145)

## 2023-09-01 LAB — GLUCOSE, CAPILLARY
Glucose-Capillary: 120 mg/dL — ABNORMAL HIGH (ref 70–99)
Glucose-Capillary: 188 mg/dL — ABNORMAL HIGH (ref 70–99)
Glucose-Capillary: 78 mg/dL (ref 70–99)
Glucose-Capillary: 157 mg/dL — ABNORMAL HIGH (ref 70–99)

## 2023-09-01 LAB — MAGNESIUM: Magnesium: 2.1 mg/dL (ref 1.7–2.4)

## 2023-09-01 LAB — PHOSPHORUS: Phosphorus: 2.8 mg/dL (ref 2.5–4.6)

## 2023-09-01 MED ORDER — DOCUSATE SODIUM 100 MG PO CAPS
100.0000 mg | ORAL_CAPSULE | Freq: Two times a day (BID) | ORAL | Status: DC
  Filled 2023-09-01: qty 1

## 2023-09-01 MED ORDER — SENNOSIDES-DOCUSATE SODIUM 8.6-50 MG PO TABS
1.0000 | ORAL_TABLET | Freq: Two times a day (BID) | ORAL | Status: DC
  Administered 2023-09-01 – 2023-09-02 (×3): 1 via ORAL
  Filled 2023-09-01 (×3): qty 1

## 2023-09-01 MED ORDER — MIRABEGRON ER 25 MG PO TB24
50.0000 mg | ORAL_TABLET | Freq: Every day | ORAL | Status: DC
Start: 2023-09-01 — End: 2023-09-02
  Administered 2023-09-01: 50 mg via ORAL
  Filled 2023-09-01: qty 2
  Filled 2023-09-01: qty 1

## 2023-09-01 MED ORDER — ISOSORBIDE MONONITRATE ER 30 MG PO TB24
30.0000 mg | ORAL_TABLET | Freq: Every day | ORAL | Status: DC
  Administered 2023-09-01 – 2023-09-02 (×2): 30 mg via ORAL
  Filled 2023-09-01 (×2): qty 1

## 2023-09-01 MED ORDER — ALBUTEROL SULFATE HFA 108 (90 BASE) MCG/ACT IN AERS: 2.0000 | INHALATION_SPRAY | RESPIRATORY_TRACT | Status: DC | PRN

## 2023-09-01 MED ORDER — FINASTERIDE 5 MG PO TABS
5.0000 mg | ORAL_TABLET | Freq: Every day | ORAL | Status: DC
  Administered 2023-09-01 – 2023-09-02 (×2): 5 mg via ORAL
  Filled 2023-09-01 (×2): qty 1

## 2023-09-01 NOTE — Progress Notes (Signed)
Family concerned that eye drops are not being given correctly, home schedule every 6 hours, spoke to pharmacist on phone to request rescheduling eye drops for every 6 hour schedule.

## 2023-09-01 NOTE — Progress Notes (Addendum)
  Patient Name: Dakota [name] Date of Encounter: 09/01/2023  Primary Cardiologist: Nicki Guadalajara, MD Electrophysiologist: None  Interval Summary   Off dopamine yesterday after procedure. Difficulty with IV access. Pt's daughter pending foot surgery > may complicate his care post PPM / may need short term SNF.   At this time, the patient denies chest pain, shortness of breath, or any new concerns.  Vital Signs    Vitals:   09/01/23 0400 09/01/23 0500 09/01/23 0600 09/01/23 0631  BP: (!) 154/77 (!) 152/67 (!) 161/74   Pulse: 70 70 69   Resp: (!) 21 (!) 21 (!) 24   Temp:      TempSrc:      SpO2: 96% 98% 98%   Weight:    44.1 kg  Height:    5\' 3"  (1.6 m)    Intake/Output Summary (Last 24 hours) at 09/01/2023 0637 Last data filed at 09/01/2023 0600 Gross per 24 hour  Intake 1105.68 ml  Output 705 ml  Net 400.68 ml   Filed Weights   08/30/23 0500 08/31/23 0500 09/01/23 0631  Weight: 44.6 kg 43.1 kg 44.1 kg    Physical Exam    GEN- The patient is well appearing, alert and oriented x 3 today.   Lungs- Clear to ausculation bilaterally, normal work of breathing Cardiac- Regular rate and rhythm, no murmurs, rubs or gallops GI- soft, NT, ND, + BS Extremities- no clubbing or cyanosis. No edema  Telemetry    VP 70, AF (personally reviewed)  Hospital Course    ABDIKADIR FOHL is a 89 y.o. male with PMH of permanent AF, LBBB, chronic systolic CHF, CAD s/p CABG x4, chronic peripheral edema & DM II who was admitted on 11/3 with several days of worsening fatigue, anorexia / poor intake, and failure to thrive.  Admitted with severe symptomatic bradycardia in the setting of CHB, perm AF. Started on dopamine infusion. PPM implanted on 08/31/23. Weaned off dopamine post implant.    Assessment & Plan    Severe Symptomatic Bradycardia due to Complete Heart Block  LBBB -appropriate VP at 70, underlying AF -will review post op instructions with daughter  -follow up for PPM wound check in 2  weeks, and 3 months with Dr. Ladona Ridgel in place   Permanent Atrial Fibrillation  -hold eliquis, ok to restart 5 days post implant (11/11)   Chronic Systolic CHF  Chronic Peripheral Edema  -per primary    CAD s/p CABG -statin      EP will be available PRN. Please call back if new needs arise.   For questions or updates, please contact [facility] Please consult [email or web address] for contact info under Cardiology/STEMI.  Signed, Canary Brim, MSN, APRN, NP-C, AGACNP-BC [facility] - Electrophysiology  09/01/2023, 6:42 AM  EP Attending  Patient seen and examined. Agree with above. The patient is doing well after PPM. His PM interrogation demonstrates normal single chamber PPM function and CXR shows no PTX and stable lead position. He can be discharged.   Sharlot Gowda Mikaiah Stoffer,MD

## 2023-09-01 NOTE — Evaluation (Signed)
Speech Language Pathology Evaluation Patient Details Name: Dakota [name] MRN: [id number] DOB: Jun 20, 1926 Today's Date: 09/01/2023 Time: 9811-9147 SLP Time Calculation (min) (ACUTE ONLY): 10 min  Problem List:  Patient Active Problem List   Diagnosis Date Noted   Atrial fibrillation with slow ventricular response (HCC) 08/31/2023   Symptomatic bradycardia 08/29/2023   SBO (small bowel obstruction) (HCC) 05/10/2023   Inguinal hernia of right side with obstruction 05/10/2023   Cardiac arrhythmia 11/26/2019   Chest pain 04/10/2018   Nonspecific chest pain    Essential hypertension 09/07/2016   COPD, moderate (HCC) 06/13/2015   CAD (coronary artery disease) of bypass graft 05/23/2015   Coronary artery disease involving native coronary artery of native heart with angina pectoris (HCC)    Abnormal nuclear stress test    Ischemic chest pain (HCC)    Cough 09/26/2012   Malaise and fatigue 04/11/2012   Hx of CABG 04/03/2011   Left bundle branch block 04/03/2011   Diabetes (HCC) 04/03/2011   Hypercholesterolemia 04/03/2011   Chronic systolic CHF (congestive heart failure) (HCC) 04/03/2011   Past Medical History:  Past Medical History:  Diagnosis Date   Aortic atherosclerosis (HCC)    Arthritis    Atrial fibrillation, chronic (HCC)    Benign localized prostatic hyperplasia with lower urinary tract symptoms (LUTS)    CAD (coronary artery disease)    Chronic chest pain    takes imdur   Chronic combined systolic (congestive) and diastolic (congestive) heart failure (HCC)    followed by cardiology   Chronic dyspnea    Cognitive impairment    COPD, moderate (HCC)    Edema of both lower extremities    takes lasix and uses teds   First degree heart block    GERD (gastroesophageal reflux disease)    Heart murmur    History of cardiovascular stress test    Lexiscan Myoview 7/16:  EF 51%, inferior, inferolateral ischemia; Intermediate Risk   History of smallpox    HOH (hard of hearing)     even with hearing aids   Hypertension    Ischemic heart disease 2005   Dr Tresa Endo a. s/p CABG;  b.  LHC 05/23/15:  S-RPAVB ok, S-D2 ok, S-OM2 99% (s/p Synergy DES), L-LAD ok, EF normal (complicated by pseudoaneurysm)    LBBB (left bundle branch block)    Right arm weakness    S/P CABG x 4    01-08-2004   S/P drug eluting coronary stent placement    05-21-2015  PCI and DES x1 to SVG--OM graft   Thalassemia minor    Type 2 diabetes, diet controlled (HCC)    Wears glasses    Wears hearing aid in both ears    Past Surgical History:  Past Surgical History:  Procedure Laterality Date   APPENDECTOMY     CARDIAC CATHETERIZATION  1989   in [place]   normal     CARDIAC CATHETERIZATION N/A 05/23/2015   Procedure: Left Heart Cath and Cors/Grafts Angiography;  Surgeon: Lennette Bihari, MD;  Location: [facility] INVASIVE CV LAB;  Service: Cardiovascular;  Laterality: N/A;   CARDIAC CATHETERIZATION N/A 05/23/2015   Procedure: Coronary Stent Intervention;  Surgeon: Lennette Bihari, MD;  Location: [facility] INVASIVE CV LAB;  Service: Cardiovascular;  Laterality: N/A;   CARDIAC CATHETERIZATION  01-04-2004   dr Melburn Popper  @[facility]    severe 3V CAD, normal LVF   CATARACT EXTRACTION W/ INTRAOCULAR LENS  IMPLANT, BILATERAL     CORONARY ARTERY BYPASS GRAFT  01-08-2004  dr hendrickson @[facility]    LIMA to LAD,  SVG to D1,  SVG to OM,  SVG to PDA   FOOT SURGERY Right    INGUINAL HERNIA REPAIR Bilateral 1978   INGUINAL HERNIA REPAIR Bilateral 05/12/2023   Procedure: HERNIA REPAIR INGUINAL ADULT;  Surgeon: Griselda Miner, MD;  [facility] OR;  Service: General;  Laterality: Bilateral;   INSERTION OF MESH Bilateral 05/12/2023   Procedure: INSERTION OF MESH;  Surgeon: Griselda Miner, MD;  Location[facility] OR;  Service: General;  Laterality: Bilateral;   MASS EXCISION Left 11/20/2019   Procedure: EXCISION OF SEBACEOUS CYST CHEST;  Surgeon: Sheliah Hatch De Blanch, MD;  Location: [facility];  Service: General;  Laterality: Left;    PACEMAKER IMPLANT N/A 08/31/2023   Procedure: PACEMAKER IMPLANT;  Surgeon: Marinus Maw, MD;  Location: [facility] INVASIVE CV LAB;  Service: Cardiovascular;  Laterality: N/A;   HPI:  89 y.o. male presented 08/29/23 with progressive weakness. Pt found to be bradycardic with HR in 30s. Pt underwent pacemaker placement on 11/5. PMH significant of chronic right inguinal hernia, s/p bilat inguinal hernia surgery in July., PAF on Eliquis, CAD status post CABG x 4, chronic HFrEF LVEF 30-50%, HTN, IIDM,   Assessment / Plan / Recommendation Clinical Impression  Pt presents with functional cognitive-communication with fluent, clear speech; intact expressive language with no deficits in naming; preserved ability to follow commands; orientation to surroundings with some difficulty recalling specific events surrounding hospitalization.  His daughter describes hx of some difficulty with memory, but not to the degree that it impacts his daily life given the support she provides. He does not have acute changes in cognition that require SLP f/u.  Our service will sign off.    SLP Assessment  SLP Recommendation/Assessment: Patient does not need any further Speech Lanaguage Pathology Services SLP Visit Diagnosis: Cognitive communication deficit (R41.841)    Recommendations for follow up therapy are one component of a multi-disciplinary discharge planning process, led by the attending physician.  Recommendations may be updated based on patient status, additional functional criteria and insurance authorization.    Follow Up Recommendations  No SLP follow up                       SLP Evaluation Cognition  Overall Cognitive Status: History of cognitive impairments - at baseline Arousal/Alertness: Awake/alert Orientation Level: Oriented to person;Oriented to place;Disoriented to time;Disoriented to situation Attention: Sustained Focused Attention: Appears intact Sustained Attention: Appears intact Memory  Impairment: Decreased recall of new information Awareness: Appears intact       Comprehension  Auditory Comprehension Overall Auditory Comprehension: Appears within functional limits for tasks assessed Yes/No Questions: Within Functional Limits Commands: Within Functional Limits One Step Basic Commands: 75-100% accurate Visual Recognition/Discrimination Discrimination: Within Function Limits Reading Comprehension Reading Status: Not tested    Expression Expression Primary Mode of Expression: Verbal Verbal Expression Overall Verbal Expression: Appears within functional limits for tasks assessed Written Expression Dominant Hand: Right   Oral / Motor  Oral Motor/Sensory Function Overall Oral Motor/Sensory Function: Within functional limits Motor Speech Overall Motor Speech: Appears within functional limits for tasks assessed            Blenda Mounts Laurice 09/01/2023, 12:12 PM Grisela Mesch L. Samson Frederic, MA CCC/SLP Clinical Specialist - Acute Care SLP Acute Rehabilitation Services Office number [phone]

## 2023-09-01 NOTE — Evaluation (Signed)
Occupational Therapy Evaluation Patient Details Name: Dakota [name] MRN: [id number] DOB: 1926/01/17 Today's Date: 09/01/2023   History of Present Illness 89 y.o. male presented 08/29/23 with progressive weakness. Pt found to be bradycardic with HR in 30s. Pt underwent pacemaker placement on 11/5. PMH significant of chronic right inguinal hernia, s/p bilat inguinal hernia surgery in July., PAF on Eliquis, CAD status post CABG x 4, chronic HFrEF LVEF 30-50%, HTN, IIDM,   Clinical Impression   PTA, pt living at home with daughter who assisted with ADL and IADL; pt ambulatory within the home, but daughter reports pt much more sedentary since recent inguinal surgery in July. Upon eval, pt with generalized weakness, malaise, poor activity tolerance. Needing mod cues to maintain pacemaker precautions. Will continue to follow. Daughter refuses SNF; recommending HHOT to optimize safety and independence in ADL and IADL.       If plan is discharge home, recommend the following: A lot of help with walking and/or transfers;A lot of help with bathing/dressing/bathroom;Two people to help with walking and/or transfers;Assistance with cooking/housework;Assistance with feeding;Assist for transportation;Help with stairs or ramp for entrance    Functional Status Assessment  Patient has had a recent decline in their functional status and demonstrates the ability to make significant improvements in function in a reasonable and predictable amount of time.  Equipment Recommendations  BSC/3in1;Hospital bed    Recommendations for Other Services       Precautions / Restrictions Precautions Precautions: Fall;ICD/Pacemaker Restrictions Weight Bearing Restrictions: Yes LUE Weight Bearing: Non weight bearing Other Position/Activity Restrictions: L UE sling      Mobility Bed Mobility Overal bed mobility: Needs Assistance Bed Mobility: Supine to Sit     Supine to sit: Max assist     General bed mobility  comments: HOB elevated, with verbal and tactile cues pt able to initiate LE movement to EOB, maxA for trunk elevation and to scoot to EOB    Transfers Overall transfer level: Needs assistance Equipment used: 1 person hand held assist Transfers: Sit to/from Stand, Bed to chair/wheelchair/BSC Sit to Stand: Max assist, +2 safety/equipment Stand pivot transfers: Max assist, +2 safety/equipment         General transfer comment: pt with fear of falling therefore resistive/retropulsive, L UE in sling, maxA to power up and pvt on bilat feet to chair      Balance Overall balance assessment: Needs assistance Sitting-balance support: Feet supported, Single extremity supported Sitting balance-Leahy Scale: Poor Sitting balance - Comments: posterior lean, unable to maintain midline position Postural control: Posterior lean Standing balance support: Single extremity supported Standing balance-Leahy Scale: Zero Standing balance comment: dependent on external support                           ADL either performed or assessed with clinical judgement   ADL Overall ADL's : Needs assistance/impaired Eating/Feeding: Sitting;Minimal assistance   Grooming: Sitting;Minimal assistance   Upper Body Bathing: Moderate assistance;Sitting   Lower Body Bathing: Total assistance   Upper Body Dressing : Maximal assistance;Sitting   Lower Body Dressing: Total assistance;Sit to/from stand   Toilet Transfer: Maximal assistance;+2 for safety/equipment Toilet Transfer Details (indicate cue type and reason): RN present assisting with lines Toileting- Clothing Manipulation and Hygiene: Total assistance       Functional mobility during ADLs: Maximal assistance;+2 for safety/equipment       Vision Baseline Vision/History: 1 Wears glasses Ability to See in Adequate Light: 0 Adequate Patient Visual Report: No  change from baseline Additional Comments: Pt reports vision is at baseline; making  good eye contact     Perception Perception: Not tested       Praxis Praxis: Not tested       Pertinent Vitals/Pain Pain Assessment Pain Assessment: Faces Faces Pain Scale: Hurts little more Pain Location: generalized with mobility Pain Descriptors / Indicators: Grimacing Pain Intervention(s): Limited activity within patient's tolerance, Monitored during session     Extremity/Trunk Assessment Upper Extremity Assessment Upper Extremity Assessment: Generalized weakness;Right hand dominant;LUE deficits/detail LUE Deficits / Details: pacemaker precautions in sling   Lower Extremity Assessment Lower Extremity Assessment: Generalized weakness   Cervical / Trunk Assessment Cervical / Trunk Assessment: Other exceptions Cervical / Trunk Exceptions: L chest pacemaker placement   Communication Communication Communication: Hearing impairment Cueing Techniques: Verbal cues;Gestural cues   Cognition Arousal: Alert Behavior During Therapy: Flat affect Overall Cognitive Status: Difficult to assess                                 General Comments: pt HOH, able to follow simple commands with incresaed time, fearful of falling.     General Comments  VSS    Exercises     Shoulder Instructions      Home Living Family/patient expects to be discharged to:: Private residence Living Arrangements: Children Available Help at Discharge: Family;Available 24 hours/day Type of Home: Apartment Home Access: Elevator     Home Layout: One level     Bathroom Shower/Tub: Chief Strategy Officer: Standard     Home Equipment: Tub bench;Cane - single Librarian, academic (2 wheels);Rollator (4 wheels)      Lives With: Daughter    Prior Functioning/Environment Prior Level of Function : Needs assist             Mobility Comments: walks short distances in apartment with rollator ADLs Comments: dtr does cooking, cleaning, assist with bathing, uses tub bench         OT Problem List: Decreased strength;Decreased activity tolerance;Impaired balance (sitting and/or standing);Decreased cognition;Decreased safety awareness      OT Treatment/Interventions: Self-care/ADL training;Therapeutic exercise;DME and/or AE instruction;Balance training;Patient/family education;Therapeutic activities    OT Goals(Current goals can be found in the care plan section) Acute Rehab OT Goals Patient Stated Goal: go home OT Goal Formulation: With patient Time For Goal Achievement: 09/15/23 Potential to Achieve Goals: Good  OT Frequency: Min 1X/week    Co-evaluation              AM-PAC OT "6 Clicks" Daily Activity     Outcome Measure Help from another person eating meals?: A Little Help from another person taking care of personal grooming?: A Little Help from another person toileting, which includes using toliet, bedpan, or urinal?: A Lot Help from another person bathing (including washing, rinsing, drying)?: A Lot Help from another person to put on and taking off regular upper body clothing?: A Lot Help from another person to put on and taking off regular lower body clothing?: A Lot 6 Click Score: 14   End of Session Equipment Utilized During Treatment: Gait belt;Other (comment) (L sling) Nurse Communication: Mobility status  Activity Tolerance: Patient tolerated treatment well Patient left: in bed;with call bell/phone within reach;with bed alarm set;with nursing/sitter in room;with family/visitor present  OT Visit Diagnosis: Unsteadiness on feet (R26.81);Muscle weakness (generalized) (M62.81)  Time: 1120-1130 OT Time Calculation (min): 10 min Charges:  OT General Charges $OT Visit: 1 Visit OT Evaluation $OT Eval Moderate Complexity: 1 Mod  Dakota [name], OTR/L [facility] Acute Rehabilitation Office: [phone]   Dakota [name] 09/01/2023, 1:12 PM

## 2023-09-01 NOTE — Progress Notes (Signed)
NAME:  Dakota [name], MRN:  [id number], DOB:  04-28-1926, LOS: 3 ADMISSION DATE:  08/29/2023, CONSULTATION DATE: 08/29/2023 REFERRING MD:  Denton Lank - EDP CHIEF COMPLAINT: Generalized weakness, nausea  History of Present Illness:  89 year old male with coronary artery disease status post CABG, chronic combined systolic and diastolic heart failure, hypertension, diabetes type 2 and BPH who was brought into the emergency department by his daughter with a complaint of generalized weakness associated with nausea for past few days.  As per patient's daughter he was in usual state of health until June of this year, in July he had bilateral inguinal hernia surgery since then he has been feeling weak and not that active.  For the past 2 days he has been feeling generalized weak, not getting out of bed, not been able to eat or drink.  He was brought in the emergency department, upon arrival his heart rate was noted to be in the low 30s, EKG was done which is consistent with complete heart block.  Patient was seen by cardiology, he was started on dopamine without much improvement in heart rate though his blood pressure remained stable.  Pertinent Medical History:   Past Medical History:  Diagnosis Date   Aortic atherosclerosis (HCC)    Arthritis    Atrial fibrillation, chronic (HCC)    Benign localized prostatic hyperplasia with lower urinary tract symptoms (LUTS)    CAD (coronary artery disease)    Chronic chest pain    takes imdur   Chronic combined systolic (congestive) and diastolic (congestive) heart failure (HCC)    followed by cardiology   Chronic dyspnea    Cognitive impairment    COPD, moderate (HCC)    Edema of both lower extremities    takes lasix and uses teds   First degree heart block    GERD (gastroesophageal reflux disease)    Heart murmur    History of cardiovascular stress test    Lexiscan Myoview 7/16:  EF 51%, inferior, inferolateral ischemia; Intermediate Risk   History of  smallpox    HOH (hard of hearing)    even with hearing aids   Hypertension    Ischemic heart disease 2005   Dr Tresa Endo a. s/p CABG;  b.  LHC 05/23/15:  S-RPAVB ok, S-D2 ok, S-OM2 99% (s/p Synergy DES), L-LAD ok, EF normal (complicated by pseudoaneurysm)    LBBB (left bundle branch block)    Right arm weakness    S/P CABG x 4    01-08-2004   S/P drug eluting coronary stent placement    05-21-2015  PCI and DES x1 to SVG--OM graft   Thalassemia minor    Type 2 diabetes, diet controlled (HCC)    Wears glasses    Wears hearing aid in both ears    Significant Hospital Events: Including procedures, antibiotic start and stop dates in addition to other pertinent events   11/3 - Admitted with symptomatic bradycardia. Plans for PPM following AC washout. Dopamine gtt 5/hr. 11/5 PPM implantation, off dopamine later in day 11/6 HR stable in 70s with pacing  Interim History / Subjective:  Off Dopamine. PPM implantation uneventful. HR 70s this AM, V paced  Objective:  Blood pressure (!) 141/78, pulse 69, temperature 97.8 F (36.6 C), temperature source Axillary, resp. rate (!) 25, height 5\' 3"  (1.6 m), weight 44.1 kg, SpO2 97%.        Intake/Output Summary (Last 24 hours) at 09/01/2023 0909 Last data filed at 09/01/2023 0700 Gross per 24 hour  Intake 855.16 ml  Output 650 ml  Net 205.16 ml   Filed Weights   08/30/23 0500 08/31/23 0500 09/01/23 0631  Weight: 44.6 kg 43.1 kg 44.1 kg   Physical Examination: General: Elderly male, resting in bed, in NAD. Neuro: A&O x 3, globally weak but no deficits. HEENT: [place]/AT. Sclerae anicteric. EOMI. Cardiovascular: RRR with V pacing, ? Soft murmur.  Lungs: Respirations even and unlabored.  CTA bilaterally, No W/R/R. Abdomen: BS x 4, soft, NT/ND.  Musculoskeletal: No gross deformities, no edema.  Skin: Intact, warm, no rashes.   Assessment & Plan:   Severe symptomatic bradycardia in the setting of complete heart block - resolved after PPM  implantation 11/5 - EP following, appreciate the assistance. To review post op instructions with daughter an schedule outpatient follow up - Ongoing supportive care - OK to resume PTA Eliquis 11/11  Coronary artery disease status post CABG Chronic combined systolic and diastolic heart failure Hypertension - Resume PTA Imdur - Holding PTA Losartan, Lasix for now - Continue PTA statin  Chronic A-fib - PTA Eliquis on hod until 11/11 (5 days post implant) - Optimize electrolytes for K > 4, Mg > 2  Diabetes type 2 - SSI - CBGs Q4H - Goal CBG 140-180  Anemia of chronic disease - Trend H&H - Monitor for signs of active bleeding - Transfuse for Hgb < 7.0 or hemodynamically significant bleeding  Deconditioning/Debility. - Might need SNF temporarily after discharge as daughter/caretaker is scheduled for foot surgery. - Will place PT/OT consult today   Stable for transfer out of ICU. Will ask TR[facility] to assume care in AM 11/7 with PCCM off. Please call if we can be of further assistance  Best Practice (right click and "Reselect all SmartList Selections" daily)   Diet/type: Regular consistency (see orders) DVT prophylaxis: SCD GI prophylaxis: N/A Lines: N/A Foley:  N/A Code Status:  full code Last date of multidisciplinary goals of care discussion [11/3: Patient's daughter was updated at bedside, decision was to continue full scope of care]   Rutherford Guys, PA - [facility] For pager details, please see AMION or use Epic chat  After 1900, please call [facility] for cross coverage needs 09/01/2023, 9:22 AM

## 2023-09-01 NOTE — Evaluation (Signed)
Physical Therapy Evaluation Patient Details Name: Dakota [name] MRN: [id number] DOB: Sep 06, 1926 Today's Date: 09/01/2023  History of Present Illness  89 y.o. male presented 08/29/23 with progressive weakness. Pt found to be bradycardic with HR in 30s. Pt underwent pacemaker placement on 11/5. PMH significant of chronic right inguinal hernia, s/p bilat inguinal hernia surgery in July., PAF on Eliquis, CAD status post CABG x 4, chronic HFrEF LVEF 30-50%, HTN, IIDM,   Clinical Impression  Pt admitted with above. PTA pt living with daughter, ambulating short household distances with cane and rollator, dtr assisting with dressing and bathing. Pt now unable to use L UE due to pacemaker precautions and is requiring maxA for mobility. Dtr is currently in a w/c due to L foot NWB with planned outpt surgery on foot this Friday 11/7. Discussed rehab vs home for patient and dtr adamantly refused rehab stating that she will have someone with him when she has surgery. Educated daughter on how patient currently requiring maxA and asked if she and or the person she has coming to help could provide that and she stated yes. Acute PT to cont to follow. Recommend HHPT and 24/7 assist for safe transition home.        If plan is discharge home, recommend the following: A lot of help with walking and/or transfers;A lot of help with bathing/dressing/bathroom;Assist for transportation;Supervision due to cognitive status;Assistance with cooking/housework   Can travel by private vehicle        Equipment Recommendations Hospital bed  Recommendations for Other Services       Functional Status Assessment Patient has had a recent decline in their functional status and demonstrates the ability to make significant improvements in function in a reasonable and predictable amount of time.     Precautions / Restrictions Restrictions Weight Bearing Restrictions: Yes LUE Weight Bearing: Non weight bearing Other Position/Activity  Restrictions: L UE sling      Mobility  Bed Mobility Overal bed mobility: Needs Assistance Bed Mobility: Supine to Sit     Supine to sit: Max assist     General bed mobility comments: HOB elevated, with verbal and tactile cues pt able to initiate LE movement to EOB, maxA for trunk elevation and to scoot to EOB    Transfers Overall transfer level: Needs assistance Equipment used: 1 person hand held assist Transfers: Sit to/from Stand, Bed to chair/wheelchair/BSC Sit to Stand: Max assist, +2 safety/equipment Stand pivot transfers: Max assist, +2 safety/equipment         General transfer comment: pt with fear of falling therefore resistive/retropulsive, L UE in sling, maxA to power up and pvt on bilat feet to chair    Ambulation/Gait               General Gait Details: unable this date  Stairs            Wheelchair Mobility     Tilt Bed    Modified Rankin (Stroke Patients Only)       Balance Overall balance assessment: Needs assistance Sitting-balance support: Feet supported, Single extremity supported Sitting balance-Leahy Scale: Poor Sitting balance - Comments: posterior lean, unable to maintain midline position Postural control: Posterior lean Standing balance support: Single extremity supported Standing balance-Leahy Scale: Zero Standing balance comment: dependent on external support                             Pertinent Vitals/Pain Pain Assessment Pain Assessment: Faces Faces  Pain Scale: Hurts little more Pain Location: generalized with mobility Pain Descriptors / Indicators: Grimacing Pain Intervention(s): Monitored during session    Home Living Family/patient expects to be discharged to:: Private residence Living Arrangements: Children Available Help at Discharge: Family;Available 24 hours/day Type of Home: Apartment Home Access: Elevator       Home Layout: One level Home Equipment: Tub bench;Cane - single  Librarian, academic (2 wheels);Rollator (4 wheels)      Prior Function Prior Level of Function : Needs assist             Mobility Comments: walks short distances in apartment with rollator ADLs Comments: dtr does cooking, cleaning, assist with bathing, uses tub bench     Extremity/Trunk Assessment   Upper Extremity Assessment Upper Extremity Assessment: Defer to OT evaluation    Lower Extremity Assessment Lower Extremity Assessment: Generalized weakness    Cervical / Trunk Assessment Cervical / Trunk Assessment: Other exceptions Cervical / Trunk Exceptions: L chest pacemaker placement  Communication   Communication Communication: Hearing impairment  Cognition Arousal: Alert Behavior During Therapy: Flat affect Overall Cognitive Status: Within Functional Limits for tasks assessed                                 General Comments: pt HOH, able to follow simple commands with incresaed time, fearful of falling        General Comments General comments (skin integrity, edema, etc.): VSS    Exercises     Assessment/Plan    PT Assessment Patient needs continued PT services  PT Problem List Decreased strength;Decreased range of motion;Decreased activity tolerance;Decreased balance;Decreased mobility;Decreased coordination;Decreased cognition;Decreased knowledge of use of DME;Decreased safety awareness       PT Treatment Interventions DME instruction;Gait training;Functional mobility training;Therapeutic activities;Therapeutic exercise;Balance training;Neuromuscular re-education    PT Goals (Current goals can be found in the Care Plan section)  Acute Rehab PT Goals Patient Stated Goal: didn't state PT Goal Formulation: With patient/family Time For Goal Achievement: 09/15/23 Potential to Achieve Goals: Good    Frequency Min 1X/week     Co-evaluation               AM-PAC PT "6 Clicks" Mobility  Outcome Measure Help needed turning from your  back to your side while in a flat bed without using bedrails?: A Lot Help needed moving from lying on your back to sitting on the side of a flat bed without using bedrails?: A Lot Help needed moving to and from a bed to a chair (including a wheelchair)?: A Lot Help needed standing up from a chair using your arms (e.g., wheelchair or bedside chair)?: A Lot Help needed to walk in hospital room?: Total Help needed climbing 3-5 steps with a railing? : Total 6 Click Score: 10    End of Session   Activity Tolerance: Patient limited by fatigue Patient left: in chair;with call bell/phone within reach;with nursing/sitter in room;with family/visitor present Nurse Communication: Mobility status (RN present to assist with transfer) PT Visit Diagnosis: Difficulty in walking, not elsewhere classified (R26.2);Unsteadiness on feet (R26.81)    Time: 1610-9604 PT Time Calculation (min) (ACUTE ONLY): 25 min   Charges:   PT Evaluation $PT Eval Moderate Complexity: 1 Mod PT Treatments $Therapeutic Activity: 8-22 mins PT General Charges $$ ACUTE PT VISIT: 1 Visit         Lewis Shock, PT, DPT Acute Rehabilitation Services Secure chat preferred Office #: [phone]  Dustin Burrill M Myria Steenbergen 09/01/2023, 10:22 AM

## 2023-09-02 DIAGNOSIS — R001 Bradycardia, unspecified: Secondary | ICD-10-CM | POA: Diagnosis not present

## 2023-09-02 DIAGNOSIS — I5043 Acute on chronic combined systolic (congestive) and diastolic (congestive) heart failure: Secondary | ICD-10-CM | POA: Diagnosis not present

## 2023-09-02 DIAGNOSIS — R531 Weakness: Secondary | ICD-10-CM | POA: Diagnosis not present

## 2023-09-02 DIAGNOSIS — I4891 Unspecified atrial fibrillation: Secondary | ICD-10-CM | POA: Diagnosis not present

## 2023-09-02 LAB — GLUCOSE, CAPILLARY
Glucose-Capillary: 118 mg/dL — ABNORMAL HIGH (ref 70–99)
Glucose-Capillary: 230 mg/dL — ABNORMAL HIGH (ref 70–99)

## 2023-09-02 MED ORDER — APIXABAN 2.5 MG PO TABS: 2.5000 mg | ORAL_TABLET | Freq: Two times a day (BID) | ORAL | Status: DC

## 2023-09-02 MED ORDER — LOSARTAN POTASSIUM 25 MG PO TABS: 25.0000 mg | ORAL_TABLET | Freq: Every day | ORAL | Status: DC

## 2023-09-02 NOTE — Discharge Summary (Addendum)
Physician Discharge Summary  MONEY ETLING ZOX:[id number] DOB: Jan 20, 1926 DOA: 08/29/2023  PCP: [name], MD  Admit date: 08/29/2023 Discharge date: 09/02/2023  Admitted From: Home Disposition:  Home  Recommendations for Outpatient Follow-up:  Follow up with PCP in 1-2 weeks Please obtain BMP/CBC in one week Please follow up on the following pending results:  Home Health:Yes Equipment/Devices:None  Discharge Condition:Guarded CODE STATUS:Full Diet recommendation: Heart Healthy  Brief/Interim Summary:  89 y.o. male past medical history of coronary artery disease with history of CABG, chronic combined systolic and diastolic heart failure with a last EF of 40% in 2024, essential hypertension diabetes mellitus type 2 brought into the emergency room for complaints of generalized weakness associated with nausea for the past few days in the ED was found to have a heart rate of 30 twelve-lead EKG was done that showed complete heart block seen by cardiology started on dopamine admitted under PCCM service transferred to [facility] on 09/02/2023.    Discharge Diagnoses:  Principal Problem:   Symptomatic bradycardia Active Problems:   Atrial fibrillation with slow ventricular response (HCC)  Symptomatic bradycardia: EP was consulted and status post pacemaker implantation 08/31/2023. He is okay to resume Eliquis on 09/06/2023. PT OT evaluated the patient recommended home health PT.  Coronary artery disease status post CABG: No change made to his medication continue Imdur and statins. He will resume his Lasix at home we will continue to hold losartan to be resume as an outpatient by cardiologist.  Atrial fibrillation with slow ventricular rate: Eliquis was held will be resumed on 09/06/2023 Potassium is Greater than 4 magnesium greater than 2.  Diabetes mellitus type 2: Change made to his medication.  Iron deficiency anemia: Hemoglobin stable follow-up with PCP as an  outpatient.  Deconditioning/debility: PT OT evaluated the patient will need home health PT.  Discharge Instructions  Discharge Instructions     Diet - low sodium heart healthy   Complete by: As directed    Increase activity slowly   Complete by: As directed       Allergies as of 09/02/2023       Reactions   Aceon [perindopril Erbumine] Shortness Of Breath, Cough   Sulfa Antibiotics Swelling   Pork-derived Products Other (See Comments)   muslim        Medication List     TAKE these medications    Acetaminophen Extra Strength 500 MG Tabs Commonly known as: TYLENOL Take 1 tablet (500 mg total) by mouth every 8 (eight) hours as needed for moderate pain.   albuterol 108 (90 Base) MCG/ACT inhaler Commonly known as: VENTOLIN HFA Inhale 2 puffs into the lungs every 4 (four) hours as needed for wheezing or shortness of breath.   apixaban 2.5 MG Tabs tablet Commonly known as: Eliquis Take 1 tablet (2.5 mg total) by mouth 2 (two) times daily. Start taking on: September 06, 2023 What changed: These instructions start on September 06, 2023. If you are unsure what to do until then, ask your doctor or other care provider.   docusate sodium 100 MG capsule Commonly known as: COLACE Take 1 capsule (100 mg total) by mouth 2 (two) times daily   finasteride 5 MG tablet Commonly known as: PROSCAR Take 5 mg by mouth at bedtime.   furosemide 20 MG tablet Commonly known as: LASIX 20 MG EVERY 4 DAYS What changed:  how much to take how to take this when to take this   isosorbide mononitrate 30 MG 24 hr tablet  Commonly known as: IMDUR Take 1 tablet by mouth once daily   ketorolac 0.5 % ophthalmic solution Commonly known as: ACULAR Place 1 drop into the left eye 4 (four) times daily.   losartan 25 MG tablet Commonly known as: COZAAR Take 1 tablet (25 mg total) by mouth daily. Start taking on: September 16, 2023 What changed: These instructions start on September 16, 2023. If  you are unsure what to do until then, ask your doctor or other care provider.   metFORMIN 500 MG tablet Commonly known as: GLUCOPHAGE Take 500 mg by mouth 2 (two) times daily.   mirabegron ER 50 MG Tb24 tablet Commonly known as: MYRBETRIQ Take 50 mg by mouth at bedtime.   multivitamin with minerals Tabs tablet Take 1 tablet by mouth daily.   Nitrostat 0.4 MG SL tablet Generic drug: nitroGLYCERIN DISSOLVE ONE TABLET UNDER THE TONGUE EVERY 5 MINUTES AS NEEDED FOR CHEST PAIN. What changed:  how much to take how to take this when to take this reasons to take this additional instructions   ONE TOUCH ULTRA 2 w/Device Kit daily in the afternoon.   ONE TOUCH ULTRA TEST test strip Generic drug: glucose blood 1 each by Other route as needed (glucose monoring).   pantoprazole 40 MG tablet Commonly known as: PROTONIX Take 1 tablet (40 mg total) by mouth daily.   pravastatin 80 MG tablet Commonly known as: PRAVACHOL Take 80 mg by mouth at bedtime.   prednisoLONE acetate 1 % ophthalmic suspension Commonly known as: PRED FORTE Place 1 drop into the left eye 4 (four) times daily.   predniSONE 10 MG tablet Commonly known as: DELTASONE Take 1 tablet by mouth daily.   PRESERVISION AREDS 2 PO Take 1 tablet by mouth 2 (two) times daily.        Allergies  Allergen Reactions   Aceon [Perindopril Erbumine] Shortness Of Breath and Cough   Sulfa Antibiotics Swelling   Pork-Derived Products Other (See Comments)    muslim    Consultations: PCCM EP.   Procedures/Studies: DG Chest 2 View  Result Date: 09/01/2023 CLINICAL DATA:  Pacemaker placement. EXAM: CHEST - 2 VIEW COMPARISON:  August 29, 2023. FINDINGS: Stable cardiomediastinal silhouette. Interval placement of left-sided single lead pacemaker with lead in grossly good position in expected position of right ventricle. Status post coronary bypass graft. Minimal bibasilar subsegmental atelectasis is noted. Minimal left  pleural effusion is noted. No pneumothorax is noted. IMPRESSION: Interval placement of single lead left-sided pacemaker in grossly good position. Minimal bibasilar subsegmental atelectasis with minimal left pleural effusion. Electronically Signed   By: Lupita Raider M.D.   On: 09/01/2023 10:11   EP PPM/ICD IMPLANT  Result Date: 08/31/2023 CONCLUSIONS:  1. Successful implantation of a [employer]onic single-chamber pacemaker for symptomatic bradycardia due to complete heart block  2. No early apparent complications.       Lewayne Bunting, MD 08/31/2023 1:57 PM   DG Chest Port 1 View  Result Date: 08/29/2023 CLINICAL DATA:  Weakness EXAM: PORTABLE CHEST 1 VIEW COMPARISON:  05/15/2023 FINDINGS: Sternal wires. Enlarged cardiopericardial silhouette. Elevation left hemidiaphragm with tiny left effusion. Vascular congestion. Mild interstitial edema suspected. No pneumothorax. Overlapping cardiac leads and defibrillator pads. Film is rotated to the left. IMPRESSION: Postop chest with enlarged heart. Vascular congestion with some interstitial edema. Tiny effusions. Film is rotated to the left Electronically Signed   By: Karen Kays M.D.   On: 08/29/2023 14:07   (Echo, Carotid, EGD, Colonoscopy, ERCP)    Subjective:  No complaints  Discharge Exam: Vitals:   09/02/23 0801 09/02/23 1115  BP: (!) 118/59 119/65  Pulse: 70 70  Resp: 20 20  Temp: 98.1 F (36.7 C) 98.4 F (36.9 C)  SpO2: 97% 100%   Vitals:   09/02/23 0318 09/02/23 0610 09/02/23 0801 09/02/23 1115  BP: 130/69  (!) 118/59 119/65  Pulse: 70  70 70  Resp: 17  20 20   Temp: 98.4 F (36.9 C)  98.1 F (36.7 C) 98.4 F (36.9 C)  TempSrc: Oral  Oral Oral  SpO2: 97%  97% 100%  Weight:  45.6 kg    Height:        General: Pt is alert, awake, not in acute distress Cardiovascular: RRR, S1/S2 +, no rubs, no gallops Respiratory: CTA bilaterally, no wheezing, no rhonchi Abdominal: Soft, NT, ND, bowel sounds + Extremities: no edema, no  cyanosis    The results of significant diagnostics from this hospitalization (including imaging, microbiology, ancillary and laboratory) are listed below for reference.     Microbiology: Recent Results (from the past 240 hour(s))  MRSA Next Gen by PCR, Nasal     Status: None   Collection Time: 08/29/23  4:16 PM   Specimen: Nasal Mucosa; Nasal Swab  Result Value Ref Range Status   MRSA by PCR Next Gen NOT DETECTED NOT DETECTED Final    Comment: (NOTE) The GeneXpert MRSA Assay (FDA approved for NASAL specimens only), is one component of a comprehensive MRSA colonization surveillance program. It is not intended to diagnose MRSA infection nor to guide or monitor treatment for MRSA infections. Test performance is not FDA approved in patients less than 63 years old. Performed at [facility], [address], [place], [place] [address]   Surgical PCR screen     Status: None   Collection Time: 08/31/23  9:19 AM   Specimen: Nasal Mucosa; Nasal Swab  Result Value Ref Range Status   MRSA, PCR NEGATIVE NEGATIVE Final   Staphylococcus aureus NEGATIVE NEGATIVE Final    Comment: (NOTE) The Xpert SA Assay (FDA approved for NASAL specimens in patients 81 years of age and older), is one component of a comprehensive surveillance program. It is not intended to diagnose infection nor to guide or monitor treatment. Performed at [facility], [address] [place], [place] [address]      Labs: BNP (last 3 results) Recent Labs    05/15/23 0234 05/17/23 0101 08/29/23 1218  BNP 1,110.2* 943.2* 1,217.7*   Basic Metabolic Panel: Recent Labs  Lab 08/29/23 1218 08/29/23 1220 08/30/23 0605 08/31/23 0258 08/31/23 0830 09/01/23 0307  NA 139 141 140 137  --  135  K 4.3 4.3 3.7 3.8  --  3.8  CL 106 105 105 104  --  103  CO2 23  --  25 24  --  24  GLUCOSE 152* 148* 130* 105*  --  99  BUN 25* 32* 18 14  --  13  CREATININE 0.86 0.90 0.70 0.62  --  0.73  CALCIUM 8.6*  --  8.9  8.5*  --  8.0*  MG 2.0  --  1.9 1.6* 2.5* 2.1  PHOS  --   --   --  2.8  --  2.8   Liver Function Tests: Recent Labs  Lab 08/29/23 1218 08/30/23 0605  AST 28 17  ALT 21 21  ALKPHOS 46 47  BILITOT 1.9* 1.7*  PROT 6.3* 6.2*  ALBUMIN 3.6 3.6   No results  for input(s): "LIPASE", "AMYLASE" in the last 168 hours. No results for input(s): "AMMONIA" in the last 168 hours. CBC: Recent Labs  Lab 08/29/23 1218 08/29/23 1220 08/30/23 0605 08/31/23 0258 09/01/23 0307  WBC 6.8  --  8.8 8.9 7.0  HGB 10.9* 12.9* 11.9* 11.2* 11.0*  HCT 36.1* 38.0* 38.5* 36.5* 36.0*  MCV 64.5*  --  64.0* 62.7* 62.8*  PLT 157  --  163 160 145*   Cardiac Enzymes: No results for input(s): "CKTOTAL", "CKMB", "CKMBINDEX", "TROPONINI" in the last 168 hours. BNP: Invalid input(s): "POCBNP" CBG: Recent Labs  Lab 09/01/23 1145 09/01/23 1611 09/01/23 2109 09/02/23 0558 09/02/23 1121  GLUCAP 188* 78 157* 118* 230*   D-Dimer No results for input(s): "DDIMER" in the last 72 hours. Hgb A1c No results for input(s): "HGBA1C" in the last 72 hours. Lipid Profile No results for input(s): "CHOL", "HDL", "LDLCALC", "TRIG", "CHOLHDL", "LDLDIRECT" in the last 72 hours. Thyroid function studies No results for input(s): "TSH", "T4TOTAL", "T3FREE", "THYROIDAB" in the last 72 hours.  Invalid input(s): "FREET3" Anemia work up No results for input(s): "VITAMINB12", "FOLATE", "FERRITIN", "TIBC", "IRON", "RETICCTPCT" in the last 72 hours. Urinalysis    Component Value Date/Time   COLORURINE YELLOW 08/31/2023 1720   APPEARANCEUR CLEAR 08/31/2023 1720   LABSPEC 1.023 08/31/2023 1720   PHURINE 5.0 08/31/2023 1720   GLUCOSEU NEGATIVE 08/31/2023 1720   HGBUR LARGE (A) 08/31/2023 1720   BILIRUBINUR NEGATIVE 08/31/2023 1720   KETONESUR 5 (A) 08/31/2023 1720   PROTEINUR NEGATIVE 08/31/2023 1720   NITRITE NEGATIVE 08/31/2023 1720   LEUKOCYTESUR MODERATE (A) 08/31/2023 1720   Sepsis Labs Recent Labs  Lab  08/29/23 1218 08/30/23 0605 08/31/23 0258 09/01/23 0307  WBC 6.8 8.8 8.9 7.0   Microbiology Recent Results (from the past 240 hour(s))  MRSA Next Gen by PCR, Nasal     Status: None   Collection Time: 08/29/23  4:16 PM   Specimen: Nasal Mucosa; Nasal Swab  Result Value Ref Range Status   MRSA by PCR Next Gen NOT DETECTED NOT DETECTED Final    Comment: (NOTE) The GeneXpert MRSA Assay (FDA approved for NASAL specimens only), is one component of a comprehensive MRSA colonization surveillance program. It is not intended to diagnose MRSA infection nor to guide or monitor treatment for MRSA infections. Test performance is not FDA approved in patients less than 88 years old. Performed at [facility], [address] [place], [place] [address]   Surgical PCR screen     Status: None   Collection Time: 08/31/23  9:19 AM   Specimen: Nasal Mucosa; Nasal Swab  Result Value Ref Range Status   MRSA, PCR NEGATIVE NEGATIVE Final   Staphylococcus aureus NEGATIVE NEGATIVE Final    Comment: (NOTE) The Xpert SA Assay (FDA approved for NASAL specimens in patients 23 years of age and older), is one component of a comprehensive surveillance program. It is not intended to diagnose infection nor to guide or monitor treatment. Performed at [facility], [address] [place], [place] [address]      Time coordinating discharge: Over 35 minutes  SIGNED:   Marinda Elk, MD  [facility] 09/02/2023, 12:32 PM Pager   If 7PM-7AM, please contact night-coverage [email or web address] Password [email or web address]

## 2023-09-02 NOTE — Care Management Important Message (Signed)
Important Message  Patient Details  Name: Dakota [name] MRN: [id number] Date of Birth: 12/19/1925   Important Message Given:  Yes - Medicare IM     Dorena Bodo 09/02/2023, 2:52 PM

## 2023-09-02 NOTE — Progress Notes (Signed)
Physical Therapy Treatment Patient Details Name: Dakota [name] MRN: [id number] DOB: 01/29/1926 Today's Date: 09/02/2023   History of Present Illness 89 y.o. male presented 08/29/23 with progressive weakness. Pt found to be bradycardic with HR in 30s. Pt underwent pacemaker placement on 11/5. PMH significant of chronic right inguinal hernia, s/p bilat inguinal hernia surgery in July., PAF on Eliquis, CAD status post CABG x 4, chronic HFrEF LVEF 30-50%, HTN, IIDM,    PT Comments  Patient continues to require extensive assistance with all mobility. Max A required for bed mobility. Sitting balance is poor with external support required to maintain midline with posterior lean. 3 standing bouts performed with maximal cues for technique. Max A required with each stand. Standing balance is also poor with posterior lean and heavy reliance on bed or recliner for posterior leg support. The patient is fatigued with minimal activity.  The daughter states she is planning to bring the patient home but would like him to be more independent before discharge. She herself is primarily in a wheelchair at this time due to a foot injury and is limited on how much she can assist. He still requires maximal assistance for bed mobility/transfers, and is unable to ambulate safely at this time. Patient could benefit from continued rehabilitation <3 hours/day after this hospital stay. If patient is going home, he will need physical assistance and maximal home health.    If plan is discharge home, recommend the following: A lot of help with walking and/or transfers;A lot of help with bathing/dressing/bathroom;Assist for transportation;Supervision due to cognitive status;Assistance with cooking/housework   Can travel by private vehicle        Equipment Recommendations  Hospital bed;Wheelchair (measurements PT)    Recommendations for Other Services       Precautions / Restrictions Precautions Precautions:  Fall;ICD/Pacemaker Restrictions Weight Bearing Restrictions: Yes LUE Weight Bearing: Non weight bearing Other Position/Activity Restrictions: LUE in sling     Mobility  Bed Mobility Overal bed mobility: Needs Assistance Bed Mobility: Supine to Sit     Supine to sit: Max assist     General bed mobility comments: HOB elevated. left arm still in the sling. assistance required for LE and trunk support.    Transfers Overall transfer level: Needs assistance Equipment used: None Transfers: Sit to/from Stand, Bed to chair/wheelchair/BSC Sit to Stand: Max assist (for standing from recliner chair with arms) Stand pivot transfers: Max assist, From elevated surface         General transfer comment: patient performed stand pivot transfer from bed to recliner chair. he has a tendency to want to use LUE heavily with transfers (despite sling in place). cues to maintain minimal to no weight bearing with LUE. patient was able to stand twice from the recliner chair with Max A. faciliation for anterior weight shifting and pushing with RUE to stand    Ambulation/Gait             Pre-gait activities: cues for upright standing posture, maintaining upright balance. standing tolerance is limited to ~ 20 seonds with each standing bout and patient unable to achieve erect standing position. unsafe to attempt ambulation at this time General Gait Details: unable to at this time   Stairs             Wheelchair Mobility     Tilt Bed    Modified Rankin (Stroke Patients Only)       Balance Overall balance assessment: Needs assistance Sitting-balance support: Feet supported, Single extremity supported  Sitting balance-Leahy Scale: Poor Sitting balance - Comments: posterior lean, unable to maintain midline position Postural control: Posterior lean Standing balance support: Single extremity supported Standing balance-Leahy Scale: Zero Standing balance comment: patient relying on bed or  recliner chair for posterior leg support in standing, flexed posture. maximal assistance required                            Cognition Arousal: Alert Behavior During Therapy: Flat affect Overall Cognitive Status: Difficult to assess                                 General Comments: HOH even with hearing aides. increased time required to complete all tasks with multi modal cues needed        Exercises      General Comments        Pertinent Vitals/Pain Pain Assessment Pain Assessment: Faces Faces Pain Scale: Hurts a little bit Pain Location: generalized Pain Descriptors / Indicators: Discomfort Pain Intervention(s): Limited activity within patient's tolerance (daughter requested Tylenol- RN alerted)    Home Living                          Prior Function            PT Goals (current goals can now be found in the care plan section) Acute Rehab PT Goals Patient Stated Goal: daughter reports patient will need to go home due to religious beliefs but she wants patient to be more independent PT Goal Formulation: With patient/family Time For Goal Achievement: 09/15/23 Potential to Achieve Goals: Good Progress towards PT goals: Progressing toward goals    Frequency    Min 1X/week      PT Plan      Co-evaluation              AM-PAC PT "6 Clicks" Mobility   Outcome Measure  Help needed turning from your back to your side while in a flat bed without using bedrails?: A Lot Help needed moving from lying on your back to sitting on the side of a flat bed without using bedrails?: A Lot Help needed moving to and from a bed to a chair (including a wheelchair)?: A Lot Help needed standing up from a chair using your arms (e.g., wheelchair or bedside chair)?: A Lot Help needed to walk in hospital room?: Total Help needed climbing 3-5 steps with a railing? : Total 6 Click Score: 10    End of Session Equipment Utilized During  Treatment: Gait belt (LUE sling in place) Activity Tolerance: Patient limited by fatigue Patient left: in chair;with family/visitor present Nurse Communication: Mobility status PT Visit Diagnosis: Difficulty in walking, not elsewhere classified (R26.2);Unsteadiness on feet (R26.81)     Time: 4098-1191 PT Time Calculation (min) (ACUTE ONLY): 44 min  Charges:    $Therapeutic Activity: 38-52 mins PT General Charges $$ ACUTE PT VISIT: 1 Visit                     Donna Bernard, PT, MPT    Ina Homes 09/02/2023, 11:06 AM

## 2023-09-02 NOTE — Progress Notes (Signed)
[facility] PROGRESS NOTE    Progress Note  DEMERE DOTZLER  BMW:[id number] DOB: [birth year] DOA: 08/29/2023 PCP: [name], MD     Brief Narrative:   ORVILE [name] is an 89 y.o. male past medical history of coronary artery disease with history of CABG, chronic combined systolic and diastolic heart failure with a last EF of 40% in 2024, essential hypertension diabetes mellitus type 2 brought into the emergency room for complaints of generalized weakness associated with nausea for the past few days in the ED was found to have a heart rate of 30 twelve-lead EKG was done that showed complete heart block seen by cardiology started on dopamine admitted under PCCM service transferred to [facility] on 09/02/2023.   Assessment/Plan:   Symptomatic bradycardia EP was consulted status post pacemaker implantation 08/31/2023. Okay to resume Eliquis on 09/06/2023. Daughter would like to have him eval again by PT/OT.  Coronary artery disease status post CABG: Continue Imdur and statins. We are holding losartan and Lasix.  Atrial fibrillation with slow ventricular response (HCC) Eliquis on held to be resumed on 09/05/2024. [place] potassium greater than 4 magnesium greater than 2.  Diabetes mellitus type 2: Blood glucose well-controlled required minimal insulin.  Anemia of chronic renal disease: Hemoglobin stable.  Deconditioning/debilitating. PT OT consulted, will go home with home health.    DVT prophylaxis: lovenox Family Communication:daughter Status is: Inpatient Remains inpatient appropriate because: sinus brady    Code Status:     Code Status Orders  (From admission, onward)           Start     Ordered   08/29/23 1524  Full code  Continuous       Question:  By:  Answer:  Consent: discussion documented in EHR   08/29/23 1523           Code Status History     Date Active Date Inactive Code Status Order ID Comments User Context   05/10/2023 1428  05/17/2023 1749 Full Code [id number]  Emeline General, MD ED   04/10/2018 0509 04/11/2018 2234 Full Code [id number]  Dakota Pray, MD ED   05/23/2015 1031 05/25/2015 1436 Full Code [id number]  Dakota Bihari, MD Inpatient         IV Access:   Peripheral IV   Procedures and diagnostic studies:   DG Chest 2 View  Result Date: 09/01/2023 CLINICAL DATA:  Pacemaker placement. EXAM: CHEST - 2 VIEW COMPARISON:  August 29, 2023. FINDINGS: Stable cardiomediastinal silhouette. Interval placement of left-sided single lead pacemaker with lead in grossly good position in expected position of right ventricle. Status post coronary bypass graft. Minimal bibasilar subsegmental atelectasis is noted. Minimal left pleural effusion is noted. No pneumothorax is noted. IMPRESSION: Interval placement of single lead left-sided pacemaker in grossly good position. Minimal bibasilar subsegmental atelectasis with minimal left pleural effusion. Electronically Signed   By: Lupita Raider M.D.   On: 09/01/2023 10:11   EP PPM/ICD IMPLANT  Result Date: 08/31/2023 CONCLUSIONS:  1. Successful implantation of a Medtronic single-chamber pacemaker for symptomatic bradycardia due to complete heart block  2. No early apparent complications.       Lewayne Bunting, MD 08/31/2023 1:57 PM     Medical Consultants:   None.   Subjective:    Dakota [name] no complains  Objective:    Vitals:   09/01/23 1944 09/01/23 2316 09/02/23 0318 09/02/23 0610  BP:  123/76 130/69   Pulse:  70 70  Resp:  (!) 25 17   Temp: 98.7 F (37.1 C) 98.6 F (37 C) 98.4 F (36.9 C)   TempSrc: Oral Oral Oral   SpO2:  96% 97%   Weight:    45.6 kg  Height:       SpO2: 97 % O2 Flow Rate (L/min): 2 L/min   Intake/Output Summary (Last 24 hours) at 09/02/2023 0714 Last data filed at 09/02/2023 0653 Gross per 24 hour  Intake 818 ml  Output 1125 ml  Net -307 ml   Filed Weights   08/31/23 0500 09/01/23 0631 09/02/23 0610  Weight: 43.1 kg 44.1 kg  45.6 kg    Exam: General exam: In no acute distress. Respiratory system: Good air movement and clear to auscultation. Cardiovascular system: S1 & S2 heard, RRR. No JVD. Gastrointestinal system: Abdomen is nondistended, soft and nontender.  Extremities: No pedal edema. Skin: No rashes, lesions or ulcers Psychiatry: Judgement and insight appear normal. Mood & affect appropriate.    Data Reviewed:    Labs: Basic Metabolic Panel: Recent Labs  Lab 08/29/23 1218 08/29/23 1220 08/30/23 0605 08/31/23 0258 08/31/23 0830 09/01/23 0307  NA 139 141 140 137  --  135  K 4.3 4.3 3.7 3.8  --  3.8  CL 106 105 105 104  --  103  CO2 23  --  25 24  --  24  GLUCOSE 152* 148* 130* 105*  --  99  BUN 25* 32* 18 14  --  13  CREATININE 0.86 0.90 0.70 0.62  --  0.73  CALCIUM 8.6*  --  8.9 8.5*  --  8.0*  MG 2.0  --  1.9 1.6* 2.5* 2.1  PHOS  --   --   --  2.8  --  2.8   GFR Estimated Creatinine Clearance: 35.6 mL/min (by C-G formula based on SCr of 0.73 mg/dL). Liver Function Tests: Recent Labs  Lab 08/29/23 1218 08/30/23 0605  AST 28 17  ALT 21 21  ALKPHOS 46 47  BILITOT 1.9* 1.7*  PROT 6.3* 6.2*  ALBUMIN 3.6 3.6   No results for input(s): "LIPASE", "AMYLASE" in the last 168 hours. No results for input(s): "AMMONIA" in the last 168 hours. Coagulation profile Recent Labs  Lab 08/29/23 1218  INR 1.4*   COVID-19 Labs  No results for input(s): "DDIMER", "FERRITIN", "LDH", "CRP" in the last 72 hours.  Lab Results  Component Value Date   SARSCOV2NAA NEGATIVE 05/10/2023   SARSCOV2NAA NEGATIVE 11/16/2019    CBC: Recent Labs  Lab 08/29/23 1218 08/29/23 1220 08/30/23 0605 08/31/23 0258 09/01/23 0307  WBC 6.8  --  8.8 8.9 7.0  HGB 10.9* 12.9* 11.9* 11.2* 11.0*  HCT 36.1* 38.0* 38.5* 36.5* 36.0*  MCV 64.5*  --  64.0* 62.7* 62.8*  PLT 157  --  163 160 145*   Cardiac Enzymes: No results for input(s): "CKTOTAL", "CKMB", "CKMBINDEX", "TROPONINI" in the last 168 hours. BNP  (last 3 results) No results for input(s): "PROBNP" in the last 8760 hours. CBG: Recent Labs  Lab 09/01/23 0652 09/01/23 1145 09/01/23 1611 09/01/23 2109 09/02/23 0558  GLUCAP 120* 188* 78 157* 118*   D-Dimer: No results for input(s): "DDIMER" in the last 72 hours. Hgb A1c: No results for input(s): "HGBA1C" in the last 72 hours. Lipid Profile: No results for input(s): "CHOL", "HDL", "LDLCALC", "TRIG", "CHOLHDL", "LDLDIRECT" in the last 72 hours. Thyroid function studies: No results for input(s): "TSH", "T4TOTAL", "T3FREE", "THYROIDAB" in the last 72 hours.  Invalid input(s): "  FREET3" Anemia work up: No results for input(s): "VITAMINB12", "FOLATE", "FERRITIN", "TIBC", "IRON", "RETICCTPCT" in the last 72 hours. Sepsis Labs: Recent Labs  Lab 08/29/23 1218 08/30/23 0605 08/31/23 0258 09/01/23 0307  WBC 6.8 8.8 8.9 7.0   Microbiology Recent Results (from the past 240 hour(s))  MRSA Next Gen by PCR, Nasal     Status: None   Collection Time: 08/29/23  4:16 PM   Specimen: Nasal Mucosa; Nasal Swab  Result Value Ref Range Status   MRSA by PCR Next Gen NOT DETECTED NOT DETECTED Final    Comment: (NOTE) The GeneXpert MRSA Assay (FDA approved for NASAL specimens only), is one component of a comprehensive MRSA colonization surveillance program. It is not intended to diagnose MRSA infection nor to guide or monitor treatment for MRSA infections. Test performance is not FDA approved in patients less than 60 years old. Performed at [facility], [address] [place], [place] [address]   Surgical PCR screen     Status: None   Collection Time: 08/31/23  9:19 AM   Specimen: Nasal Mucosa; Nasal Swab  Result Value Ref Range Status   MRSA, PCR NEGATIVE NEGATIVE Final   Staphylococcus aureus NEGATIVE NEGATIVE Final    Comment: (NOTE) The Xpert SA Assay (FDA approved for NASAL specimens in patients 74 years of age and older), is one component of a comprehensive surveillance  program. It is not intended to diagnose infection nor to guide or monitor treatment. Performed at [facility], [address] [place], [place] [address]      Medications:    Chlorhexidine Gluconate Cloth  6 each Topical Daily   finasteride  5 mg Oral Daily   insulin aspart  0-15 Units Subcutaneous TID WC   isosorbide mononitrate  30 mg Oral Daily   ketorolac  1 drop Left Eye QID   mirabegron ER  50 mg Oral QHS   mouth rinse  15 mL Mouth Rinse 4 times per day   pravastatin  80 mg Oral q1800   prednisoLONE acetate  1 drop Left Eye QID   senna-docusate  1 tablet Oral BID   Continuous Infusions:    LOS: 4 days   Marinda Elk  [facility]  09/02/2023, 7:14 AM

## 2023-09-02 NOTE — TOC Progression Note (Signed)
Transition of Care (TOC) - Progression Note   Nurse ready for PTAR to be called. NCM called , no estimated time of pick up  Patient Details  Name: Dakota [name] MRN: [id number] Date of Birth: 1926/06/07  Transition of Care [facility]) CM/SW Contact  Jorey Dollard, Adria Devon, RN Phone Number: 09/02/2023, 3:05 PM  Clinical Narrative:       Expected Discharge Plan: Home w Home Health Services Barriers to Discharge: Barriers Resolved  Expected Discharge Plan and Services   Discharge Planning Services: CM Consult Post Acute Care Choice: Home Health Living arrangements for the past 2 months: Apartment Expected Discharge Date: 09/02/23               DME Arranged: Hospital bed DME Agency: [facility] Date DME Agency Contacted: 09/02/23 Time DME Agency Contacted: 1343 Representative spoke with at DME Agency: [facility] HH Arranged: RN, PT, OT, Nurse's Aide, Social Work [employer] Agency: [facility] Date [facility] Agency Contacted: 09/02/23 Time HH Agency Contacted: 1343 Representative spoke with at [facility] Agency: Kandee Keen   Social Determinants of Health (SDOH) Interventions SDOH Screenings   Food Insecurity: No Food Insecurity (09/01/2023)  Housing: Low Risk  (08/29/2023)  Transportation Needs: No Transportation Needs (08/29/2023)  Utilities: Not At Risk (09/01/2023)  Tobacco Use: Low Risk  (08/29/2023)    Readmission Risk Interventions    09/02/2023    1:43 PM  Readmission Risk Prevention Plan  Transportation Screening Complete  Home Care Screening Complete  Medication Review (RN CM) Complete

## 2023-09-02 NOTE — TOC CM/SW Note (Addendum)
   Durable Medical Equipment (From admission, onward)        Start     Ordered  09/02/23 1339  For home use only DME Hospital bed  Once      Question Answer Comment Length of Need Lifetime  Patient has (list medical condition): chronic combined CHF  The above medical condition requires: Patient requires the ability to reposition frequently  Bed type Semi-electric  Support Surface: Gel Overlay   Due to Chronic combined heart failure patient requires frequent changes in body position and/or has an immediate need for a change in body position in ways not feasible with an ordinary bed. Patient requires head to be elevated more than 30 degrees most of the time due to CHF.    09/02/23 1339

## 2023-09-02 NOTE — TOC Transition Note (Signed)
Transition of Care (TOC) - CM/SW Discharge Note Donn Pierini RN, BSN Transitions of Care Unit 4E- RN Case Manager See Treatment Team for direct phone #   Patient Details  Name: Dakota [name] MRN: [id number] Date of Birth: 1926-01-07  Transition of Care [facility]) CM/SW Contact:  Darrold Span, RN Phone Number: 09/02/2023, 1:43 PM   Clinical Narrative:    Pt stable for transition home today, HH orders have been placed- daughter and pt refusing SNF.   CM spoke with daughter at bedside- OT also present in room.  Discussed going home and that pt is currently a MaxAssist. Daughter is to have foot surgery soon and is currently NRB and in wheelchair- daughter discussed SNF with her dad- pt however refused SNF.  Daughter voiced she does have someone that can come assist- just not 24/7. She will see about applying for Medicaid for future PCS services if pt approved- daughter voiced that [facility] was assisting her with this.  Daughter confirmed she would like to continue services with [facility]- understanding that [facility] will not be a daily service. Requesting RN/aide/SW also be continued as well as PT/OT.   Daughter voiced pt has DME at home, however is requesting hospital bed at home- does not have a preference on provider as used [facility] in past.  Requested DME order from MD-  Referral sent to [facility] liaison for hospital bed- DME to be delivered to home.   Also discussed transportation home- for safety recommend ambulance transport as pt is max Assist and daughter unable to assist with her NRB status. Daughter agreeable to ambulance transport.   [facility] liaison notified for resumption of HH services-   TOC will arrange PTAR once pt is ready and daughter has called herself a ride home.    Final next level of care: Home w Home Health Services Barriers to Discharge: Barriers Resolved   Patient Goals and CMS Choice CMS Medicare.gov Compare Post Acute Care list provided to:: Patient Represenative (must  comment) Choice offered to / list presented to : Adult Children  Discharge Placement                 Home w/ [facility]        Discharge Plan and Services Additional resources added to the After Visit Summary for     Discharge Planning Services: CM Consult Post Acute Care Choice: Home Health          DME Arranged: Hospital bed DME Agency: [facility] Date DME Agency Contacted: 09/02/23 Time DME Agency Contacted: 1343 Representative spoke with at DME Agency: [facility] HH Arranged: RN, PT, OT, Nurse's Aide, Social Work [employer] Agency: [facility] Date [facility] Agency Contacted: 09/02/23 Time HH Agency Contacted: 1343 Representative spoke with at [facility] Agency: Kandee Keen  Social Determinants of Health (SDOH) Interventions SDOH Screenings   Food Insecurity: No Food Insecurity (09/01/2023)  Housing: Low Risk  (08/29/2023)  Transportation Needs: No Transportation Needs (08/29/2023)  Utilities: Not At Risk (09/01/2023)  Tobacco Use: Low Risk  (08/29/2023)     Readmission Risk Interventions    09/02/2023    1:43 PM  Readmission Risk Prevention Plan  Transportation Screening Complete  Home Care Screening Complete  Medication Review (RN CM) Complete

## 2023-09-02 NOTE — Progress Notes (Signed)
Occupational Therapy Treatment Patient Details Name: Dakota [name] MRN: [id number] DOB: 1926/05/11 Today's Date: 09/02/2023   History of present illness 89 y.o. male presented 08/29/23 with progressive weakness. Pt found to be bradycardic with HR in 30s. Pt underwent pacemaker placement on 11/5. PMH significant of chronic right inguinal hernia, s/p bilat inguinal hernia surgery in July., PAF on Eliquis, CAD status post CABG x 4, chronic HFrEF LVEF 30-50%, HTN, IIDM,   OT comments  OT session focused on family and pt education in safely assisting pt with ADLs and functional stand-pivot transfers with use of gait belt. OT also educated pt and daughter in positioning pt's L UE in supported elevation with elbow in extension in the bed and recliner at rest to prevent contracture in elbow with use of L UE sling when pt is participating in functional tasks with daughter reporting understanding of all training. Pt currently requiring Set up to Max assist with UB ADLs, Max to Total assist with LB ADLs, Max assist for bed mobility during/in preparation for ADLs, and Max assist for stand pivot transfer. OT also educated pt and daughter in OT recommendation for post acute short-term intensive inpatient rehab < 3 hours per day as pt planned to be discharged this day and daughter is unable to safely provide level of assist pt currently requires and reports no one else is available to provide the 24/7 supervision and physical assist pt currently requires for safety. Daughter verbalizes understanding of all education and recommendations but continues to decline post acute short-term rehab for pt. Due to this, pt will benefit from continued skilled OT services in the home to address deficits, decrease caregiver burden, increase safety and independence with functional tasks, and provide additional family education. If pt does not discharge as planned, pt will benefit from continued acute skilled OT services.       If plan is  discharge home, recommend the following:  A lot of help with bathing/dressing/bathroom;A lot of help with walking and/or transfers;Assistance with cooking/housework;Direct supervision/assist for medications management;Direct supervision/assist for financial management;Assist for transportation;Help with stairs or ramp for entrance;Assistance with feeding   Equipment Recommendations  BSC/3in1;Hospital bed    Recommendations for Other Services      Precautions / Restrictions Precautions Precautions: Fall;ICD/Pacemaker Restrictions Weight Bearing Restrictions: Yes LUE Weight Bearing: Non weight bearing Other Position/Activity Restrictions: LUE in sling       Mobility Bed Mobility Overal bed mobility: Needs Assistance Bed Mobility: Sit to Supine       Sit to supine: Max assist   General bed mobility comments: assistance required for B LE and trunk support.    Transfers Overall transfer level: Needs assistance Equipment used: None Transfers: Sit to/from Stand, Bed to chair/wheelchair/BSC Sit to Stand: Max assist (standing from recliner using R UE to push up from arm rest with L UE in sling) Stand pivot transfers: Max assist         General transfer comment: patient performed stand pivot transfer from recliner to bed. he has a tendency to want to use LUE heavily with transfers (despite sling in place). cues to maintain minimal to no weight bearing with LUE.     Balance Overall balance assessment: Needs assistance Sitting-balance support: Single extremity supported, Feet supported Sitting balance-Leahy Scale: Poor Sitting balance - Comments: posterior lean, unable to maintain midline position Postural control: Posterior lean Standing balance support: Single extremity supported Standing balance-Leahy Scale: Zero Standing balance comment: pt reliant on external support for balance  ADL either performed or assessed with clinical judgement    ADL Overall ADL's : Needs assistance/impaired                 Upper Body Dressing : Maximal assistance;Sitting   Lower Body Dressing: Total assistance;Bed level   Toilet Transfer: Maximal assistance;Stand-pivot;BSC/3in1 Toilet Transfer Details (indicate cue type and reason): simulated Toileting- Clothing Manipulation and Hygiene: Total assistance;Bed level         General ADL Comments: Pt with decreased activity tolerance and with lethergy limiting participation this session. OT educated pt and his daughter in current level of assistance needed for ADLs, recommendation for 24/7 supervision and physical assistance, and use of gait belt to increase safety with functional transfers with daughter verbalizing understanding.    Extremity/Trunk Assessment Upper Extremity Assessment Upper Extremity Assessment: Generalized weakness;LUE deficits/detail LUE Deficits / Details: pacemaker precautions in sling   Lower Extremity Assessment Lower Extremity Assessment: Defer to PT evaluation        Vision       Perception     Praxis      Cognition Arousal: Lethargic Behavior During Therapy: Flat affect Overall Cognitive Status: Difficult to assess                                 General Comments: HOH even with hearing aides. increased time required to complete all tasks with multi modal cues needed        Exercises      Shoulder Instructions       General Comments Pt's VSS on RA throughout session. Pt's daughter present throughout session. Case Manager present during portion of session. OT and Case Manager discussed discharge plans at length with daughter and educated her on benefits of short-term rehab versus home health and OT recommendation for short-term rehab secondary to daughter not being able to provide level care pt currently requires due to her current health condition and not having anyone else available to provide 24/7 supervision and physical  assistance. Daughter verbalized understanding of all information and continues to decline short term rehab at this time.    Pertinent Vitals/ Pain       Pain Assessment Pain Assessment: Faces Faces Pain Scale: Hurts a little bit Pain Location: generalized Pain Descriptors / Indicators: Discomfort, Grimacing Pain Intervention(s): Limited activity within patient's tolerance, Monitored during session, Repositioned  Home Living                                          Prior Functioning/Environment              Frequency  Min 1X/week        Progress Toward Goals  OT Goals(current goals can now be found in the care plan section)  Progress towards OT goals: Progressing toward goals  Acute Rehab OT Goals Patient Stated Goal: to return home  Plan      Co-evaluation                 AM-PAC OT "6 Clicks" Daily Activity     Outcome Measure   Help from another person eating meals?: A Little Help from another person taking care of personal grooming?: A Little Help from another person toileting, which includes using toliet, bedpan, or urinal?: Total Help from another person bathing (including washing, rinsing, drying)?: A  Lot Help from another person to put on and taking off regular upper body clothing?: A Lot Help from another person to put on and taking off regular lower body clothing?: A Lot 6 Click Score: 13    End of Session Equipment Utilized During Treatment: Gait belt;Other (comment) (L UE sling)  OT Visit Diagnosis: Unsteadiness on feet (R26.81);Muscle weakness (generalized) (M62.81);Other (comment) (decreased activity tolerance)   Activity Tolerance Patient tolerated treatment well;Patient limited by fatigue;Patient limited by lethargy   Patient Left in bed;with call bell/phone within reach;with family/visitor present   Nurse Communication Mobility status;Other (comment) (OT recommends short-term rehab but family declines at this time)         Time: [phone] OT Time Calculation (min): 42 min  Charges: OT General Charges $OT Visit: 1 Visit OT Treatments $Self Care/Home Management : 38-52 mins  Kennya Schwenn "Orson Eva., OTR/L, MA Acute Rehab [phone]   Lendon Colonel 09/02/2023, 1:45 PM

## 2023-09-02 NOTE — Plan of Care (Signed)
  Problem: Coping: Goal: Ability to adjust to condition or change in health will improve Outcome: Progressing   Problem: Fluid Volume: Goal: Ability to maintain a balanced intake and output will improve Outcome: Progressing   Problem: Health Behavior/Discharge Planning: Goal: Ability to identify and utilize available resources and services will improve Outcome: Progressing Goal: Ability to manage health-related needs will improve Outcome: Progressing   Problem: Health Behavior/Discharge Planning: Goal: Ability to manage health-related needs will improve Outcome: Progressing   Problem: Clinical Measurements: Goal: Ability to maintain clinical measurements within normal limits will improve Outcome: Progressing Goal: Cardiovascular complication will be avoided Outcome: Progressing

## 2023-09-06 ENCOUNTER — Encounter (INDEPENDENT_AMBULATORY_CARE_PROVIDER_SITE_OTHER): Payer: Self-pay

## 2023-09-06 ENCOUNTER — Encounter (INDEPENDENT_AMBULATORY_CARE_PROVIDER_SITE_OTHER): Payer: Medicare HMO | Admitting: Ophthalmology

## 2023-09-11 DIAGNOSIS — Z48812 Encounter for surgical aftercare following surgery on the circulatory system: Secondary | ICD-10-CM | POA: Diagnosis not present

## 2023-09-11 DIAGNOSIS — I5042 Chronic combined systolic (congestive) and diastolic (congestive) heart failure: Secondary | ICD-10-CM | POA: Diagnosis not present

## 2023-09-11 DIAGNOSIS — I4891 Unspecified atrial fibrillation: Secondary | ICD-10-CM | POA: Diagnosis not present

## 2023-09-11 DIAGNOSIS — I130 Hypertensive heart and chronic kidney disease with heart failure and stage 1 through stage 4 chronic kidney disease, or unspecified chronic kidney disease: Secondary | ICD-10-CM | POA: Diagnosis not present

## 2023-09-11 DIAGNOSIS — D631 Anemia in chronic kidney disease: Secondary | ICD-10-CM | POA: Diagnosis not present

## 2023-09-11 DIAGNOSIS — N189 Chronic kidney disease, unspecified: Secondary | ICD-10-CM | POA: Diagnosis not present

## 2023-09-11 DIAGNOSIS — I442 Atrioventricular block, complete: Secondary | ICD-10-CM | POA: Diagnosis not present

## 2023-09-11 DIAGNOSIS — I2510 Atherosclerotic heart disease of native coronary artery without angina pectoris: Secondary | ICD-10-CM | POA: Diagnosis not present

## 2023-09-11 DIAGNOSIS — E1122 Type 2 diabetes mellitus with diabetic chronic kidney disease: Secondary | ICD-10-CM | POA: Diagnosis not present

## 2023-09-13 DIAGNOSIS — N189 Chronic kidney disease, unspecified: Secondary | ICD-10-CM | POA: Diagnosis not present

## 2023-09-13 DIAGNOSIS — I442 Atrioventricular block, complete: Secondary | ICD-10-CM | POA: Diagnosis not present

## 2023-09-13 DIAGNOSIS — I130 Hypertensive heart and chronic kidney disease with heart failure and stage 1 through stage 4 chronic kidney disease, or unspecified chronic kidney disease: Secondary | ICD-10-CM | POA: Diagnosis not present

## 2023-09-13 DIAGNOSIS — D631 Anemia in chronic kidney disease: Secondary | ICD-10-CM | POA: Diagnosis not present

## 2023-09-13 DIAGNOSIS — I2510 Atherosclerotic heart disease of native coronary artery without angina pectoris: Secondary | ICD-10-CM | POA: Diagnosis not present

## 2023-09-13 DIAGNOSIS — Z48812 Encounter for surgical aftercare following surgery on the circulatory system: Secondary | ICD-10-CM | POA: Diagnosis not present

## 2023-09-13 DIAGNOSIS — I5042 Chronic combined systolic (congestive) and diastolic (congestive) heart failure: Secondary | ICD-10-CM | POA: Diagnosis not present

## 2023-09-13 DIAGNOSIS — E1122 Type 2 diabetes mellitus with diabetic chronic kidney disease: Secondary | ICD-10-CM | POA: Diagnosis not present

## 2023-09-13 DIAGNOSIS — I4891 Unspecified atrial fibrillation: Secondary | ICD-10-CM | POA: Diagnosis not present

## 2023-09-14 DIAGNOSIS — N189 Chronic kidney disease, unspecified: Secondary | ICD-10-CM | POA: Diagnosis not present

## 2023-09-14 DIAGNOSIS — D631 Anemia in chronic kidney disease: Secondary | ICD-10-CM | POA: Diagnosis not present

## 2023-09-14 DIAGNOSIS — I442 Atrioventricular block, complete: Secondary | ICD-10-CM | POA: Diagnosis not present

## 2023-09-14 DIAGNOSIS — I130 Hypertensive heart and chronic kidney disease with heart failure and stage 1 through stage 4 chronic kidney disease, or unspecified chronic kidney disease: Secondary | ICD-10-CM | POA: Diagnosis not present

## 2023-09-14 DIAGNOSIS — I5042 Chronic combined systolic (congestive) and diastolic (congestive) heart failure: Secondary | ICD-10-CM | POA: Diagnosis not present

## 2023-09-14 DIAGNOSIS — I4891 Unspecified atrial fibrillation: Secondary | ICD-10-CM | POA: Diagnosis not present

## 2023-09-14 DIAGNOSIS — E1122 Type 2 diabetes mellitus with diabetic chronic kidney disease: Secondary | ICD-10-CM | POA: Diagnosis not present

## 2023-09-14 DIAGNOSIS — I2510 Atherosclerotic heart disease of native coronary artery without angina pectoris: Secondary | ICD-10-CM | POA: Diagnosis not present

## 2023-09-14 DIAGNOSIS — Z48812 Encounter for surgical aftercare following surgery on the circulatory system: Secondary | ICD-10-CM | POA: Diagnosis not present

## 2023-09-15 ENCOUNTER — Ambulatory Visit: Payer: Medicare HMO | Attending: Cardiology

## 2023-09-15 DIAGNOSIS — I442 Atrioventricular block, complete: Secondary | ICD-10-CM

## 2023-09-15 LAB — CUP PACEART INCLINIC DEVICE CHECK
Battery Remaining Longevity: 156 mo
Brady Statistic RV Percent Paced: 78.7 %
Date Time Interrogation Session: 20241120163105
Implantable Lead Connection Status: 753985
Implantable Lead Implant Date: 20241105
Implantable Lead Location: 753860
Implantable Lead Model: 3830
Implantable Pulse Generator Implant Date: 20241105
Lead Channel Impedance Value: 703 Ohm
Lead Channel Pacing Threshold Amplitude: 0.75 V
Lead Channel Pacing Threshold Amplitude: 0.75 V
Lead Channel Pacing Threshold Amplitude: 9.1 V
Lead Channel Pacing Threshold Pulse Width: 0.5 ms
Lead Channel Pacing Threshold Pulse Width: 0.5 ms
Lead Channel Sensing Intrinsic Amplitude: 11.3 mV
Lead Channel Setting Pacing Amplitude: 3.5 V
Lead Channel Setting Pacing Pulse Width: 0.5 ms

## 2023-09-15 NOTE — Patient Instructions (Signed)
   After Your Pacemaker   Monitor your pacemaker site for redness, swelling, and drainage. Call the device clinic at [phone] if you experience these symptoms or fever/chills.  Gently wash your incision with warm water and non-scented soap in the shower. Avoid any lotions, colognes, or scented creams over the incision site.  You may use a hot tub or a pool after your wound check appointment if the incision is completely closed.  Do not lift, push or pull greater than 10 pounds with the affected arm until 6 weeks after your procedure or UNTIL AFTER DECEMBER 17th. There are no other restrictions in arm movement after your wound check appointment.  You may drive, unless driving has been restricted by your healthcare providers.  Remote monitoring is used to monitor your pacemaker from home. This monitoring is scheduled every 91 days by our office. It allows [place] to keep an eye on the functioning of your device to ensure it is working properly. You will routinely see your Electrophysiologist annually (more often if necessary).

## 2023-09-15 NOTE — Progress Notes (Signed)
Wound check appointment. Steri-strips removed without complication and patient tolerated well. Wound without redness or edema. Incision edges approximated. RN did note one area of concern on the incision, which was thought to be a scab but did not come off during bandage removal. Dr. Lalla Brothers came to assess the site and requested that the patient return in one week for a wound recheck.  Normal device function. Thresholds, sensing, and impedances consistent with implant measurements. Device programmed at 3.5V/auto capture programmed on for extra safety margin until 3 month visit. Histogram distribution appropriate for patient and level of activity. No mode switches or high ventricular rates noted. Patient educated about wound care, arm mobility, lifting restrictions. ROV in 1 week for wound recheck and also at 3 months with implanting physician for device check.

## 2023-09-17 DIAGNOSIS — I442 Atrioventricular block, complete: Secondary | ICD-10-CM | POA: Diagnosis not present

## 2023-09-17 DIAGNOSIS — E1122 Type 2 diabetes mellitus with diabetic chronic kidney disease: Secondary | ICD-10-CM | POA: Diagnosis not present

## 2023-09-17 DIAGNOSIS — I5042 Chronic combined systolic (congestive) and diastolic (congestive) heart failure: Secondary | ICD-10-CM | POA: Diagnosis not present

## 2023-09-17 DIAGNOSIS — I4891 Unspecified atrial fibrillation: Secondary | ICD-10-CM | POA: Diagnosis not present

## 2023-09-17 DIAGNOSIS — I2510 Atherosclerotic heart disease of native coronary artery without angina pectoris: Secondary | ICD-10-CM | POA: Diagnosis not present

## 2023-09-17 DIAGNOSIS — Z48812 Encounter for surgical aftercare following surgery on the circulatory system: Secondary | ICD-10-CM | POA: Diagnosis not present

## 2023-09-17 DIAGNOSIS — D631 Anemia in chronic kidney disease: Secondary | ICD-10-CM | POA: Diagnosis not present

## 2023-09-17 DIAGNOSIS — N189 Chronic kidney disease, unspecified: Secondary | ICD-10-CM | POA: Diagnosis not present

## 2023-09-17 DIAGNOSIS — I130 Hypertensive heart and chronic kidney disease with heart failure and stage 1 through stage 4 chronic kidney disease, or unspecified chronic kidney disease: Secondary | ICD-10-CM | POA: Diagnosis not present

## 2023-09-17 MED ORDER — APIXABAN 2.5 MG PO TABS: 2.5000 mg | ORAL_TABLET | Freq: Two times a day (BID) | ORAL | 0 refills | Status: AC

## 2023-09-17 NOTE — Telephone Encounter (Signed)
Ok to give samples of Eliquis 2.5 mg x 2 weeks, if available

## 2023-09-17 NOTE — Telephone Encounter (Signed)
Patient identification verified by 2 forms. Marilynn Rail, RN   Called and spoke to patients daughter Oneita Kras  Informed Nadeema samples for Eliquis 2.5mg  will be available for pick up  Nadeema verbalized understanding, no questions at this time

## 2023-09-17 NOTE — Addendum Note (Signed)
Addended by: Marilynn Rail on: 09/17/2023 04:01 PM   Modules accepted: Orders

## 2023-09-18 DIAGNOSIS — E1122 Type 2 diabetes mellitus with diabetic chronic kidney disease: Secondary | ICD-10-CM | POA: Diagnosis not present

## 2023-09-18 DIAGNOSIS — I130 Hypertensive heart and chronic kidney disease with heart failure and stage 1 through stage 4 chronic kidney disease, or unspecified chronic kidney disease: Secondary | ICD-10-CM | POA: Diagnosis not present

## 2023-09-18 DIAGNOSIS — I442 Atrioventricular block, complete: Secondary | ICD-10-CM | POA: Diagnosis not present

## 2023-09-18 DIAGNOSIS — I4891 Unspecified atrial fibrillation: Secondary | ICD-10-CM | POA: Diagnosis not present

## 2023-09-18 DIAGNOSIS — I2510 Atherosclerotic heart disease of native coronary artery without angina pectoris: Secondary | ICD-10-CM | POA: Diagnosis not present

## 2023-09-18 DIAGNOSIS — I5042 Chronic combined systolic (congestive) and diastolic (congestive) heart failure: Secondary | ICD-10-CM | POA: Diagnosis not present

## 2023-09-18 DIAGNOSIS — D631 Anemia in chronic kidney disease: Secondary | ICD-10-CM | POA: Diagnosis not present

## 2023-09-18 DIAGNOSIS — N189 Chronic kidney disease, unspecified: Secondary | ICD-10-CM | POA: Diagnosis not present

## 2023-09-18 DIAGNOSIS — Z48812 Encounter for surgical aftercare following surgery on the circulatory system: Secondary | ICD-10-CM | POA: Diagnosis not present

## 2023-09-20 ENCOUNTER — Other Ambulatory Visit (HOSPITAL_COMMUNITY): Payer: Self-pay

## 2023-09-20 DIAGNOSIS — I5042 Chronic combined systolic (congestive) and diastolic (congestive) heart failure: Secondary | ICD-10-CM | POA: Diagnosis not present

## 2023-09-20 DIAGNOSIS — I4891 Unspecified atrial fibrillation: Secondary | ICD-10-CM | POA: Diagnosis not present

## 2023-09-20 DIAGNOSIS — D631 Anemia in chronic kidney disease: Secondary | ICD-10-CM | POA: Diagnosis not present

## 2023-09-20 DIAGNOSIS — N189 Chronic kidney disease, unspecified: Secondary | ICD-10-CM | POA: Diagnosis not present

## 2023-09-20 DIAGNOSIS — I442 Atrioventricular block, complete: Secondary | ICD-10-CM | POA: Diagnosis not present

## 2023-09-20 DIAGNOSIS — E1122 Type 2 diabetes mellitus with diabetic chronic kidney disease: Secondary | ICD-10-CM | POA: Diagnosis not present

## 2023-09-20 DIAGNOSIS — I2510 Atherosclerotic heart disease of native coronary artery without angina pectoris: Secondary | ICD-10-CM | POA: Diagnosis not present

## 2023-09-20 DIAGNOSIS — I130 Hypertensive heart and chronic kidney disease with heart failure and stage 1 through stage 4 chronic kidney disease, or unspecified chronic kidney disease: Secondary | ICD-10-CM | POA: Diagnosis not present

## 2023-09-20 DIAGNOSIS — Z48812 Encounter for surgical aftercare following surgery on the circulatory system: Secondary | ICD-10-CM | POA: Diagnosis not present

## 2023-09-22 ENCOUNTER — Ambulatory Visit: Payer: Medicare HMO | Attending: Internal Medicine

## 2023-09-22 DIAGNOSIS — I5022 Chronic systolic (congestive) heart failure: Secondary | ICD-10-CM

## 2023-09-22 DIAGNOSIS — I442 Atrioventricular block, complete: Secondary | ICD-10-CM

## 2023-09-22 MED ORDER — CEPHALEXIN 500 MG PO CAPS: 500.0000 mg | ORAL_CAPSULE | Freq: Two times a day (BID) | ORAL | 0 refills | Status: DC

## 2023-09-22 NOTE — Progress Notes (Signed)
Pt seen in device clinic for follow up for wound recheck.  Pt with stitch noted at medial incision site.  This was evaluated and removed by Dr. Ladona Ridgel.  Pt had resumed Eliquis post implant and now with hematoma noted over pacemaker site.  Pt will stop Eliquis until follow up appointment.  Per Dr. Erline Hau Eliquis until follow up in 7 days.  Start Keflex 500 mg PO BID x 7 days.  Pt scheduled for wound recheck in 7 days.  Pt/family advised to call if any change in condition prior to follow up.

## 2023-09-22 NOTE — Patient Instructions (Addendum)
STOP taking Eliquis until your next appointment.  2.    START taking Keflex 500 mg-  Take one tablet by mouth TWICE a day x 7 days  3.   FOLLOW UP:    September 29, 2023 at 1:00 pm at the [facility] office

## 2023-09-24 DIAGNOSIS — I4891 Unspecified atrial fibrillation: Secondary | ICD-10-CM | POA: Diagnosis not present

## 2023-09-24 DIAGNOSIS — I442 Atrioventricular block, complete: Secondary | ICD-10-CM | POA: Diagnosis not present

## 2023-09-24 DIAGNOSIS — N189 Chronic kidney disease, unspecified: Secondary | ICD-10-CM | POA: Diagnosis not present

## 2023-09-24 DIAGNOSIS — I130 Hypertensive heart and chronic kidney disease with heart failure and stage 1 through stage 4 chronic kidney disease, or unspecified chronic kidney disease: Secondary | ICD-10-CM | POA: Diagnosis not present

## 2023-09-24 DIAGNOSIS — E1122 Type 2 diabetes mellitus with diabetic chronic kidney disease: Secondary | ICD-10-CM | POA: Diagnosis not present

## 2023-09-24 DIAGNOSIS — D631 Anemia in chronic kidney disease: Secondary | ICD-10-CM | POA: Diagnosis not present

## 2023-09-24 DIAGNOSIS — Z48812 Encounter for surgical aftercare following surgery on the circulatory system: Secondary | ICD-10-CM | POA: Diagnosis not present

## 2023-09-24 DIAGNOSIS — I2510 Atherosclerotic heart disease of native coronary artery without angina pectoris: Secondary | ICD-10-CM | POA: Diagnosis not present

## 2023-09-24 DIAGNOSIS — I5042 Chronic combined systolic (congestive) and diastolic (congestive) heart failure: Secondary | ICD-10-CM | POA: Diagnosis not present

## 2023-09-27 DIAGNOSIS — I4891 Unspecified atrial fibrillation: Secondary | ICD-10-CM | POA: Diagnosis not present

## 2023-09-27 DIAGNOSIS — D631 Anemia in chronic kidney disease: Secondary | ICD-10-CM | POA: Diagnosis not present

## 2023-09-27 DIAGNOSIS — Z48812 Encounter for surgical aftercare following surgery on the circulatory system: Secondary | ICD-10-CM | POA: Diagnosis not present

## 2023-09-27 DIAGNOSIS — I5042 Chronic combined systolic (congestive) and diastolic (congestive) heart failure: Secondary | ICD-10-CM | POA: Diagnosis not present

## 2023-09-27 DIAGNOSIS — E1122 Type 2 diabetes mellitus with diabetic chronic kidney disease: Secondary | ICD-10-CM | POA: Diagnosis not present

## 2023-09-27 DIAGNOSIS — I2510 Atherosclerotic heart disease of native coronary artery without angina pectoris: Secondary | ICD-10-CM | POA: Diagnosis not present

## 2023-09-27 DIAGNOSIS — I442 Atrioventricular block, complete: Secondary | ICD-10-CM | POA: Diagnosis not present

## 2023-09-27 DIAGNOSIS — I130 Hypertensive heart and chronic kidney disease with heart failure and stage 1 through stage 4 chronic kidney disease, or unspecified chronic kidney disease: Secondary | ICD-10-CM | POA: Diagnosis not present

## 2023-09-27 DIAGNOSIS — N189 Chronic kidney disease, unspecified: Secondary | ICD-10-CM | POA: Diagnosis not present

## 2023-09-29 ENCOUNTER — Ambulatory Visit: Payer: Medicare HMO | Attending: Internal Medicine

## 2023-09-29 DIAGNOSIS — I442 Atrioventricular block, complete: Secondary | ICD-10-CM

## 2023-09-29 NOTE — Patient Instructions (Signed)
Restart Eliquis.  Physical therapy-please begin ROM exercises with left arm.  No weights added to left arm exercises until October 16, 2023.  After October 16, 2023 patient may exercise left arm with weights.  Continue to monitor incision site.  If you notice NEW bleeding or any drainage or redness or increased swelling please contact Device clinic at [phone].    Please have Dr. Tresa Endo evaluate wound site at upcoming appointment.  He will contact us if any concerns.

## 2023-09-30 ENCOUNTER — Other Ambulatory Visit: Payer: Self-pay | Admitting: Cardiovascular Disease

## 2023-09-30 DIAGNOSIS — E1122 Type 2 diabetes mellitus with diabetic chronic kidney disease: Secondary | ICD-10-CM | POA: Diagnosis not present

## 2023-09-30 DIAGNOSIS — D631 Anemia in chronic kidney disease: Secondary | ICD-10-CM | POA: Diagnosis not present

## 2023-09-30 DIAGNOSIS — Z48812 Encounter for surgical aftercare following surgery on the circulatory system: Secondary | ICD-10-CM | POA: Diagnosis not present

## 2023-09-30 DIAGNOSIS — N189 Chronic kidney disease, unspecified: Secondary | ICD-10-CM | POA: Diagnosis not present

## 2023-09-30 DIAGNOSIS — I5042 Chronic combined systolic (congestive) and diastolic (congestive) heart failure: Secondary | ICD-10-CM | POA: Diagnosis not present

## 2023-09-30 DIAGNOSIS — I442 Atrioventricular block, complete: Secondary | ICD-10-CM | POA: Diagnosis not present

## 2023-09-30 DIAGNOSIS — I4891 Unspecified atrial fibrillation: Secondary | ICD-10-CM | POA: Diagnosis not present

## 2023-09-30 DIAGNOSIS — I130 Hypertensive heart and chronic kidney disease with heart failure and stage 1 through stage 4 chronic kidney disease, or unspecified chronic kidney disease: Secondary | ICD-10-CM | POA: Diagnosis not present

## 2023-09-30 DIAGNOSIS — I5043 Acute on chronic combined systolic (congestive) and diastolic (congestive) heart failure: Secondary | ICD-10-CM

## 2023-09-30 DIAGNOSIS — I2510 Atherosclerotic heart disease of native coronary artery without angina pectoris: Secondary | ICD-10-CM | POA: Diagnosis not present

## 2023-09-30 NOTE — Progress Notes (Signed)
Patient seen in device clinic for follow up.  Patient seen last week for stitch noted at incision site.  During evaluation it was noted that patient appeared to have new bleeding in the pocket.  Stitch was removed at that time by Dr. Ladona Ridgel.  Patient was advised to hold Eliquis and take one week course of antibiotics and follow up for reevaluation in one week.  Today patient's hematoma over pacemaker appears improved.  Hematoma over pocket is now a green color indicating resolution.  No swelling noted.  There is a scab located where stitch was previously removed.  Site was assessed with Dr. Graciela Husbands.  Will leave scab in place at this time and have family continue to monitor.  Advised daughter to contact office if she notices any s/s of infection or if hematoma does not continue to resolve.  Pt will resume Eliquis at this time.  Pt has follow up with Dr. Tresa Endo October 11, 2023-will ask Dr. Tresa Endo to check site and reach out with any concerns.

## 2023-10-02 ENCOUNTER — Other Ambulatory Visit: Payer: Self-pay

## 2023-10-02 ENCOUNTER — Emergency Department (HOSPITAL_COMMUNITY): Payer: Medicare HMO

## 2023-10-02 ENCOUNTER — Inpatient Hospital Stay (HOSPITAL_COMMUNITY)
Admission: EM | Admit: 2023-10-02 | Discharge: 2023-10-08 | DRG: 640 | Disposition: A | Discharge status: Skilled Nursing Facility | Payer: Medicare HMO | Attending: Internal Medicine | Admitting: Internal Medicine

## 2023-10-02 DIAGNOSIS — D638 Anemia in other chronic diseases classified elsewhere: Secondary | ICD-10-CM | POA: Diagnosis present

## 2023-10-02 DIAGNOSIS — I5022 Chronic systolic (congestive) heart failure: Secondary | ICD-10-CM | POA: Diagnosis not present

## 2023-10-02 DIAGNOSIS — J449 Chronic obstructive pulmonary disease, unspecified: Secondary | ICD-10-CM | POA: Diagnosis present

## 2023-10-02 DIAGNOSIS — R197 Diarrhea, unspecified: Secondary | ICD-10-CM | POA: Diagnosis present

## 2023-10-02 DIAGNOSIS — A419 Sepsis, unspecified organism: Secondary | ICD-10-CM | POA: Diagnosis present

## 2023-10-02 DIAGNOSIS — K2970 Gastritis, unspecified, without bleeding: Secondary | ICD-10-CM | POA: Diagnosis present

## 2023-10-02 DIAGNOSIS — Z7401 Bed confinement status: Secondary | ICD-10-CM | POA: Diagnosis not present

## 2023-10-02 DIAGNOSIS — I4891 Unspecified atrial fibrillation: Secondary | ICD-10-CM

## 2023-10-02 DIAGNOSIS — E7800 Pure hypercholesterolemia, unspecified: Secondary | ICD-10-CM

## 2023-10-02 DIAGNOSIS — K219 Gastro-esophageal reflux disease without esophagitis: Secondary | ICD-10-CM | POA: Diagnosis present

## 2023-10-02 DIAGNOSIS — Z823 Family history of stroke: Secondary | ICD-10-CM

## 2023-10-02 DIAGNOSIS — I25709 Atherosclerosis of coronary artery bypass graft(s), unspecified, with unspecified angina pectoris: Secondary | ICD-10-CM | POA: Diagnosis not present

## 2023-10-02 DIAGNOSIS — R64 Cachexia: Secondary | ICD-10-CM | POA: Diagnosis present

## 2023-10-02 DIAGNOSIS — E1169 Type 2 diabetes mellitus with other specified complication: Secondary | ICD-10-CM

## 2023-10-02 DIAGNOSIS — N400 Enlarged prostate without lower urinary tract symptoms: Secondary | ICD-10-CM | POA: Diagnosis present

## 2023-10-02 DIAGNOSIS — Z9841 Cataract extraction status, right eye: Secondary | ICD-10-CM

## 2023-10-02 DIAGNOSIS — Z9842 Cataract extraction status, left eye: Secondary | ICD-10-CM

## 2023-10-02 DIAGNOSIS — Z882 Allergy status to sulfonamides status: Secondary | ICD-10-CM

## 2023-10-02 DIAGNOSIS — Z950 Presence of cardiac pacemaker: Secondary | ICD-10-CM | POA: Diagnosis not present

## 2023-10-02 DIAGNOSIS — Q250 Patent ductus arteriosus: Secondary | ICD-10-CM | POA: Diagnosis not present

## 2023-10-02 DIAGNOSIS — I2510 Atherosclerotic heart disease of native coronary artery without angina pectoris: Secondary | ICD-10-CM | POA: Diagnosis not present

## 2023-10-02 DIAGNOSIS — I700 Atherosclerosis of aorta: Secondary | ICD-10-CM | POA: Diagnosis present

## 2023-10-02 DIAGNOSIS — I442 Atrioventricular block, complete: Secondary | ICD-10-CM | POA: Diagnosis not present

## 2023-10-02 DIAGNOSIS — G8929 Other chronic pain: Secondary | ICD-10-CM | POA: Diagnosis present

## 2023-10-02 DIAGNOSIS — I10 Essential (primary) hypertension: Secondary | ICD-10-CM

## 2023-10-02 DIAGNOSIS — E119 Type 2 diabetes mellitus without complications: Secondary | ICD-10-CM

## 2023-10-02 DIAGNOSIS — M6281 Muscle weakness (generalized): Secondary | ICD-10-CM | POA: Diagnosis not present

## 2023-10-02 DIAGNOSIS — R0689 Other abnormalities of breathing: Secondary | ICD-10-CM | POA: Diagnosis not present

## 2023-10-02 DIAGNOSIS — R54 Age-related physical debility: Secondary | ICD-10-CM | POA: Diagnosis present

## 2023-10-02 DIAGNOSIS — I25810 Atherosclerosis of coronary artery bypass graft(s) without angina pectoris: Secondary | ICD-10-CM | POA: Diagnosis present

## 2023-10-02 DIAGNOSIS — Z888 Allergy status to other drugs, medicaments and biological substances status: Secondary | ICD-10-CM

## 2023-10-02 DIAGNOSIS — E860 Dehydration: Secondary | ICD-10-CM | POA: Diagnosis present

## 2023-10-02 DIAGNOSIS — Z515 Encounter for palliative care: Secondary | ICD-10-CM

## 2023-10-02 DIAGNOSIS — R4182 Altered mental status, unspecified: Secondary | ICD-10-CM | POA: Diagnosis not present

## 2023-10-02 DIAGNOSIS — I110 Hypertensive heart disease with heart failure: Secondary | ICD-10-CM | POA: Diagnosis present

## 2023-10-02 DIAGNOSIS — I083 Combined rheumatic disorders of mitral, aortic and tricuspid valves: Secondary | ICD-10-CM | POA: Diagnosis present

## 2023-10-02 DIAGNOSIS — E1122 Type 2 diabetes mellitus with diabetic chronic kidney disease: Secondary | ICD-10-CM | POA: Diagnosis not present

## 2023-10-02 DIAGNOSIS — R5381 Other malaise: Secondary | ICD-10-CM

## 2023-10-02 DIAGNOSIS — E872 Acidosis, unspecified: Principal | ICD-10-CM | POA: Diagnosis present

## 2023-10-02 DIAGNOSIS — N189 Chronic kidney disease, unspecified: Secondary | ICD-10-CM | POA: Diagnosis not present

## 2023-10-02 DIAGNOSIS — Z48812 Encounter for surgical aftercare following surgery on the circulatory system: Secondary | ICD-10-CM | POA: Diagnosis not present

## 2023-10-02 DIAGNOSIS — D649 Anemia, unspecified: Secondary | ICD-10-CM

## 2023-10-02 DIAGNOSIS — E871 Hypo-osmolality and hyponatremia: Secondary | ICD-10-CM | POA: Diagnosis present

## 2023-10-02 DIAGNOSIS — Z974 Presence of external hearing-aid: Secondary | ICD-10-CM

## 2023-10-02 DIAGNOSIS — R2689 Other abnormalities of gait and mobility: Secondary | ICD-10-CM | POA: Diagnosis not present

## 2023-10-02 DIAGNOSIS — I959 Hypotension, unspecified: Secondary | ICD-10-CM | POA: Diagnosis present

## 2023-10-02 DIAGNOSIS — N281 Cyst of kidney, acquired: Secondary | ICD-10-CM | POA: Diagnosis not present

## 2023-10-02 DIAGNOSIS — Z681 Body mass index (BMI) 19 or less, adult: Secondary | ICD-10-CM | POA: Diagnosis not present

## 2023-10-02 DIAGNOSIS — I5042 Chronic combined systolic (congestive) and diastolic (congestive) heart failure: Secondary | ICD-10-CM | POA: Diagnosis present

## 2023-10-02 DIAGNOSIS — R531 Weakness: Secondary | ICD-10-CM | POA: Diagnosis not present

## 2023-10-02 DIAGNOSIS — L89312 Pressure ulcer of right buttock, stage 2: Secondary | ICD-10-CM | POA: Diagnosis present

## 2023-10-02 DIAGNOSIS — Z7984 Long term (current) use of oral hypoglycemic drugs: Secondary | ICD-10-CM

## 2023-10-02 DIAGNOSIS — E43 Unspecified severe protein-calorie malnutrition: Secondary | ICD-10-CM | POA: Diagnosis present

## 2023-10-02 DIAGNOSIS — Z7901 Long term (current) use of anticoagulants: Secondary | ICD-10-CM

## 2023-10-02 DIAGNOSIS — Z91014 Allergy to mammalian meats: Secondary | ICD-10-CM

## 2023-10-02 DIAGNOSIS — D631 Anemia in chronic kidney disease: Secondary | ICD-10-CM | POA: Diagnosis not present

## 2023-10-02 DIAGNOSIS — Z955 Presence of coronary angioplasty implant and graft: Secondary | ICD-10-CM

## 2023-10-02 DIAGNOSIS — I080 Rheumatic disorders of both mitral and aortic valves: Secondary | ICD-10-CM | POA: Diagnosis present

## 2023-10-02 DIAGNOSIS — R1310 Dysphagia, unspecified: Secondary | ICD-10-CM | POA: Diagnosis not present

## 2023-10-02 DIAGNOSIS — L89322 Pressure ulcer of left buttock, stage 2: Secondary | ICD-10-CM | POA: Diagnosis present

## 2023-10-02 DIAGNOSIS — L89323 Pressure ulcer of left buttock, stage 3: Secondary | ICD-10-CM | POA: Diagnosis present

## 2023-10-02 DIAGNOSIS — I358 Other nonrheumatic aortic valve disorders: Secondary | ICD-10-CM | POA: Diagnosis not present

## 2023-10-02 DIAGNOSIS — Z79899 Other long term (current) drug therapy: Secondary | ICD-10-CM

## 2023-10-02 DIAGNOSIS — K4090 Unilateral inguinal hernia, without obstruction or gangrene, not specified as recurrent: Secondary | ICD-10-CM | POA: Diagnosis present

## 2023-10-02 DIAGNOSIS — D563 Thalassemia minor: Secondary | ICD-10-CM | POA: Diagnosis present

## 2023-10-02 DIAGNOSIS — R41841 Cognitive communication deficit: Secondary | ICD-10-CM | POA: Diagnosis not present

## 2023-10-02 DIAGNOSIS — I440 Atrioventricular block, first degree: Secondary | ICD-10-CM | POA: Diagnosis present

## 2023-10-02 DIAGNOSIS — I4821 Permanent atrial fibrillation: Secondary | ICD-10-CM | POA: Diagnosis present

## 2023-10-02 DIAGNOSIS — I130 Hypertensive heart and chronic kidney disease with heart failure and stage 1 through stage 4 chronic kidney disease, or unspecified chronic kidney disease: Secondary | ICD-10-CM | POA: Diagnosis not present

## 2023-10-02 DIAGNOSIS — I482 Chronic atrial fibrillation, unspecified: Secondary | ICD-10-CM | POA: Diagnosis present

## 2023-10-02 DIAGNOSIS — I2582 Chronic total occlusion of coronary artery: Secondary | ICD-10-CM | POA: Diagnosis present

## 2023-10-02 DIAGNOSIS — Z961 Presence of intraocular lens: Secondary | ICD-10-CM | POA: Diagnosis present

## 2023-10-02 DIAGNOSIS — Z87440 Personal history of urinary (tract) infections: Secondary | ICD-10-CM

## 2023-10-02 DIAGNOSIS — K319 Disease of stomach and duodenum, unspecified: Secondary | ICD-10-CM | POA: Diagnosis present

## 2023-10-02 LAB — CBC WITH DIFFERENTIAL/PLATELET
Abs Immature Granulocytes: 0 10*3/uL (ref 0.00–0.07)
Basophils Absolute: 0 10*3/uL (ref 0.0–0.1)
Basophils Relative: 0 %
Eosinophils Absolute: 0.1 10*3/uL (ref 0.0–0.5)
Eosinophils Relative: 1 %
HCT: 32.4 % — ABNORMAL LOW (ref 39.0–52.0)
Hemoglobin: 10.1 g/dL — ABNORMAL LOW (ref 13.0–17.0)
Lymphocytes Relative: 24 %
Lymphs Abs: 1.4 10*3/uL (ref 0.7–4.0)
MCH: 19.5 pg — ABNORMAL LOW (ref 26.0–34.0)
MCHC: 31.2 g/dL (ref 30.0–36.0)
MCV: 62.4 fL — ABNORMAL LOW (ref 80.0–100.0)
Monocytes Absolute: 0.1 10*3/uL (ref 0.1–1.0)
Monocytes Relative: 1 %
Neutro Abs: 4.2 10*3/uL (ref 1.7–7.7)
Neutrophils Relative %: 74 %
Platelets: 175 10*3/uL (ref 150–400)
RBC: 5.19 MIL/uL (ref 4.22–5.81)
RDW: 16 % — ABNORMAL HIGH (ref 11.5–15.5)
WBC: 5.7 10*3/uL (ref 4.0–10.5)
nRBC: 0 % (ref 0.0–0.2)
nRBC: 0 /100{WBCs}

## 2023-10-02 LAB — LACTIC ACID, PLASMA: Lactic Acid, Venous: 2.5 mmol/L (ref 0.5–1.9)

## 2023-10-02 LAB — COMPREHENSIVE METABOLIC PANEL
ALT: 16 U/L (ref 0–44)
AST: 25 U/L (ref 15–41)
Albumin: 3.2 g/dL — ABNORMAL LOW (ref 3.5–5.0)
Alkaline Phosphatase: 49 U/L (ref 38–126)
Anion gap: 11 (ref 5–15)
BUN: 16 mg/dL (ref 8–23)
CO2: 23 mmol/L (ref 22–32)
Calcium: 8.6 mg/dL — ABNORMAL LOW (ref 8.9–10.3)
Chloride: 97 mmol/L — ABNORMAL LOW (ref 98–111)
Creatinine, Ser: 0.6 mg/dL — ABNORMAL LOW (ref 0.61–1.24)
GFR, Estimated: >60 mL/min (ref >60)
Glucose, Bld: 182 mg/dL — ABNORMAL HIGH (ref 70–99)
Potassium: 3.8 mmol/L (ref 3.5–5.1)
Sodium: 131 mmol/L — ABNORMAL LOW (ref 135–145)
Total Bilirubin: 0.9 mg/dL (ref <1.2)
Total Protein: 5.8 g/dL — ABNORMAL LOW (ref 6.5–8.1)

## 2023-10-02 LAB — URINALYSIS, ROUTINE W REFLEX MICROSCOPIC
Bilirubin Urine: NEGATIVE
Glucose, UA: NEGATIVE mg/dL
Hgb urine dipstick: NEGATIVE
Ketones, ur: NEGATIVE mg/dL
Leukocytes,Ua: NEGATIVE
Nitrite: NEGATIVE
Protein, ur: NEGATIVE mg/dL
Specific Gravity, Urine: 1.01 (ref 1.005–1.030)
pH: 7 (ref 5.0–8.0)

## 2023-10-02 MED ORDER — IOHEXOL 350 MG/ML SOLN
75.0000 mL | Freq: Once | INTRAVENOUS | Status: AC | PRN
  Administered 2023-10-02: 75 mL via INTRAVENOUS

## 2023-10-02 MED ORDER — SODIUM CHLORIDE 0.9 % IV BOLUS
1000.0000 mL | Freq: Once | INTRAVENOUS | Status: AC
  Administered 2023-10-02: 1000 mL via INTRAVENOUS

## 2023-10-02 MED ORDER — PANTOPRAZOLE SODIUM 40 MG IV SOLR
40.0000 mg | Freq: Every day | INTRAVENOUS | Status: DC
  Administered 2023-10-03 – 2023-10-04 (×3): 40 mg via INTRAVENOUS
  Filled 2023-10-02 (×3): qty 10

## 2023-10-02 MED ORDER — INSULIN ASPART 100 UNIT/ML IJ SOLN
0.0000 [IU] | INTRAMUSCULAR | Status: DC
Start: 2023-10-03 — End: 2023-10-08
  Administered 2023-10-03: 7 [IU] via SUBCUTANEOUS
  Administered 2023-10-03 (×2): 3 [IU] via SUBCUTANEOUS
  Administered 2023-10-04 (×2): 1 [IU] via SUBCUTANEOUS
  Administered 2023-10-04: 2 [IU] via SUBCUTANEOUS
  Administered 2023-10-04: 1 [IU] via SUBCUTANEOUS
  Administered 2023-10-05: 3 [IU] via SUBCUTANEOUS
  Administered 2023-10-05: 1 [IU] via SUBCUTANEOUS
  Administered 2023-10-06: 3 [IU] via SUBCUTANEOUS
  Administered 2023-10-06: 1 [IU] via SUBCUTANEOUS
  Administered 2023-10-06 (×3): 2 [IU] via SUBCUTANEOUS
  Administered 2023-10-06: 3 [IU] via SUBCUTANEOUS
  Administered 2023-10-07 – 2023-10-08 (×4): 2 [IU] via SUBCUTANEOUS

## 2023-10-02 NOTE — Assessment & Plan Note (Signed)
Would benefit from PT OT evaluation prior to discharge

## 2023-10-02 NOTE — Assessment & Plan Note (Signed)
Gently rehydrate

## 2023-10-02 NOTE — Assessment & Plan Note (Signed)
Currently appears to be on a dry side we will gently rehydrate

## 2023-10-02 NOTE — ED Provider Notes (Signed)
[place] EMERGENCY DEPARTMENT AT [facility] Provider Note   CSN: [id number] Arrival date & time: 10/02/23  1216     History  Chief Complaint  Patient presents with   Weakness    Dakota [name] is a 89 y.o. male.  Pt is a 89 yo male with pmhx significant for cad, htn, gerd, copd, dm2, chf, hoh, and afib (on Eliquis). EMS was called today due to increased weakness and diarrhea.  Pt was admitted from 11/3-7 for complete heart block and had a pacemaker placed on 11/5.  He was seen on 11/27 for a wound recheck.  A stitch was removed and pt was noted to have a hematoma.  He was started on keflex for a week and Eliquis was held.  On 12/4, he was seen again and hematoma improved and there were no signs of infection.  Pt resumed eliquis.  Pt has since developed severe diarrhea and he's been very weak.  No fevers.  Pt is not participating in history.  Pt's daughter is here now.  When he was admitted the last time, it looks like rehab was recommended.  She did not understand that was temporary.  She is interested in temporary rehab.  She is not interested in hospice.  She is interested in home health when pt comes home from rehab.  She very much wants to keep taking care of her father.          Home Medications Prior to Admission medications   Medication Sig Start Date End Date Taking? Authorizing Provider  acetaminophen (TYLENOL) 500 MG tablet Take 1 tablet (500 mg total) by mouth every 8 (eight) hours as needed for moderate pain. 05/17/23 05/16/24  Leroy Sea, MD  albuterol (PROVENTIL HFA;VENTOLIN HFA) 108 (90 Base) MCG/ACT inhaler Inhale 2 puffs into the lungs every 4 (four) hours as needed for wheezing or shortness of breath. 01/03/16   Charm Rings, MD  apixaban (ELIQUIS) 2.5 MG TABS tablet Take 1 tablet (2.5 mg total) by mouth 2 (two) times daily. 09/06/23   Marinda Elk, MD  apixaban (ELIQUIS) 2.5 MG TABS tablet Take 1 tablet (2.5 mg total) by mouth 2 (two) times  daily. 09/17/23   Lennette Bihari, MD  Blood Glucose Monitoring Suppl (ONE TOUCH ULTRA 2) w/Device KIT daily in the afternoon. 11/02/22   [provider]  cephALEXin (KEFLEX) 500 MG capsule Take 1 capsule (500 mg total) by mouth 2 (two) times daily. 09/22/23   Marinus Maw, MD  docusate sodium (COLACE) 100 MG capsule Take 1 capsule (100 mg total) by mouth 2 (two) times daily 05/17/23   Leroy Sea, MD  finasteride (PROSCAR) 5 MG tablet Take 5 mg by mouth at bedtime.     [provider]  furosemide (LASIX) 20 MG tablet 20 MG EVERY 4 DAYS Patient taking differently: Take 20 mg by mouth See admin instructions. 20 MG EVERY 4 DAYS 03/06/23   Ronney Asters, NP  isosorbide mononitrate (IMDUR) 30 MG 24 hr tablet Take 1 tablet by mouth once daily 06/07/23   Ronney Asters, NP  ketorolac (ACULAR) 0.5 % ophthalmic solution Place 1 drop into the left eye 4 (four) times daily.    [provider]  losartan (COZAAR) 25 MG tablet Take 1 tablet (25 mg total) by mouth daily. 09/16/23   Marinda Elk, MD  metFORMIN (GLUCOPHAGE) 500 MG tablet Take 500 mg by mouth 2 (two) times daily.    [provider]  mirabegron ER (MYRBETRIQ) 50 MG TB24 tablet Take 50 mg by mouth at bedtime.     [provider]  Multiple Vitamin (MULTIVITAMIN WITH MINERALS) TABS tablet Take 1 tablet by mouth daily.    [provider]  Multiple Vitamins-Minerals (PRESERVISION AREDS 2 PO) Take 1 tablet by mouth 2 (two) times daily.     [provider]  NITROSTAT 0.4 MG SL tablet DISSOLVE ONE TABLET UNDER THE TONGUE EVERY 5 MINUTES AS NEEDED FOR CHEST PAIN. Patient taking differently: Place 0.4 mg under the tongue every 5 (five) minutes as needed for chest pain. 01/13/22   Lennette Bihari, MD  ONE TOUCH ULTRA TEST test strip 1 each by Other route as needed (glucose monoring).  04/21/15   [provider]  pantoprazole (PROTONIX) 40 MG tablet Take 1 tablet (40 mg total)  by mouth daily. Patient taking differently: Take 40 mg by mouth daily. 05/21/15   Tereso Newcomer T, PA-C  pravastatin (PRAVACHOL) 80 MG tablet Take 80 mg by mouth at bedtime.    [provider]  prednisoLONE acetate (PRED FORTE) 1 % ophthalmic suspension Place 1 drop into the left eye 4 (four) times daily.    [provider]  predniSONE (DELTASONE) 10 MG tablet Take 1 tablet by mouth daily.    [provider]      Allergies    Aceon [perindopril erbumine], Sulfa antibiotics, and Pork-derived products    Review of Systems   Review of Systems  Gastrointestinal:  Positive for diarrhea.  Neurological:  Positive for weakness.  All other systems reviewed and are negative.   Physical Exam Updated Vital Signs BP (!) 110/58 (BP Location: Right Arm)   Pulse 70   Temp 97.6 F (36.4 C) (Rectal)   Resp 12   Ht 5\' 3"  (1.6 m)   Wt 45.6 kg   SpO2 100%   BMI 17.81 kg/m  Physical Exam Vitals and nursing note reviewed.  Constitutional:      Appearance: He is cachectic. He is ill-appearing.  HENT:     Head: Normocephalic and atraumatic.     Right Ear: External ear normal.     Left Ear: External ear normal.     Nose: Nose normal.     Mouth/Throat:     Mouth: Mucous membranes are moist.     Pharynx: Oropharynx is clear.  Eyes:     Extraocular Movements: Extraocular movements intact.     Conjunctiva/sclera: Conjunctivae normal.     Pupils: Pupils are equal, round, and reactive to light.  Cardiovascular:     Rate and Rhythm: Normal rate and regular rhythm.     Pulses: Normal pulses.     Heart sounds: Normal heart sounds.  Pulmonary:     Effort: Pulmonary effort is normal.     Breath sounds: Normal breath sounds.  Abdominal:     General: Abdomen is flat. Bowel sounds are normal.     Palpations: Abdomen is soft.  Musculoskeletal:        General: Normal range of motion.     Cervical back: Normal range of motion and neck supple.  Skin:    General: Skin is warm.      Capillary Refill: Capillary refill takes less than 2 seconds.  Neurological:     Mental Status: He is alert. He is disoriented.  Psychiatric:        Speech: Speech is delayed.     ED Results / Procedures / Treatments  Labs (all labs ordered are listed, but only abnormal results are displayed) Labs Reviewed  CBC WITH DIFFERENTIAL/PLATELET - Abnormal; Notable for the following components:      Result Value   Hemoglobin 10.1 (*)    HCT 32.4 (*)    MCV 62.4 (*)    MCH 19.5 (*)    RDW 16.0 (*)    All other components within normal limits  COMPREHENSIVE METABOLIC PANEL - Abnormal; Notable for the following components:   Sodium 131 (*)    Chloride 97 (*)    Glucose, Bld 182 (*)    Creatinine, Ser 0.60 (*)    Calcium 8.6 (*)    Total Protein 5.8 (*)    Albumin 3.2 (*)    All other components within normal limits  LACTIC ACID, PLASMA - Abnormal; Notable for the following components:   Lactic Acid, Venous 2.5 (*)    All other components within normal limits  CULTURE, BLOOD (ROUTINE X 2)  CULTURE, BLOOD (ROUTINE X 2)  URINE CULTURE  GASTROINTESTINAL PANEL BY PCR, STOOL (REPLACES STOOL CULTURE)  C DIFFICILE QUICK SCREEN W PCR REFLEX    URINALYSIS, ROUTINE W REFLEX MICROSCOPIC  CBG MONITORING, ED    EKG EKG Interpretation Date/Time:  Saturday October 02 2023 12:42:02 EST Ventricular Rate:  70 PR Interval:    QRS Duration:  150 QT Interval:  536 QTC Calculation: 579 R Axis:   -48  Text Interpretation: Atrial-sensed ventricular-paced rhythm No significant change since last tracing Confirmed by Jacalyn Lefevre [phone]) on 10/02/2023 1:43:40 PM  Radiology DG Chest Portable 1 View  Result Date: 10/02/2023 CLINICAL DATA:  Altered mental status. EXAM: PORTABLE CHEST 1 VIEW COMPARISON:  09/01/2023 FINDINGS: There is a left chest wall single lead pacer device with lead terminating in the right ventricle. Status post median sternotomy CABG procedure. Stable cardiomediastinal  contours. Blunting of the left costophrenic angle identified, unchanged from previous exam. Scar like densities noted in the left base. No signs of interstitial edema, airspace consolidation or pneumothorax. Bilateral glenohumeral joint osteoarthritis with marked narrowing of the acromial humeral interval bilaterally. IMPRESSION: 1. No acute cardiopulmonary abnormalities. 2. Chronic blunting of the left costophrenic angle. 3. Bilateral glenohumeral joint osteoarthritis with marked narrowing of the acromial humeral interval bilaterally. Electronically Signed   By: Signa Kell M.D.   On: 10/02/2023 13:34    Procedures Procedures    Medications Ordered in ED Medications  sodium chloride 0.9 % bolus 1,000 mL (1,000 mLs Intravenous New Bag/Given 10/02/23 1349)    ED Course/ Medical Decision Making/ A&P                                 Medical Decision Making Amount and/or Complexity of Data Reviewed Labs: ordered. Radiology: ordered.   This patient presents to the ED for concern of diarrhea, this involves an extensive number of treatment options, and is a complaint that carries with it a high risk of complications and morbidity.  The differential diagnosis includes infectious, gi bleed, electrolyte abn   Co morbidities that complicate the patient evaluation  cad, htn, gerd, copd, dm2, chf, hoh, and afib (on Eliquis)   Additional history obtained:  Additional history obtained from epic chart review External records from outside source obtained and reviewed including EMS report, family   Lab Tests:  I Ordered, and personally interpreted labs.  The pertinent results include:  cbc nl other than hgb 10.1 (hgb 11.0 on  11/6); cmp with na sl low at 131, glucose elevated at 182   Imaging Studies ordered:  I ordered imaging studies including cxr and ct abd/pelvis I independently visualized and interpreted imaging which showed  CXR: No acute cardiopulmonary abnormalities.  2. Chronic  blunting of the left costophrenic angle.  3. Bilateral glenohumeral joint osteoarthritis with marked narrowing  of the acromial humeral interval bilaterally.  CT abd/pelvis pending at shift change. I agree with the radiologist interpretation   Cardiac Monitoring:  The patient was maintained on a cardiac monitor.  I personally viewed and interpreted the cardiac monitored which showed an underlying rhythm of: nsr   Medicines ordered and prescription drug management:  I ordered medication including ivfs  for sx  Reevaluation of the patient after these medicines showed that the patient improved I have reviewed the patients home medicines and have made adjustments as needed   Test Considered:  ct   Critical Interventions:  ivfs  Problem List / ED Course:  Diarrhea:  recent abx use.  Possible c.diff.  ct abd/pelvis pending at shift change.   Reevaluation:  After the interventions noted above, I reevaluated the patient and found that they have :improved   Social Determinants of Health:  Lives at home   Dispostion:  Pending at shift change        Final Clinical Impression(s) / ED Diagnoses Final diagnoses:  Dehydration  Diarrhea, unspecified type    Rx / DC Orders ED Discharge Orders     None         Jacalyn Lefevre, MD 10/02/23 1540

## 2023-10-02 NOTE — Assessment & Plan Note (Signed)
Continue Pravachol 80 mg a day

## 2023-10-02 NOTE — Assessment & Plan Note (Signed)
 Obtain anemia panel  Transfuse for Hg <7 , rapidly dropping or  if symptomatic

## 2023-10-02 NOTE — Assessment & Plan Note (Signed)
Chronic stable continue home inhalers and prednisone

## 2023-10-02 NOTE — Assessment & Plan Note (Addendum)
Hold Colace C. difficile and gastric panel pending CT showing no evidence of colitis

## 2023-10-02 NOTE — Assessment & Plan Note (Signed)
Allow permissive hypertension for tonight 

## 2023-10-02 NOTE — ED Provider Notes (Signed)
Care assumed from Dr. Particia Nearing.  At time of transfer of care, patient is waiting for results of urinalysis, CT on pelvis, and stool samples given her high concern for C. difficile.  Given the patient's age, fatigue, and inability to ambulate due to this fatigue and dehydration, previous team suspected he will need admission after workup is completed.  Will wait for workup and reassess.  10:14 PM Patient's labs do show a lactic acidosis, will trend.  He also has an dehydration with low sodium and chloride likely from this diarrhea.  No leukocytosis and only mild anemia.  CT scan showed some stomach inflammation and bladder information although urinalysis is not consistent with UTI at this time.  Chest x-ray did not show pneumonia.  Family is concerned given his 2 days of inability to ambulate with fatigue, not eating and drinking, the diarrhea, and overall ill appearance.  Given his age of 89, these lab findings, and inability to ambulate, will call for admission for suspected fatigue related to dehydration and will hopefully get a stool sample sent off to make sure he does not have C. difficile as this was the primary concern from the previous team and EMS setting that his house smelled like C. difficile.  Will call for admission.   Munira Polson, Canary Brim, MD 10/02/23 [phone]

## 2023-10-02 NOTE — Assessment & Plan Note (Signed)
-   Order Sensitive  SSI     -  check TSH and HgA1C  - Hold by mouth medications*  

## 2023-10-02 NOTE — Subjective & Objective (Signed)
Presents with diarrhea and fatigue Recent history significant for admission for complete heart block on 3 November requiring pacemaker placed on the fifth. Patient postoperative course complicated by hematoma for which she was treated with Keflex for 1 week Now back on Eliquis He developed severe diarrhea and has became more fatigued.  No associated fevers Of note family is now interested in short-term rehab placement secondary to worsening debility

## 2023-10-02 NOTE — Assessment & Plan Note (Signed)
 Continue Eliquis 2.5 mg p.o. twice daily

## 2023-10-02 NOTE — Assessment & Plan Note (Signed)
Chronic stable when blood pressure allows would resume Imdur continue Pravachol 80 mg a day

## 2023-10-02 NOTE — ED Triage Notes (Signed)
Pt BIB EMS from home after family called out for lethargy, family said he has hx of UTI. EMS noted pt uses urinal attached to leg bag and urine was dark. Hx of pacemaker placement on left side, so left arm restriction. Bed bound at baseline.

## 2023-10-02 NOTE — Assessment & Plan Note (Signed)
Obtain electrolytes in the setting of dehydration will continue to follow sodium

## 2023-10-02 NOTE — Assessment & Plan Note (Signed)
Gently rehydrate and follow lactic acid

## 2023-10-02 NOTE — H&P (Signed)
Dakota [name]:[id number] DOB: [birth year] DOA: 10/02/2023     PCP: [name], MD   Outpatient Specialists:   CARDS:  Dr. Nicki Guadalajara, MD   Patient arrived to ER on 10/02/23 at 1216 Referred by Attending Tegeler, Canary Brim, *   Patient coming from:    home Lives With family    Chief Complaint:   Chief Complaint  Patient presents with   Weakness    HPI: Dakota [name] is a 89 y.o. male with medical history significant of CAD, HTN, COPD, DM2, CHF, a.fib on eliquis, complete heart block status post pacemaker    Presented with   diarrhea and fatigue Presents with diarrhea and fatigue Recent history significant for admission for complete heart block on 3 November requiring pacemaker placed on the fifth. Patient postoperative course complicated by hematoma for which she was treated with Keflex for 1 week Now back on Eliquis He developed severe diarrhea and has became more fatigued.  No associated fevers Of note family is now interested in short-term rehab placement secondary to worsening debility Daughter states that she has been giving him Colace twice a day and usually when she does that he has diarrhea  Denies significant ETOH intake   Does not smoke   Lab Results  Component Value Date   SARSCOV2NAA NEGATIVE 05/10/2023   SARSCOV2NAA NEGATIVE 11/16/2019        Regarding pertinent Chronic problems:    Hyperlipidemia - on statins  Pravachol Lipid Panel     Component Value Date/Time   CHOL 117 02/13/2022 1236   TRIG 55 02/13/2022 1236   HDL 53 02/13/2022 1236   CHOLHDL 2.2 02/13/2022 1236   CHOLHDL 2.2 07/20/2017 0949   VLDL 10 09/07/2016 1027   LDLCALC 52 02/13/2022 1236   LDLCALC 45 07/20/2017 0949   LABVLDL 12 02/13/2022 1236     HTN on Cozaar   chronic CHF  systolic -  On Lasix last echo  Recent Results (from the past 86578 hour(s))  ECHOCARDIOGRAM COMPLETE   Collection Time: 05/11/23  2:58 PM  Result Value   Weight 1,731.93   BP 114/70    S' Lateral 3.00   AR max vel 1.66   AV Area VTI 1.98   AV Mean grad 5.0   AV Peak grad 9.2   Ao pk vel 1.52   AV Area mean vel 1.67   Est EF 40 - 45%   Narrative      ECHOCARDIOGRAM REPORT      IMPRESSIONS    1. Technically difficult study.  2. Left ventricular ejection fraction, by estimation, is 40 to 45%. The left ventricle has mildly decreased function. The left ventricle demonstrates global hypokinesis. There is mild concentric left ventricular hypertrophy. Left ventricular diastolic  function could not be evaluated. Significant dyssynchrony due to LBBB.  3. Right ventricular systolic function is normal. The right ventricular size is normal.  4. There is severe calcifcation of the aortic valve. There is severe thickening of the aortic valve. Aortic valve regurgitation is not visualized. The aortic valve appears severly stenotic by visualization. There was limited/incomplete doppler  evaluation for aortic stenosis with a mean gradient of .  5. The mitral valve is degenerative. Mild mitral valve regurgitation. Moderate to severe mitral annular calcification.           CAD  - On Aspirin, statin, betablocker, Plavix                 - *  followed by cardiology                - last cardiac cath        DM 2 -  Lab Results  Component Value Date   HGBA1C 6.0 (H) 05/10/2023   PO meds only,        COPD - not  followed by pulmonology   not  on baseline oxygen      A. Fib -   atrial fibrillation CHA2DS2 vas score   6      current  on anticoagulation with   Eliquis,          -  Rate control:  Currently controlled with  Toprolol,            BPH - on  Proscar       Chronic anemia - baseline hg Hemoglobin & Hematocrit  Recent Labs    08/31/23 0258 09/01/23 0307 10/02/23 1340  HGB 11.2* 11.0* 10.1*   Iron/TIBC/Ferritin/ %Sat    Component Value Date/Time   IRON 37 (L) 05/16/2023 0718   TIBC 286 05/16/2023 0718   FERRITIN 86 05/16/2023 0718   FERRITIN 22 (L)  08/07/2020 1158   IRONPCTSAT 13 (L) 05/16/2023 0718    While in ER:     CT showing no evidence of colitis Elevated lactic acid and hyponatremia noted Possible gastritis  Lab Orders         Culture, blood (routine x 2)         Urine Culture         Gastrointestinal Panel by PCR , Stool         C Difficile Quick Screen w PCR reflex         CBC with Differential         Comprehensive metabolic panel         Urinalysis, Routine w reflex microscopic -Urine, Clean Catch         Lactic acid, plasma         CBG monitoring, ED       CXR -  NON acute  CTabd/pelvis - There is hazy low-attenuation fat surrounds the stomach mass suggests inflammation/edema, but there is no evidence of stomach wall thickening. This still may reflect gastritis, but is nonspecific. Consider cystitis     Following Medications were ordered in ER: Medications  sodium chloride 0.9 % bolus 1,000 mL (1,000 mLs Intravenous New Bag/Given 10/02/23 1349)  iohexol (OMNIPAQUE) 350 MG/ML injection 75 mL (75 mLs Intravenous Contrast Given 10/02/23 1636)       ED Triage Vitals  Encounter Vitals Group     BP 10/02/23 1227 (!) 110/58     Systolic BP Percentile --      Diastolic BP Percentile --      Pulse Rate 10/02/23 1227 70     Resp 10/02/23 1227 12     Temp 10/02/23 1241 97.6 F (36.4 C)     Temp Source 10/02/23 1241 Rectal     SpO2 10/02/23 1227 100 %     Weight 10/02/23 1231 100 lb 8.5 oz (45.6 kg)     Height 10/02/23 1231 5\' 3"  (1.6 m)     Head Circumference --      Peak Flow --      Pain Score 10/02/23 1231 0     Pain Loc --      Pain Education --      Exclude from Growth Chart --   NWGN(56)@  _________________________________________ Significant initial  Findings: Abnormal Labs Reviewed  CBC WITH DIFFERENTIAL/PLATELET - Abnormal; Notable for the following components:      Result Value   Hemoglobin 10.1 (*)    HCT 32.4 (*)    MCV 62.4 (*)    MCH 19.5 (*)    RDW 16.0 (*)    All other  components within normal limits  COMPREHENSIVE METABOLIC PANEL - Abnormal; Notable for the following components:   Sodium 131 (*)    Chloride 97 (*)    Glucose, Bld 182 (*)    Creatinine, Ser 0.60 (*)    Calcium 8.6 (*)    Total Protein 5.8 (*)    Albumin 3.2 (*)    All other components within normal limits  URINALYSIS, ROUTINE W REFLEX MICROSCOPIC - Abnormal; Notable for the following components:   Color, Urine STRAW (*)    All other components within normal limits  LACTIC ACID, PLASMA - Abnormal; Notable for the following components:   Lactic Acid, Venous 2.5 (*)    All other components within normal limits      ECG: Ordered Personally reviewed and interpreted by me showing: HR : 70 Rhythm:  Paced  BNP (last 3 results) Recent Labs    05/15/23 0234 05/17/23 0101 08/29/23 1218  BNP 1,110.2* 943.2* 1,217.7*     COVID-19 Labs  No results for input(s): "DDIMER", "FERRITIN", "LDH", "CRP" in the last 72 hours.  Lab Results  Component Value Date   SARSCOV2NAA NEGATIVE 05/10/2023   SARSCOV2NAA NEGATIVE 11/16/2019   _____   The recent clinical data is shown below. Vitals:   10/02/23 1241 10/02/23 1700 10/02/23 1900 10/02/23 2005  BP:  (!) 118/59 (!) 113/56   Pulse:  69 71   Resp:  17 16   Temp: 97.6 F (36.4 C)   97.9 F (36.6 C)  TempSrc: Rectal   Oral  SpO2:  100% 100%   Weight:      Height:        WBC     Component Value Date/Time   WBC 5.7 10/02/2023 1340   LYMPHSABS 1.4 10/02/2023 1340   MONOABS 0.1 10/02/2023 1340   EOSABS 0.1 10/02/2023 1340   BASOSABS 0.0 10/02/2023 1340    Lactic Acid, Venous    Component Value Date/Time   LATICACIDVEN 2.5 (HH) 10/02/2023 1340   Procalcitonin   Ordered      UA   no evidence of UTI      Urine analysis:    Component Value Date/Time   COLORURINE STRAW (A) 10/02/2023 1736   APPEARANCEUR CLEAR 10/02/2023 1736   LABSPEC 1.010 10/02/2023 1736   PHURINE 7.0 10/02/2023 1736   GLUCOSEU NEGATIVE 10/02/2023 1736    HGBUR NEGATIVE 10/02/2023 1736   BILIRUBINUR NEGATIVE 10/02/2023 1736   KETONESUR NEGATIVE 10/02/2023 1736   PROTEINUR NEGATIVE 10/02/2023 1736   NITRITE NEGATIVE 10/02/2023 1736   LEUKOCYTESUR NEGATIVE 10/02/2023 1736    Results for orders placed or performed during the hospital encounter of 08/29/23  MRSA Next Gen by PCR, Nasal     Status: None   Collection Time: 08/29/23  4:16 PM   Specimen: Nasal Mucosa; Nasal Swab  Result Value Ref Range Status   MRSA by PCR Next Gen NOT DETECTED NOT DETECTED Final    Comment: (NOTE) The GeneXpert MRSA Assay (FDA approved for NASAL specimens only), is one component of a comprehensive MRSA colonization surveillance program. It is not intended to diagnose MRSA infection nor to guide or monitor treatment  for MRSA infections. Test performance is not FDA approved in patients less than 58 years old. Performed at [facility], [address], [place], [place] [address]   Surgical PCR screen     Status: None   Collection Time: 08/31/23  9:19 AM   Specimen: Nasal Mucosa; Nasal Swab  Result Value Ref Range Status   MRSA, PCR NEGATIVE NEGATIVE Final   Staphylococcus aureus NEGATIVE NEGATIVE Final    Comment: (NOTE) The Xpert SA Assay (FDA approved for NASAL specimens in patients 54 years of age and older), is one component of a comprehensive surveillance program. It is not intended to diagnose infection nor to guide or monitor treatment. Performed at [facility], [address], [place], [place] [address]     ABX started Antibiotics Given (last 72 hours)     None       No results found for the last 90 days.    __________________________________________________________ Recent Labs  Lab 10/02/23 1340  NA 131*  K 3.8  CO2 23  GLUCOSE 182*  BUN 16  CREATININE 0.60*  CALCIUM 8.6*    Cr  stable,    Lab Results  Component Value Date   CREATININE 0.60 (L) 10/02/2023   CREATININE 0.73 09/01/2023   CREATININE 0.62  08/31/2023    Recent Labs  Lab 10/02/23 1340  AST 25  ALT 16  ALKPHOS 49  BILITOT 0.9  PROT 5.8*  ALBUMIN 3.2*   Lab Results  Component Value Date   CALCIUM 8.6 (L) 10/02/2023   PHOS 2.8 09/01/2023    Plt: Lab Results  Component Value Date   PLT 175 10/02/2023      Recent Labs  Lab 10/02/23 1340  WBC 5.7  NEUTROABS 4.2  HGB 10.1*  HCT 32.4*  MCV 62.4*  PLT 175    HG/HCT   Down   from baseline see below    Component Value Date/Time   HGB 10.1 (L) 10/02/2023 1340   HGB 10.5 (L) 06/06/2021 1129   HCT 32.4 (L) 10/02/2023 1340   HCT 36.4 (L) 06/06/2021 1129   MCV 62.4 (L) 10/02/2023 1340   MCV 66 (L) 06/06/2021 1129     _______________________________________________ Hospitalist was called for admission for   Dehydration  Diarrhea  Lactic acidosis    Hyponatremia    The following Work up has been ordered so far:  Orders Placed This Encounter  Procedures   Culture, blood (routine x 2)   Urine Culture   Gastrointestinal Panel by PCR , Stool   C Difficile Quick Screen w PCR reflex   DG Chest Portable 1 View   CT ABDOMEN PELVIS W CONTRAST   CBC with Differential   Comprehensive metabolic panel   Urinalysis, Routine w reflex microscopic -Urine, Clean Catch   Lactic acid, plasma   ED Cardiac monitoring   Consult to hospitalist   Enteric precautions (UV disinfection) C difficile, Norovirus   CBG monitoring, ED   ED EKG   Insert peripheral IV     OTHER Significant initial  Findings:  labs showing:     DM  labs:  HbA1C: Recent Labs    05/10/23 1634  HGBA1C 6.0*       CBG (last 3)  No results for input(s): "GLUCAP" in the last 72 hours.        Cultures: No results found for: "SDES", "SPECREQUEST", "CULT", "REPTSTATUS"   Radiological Exams on Admission: CT ABDOMEN PELVIS W CONTRAST  Result Date: 10/02/2023 CLINICAL  DATA:  Lethargy.  History of UTI. EXAM: CT ABDOMEN AND PELVIS WITH CONTRAST TECHNIQUE: Multidetector CT imaging of the  abdomen and pelvis was performed using the standard protocol following bolus administration of intravenous contrast. RADIATION DOSE REDUCTION: This exam was performed according to the departmental dose-optimization program which includes automated exposure control, adjustment of the mA and/or kV according to patient size and/or use of iterative reconstruction technique. CONTRAST:  75mL OMNIPAQUE IOHEXOL 350 MG/ML SOLN COMPARISON:  05/10/2023. FINDINGS: Lower chest: No acute abnormality. Hepatobiliary: No focal liver abnormality is seen. No gallstones, gallbladder wall thickening, or biliary dilatation. Pancreas: Unremarkable. No pancreatic ductal dilatation or surrounding inflammatory changes. Spleen: Normal in size without focal abnormality. Adrenals/Urinary Tract: No adrenal mass. Kidneys normal in overall size, orientation and position with symmetric enhancement and excretion. Bilateral stable renal cysts measuring up to 1.8 cm. No follow-up indicated. No stones. Mild prominence of both intrarenal collecting systems and proximal ureters. No ureteral stone. Ureters not well-defined, but grossly normal in course and in caliber. Mild prominence of the bladder wall without overt thickening. No bladder mass or stone. Stomach/Bowel: Stomach is decompressed. There is low attenuation infiltrating the fat surrounding the stomach extending to the adjacent transverse colon. No convincing stomach wall thickening. Small bowel and colon are normal in caliber. No wall thickening or convincing inflammation. Vascular/Lymphatic: Aortic atherosclerosis. No aneurysm. No enlarged lymph nodes. Reproductive: Heterogeneous prostate, mildly enlarged. Other: No abdominal wall hernia or abnormality. No abdominopelvic ascites. Musculoskeletal: No fracture or acute finding. Skeletal structures are demineralized. There is a mottled type appearance of the skeleton with small ill-defined lucencies that are felt to be due to the diffuse bony  demineralization. No defined bone lesion and no change. IMPRESSION: 1. There is hazy low-attenuation fat surrounds the stomach mass suggests inflammation/edema, but there is no evidence of stomach wall thickening. This still may reflect gastritis, but is nonspecific. 2. Mild prominence of the intrarenal collecting systems and proximal ureters. Bladder wall appears prominent but not overly thickened. Consider cystitis in the proper clinical setting. No CT evidence of pyelonephritis. 3. No other evidence of an acute abnormality within the abdomen or pelvis. 4. Aortic atherosclerosis. Aortic Atherosclerosis (ICD10-I70.0). Electronically Signed   By: Amie Portland M.D.   On: 10/02/2023 16:59   DG Chest Portable 1 View  Result Date: 10/02/2023 CLINICAL DATA:  Altered mental status. EXAM: PORTABLE CHEST 1 VIEW COMPARISON:  09/01/2023 FINDINGS: There is a left chest wall single lead pacer device with lead terminating in the right ventricle. Status post median sternotomy CABG procedure. Stable cardiomediastinal contours. Blunting of the left costophrenic angle identified, unchanged from previous exam. Scar like densities noted in the left base. No signs of interstitial edema, airspace consolidation or pneumothorax. Bilateral glenohumeral joint osteoarthritis with marked narrowing of the acromial humeral interval bilaterally. IMPRESSION: 1. No acute cardiopulmonary abnormalities. 2. Chronic blunting of the left costophrenic angle. 3. Bilateral glenohumeral joint osteoarthritis with marked narrowing of the acromial humeral interval bilaterally. Electronically Signed   By: Signa Kell M.D.   On: 10/02/2023 13:34   _______________________________________________________________________________________________________ Latest  Blood pressure (!) 113/56, pulse 71, temperature 97.9 F (36.6 C), temperature source Oral, resp. rate 16, height 5\' 3"  (1.6 m), weight 45.6 kg, SpO2 100%.   Vitals  labs and radiology finding  personally reviewed  Review of Systems:    Pertinent positives include:  diarrhea,   Constitutional:  No weight loss, night sweats, Fevers, chills, fatigue, weight loss  HEENT:  No headaches, Difficulty swallowing,Tooth/dental problems,Sore  throat,  No sneezing, itching, ear ache, nasal congestion, post nasal drip,  Cardio-vascular:  No chest pain, Orthopnea, PND, anasarca, dizziness, palpitations.no Bilateral lower extremity swelling  GI:  No heartburn, indigestion, abdominal pain, nausea, vomiting, change in bowel habits, loss of appetite, melena, blood in stool, hematemesis Resp:  no shortness of breath at rest. No dyspnea on exertion, No excess mucus, no productive cough, No non-productive cough, No coughing up of blood.No change in color of mucus.No wheezing. Skin:  no rash or lesions. No jaundice GU:  no dysuria, change in color of urine, no urgency or frequency. No straining to urinate.  No flank pain.  Musculoskeletal:  No joint pain or no joint swelling. No decreased range of motion. No back pain.  Psych:  No change in mood or affect. No depression or anxiety. No memory loss.  Neuro: no localizing neurological complaints, no tingling, no weakness, no double vision, no gait abnormality, no slurred speech, no confusion  All systems reviewed and apart from HOPI all are negative _______________________________________________________________________________________________ Past Medical History:   Past Medical History:  Diagnosis Date   Aortic atherosclerosis (HCC)    Arthritis    Atrial fibrillation, chronic (HCC)    Benign localized prostatic hyperplasia with lower urinary tract symptoms (LUTS)    CAD (coronary artery disease)    Chronic chest pain    takes imdur   Chronic combined systolic (congestive) and diastolic (congestive) heart failure (HCC)    followed by cardiology   Chronic dyspnea    Cognitive impairment    COPD, moderate (HCC)    Edema of both lower  extremities    takes lasix and uses teds   First degree heart block    GERD (gastroesophageal reflux disease)    Heart murmur    History of cardiovascular stress test    Lexiscan Myoview 7/16:  EF 51%, inferior, inferolateral ischemia; Intermediate Risk   History of smallpox    HOH (hard of hearing)    even with hearing aids   Hypertension    Ischemic heart disease 2005   Dr Tresa Endo a. s/p CABG;  b.  LHC 05/23/15:  S-RPAVB ok, S-D2 ok, S-OM2 99% (s/p Synergy DES), L-LAD ok, EF normal (complicated by pseudoaneurysm)    LBBB (left bundle branch block)    Right arm weakness    S/P CABG x 4    01-08-2004   S/P drug eluting coronary stent placement    05-21-2015  PCI and DES x1 to SVG--OM graft   Thalassemia minor    Type 2 diabetes, diet controlled (HCC)    Wears glasses    Wears hearing aid in both ears       Past Surgical History:  Procedure Laterality Date   APPENDECTOMY     CARDIAC CATHETERIZATION  1989   in [place]   normal     CARDIAC CATHETERIZATION N/A 05/23/2015   Procedure: Left Heart Cath and Cors/Grafts Angiography;  Surgeon: Lennette Bihari, MD;  Location: [facility];  Service: Cardiovascular;  Laterality: N/A;   CARDIAC CATHETERIZATION N/A 05/23/2015   Procedure: Coronary Stent Intervention;  Surgeon: Lennette Bihari, MD;  Location: [facility];  Service: Cardiovascular;  Laterality: N/A;   CARDIAC CATHETERIZATION  01-04-2004   dr Melburn Popper  @[facility]    severe 3V CAD, normal LVF   CATARACT EXTRACTION W/ INTRAOCULAR LENS  IMPLANT, BILATERAL     CORONARY ARTERY BYPASS GRAFT  01-08-2004  dr hendrickson @[facility]    LIMA to LAD,  SVG to D1,  SVG to OM,  SVG to PDA   FOOT SURGERY Right    INGUINAL HERNIA REPAIR Bilateral 1978   INGUINAL HERNIA REPAIR Bilateral 05/12/2023   Procedure: HERNIA REPAIR INGUINAL ADULT;  Surgeon: Griselda Miner, MD;  Location: [facility] OR;  Service: General;  Laterality: Bilateral;   INSERTION OF MESH Bilateral 05/12/2023   Procedure: INSERTION OF  MESH;  Surgeon: Griselda Miner, MD;  [facility] OR;  Service: General;  Laterality: Bilateral;   MASS EXCISION Left 11/20/2019   Procedure: EXCISION OF SEBACEOUS CYST CHEST;  Surgeon: Sheliah Hatch De Blanch, MD;  Location: [facility];  Service: General;  Laterality: Left;   PACEMAKER IMPLANT N/A 08/31/2023   Procedure: PACEMAKER IMPLANT;  Surgeon: Marinus Maw, MD;  Location: [facility];  Service: Cardiovascular;  Laterality: N/A;    Social History:  Ambulatory   walker      reports that he has never smoked. He has never used smokeless tobacco. He reports that he does not drink alcohol and does not use drugs.     Family History:   Family History  Problem Relation Age of Onset   Healthy Father    Ulcers Mother    Stroke Mother    ______________________________________________________________________________________________ Allergies: Allergies  Allergen Reactions   Aceon [Perindopril Erbumine] Shortness Of Breath and Cough   Sulfa Antibiotics Swelling   Pork-Derived Products Other (See Comments)    muslim     Prior to Admission medications   Medication Sig Start Date End Date Taking? Authorizing Provider  acetaminophen (TYLENOL) 500 MG tablet Take 1 tablet (500 mg total) by mouth every 8 (eight) hours as needed for moderate pain. 05/17/23 05/16/24  Leroy Sea, MD  albuterol (PROVENTIL HFA;VENTOLIN HFA) 108 (90 Base) MCG/ACT inhaler Inhale 2 puffs into the lungs every 4 (four) hours as needed for wheezing or shortness of breath. 01/03/16   Charm Rings, MD  apixaban (ELIQUIS) 2.5 MG TABS tablet Take 1 tablet (2.5 mg total) by mouth 2 (two) times daily. 09/06/23   Marinda Elk, MD  apixaban (ELIQUIS) 2.5 MG TABS tablet Take 1 tablet (2.5 mg total) by mouth 2 (two) times daily. 09/17/23   Lennette Bihari, MD  Blood Glucose Monitoring Suppl (ONE TOUCH ULTRA 2) w/Device KIT daily in the afternoon. 11/02/22   [provider]  cephALEXin  (KEFLEX) 500 MG capsule Take 1 capsule (500 mg total) by mouth 2 (two) times daily. 09/22/23   Marinus Maw, MD  docusate sodium (COLACE) 100 MG capsule Take 1 capsule (100 mg total) by mouth 2 (two) times daily 05/17/23   Leroy Sea, MD  finasteride (PROSCAR) 5 MG tablet Take 5 mg by mouth at bedtime.     [provider]  furosemide (LASIX) 20 MG tablet 20 MG EVERY 4 DAYS Patient taking differently: Take 20 mg by mouth See admin instructions. 20 MG EVERY 4 DAYS 03/06/23   Ronney Asters, NP  isosorbide mononitrate (IMDUR) 30 MG 24 hr tablet Take 1 tablet by mouth once daily 06/07/23   Ronney Asters, NP  ketorolac (ACULAR) 0.5 % ophthalmic solution Place 1 drop into the left eye 4 (four) times daily.    [provider]  losartan (COZAAR) 25 MG tablet Take 1 tablet (25 mg total) by mouth daily. 09/16/23   Marinda Elk, MD  metFORMIN (GLUCOPHAGE) 500 MG tablet Take 500 mg by mouth 2 (two) times daily.  [provider]  mirabegron ER (MYRBETRIQ) 50 MG TB24 tablet Take 50 mg by mouth at bedtime.     [provider]  Multiple Vitamin (MULTIVITAMIN WITH MINERALS) TABS tablet Take 1 tablet by mouth daily.    [provider]  Multiple Vitamins-Minerals (PRESERVISION AREDS 2 PO) Take 1 tablet by mouth 2 (two) times daily.     [provider]  NITROSTAT 0.4 MG SL tablet DISSOLVE ONE TABLET UNDER THE TONGUE EVERY 5 MINUTES AS NEEDED FOR CHEST PAIN. Patient taking differently: Place 0.4 mg under the tongue every 5 (five) minutes as needed for chest pain. 01/13/22   Lennette Bihari, MD  ONE TOUCH ULTRA TEST test strip 1 each by Other route as needed (glucose monoring).  04/21/15   [provider]  pantoprazole (PROTONIX) 40 MG tablet Take 1 tablet (40 mg total) by mouth daily. Patient taking differently: Take 40 mg by mouth daily. 05/21/15   Tereso Newcomer T, PA-C  pravastatin (PRAVACHOL) 80 MG tablet Take 80 mg by mouth at bedtime.     [provider]  prednisoLONE acetate (PRED FORTE) 1 % ophthalmic suspension Place 1 drop into the left eye 4 (four) times daily.    [provider]  predniSONE (DELTASONE) 10 MG tablet Take 1 tablet by mouth daily.    [provider]    ___________________________________________________________________________________________________ Physical Exam:    10/02/2023    7:00 PM 10/02/2023    5:00 PM 10/02/2023   12:31 PM  Vitals with BMI  Height   5\' 3"   Weight   100 lbs 8 oz  BMI   17.81  Systolic 113 118   Diastolic 56 59   Pulse 71 69     1. General:  in No  Acute distress   Chronically ill  -appearing 2. Psychological: Alert and   Oriented to self 3. Head/ENT:    Dry Mucous Membranes                          Head Non traumatic, neck supple                          Poor Dentition 4. SKIN:  decreased Skin turgor,  Skin clean Dry and intact no rash    5. Heart: Regular rate and rhythm no  Murmur, no Rub or gallop 6. Lungs , no wheezes or crackles   7. Abdomen: Soft,  non-tender, thin Non distended bowel sounds present 8. Lower extremities: no clubbing, cyanosis, no  edema 9. Neurologically Grossly intact, moving all 4 extremities equally  10. MSK: Normal range of motion    Chart has been reviewed  ______________________________________________________________________________________________  Assessment/Plan 89 y.o. male with medical history significant of CAD, HTN, COPD, DM2, CHF, a.fib on eliquis, complete heart block status post pacemaker   Admitted for   Dehydration  Diarrhea  Lactic acidosis    Hyponatremia   Present on Admission:  Lactic acidosis  COPD, moderate (HCC)  Atrial fibrillation with slow ventricular response (HCC)  CAD (coronary artery disease) of bypass graft  Chronic systolic CHF (congestive heart failure) (HCC)  Essential hypertension  Hypercholesterolemia  Diarrhea  Dehydration  Hyponatremia  Debility   Normocytic anemia    COPD, moderate (HCC) Chronic stable continue home inhalers and prednisone  Atrial fibrillation with slow ventricular response (HCC) Continue Eliquis 2.5 mg p.o. twice daily  CAD (coronary artery disease) of bypass graft Chronic  stable when blood pressure allows would resume Imdur continue Pravachol 80 mg a day  Chronic systolic CHF (congestive heart failure) (HCC) Currently appears to be on a dry side we will gently rehydrate  Diabetes (HCC)  - Order Sensitive SSI    -  check TSH and HgA1C  - Hold by mouth medications    Essential hypertension Allow permissive hypertension for tonight  Hypercholesterolemia Continue Pravachol 80 mg a day  Lactic acidosis Gently rehydrate and follow lactic acid  Diarrhea Hold Colace C. difficile and gastric panel pending CT showing no evidence of colitis  Dehydration Gently rehydrate  Hyponatremia Obtain electrolytes in the setting of dehydration will continue to follow sodium  Debility Would benefit from PT OT evaluation prior to discharge  Normocytic anemia Obtain anemia panel  Transfuse for Hg <7 , rapidly dropping or  if symptomatic   Other plan as per orders.  DVT prophylaxis:  eliquis    Code Status:    Code Status: Prior FULL CODE   as per   family  I had personally discussed CODE STATUS with patient   ACP   none    Family Communication:   Family   at  Bedside  plan of care was discussed   with  Daughter,   Diet diabetic   Disposition Plan:     likely will need placement for rehabilitation                             Following barriers for discharge:                                                   Electrolytes corrected                               Anemia   stable                                    Consult Orders  (From admission, onward)           Start     Ordered   10/02/23 2216  Consult to hospitalist  Paged by Annice Pih  Once       Provider:  (Not yet assigned)   Question Answer Comment  Place call to: [facility]   Reason for Consult Admit      10/02/23 2215                               Would benefit from PT/OT eval prior to DC  Ordered                   Swallow eval - SLP ordered                                       Transition of care consulted                   Nutrition    consulted  Consults called: none   Admission status:  ED Disposition     ED Disposition  Admit   Condition  --   Comment  Hospital Area: [facility] [[id number]]  Level of Care: Progressive [102]  Admit to Progressive based on following criteria: MULTISYSTEM THREATS such as stable sepsis, metabolic/electrolyte imbalance with or without encephalopathy that is responding to early treatment.  May place patient in observation at [facility] or [facility] if equivalent level of care is available:: No  Covid Evaluation: Asymptomatic - no recent exposure (last 10 days) testing not required  Diagnosis: Lactic acidosis [161096]  Admitting Physician: Therisa Doyne [[id number]]  Attending Physician: Therisa Doyne [[id number]]           Obs    Level of care       progressive       tele indefinitely please discontinue once patient no longer qualifies COVID-19 Labs    Lab Results  Component Value Date   SARSCOV2NAA NEGATIVE 05/10/2023    Thomasine Klutts 10/02/2023, 11:29 PM    [facility]     after 2 AM please page floor coverage PA If 7AM-7PM, please contact the day team taking care of the patient using [email or web address]

## 2023-10-03 ENCOUNTER — Encounter (HOSPITAL_COMMUNITY): Payer: Self-pay | Admitting: Internal Medicine

## 2023-10-03 ENCOUNTER — Other Ambulatory Visit: Payer: Self-pay

## 2023-10-03 DIAGNOSIS — Z515 Encounter for palliative care: Secondary | ICD-10-CM | POA: Diagnosis not present

## 2023-10-03 DIAGNOSIS — R64 Cachexia: Secondary | ICD-10-CM | POA: Diagnosis present

## 2023-10-03 DIAGNOSIS — E1169 Type 2 diabetes mellitus with other specified complication: Secondary | ICD-10-CM | POA: Diagnosis present

## 2023-10-03 DIAGNOSIS — D638 Anemia in other chronic diseases classified elsewhere: Secondary | ICD-10-CM | POA: Diagnosis present

## 2023-10-03 DIAGNOSIS — Z681 Body mass index (BMI) 19 or less, adult: Secondary | ICD-10-CM | POA: Diagnosis not present

## 2023-10-03 DIAGNOSIS — E872 Acidosis, unspecified: Secondary | ICD-10-CM | POA: Diagnosis present

## 2023-10-03 DIAGNOSIS — J449 Chronic obstructive pulmonary disease, unspecified: Secondary | ICD-10-CM | POA: Diagnosis present

## 2023-10-03 DIAGNOSIS — I700 Atherosclerosis of aorta: Secondary | ICD-10-CM | POA: Diagnosis present

## 2023-10-03 DIAGNOSIS — A419 Sepsis, unspecified organism: Secondary | ICD-10-CM | POA: Diagnosis present

## 2023-10-03 DIAGNOSIS — I482 Chronic atrial fibrillation, unspecified: Secondary | ICD-10-CM | POA: Diagnosis present

## 2023-10-03 DIAGNOSIS — L89312 Pressure ulcer of right buttock, stage 2: Secondary | ICD-10-CM | POA: Diagnosis present

## 2023-10-03 DIAGNOSIS — E860 Dehydration: Secondary | ICD-10-CM | POA: Diagnosis present

## 2023-10-03 DIAGNOSIS — I4821 Permanent atrial fibrillation: Secondary | ICD-10-CM | POA: Diagnosis present

## 2023-10-03 DIAGNOSIS — Q250 Patent ductus arteriosus: Secondary | ICD-10-CM | POA: Diagnosis not present

## 2023-10-03 DIAGNOSIS — L89322 Pressure ulcer of left buttock, stage 2: Secondary | ICD-10-CM | POA: Diagnosis present

## 2023-10-03 DIAGNOSIS — I25810 Atherosclerosis of coronary artery bypass graft(s) without angina pectoris: Secondary | ICD-10-CM | POA: Diagnosis present

## 2023-10-03 DIAGNOSIS — I5042 Chronic combined systolic (congestive) and diastolic (congestive) heart failure: Secondary | ICD-10-CM | POA: Diagnosis present

## 2023-10-03 DIAGNOSIS — I080 Rheumatic disorders of both mitral and aortic valves: Secondary | ICD-10-CM | POA: Diagnosis present

## 2023-10-03 DIAGNOSIS — R197 Diarrhea, unspecified: Secondary | ICD-10-CM | POA: Diagnosis present

## 2023-10-03 DIAGNOSIS — L89323 Pressure ulcer of left buttock, stage 3: Secondary | ICD-10-CM | POA: Diagnosis present

## 2023-10-03 DIAGNOSIS — E871 Hypo-osmolality and hyponatremia: Secondary | ICD-10-CM | POA: Diagnosis present

## 2023-10-03 DIAGNOSIS — I358 Other nonrheumatic aortic valve disorders: Secondary | ICD-10-CM | POA: Diagnosis not present

## 2023-10-03 DIAGNOSIS — I110 Hypertensive heart disease with heart failure: Secondary | ICD-10-CM | POA: Diagnosis present

## 2023-10-03 DIAGNOSIS — E43 Unspecified severe protein-calorie malnutrition: Secondary | ICD-10-CM | POA: Diagnosis present

## 2023-10-03 LAB — COMPREHENSIVE METABOLIC PANEL
ALT: 16 U/L (ref 0–44)
AST: 21 U/L (ref 15–41)
Albumin: 3.2 g/dL — ABNORMAL LOW (ref 3.5–5.0)
Alkaline Phosphatase: 47 U/L (ref 38–126)
Anion gap: 9 (ref 5–15)
BUN: 12 mg/dL (ref 8–23)
CO2: 26 mmol/L (ref 22–32)
Calcium: 8.4 mg/dL — ABNORMAL LOW (ref 8.9–10.3)
Chloride: 101 mmol/L (ref 98–111)
Creatinine, Ser: 0.75 mg/dL (ref 0.61–1.24)
GFR, Estimated: >60 mL/min (ref >60)
Glucose, Bld: 106 mg/dL — ABNORMAL HIGH (ref 70–99)
Potassium: 3.7 mmol/L (ref 3.5–5.1)
Sodium: 136 mmol/L (ref 135–145)
Total Bilirubin: 1.2 mg/dL — ABNORMAL HIGH (ref <1.2)
Total Protein: 5.9 g/dL — ABNORMAL LOW (ref 6.5–8.1)

## 2023-10-03 LAB — BASIC METABOLIC PANEL
Anion gap: 9 (ref 5–15)
Anion gap: 10 (ref 5–15)
Anion gap: 11 (ref 5–15)
Anion gap: 7 (ref 5–15)
BUN: 12 mg/dL (ref 8–23)
BUN: 11 mg/dL (ref 8–23)
BUN: 18 mg/dL (ref 8–23)
BUN: 22 mg/dL (ref 8–23)
CO2: 24 mmol/L (ref 22–32)
CO2: 25 mmol/L (ref 22–32)
CO2: 22 mmol/L (ref 22–32)
CO2: 26 mmol/L (ref 22–32)
Calcium: 8.1 mg/dL — ABNORMAL LOW (ref 8.9–10.3)
Calcium: 8.3 mg/dL — ABNORMAL LOW (ref 8.9–10.3)
Calcium: 8.8 mg/dL — ABNORMAL LOW (ref 8.9–10.3)
Calcium: 8.6 mg/dL — ABNORMAL LOW (ref 8.9–10.3)
Chloride: 101 mmol/L (ref 98–111)
Chloride: 99 mmol/L (ref 98–111)
Chloride: 100 mmol/L (ref 98–111)
Chloride: 99 mmol/L (ref 98–111)
Creatinine, Ser: 0.59 mg/dL — ABNORMAL LOW (ref 0.61–1.24)
Creatinine, Ser: 0.78 mg/dL (ref 0.61–1.24)
Creatinine, Ser: 1.12 mg/dL (ref 0.61–1.24)
Creatinine, Ser: 1 mg/dL (ref 0.61–1.24)
GFR, Estimated: >60 mL/min (ref >60)
GFR, Estimated: >60 mL/min (ref >60)
GFR, Estimated: >60 mL/min (ref >60)
GFR, Estimated: >60 mL/min (ref >60)
Glucose, Bld: 87 mg/dL (ref 70–99)
Glucose, Bld: 163 mg/dL — ABNORMAL HIGH (ref 70–99)
Glucose, Bld: 325 mg/dL — ABNORMAL HIGH (ref 70–99)
Glucose, Bld: 162 mg/dL — ABNORMAL HIGH (ref 70–99)
Potassium: 3.8 mmol/L (ref 3.5–5.1)
Potassium: 3.5 mmol/L (ref 3.5–5.1)
Potassium: 4.5 mmol/L (ref 3.5–5.1)
Potassium: 4.3 mmol/L (ref 3.5–5.1)
Sodium: 134 mmol/L — ABNORMAL LOW (ref 135–145)
Sodium: 134 mmol/L — ABNORMAL LOW (ref 135–145)
Sodium: 133 mmol/L — ABNORMAL LOW (ref 135–145)
Sodium: 132 mmol/L — ABNORMAL LOW (ref 135–145)

## 2023-10-03 LAB — IRON AND TIBC
Iron: 102 ug/dL (ref 45–182)
Saturation Ratios: 45 % — ABNORMAL HIGH (ref 17.9–39.5)
TIBC: 225 ug/dL — ABNORMAL LOW (ref 250–450)
UIBC: 123 ug/dL

## 2023-10-03 LAB — HEPATIC FUNCTION PANEL
ALT: 15 U/L (ref 0–44)
AST: 23 U/L (ref 15–41)
Albumin: 3 g/dL — ABNORMAL LOW (ref 3.5–5.0)
Alkaline Phosphatase: 45 U/L (ref 38–126)
Bilirubin, Direct: 0.3 mg/dL — ABNORMAL HIGH (ref 0.0–0.2)
Indirect Bilirubin: 0.6 mg/dL (ref 0.3–0.9)
Total Bilirubin: 0.9 mg/dL (ref <1.2)
Total Protein: 5.4 g/dL — ABNORMAL LOW (ref 6.5–8.1)

## 2023-10-03 LAB — RETICULOCYTES
Immature Retic Fract: 13 % (ref 2.3–15.9)
RBC.: 5.18 MIL/uL (ref 4.22–5.81)
Retic Count, Absolute: 53.9 10*3/uL (ref 19.0–186.0)
Retic Ct Pct: 1 % (ref 0.4–3.1)

## 2023-10-03 LAB — I-STAT VENOUS BLOOD GAS, ED
Acid-Base Excess: 4 mmol/L — ABNORMAL HIGH (ref 0.0–2.0)
Bicarbonate: 24.4 mmol/L (ref 20.0–28.0)
Calcium, Ion: 0.86 mmol/L — CL (ref 1.15–1.40)
HCT: 29 % — ABNORMAL LOW (ref 39.0–52.0)
Hemoglobin: 9.9 g/dL — ABNORMAL LOW (ref 13.0–17.0)
O2 Saturation: 100 %
Potassium: 3.7 mmol/L (ref 3.5–5.1)
Sodium: 136 mmol/L (ref 135–145)
TCO2: 25 mmol/L (ref 22–32)
pCO2, Ven: 23.5 mm[Hg] — ABNORMAL LOW (ref 44–60)
pH, Ven: 7.624 (ref 7.25–7.43)
pO2, Ven: 131 mm[Hg] — ABNORMAL HIGH (ref 32–45)

## 2023-10-03 LAB — GLUCOSE, CAPILLARY
Glucose-Capillary: 108 mg/dL — ABNORMAL HIGH (ref 70–99)
Glucose-Capillary: 137 mg/dL — ABNORMAL HIGH (ref 70–99)
Glucose-Capillary: 236 mg/dL — ABNORMAL HIGH (ref 70–99)
Glucose-Capillary: 247 mg/dL — ABNORMAL HIGH (ref 70–99)
Glucose-Capillary: 322 mg/dL — ABNORMAL HIGH (ref 70–99)
Glucose-Capillary: 83 mg/dL (ref 70–99)

## 2023-10-03 LAB — HEMOGLOBIN A1C
Hgb A1c MFr Bld: 6.8 % — ABNORMAL HIGH (ref 4.8–5.6)
Mean Plasma Glucose: 148.46 mg/dL

## 2023-10-03 LAB — CREATININE, URINE, RANDOM: Creatinine, Urine: <10 mg/dL

## 2023-10-03 LAB — CBC
HCT: 33.6 % — ABNORMAL LOW (ref 39.0–52.0)
Hemoglobin: 10.6 g/dL — ABNORMAL LOW (ref 13.0–17.0)
MCH: 19.3 pg — ABNORMAL LOW (ref 26.0–34.0)
MCHC: 31.5 g/dL (ref 30.0–36.0)
MCV: 61.3 fL — ABNORMAL LOW (ref 80.0–100.0)
Platelets: 164 10*3/uL (ref 150–400)
RBC: 5.48 MIL/uL (ref 4.22–5.81)
RDW: 15.9 % — ABNORMAL HIGH (ref 11.5–15.5)
WBC: 6.3 10*3/uL (ref 4.0–10.5)
nRBC: 0 % (ref 0.0–0.2)

## 2023-10-03 LAB — MAGNESIUM
Magnesium: 1.6 mg/dL — ABNORMAL LOW (ref 1.7–2.4)
Magnesium: 2.3 mg/dL (ref 1.7–2.4)

## 2023-10-03 LAB — PHOSPHORUS
Phosphorus: 3.5 mg/dL (ref 2.5–4.6)
Phosphorus: 3.7 mg/dL (ref 2.5–4.6)

## 2023-10-03 LAB — OSMOLALITY, URINE: Osmolality, Ur: 328 mosm/kg (ref 300–900)

## 2023-10-03 LAB — CBG MONITORING, ED: Glucose-Capillary: 88 mg/dL (ref 70–99)

## 2023-10-03 LAB — SODIUM, URINE, RANDOM: Sodium, Ur: 116 mmol/L

## 2023-10-03 LAB — TSH: TSH: 0.88 u[IU]/mL (ref 0.350–4.500)

## 2023-10-03 LAB — PREALBUMIN: Prealbumin: 14 mg/dL — ABNORMAL LOW (ref 18–38)

## 2023-10-03 LAB — VITAMIN B12: Vitamin B-12: 3061 pg/mL — ABNORMAL HIGH (ref 180–914)

## 2023-10-03 LAB — CK: Total CK: 21 U/L — ABNORMAL LOW (ref 49–397)

## 2023-10-03 LAB — OSMOLALITY: Osmolality: 284 mosm/kg (ref 275–295)

## 2023-10-03 LAB — FOLATE: Folate: 30.4 ng/mL (ref >5.9)

## 2023-10-03 LAB — PROCALCITONIN: Procalcitonin: <0.1 ng/mL

## 2023-10-03 LAB — LACTIC ACID, PLASMA
Lactic Acid, Venous: 0.8 mmol/L (ref 0.5–1.9)
Lactic Acid, Venous: 1.1 mmol/L (ref 0.5–1.9)

## 2023-10-03 LAB — FERRITIN: Ferritin: 216 ng/mL (ref 24–336)

## 2023-10-03 MED ORDER — SODIUM CHLORIDE 0.9 % IV SOLN: INTRAVENOUS | Status: AC

## 2023-10-03 MED ORDER — MAGNESIUM SULFATE 2 GM/50ML IV SOLN
2.0000 g | Freq: Once | INTRAVENOUS | Status: AC
  Administered 2023-10-03: 2 g via INTRAVENOUS
  Filled 2023-10-03: qty 50

## 2023-10-03 MED ORDER — IPRATROPIUM-ALBUTEROL 0.5-2.5 (3) MG/3ML IN SOLN
3.0000 mL | RESPIRATORY_TRACT | Status: DC | PRN
  Administered 2023-10-03: 3 mL via RESPIRATORY_TRACT
  Filled 2023-10-03: qty 3

## 2023-10-03 MED ORDER — PREDNISOLONE ACETATE 1 % OP SUSP
1.0000 [drp] | Freq: Four times a day (QID) | OPHTHALMIC | Status: DC
Start: 2023-10-03 — End: 2023-10-08
  Administered 2023-10-03 – 2023-10-08 (×21): 1 [drp] via OPHTHALMIC
  Filled 2023-10-03: qty 5

## 2023-10-03 MED ORDER — ACETAMINOPHEN 650 MG RE SUPP: 650.0000 mg | Freq: Four times a day (QID) | RECTAL | Status: DC | PRN

## 2023-10-03 MED ORDER — PREDNISONE 5 MG PO TABS
5.0000 mg | ORAL_TABLET | Freq: Every day | ORAL | Status: DC
  Administered 2023-10-04 – 2023-10-08 (×5): 5 mg via ORAL
  Filled 2023-10-03 (×5): qty 1

## 2023-10-03 MED ORDER — ONDANSETRON HCL 4 MG PO TABS: 4.0000 mg | ORAL_TABLET | Freq: Four times a day (QID) | ORAL | Status: DC | PRN

## 2023-10-03 MED ORDER — METOPROLOL TARTRATE 5 MG/5ML IV SOLN: 5.0000 mg | INTRAVENOUS | Status: DC | PRN

## 2023-10-03 MED ORDER — ENSURE ENLIVE PO LIQD
237.0000 mL | Freq: Two times a day (BID) | ORAL | Status: DC
  Administered 2023-10-03 – 2023-10-08 (×10): 237 mL via ORAL

## 2023-10-03 MED ORDER — SENNOSIDES-DOCUSATE SODIUM 8.6-50 MG PO TABS
1.0000 | ORAL_TABLET | Freq: Every evening | ORAL | Status: DC | PRN
  Administered 2023-10-05 – 2023-10-06 (×2): 1 via ORAL
  Filled 2023-10-03 (×3): qty 1

## 2023-10-03 MED ORDER — HYDRALAZINE HCL 20 MG/ML IJ SOLN: 10.0000 mg | INTRAMUSCULAR | Status: DC | PRN

## 2023-10-03 MED ORDER — ACETAMINOPHEN 325 MG PO TABS
650.0000 mg | ORAL_TABLET | Freq: Four times a day (QID) | ORAL | Status: DC | PRN
  Administered 2023-10-03: 650 mg via ORAL
  Filled 2023-10-03 (×3): qty 2

## 2023-10-03 MED ORDER — ONDANSETRON HCL 4 MG/2ML IJ SOLN: 4.0000 mg | Freq: Four times a day (QID) | INTRAMUSCULAR | Status: DC | PRN

## 2023-10-03 MED ORDER — ALBUTEROL SULFATE (2.5 MG/3ML) 0.083% IN NEBU: 2.5000 mg | INHALATION_SOLUTION | RESPIRATORY_TRACT | Status: DC | PRN

## 2023-10-03 MED ORDER — PREDNISONE 20 MG PO TABS
25.0000 mg | ORAL_TABLET | Freq: Every day | ORAL | Status: DC
  Administered 2023-10-03: 25 mg via ORAL
  Filled 2023-10-03: qty 1

## 2023-10-03 MED ORDER — CALCIUM GLUCONATE-NACL 2-0.675 GM/100ML-% IV SOLN
2.0000 g | Freq: Once | INTRAVENOUS | Status: AC
  Administered 2023-10-03: 2000 mg via INTRAVENOUS
  Filled 2023-10-03: qty 100

## 2023-10-03 MED ORDER — KETOROLAC TROMETHAMINE 0.5 % OP SOLN
1.0000 [drp] | Freq: Four times a day (QID) | OPHTHALMIC | Status: DC
Start: 2023-10-03 — End: 2023-10-08
  Administered 2023-10-03 – 2023-10-08 (×21): 1 [drp] via OPHTHALMIC
  Filled 2023-10-03: qty 5

## 2023-10-03 MED ORDER — HYDROCODONE-ACETAMINOPHEN 5-325 MG PO TABS
1.0000 | ORAL_TABLET | ORAL | Status: DC | PRN
  Administered 2023-10-07: 2 via ORAL
  Filled 2023-10-03: qty 2

## 2023-10-03 MED ORDER — APIXABAN 2.5 MG PO TABS
2.5000 mg | ORAL_TABLET | Freq: Two times a day (BID) | ORAL | Status: DC
  Administered 2023-10-03 – 2023-10-08 (×11): 2.5 mg via ORAL
  Filled 2023-10-03 (×11): qty 1

## 2023-10-03 MED ORDER — MIRABEGRON ER 50 MG PO TB24
50.0000 mg | ORAL_TABLET | Freq: Every day | ORAL | Status: DC
  Administered 2023-10-03 – 2023-10-07 (×5): 50 mg via ORAL
  Filled 2023-10-03 (×6): qty 1

## 2023-10-03 MED ORDER — ALBUTEROL SULFATE HFA 108 (90 BASE) MCG/ACT IN AERS: 2.0000 | INHALATION_SPRAY | RESPIRATORY_TRACT | Status: DC | PRN

## 2023-10-03 MED ORDER — FERROUS SULFATE 325 (65 FE) MG PO TABS
325.0000 mg | ORAL_TABLET | Freq: Every day | ORAL | Status: DC
  Administered 2023-10-04 – 2023-10-08 (×5): 325 mg via ORAL
  Filled 2023-10-03 (×5): qty 1

## 2023-10-03 MED ORDER — PRAVASTATIN SODIUM 40 MG PO TABS
80.0000 mg | ORAL_TABLET | Freq: Every day | ORAL | Status: DC
  Administered 2023-10-03 – 2023-10-07 (×6): 80 mg via ORAL
  Filled 2023-10-03 (×6): qty 2

## 2023-10-03 NOTE — Evaluation (Signed)
Clinical/Bedside Swallow Evaluation Patient Details  Name: Dakota [name] MRN: [id number] Date of Birth: Jul 07, 1926  Today's Date: 10/03/2023 Time: SLP Start Time (ACUTE ONLY): 1037 SLP Stop Time (ACUTE ONLY): 1047 SLP Time Calculation (min) (ACUTE ONLY): 10 min  Past Medical History:  Past Medical History:  Diagnosis Date   Aortic atherosclerosis (HCC)    Arthritis    Atrial fibrillation, chronic (HCC)    Benign localized prostatic hyperplasia with lower urinary tract symptoms (LUTS)    CAD (coronary artery disease)    Chronic chest pain    takes imdur   Chronic combined systolic (congestive) and diastolic (congestive) heart failure (HCC)    followed by cardiology   Chronic dyspnea    Cognitive impairment    COPD, moderate (HCC)    Edema of both lower extremities    takes lasix and uses teds   First degree heart block    GERD (gastroesophageal reflux disease)    Heart murmur    History of cardiovascular stress test    Lexiscan Myoview 7/16:  EF 51%, inferior, inferolateral ischemia; Intermediate Risk   History of smallpox    HOH (hard of hearing)    even with hearing aids   Hypertension    Ischemic heart disease 2005   Dr Tresa Endo a. s/p CABG;  b.  LHC 05/23/15:  S-RPAVB ok, S-D2 ok, S-OM2 99% (s/p Synergy DES), L-LAD ok, EF normal (complicated by pseudoaneurysm)    LBBB (left bundle branch block)    Right arm weakness    S/P CABG x 4    01-08-2004   S/P drug eluting coronary stent placement    05-21-2015  PCI and DES x1 to SVG--OM graft   Thalassemia minor    Type 2 diabetes, diet controlled (HCC)    Wears glasses    Wears hearing aid in both ears    Past Surgical History:  Past Surgical History:  Procedure Laterality Date   APPENDECTOMY     CARDIAC CATHETERIZATION  1989   in [place]   normal     CARDIAC CATHETERIZATION N/A 05/23/2015   Procedure: Left Heart Cath and Cors/Grafts Angiography;  Surgeon: Lennette Bihari, MD;  Location: [facility]ASIVE [facility];  Service:  Cardiovascular;  Laterality: N/A;   CARDIAC CATHETERIZATION N/A 05/23/2015   Procedure: Coronary Stent Intervention;  Surgeon: Lennette Bihari, MD;  Location: [facility]VAS[facility];  Service: Cardiovascular;  Laterality: N/A;   CARDIAC CATHETERIZATION  01-04-2004   dr Melburn Popper  @[facility]    severe 3V CAD, normal LVF   CATARACT EXTRACTION W/ INTRAOCULAR LENS  IMPLANT, BILATERAL     CORONARY ARTERY BYPASS GRAFT  01-08-2004  dr hendrickson @[facility]    LIMA to LAD,  SVG to D1,  SVG to OM,  SVG to PDA   FOOT SURGERY Right    INGUINAL HERNIA REPAIR Bilateral 1978   INGUINAL HERNIA REPAIR Bilateral 05/12/2023   Procedure: HERNIA REPAIR INGUINAL ADULT;  Surgeon: Griselda Miner, MD;  Location: [facility] OR;  Service: General;  Laterality: Bilateral;   INSERTION OF MESH Bilateral 05/12/2023   Procedure: INSERTION OF MESH;  Surgeon: Griselda Miner, MD;  Location[facility] OR;  Service: General;  Laterality: Bilateral;   MASS EXCISION Left 11/20/2019   Procedure: EXCISION OF SEBACEOUS CYST CHEST;  Surgeon: Sheliah Hatch De Blanch, MD;  Location: [facility];  Service: General;  Laterality: Left;   PACEMAKER IMPLANT N/A 08/31/2023   Procedure: PACEMAKER IMPLANT;  Surgeon: Marinus Maw, MD;  Location:  [facility];  Service: Cardiovascular;  Laterality: N/A;   HPI:  Dakota [name] is a 89 y.o. male with medical history significant of CAD, HTN, COPD, DM2, CHF, a.fib on eliquis, complete heart block status post pacemaker.  Presented with diarrhea and fatigue/  ent history significant for admission for complete heart block on 3 November requiring pacemaker placed on the fifth.  Ptient postoperative course complicated by hematoma for which she was treated with Keflex for 1 week.  Now back on Eliquis.  He developed severe diarrhea and has became more fatigued.  No associated fevers.  CT of the abdomen/pelvis showing in the lower chest no acute findings.  Chest xray showing no acute cardiopulmonary issues. Patient is from home with  his daughter.  They are now agreeable to short term rehab stay.    Assessment / Plan / Recommendation  Clinical Impression  Clinical swallowing evaluation was completed using thin liquids via spoon, cup and straw, pureed material and dry solids.  RN approved patient for PO intake and stated that there had been no reports of issues swallowing.  The patient nor his daughter reported any swallowing issues.  His oral and pharyngeal swallow appeared to be functional.  Mastication of dry solids appeared adequate with good oral clearance.  Swallow trigger was appreciated to palpation and overt s/s of aspiration were not seen even given serial sips via straw.  Suggest he remain on a regular textured diet with thin liquids.  ST will follow briefly for therapeutic diet tolerance.  Cognitive/linguistic evaluation is also pending and will be completed at time allows. SLP Visit Diagnosis: Dysphagia, unspecified (R13.10)    Aspiration Risk  Mild aspiration risk    Diet Recommendation   Age appropriate regular;Thin  Medication Administration: Whole meds with liquid    Other  Recommendations Oral Care Recommendations: Oral care BID    Recommendations for follow up therapy are one component of a multi-disciplinary discharge planning process, led by the attending physician.  Recommendations may be updated based on patient status, additional functional criteria and insurance authorization.  Follow up Recommendations Other (comment) (TBD)      Assistance Recommended at Discharge  TBD  Functional Status Assessment Patient has had a recent decline in their functional status and demonstrates the ability to make significant improvements in function in a reasonable and predictable amount of time.  Frequency and Duration min 1 x/week  2 weeks       Prognosis Prognosis for improved oropharyngeal function: Good      Swallow Study   General Date of Onset: 10/02/23 HPI: Dakota [name] is a 88 y.o. male with  medical history significant of CAD, HTN, COPD, DM2, CHF, a.fib on eliquis, complete heart block status post pacemaker.  Presented with diarrhea and fatigue/  ent history significant for admission for complete heart block on 3 November requiring pacemaker placed on the fifth.  Ptient postoperative course complicated by hematoma for which she was treated with Keflex for 1 week.  Now back on Eliquis.  He developed severe diarrhea and has became more fatigued.  No associated fevers.  CT of the abdomen/pelvis showing in the lower chest no acute findings.  Chest xray showing no acute cardiopulmonary issues. Patient is from home with his daughter.  They are now agreeable to short term rehab stay. Type of Study: Bedside Swallow Evaluation Previous Swallow Assessment: None noted at [facility]. Diet Prior to this Study: Regular;Thin liquids (Level 0) Temperature Spikes Noted: No Respiratory Status: Nasal  cannula History of Recent Intubation: No Behavior/Cognition: Cooperative;Alert;Pleasant mood Oral Cavity Assessment: Within Functional Limits Oral Care Completed by SLP: No Oral Cavity - Dentition: Adequate natural dentition Vision: Functional for self-feeding Self-Feeding Abilities: Needs assist Patient Positioning: Upright in chair Baseline Vocal Quality: Normal Volitional Swallow: Able to elicit    Oral/Motor/Sensory Function Overall Oral Motor/Sensory Function: Within functional limits   Ice Chips Ice chips: Not tested   Thin Liquid Thin Liquid: Within functional limits Presentation: Cup;Self Fed;Spoon;Straw    Nectar Thick Nectar Thick Liquid: Not tested   Honey Thick Honey Thick Liquid: Not tested   Puree Puree: Within functional limits Presentation: Spoon   Solid     Solid: Within functional limits Presentation: Self Fed     Dimas Aguas, MA, CCC-SLP Acute Rehab SLP [phone]  Fleet Contras 10/03/2023,10:54 AM

## 2023-10-03 NOTE — ED Notes (Signed)
ED TO INPATIENT HANDOFF REPORT  ED Nurse Name and Phone #:  Corliss Blacker, RN [phone]  S Name/Age/Gender Dakota [name] 89 y.o. male Room/Bed: 019C/019C  Code Status   Code Status: Full Code  Home/SNF/Other Home Patient oriented to: self, place, and situation Is this baseline? Yes   Triage Complete: Triage complete  Chief Complaint Lactic acidosis [E87.20]  Triage Note Pt BIB EMS from home after family called out for lethargy, family said he has hx of UTI. EMS noted pt uses urinal attached to leg bag and urine was dark. Hx of pacemaker placement on left side, so left arm restriction. Bed bound at baseline.    Allergies Allergies  Allergen Reactions   Aceon [Perindopril Erbumine] Shortness Of Breath and Cough   Sulfa Antibiotics Swelling   Pork-Derived Products Other (See Comments)    muslim    Level of Care/Admitting Diagnosis ED Disposition     ED Disposition  Admit   Condition  --   Comment  Hospital Area: [facility] [[id number]]  Level of Care: Progressive [102]  Admit to Progressive based on following criteria: MULTISYSTEM THREATS such as stable sepsis, metabolic/electrolyte imbalance with or without encephalopathy that is responding to early treatment.  May place patient in observation at [facility] or [facility] if equivalent level of care is available:: No  Covid Evaluation: Asymptomatic - no recent exposure (last 10 days) testing not required  Diagnosis: Lactic acidosis [413244]  Admitting Physician: Therisa Doyne [[id number]]  Attending Physician: Therisa Doyne [[id number]]          B Medical/Surgery History Past Medical History:  Diagnosis Date   Aortic atherosclerosis (HCC)    Arthritis    Atrial fibrillation, chronic (HCC)    Benign localized prostatic hyperplasia with lower urinary tract symptoms (LUTS)    CAD (coronary artery disease)    Chronic chest pain    takes imdur   Chronic combined systolic (congestive) and diastolic  (congestive) heart failure (HCC)    followed by cardiology   Chronic dyspnea    Cognitive impairment    COPD, moderate (HCC)    Edema of both lower extremities    takes lasix and uses teds   First degree heart block    GERD (gastroesophageal reflux disease)    Heart murmur    History of cardiovascular stress test    Lexiscan Myoview 7/16:  EF 51%, inferior, inferolateral ischemia; Intermediate Risk   History of smallpox    HOH (hard of hearing)    even with hearing aids   Hypertension    Ischemic heart disease 2005   Dr Tresa Endo a. s/p CABG;  b.  LHC 05/23/15:  S-RPAVB ok, S-D2 ok, S-OM2 99% (s/p Synergy DES), L-LAD ok, EF normal (complicated by pseudoaneurysm)    LBBB (left bundle branch block)    Right arm weakness    S/P CABG x 4    01-08-2004   S/P drug eluting coronary stent placement    05-21-2015  PCI and DES x1 to SVG--OM graft   Thalassemia minor    Type 2 diabetes, diet controlled (HCC)    Wears glasses    Wears hearing aid in both ears    Past Surgical History:  Procedure Laterality Date   APPENDECTOMY     CARDIAC CATHETERIZATION  1989   in [place]   normal     CARDIAC CATHETERIZATION N/A 05/23/2015   Procedure: Left Heart Cath and Cors/Grafts Angiography;  Surgeon: Lennette Bihari, MD;  Location:  [facility] INVASIVE CV [facility];  Service: Cardiovascular;  Laterality: N/A;   CARDIAC CATHETERIZATION N/A 05/23/2015   Procedure: Coronary Stent Intervention;  Surgeon: Lennette Bihari, MD;  Location: [facility]VASIVE CV LAB;  Service: Cardiovascular;  Laterality: N/A;   CARDIAC CATHETERIZATION  01-04-2004   dr Melburn Popper  @[facility]    severe 3V CAD, normal LVF   CATARACT EXTRACTION W/ INTRAOCULAR LENS  IMPLANT, BILATERAL     CORONARY ARTERY BYPASS GRAFT  01-08-2004  dr hendrickson @[facility]    LIMA to LAD,  SVG to D1,  SVG to OM,  SVG to PDA   FOOT SURGERY Right    INGUINAL HERNIA REPAIR Bilateral 1978   INGUINAL HERNIA REPAIR Bilateral 05/12/2023   Procedure: HERNIA REPAIR INGUINAL ADULT;  Surgeon:  Griselda Miner, MD;  Location: [facility] OR;  Service: General;  Laterality: Bilateral;   INSERTION OF MESH Bilateral 05/12/2023   Procedure: INSERTION OF MESH;  Surgeon: Griselda Miner, MD;  Location: [facility] OR;  Service: General;  Laterality: Bilateral;   MASS EXCISION Left 11/20/2019   Procedure: EXCISION OF SEBACEOUS CYST CHEST;  Surgeon: Sheliah Hatch De Blanch, MD;  Location: [facility]  Service: General;  Laterality: Left;   PACEMAKER IMPLANT N/A 08/31/2023   Procedure: PACEMAKER IMPLANT;  Surgeon: Marinus Maw, MD;  Location: [facility];  Service: Cardiovascular;  Laterality: N/A;     A IV Location/Drains/Wounds Patient Lines/Drains/Airways Status     Active Line/Drains/Airways     Name Placement date Placement time Site Days   Peripheral IV 10/02/23 20 G Right Antecubital 10/02/23  1245  Antecubital  1            Intake/Output Last 24 hours No intake or output data in the 24 hours ending 10/03/23 0026  Labs/Imaging Results for orders placed or performed during the hospital encounter of 10/02/23 (from the past 48 hour(s))  CBC with Differential     Status: Abnormal   Collection Time: 10/02/23  1:40 PM  Result Value Ref Range   WBC 5.7 4.0 - 10.5 K/uL   RBC 5.19 4.22 - 5.81 MIL/uL   Hemoglobin 10.1 (L) 13.0 - 17.0 g/dL   HCT 09.8 (L) 11.9 - 14.7 %   MCV 62.4 (L) 80.0 - 100.0 fL   MCH 19.5 (L) 26.0 - 34.0 pg   MCHC 31.2 30.0 - 36.0 g/dL   RDW 82.9 (H) 56.2 - 13.0 %   Platelets 175 150 - 400 K/uL    Comment: REPEATED TO VERIFY   nRBC 0.0 0.0 - 0.2 %   Neutrophils Relative % 74 %   Neutro Abs 4.2 1.7 - 7.7 K/uL   Lymphocytes Relative 24 %   Lymphs Abs 1.4 0.7 - 4.0 K/uL   Monocytes Relative 1 %   Monocytes Absolute 0.1 0.1 - 1.0 K/uL   Eosinophils Relative 1 %   Eosinophils Absolute 0.1 0.0 - 0.5 K/uL   Basophils Relative 0 %   Basophils Absolute 0.0 0.0 - 0.1 K/uL   nRBC 0 0 /100 WBC   Abs Immature Granulocytes 0.00 0.00 - 0.07 K/uL   Tear Drop  Cells PRESENT    Target Cells PRESENT     Comment: Performed at [facility], [address] [place], [place] [address]  Comprehensive metabolic panel     Status: Abnormal   Collection Time: 10/02/23  1:40 PM  Result Value Ref Range   Sodium 131 (L) 135 - 145 mmol/L   Potassium 3.8 3.5 -  5.1 mmol/L   Chloride 97 (L) 98 - 111 mmol/L   CO2 23 22 - 32 mmol/L   Glucose, Bld 182 (H) 70 - 99 mg/dL    Comment: Glucose reference range applies only to samples taken after fasting for at least 8 hours.   BUN 16 8 - 23 mg/dL   Creatinine, Ser 6.06 (L) 0.61 - 1.24 mg/dL   Calcium 8.6 (L) 8.9 - 10.3 mg/dL   Total Protein 5.8 (L) 6.5 - 8.1 g/dL   Albumin 3.2 (L) 3.5 - 5.0 g/dL   AST 25 15 - 41 U/L   ALT 16 0 - 44 U/L   Alkaline Phosphatase 49 38 - 126 U/L   Total Bilirubin 0.9 <1.2 mg/dL   GFR, Estimated >30 >16 mL/min    Comment: (NOTE) Calculated using the CKD-EPI Creatinine Equation (2021)    Anion gap 11 5 - 15    Comment: Performed at [facility], [address] [place], [place] [address]  Lactic acid, plasma     Status: Abnormal   Collection Time: 10/02/23  1:40 PM  Result Value Ref Range   Lactic Acid, Venous 2.5 (HH) 0.5 - 1.9 mmol/L    Comment: CRITICAL RESULT CALLED TO, READ BACK BY AND VERIFIED WITH  C. Schilling RN , @1446 , 10/02/23 , [place], T. Performed at [facility], [address] [place], [place] [address]   Reticulocytes     Status: None   Collection Time: 10/02/23  1:59 PM  Result Value Ref Range   Retic Ct Pct 1.0 0.4 - 3.1 %   RBC. 5.18 4.22 - 5.81 MIL/uL   Retic Count, Absolute 53.9 19.0 - 186.0 K/uL   Immature Retic Fract 13.0 2.3 - 15.9 %    Comment: Performed at [facility], [address] [place], [place] [address]  Urinalysis, Routine w reflex microscopic -Urine, Clean Catch     Status: Abnormal   Collection Time: 10/02/23  5:36 PM  Result Value Ref Range   Color, Urine STRAW (A) YELLOW   APPearance CLEAR CLEAR   Specific  Gravity, Urine 1.010 1.005 - 1.030   pH 7.0 5.0 - 8.0   Glucose, UA NEGATIVE NEGATIVE mg/dL   Hgb urine dipstick NEGATIVE NEGATIVE   Bilirubin Urine NEGATIVE NEGATIVE   Ketones, ur NEGATIVE NEGATIVE mg/dL   Protein, ur NEGATIVE NEGATIVE mg/dL   Nitrite NEGATIVE NEGATIVE   Leukocytes,Ua NEGATIVE NEGATIVE    Comment: Performed at [facility], [address], [place], [place] [address]  Hemoglobin A1c     Status: Abnormal   Collection Time: 10/02/23 11:30 PM  Result Value Ref Range   Hgb A1c MFr Bld 6.8 (H) 4.8 - 5.6 %    Comment: (NOTE) Pre diabetes:          5.7%-6.4%  Diabetes:              >6.4%  Glycemic control for   <7.0% adults with diabetes    Mean Plasma Glucose 148.46 mg/dL    Comment: Performed at [facility], [address], [place], [place] [address]   CT ABDOMEN PELVIS W CONTRAST  Result Date: 10/02/2023 CLINICAL DATA:  Lethargy.  History of UTI. EXAM: CT ABDOMEN AND PELVIS WITH CONTRAST TECHNIQUE: Multidetector CT imaging of the abdomen and pelvis was performed using the standard protocol following bolus administration of intravenous contrast. RADIATION DOSE REDUCTION: This exam was performed according to the departmental dose-optimization program which includes  automated exposure control, adjustment of the mA and/or kV according to patient size and/or use of iterative reconstruction technique. CONTRAST:  75mL OMNIPAQUE IOHEXOL 350 MG/ML SOLN COMPARISON:  05/10/2023. FINDINGS: Lower chest: No acute abnormality. Hepatobiliary: No focal liver abnormality is seen. No gallstones, gallbladder wall thickening, or biliary dilatation. Pancreas: Unremarkable. No pancreatic ductal dilatation or surrounding inflammatory changes. Spleen: Normal in size without focal abnormality. Adrenals/Urinary Tract: No adrenal mass. Kidneys normal in overall size, orientation and position with symmetric enhancement and excretion. Bilateral stable renal cysts measuring up to 1.8 cm. No  follow-up indicated. No stones. Mild prominence of both intrarenal collecting systems and proximal ureters. No ureteral stone. Ureters not well-defined, but grossly normal in course and in caliber. Mild prominence of the bladder wall without overt thickening. No bladder mass or stone. Stomach/Bowel: Stomach is decompressed. There is [name] attenuation infiltrating the fat surrounding the stomach extending to the adjacent transverse colon. No convincing stomach wall thickening. Small bowel and colon are normal in caliber. No wall thickening or convincing inflammation. Vascular/Lymphatic: Aortic atherosclerosis. No aneurysm. No enlarged lymph nodes. Reproductive: Heterogeneous prostate, mildly enlarged. Other: No abdominal wall hernia or abnormality. No abdominopelvic ascites. Musculoskeletal: No fracture or acute finding. Skeletal structures are demineralized. There is a mottled type appearance of the skeleton with small ill-defined lucencies that are felt to be due to the diffuse bony demineralization. No defined bone lesion and no change. IMPRESSION: 1. There is hazy [name]-attenuation fat surrounds the stomach mass suggests inflammation/edema, but there is no evidence of stomach wall thickening. This still may reflect gastritis, but is nonspecific. 2. Mild prominence of the intrarenal collecting systems and proximal ureters. Bladder wall appears prominent but not overly thickened. Consider cystitis in the proper clinical setting. No CT evidence of pyelonephritis. 3. No other evidence of an acute abnormality within the abdomen or pelvis. 4. Aortic atherosclerosis. Aortic Atherosclerosis (ICD10-I70.0). Electronically Signed   By: Amie Portland M.D.   On: 10/02/2023 16:59   DG Chest Portable 1 View  Result Date: 10/02/2023 CLINICAL DATA:  Altered mental status. EXAM: PORTABLE CHEST 1 VIEW COMPARISON:  09/01/2023 FINDINGS: There is a left chest wall single lead pacer device with lead terminating in the right ventricle.  Status post median sternotomy CABG procedure. Stable cardiomediastinal contours. Blunting of the left costophrenic angle identified, unchanged from previous exam. Scar like densities noted in the left base. No signs of interstitial edema, airspace consolidation or pneumothorax. Bilateral glenohumeral joint osteoarthritis with marked narrowing of the acromial humeral interval bilaterally. IMPRESSION: 1. No acute cardiopulmonary abnormalities. 2. Chronic blunting of the left costophrenic angle. 3. Bilateral glenohumeral joint osteoarthritis with marked narrowing of the acromial humeral interval bilaterally. Electronically Signed   By: Signa Kell M.D.   On: 10/02/2023 13:34    Pending Labs Unresulted Labs (From admission, onward)     Start     Ordered   10/03/23 0500  Vitamin B12  (Anemia Panel (PNL))  Tomorrow morning,   R        10/02/23 2244   10/03/23 0500  Folate  (Anemia Panel (PNL))  Tomorrow morning,   R        10/02/23 2244   10/03/23 0500  Prealbumin  Tomorrow morning,   R        10/02/23 2244   10/02/23 2247  Basic metabolic panel  Now then every 6 hours,   R (with TIMED occurrences)      10/02/23 2246   10/02/23 2247  Magnesium  Once,  R       Question:  Release to patient  Answer:  Immediate   10/02/23 2246   10/02/23 2247  Phosphorus  Once,   R        10/02/23 2246   10/02/23 2246  Lactic acid, plasma  (Lactic Acid)  STAT Now then every 3 hours,   R (with STAT occurrences)      10/02/23 2245   10/02/23 2245  Iron and TIBC  (Anemia Panel (PNL))  Add-on,   AD        10/02/23 2244   10/02/23 2245  Ferritin  (Anemia Panel (PNL))  Add-on,   AD        10/02/23 2244   10/02/23 2245  Procalcitonin  Add-on,   AD       References:    Procalcitonin Lower Respiratory Tract Infection AND Sepsis Procalcitonin Algorithm   10/02/23 2244   10/02/23 2245  Blood gas, venous  ONCE - STAT,   STAT       Question:  Release to patient  Answer:  Immediate   10/02/23 2244   10/02/23 2245  CK   Add-on,   AD        10/02/23 2244   10/02/23 2245  Osmolality, urine  Once,   URGENT        10/02/23 2244   10/02/23 2245  Osmolality  Add-on,   AD        10/02/23 2244   10/02/23 2245  Creatinine, urine, random  Once,   URGENT        10/02/23 2244   10/02/23 2245  Sodium, urine, random  Once,   URGENT        10/02/23 2244   10/02/23 2245  TSH  Add-on,   AD        10/02/23 2244   10/02/23 2245  Hepatic function panel  Add-on,   AD       Question:  Release to patient  Answer:  Immediate   10/02/23 2244   10/02/23 1300  Gastrointestinal Panel by PCR , Stool  (Gastrointestinal Panel by PCR, Stool                                                                                                                                                     **Does Not include CLOSTRIDIUM DIFFICILE testing. **If CDIFF testing is needed, place order from the "C Difficile Testing" order set.**)  Once,   URGENT        10/02/23 1259   10/02/23 1300  C Difficile Quick Screen w PCR reflex  (C Difficile quick screen w PCR reflex panel )  Once, for 24 hours,   URGENT       References:    CDiff Information Tool   10/02/23 1259   10/02/23 1259  Culture, blood (  routine x 2)  BLOOD CULTURE X 2,   R (with STAT occurrences)      10/02/23 1258   10/02/23 1259  Urine Culture  Once,   URGENT       Question:  Indication  Answer:  Altered mental status (if no other cause identified)   10/02/23 1259   Signed and Held  Magnesium  Tomorrow morning,   R        Signed and Held   Signed and Held  Phosphorus  Tomorrow morning,   R        Signed and Held   Signed and Held  Comprehensive metabolic panel  Tomorrow morning,   R       Question:  Release to patient  Answer:  Immediate   Signed and Held   Signed and Held  CBC  Tomorrow morning,   R       Question:  Release to patient  Answer:  Immediate   Signed and Held            Vitals/Pain Today's Vitals   10/02/23 1241 10/02/23 1700 10/02/23 1900 10/02/23 2005  BP:   (!) 118/59 (!) 113/56   Pulse:  69 71   Resp:  17 16   Temp: 97.6 F (36.4 C)   97.9 F (36.6 C)  TempSrc: Rectal   Oral  SpO2:  100% 100%   Weight:      Height:      PainSc:        Isolation Precautions Enteric precautions (UV disinfection)  Medications Medications  insulin aspart (novoLOG) injection 0-9 Units (has no administration in time range)  pantoprazole (PROTONIX) injection 40 mg (has no administration in time range)  sodium chloride 0.9 % bolus 1,000 mL (1,000 mLs Intravenous New Bag/Given 10/02/23 1349)  iohexol (OMNIPAQUE) 350 MG/ML injection 75 mL (75 mLs Intravenous Contrast Given 10/02/23 1636)    Mobility non-ambulatory     Focused Assessments    R Recommendations: See Admitting Provider Note  Report given to:   Additional Notes:  Patient is hard of hearing.

## 2023-10-03 NOTE — Evaluation (Signed)
Occupational Therapy Evaluation Patient Details Name: Dakota [name] MRN: [id number] DOB: Feb 25, 1926 Today's Date: 10/03/2023   History of Present Illness Pt is a 89 y.o. male admitted 10/02/23 with diarrhea, fatigue. Workup for dehydration, hyponatremia. PMH includes CHB (s/p PPM 08/31/23), CAD (s/p CABG), HTN, COPD, DM2, CHF, afib on Eliquis, chronic hernia.   Clinical Impression   At baseline, pt lives with his daughter who assists pt with ADLs, IADLs, and functional transfers/mobility with a RW. Pt now presents with decreased activity tolerance, decreased balance during functional tasks, generalized B UE weakness, and decreased safety and independence with functional tasks. Pt currently demonstrates ability to complete UB ADLs with Set up to Mod assist, LB ADLs with Max +2 to Total +2 assist, and functional tranfers with a RW with Min +2 to Max +2 assist for safety. Pt will benefit from acute skilled OT services to address deficits outlined below, decrease caregiver burden, and increase safety and independence with functional tasks. Post acute discharge, pt will benefit from intensive inpatient skilled rehab services < 3 hours per day to maximize rehab potential.       If plan is discharge home, recommend the following: Two people to help with walking and/or transfers;Two people to help with bathing/dressing/bathroom;Assistance with cooking/housework;Assistance with feeding;Direct supervision/assist for medications management;Direct supervision/assist for financial management;Assist for transportation;Help with stairs or ramp for entrance (Set up to Supervision for self-feeding)    Functional Status Assessment  Patient has had a recent decline in their functional status and demonstrates the ability to make significant improvements in function in a reasonable and predictable amount of time.  Equipment Recommendations  Other (comment) (defer to next level of care)    Recommendations for Other  Services       Precautions / Restrictions Precautions Precautions: Fall Precaution Comments: Pacemaker placement 08/31/23 Restrictions Weight Bearing Restrictions: No      Mobility Bed Mobility Overal bed mobility: Needs Assistance Bed Mobility: Supine to Sit     Supine to sit: Mod assist          Transfers Overall transfer level: Needs assistance Equipment used: Rolling walker (2 wheels) Transfers: Sit to/from Stand, Bed to chair/wheelchair/BSC Sit to Stand: Mod assist     Step pivot transfers: Mod assist, Max assist, +2 safety/equipment     General transfer comment: min-modA for trunk elevation standing from EOB to RW; pivotal steps from bed to recliner with min-modA, pt attempting to sit prematurely requiring maxA for controlled sitting      Balance Overall balance assessment: Needs assistance Sitting-balance support: No upper extremity supported, Single extremity supported Sitting balance-Leahy Scale: Poor Sitting balance - Comments: reliant on UE support or minA to maintain static sitting balance, brief bouts without support though pt appears fatigued with posterior LOB; assist+2 to scoot hips back in recliner   Standing balance support: Bilateral upper extremity supported, Reliant on assistive device for balance Standing balance-Leahy Scale: Poor Standing balance comment: reliant on BUE support and minA to maintain static stand while BP checked                           ADL either performed or assessed with clinical judgement   ADL Overall ADL's : Needs assistance/impaired Eating/Feeding: Set up;Supervision/ safety;Sitting   Grooming: Minimal assistance;Sitting;Cueing for sequencing   Upper Body Bathing: Moderate assistance;Sitting;Cueing for sequencing   Lower Body Bathing: Maximal assistance;+2 for safety/equipment;Sit to/from stand;Cueing for sequencing;Cueing for safety   Upper Body Dressing : Minimal  assistance;Sitting;Cueing for  sequencing   Lower Body Dressing: Maximal assistance;+2 for safety/equipment;Cueing for sequencing;Cueing for compensatory techniques;Cueing for safety;Sit to/from stand;Sitting/lateral leans   Toilet Transfer: Moderate assistance;Maximal assistance;+2 for safety/equipment;BSC/3in1;Rolling walker (2 wheels) (step-pivot transfer) Toilet Transfer Details (indicate cue type and reason): largely min-modA +2, but pt attempting to sit prematurely requiring maxA +2 for controlled sitting Toileting- Clothing Manipulation and Hygiene: Total assistance;+2 for safety/equipment;Sit to/from stand         General ADL Comments: Pt with decreased activity tolerance and fatiguing quickly during functional tasks     Vision Baseline Vision/History: 1 Wears glasses Ability to See in Adequate Light: 0 Adequate (with glasses) Patient Visual Report: No change from baseline       Perception         Praxis         Pertinent Vitals/Pain Pain Assessment Pain Assessment: Faces Faces Pain Scale: Hurts little more Pain Location: foot, Pain Descriptors / Indicators: Discomfort Pain Intervention(s): Limited activity within patient's tolerance, Monitored during session, Repositioned (RN aware and inspected during session)     Extremity/Trunk Assessment Upper Extremity Assessment Upper Extremity Assessment: Right hand dominant;Generalized weakness;RUE deficits/detail;LUE deficits/detail RUE Deficits / Details: Impaired shoulder flexion at baseline; generalized weakness LUE Deficits / Details: Impaired shoulder flexion at baseline; generalized weakness   Lower Extremity Assessment Lower Extremity Assessment: Defer to PT evaluation   Cervical / Trunk Assessment Cervical / Trunk Assessment: Kyphotic   Communication Communication Communication: Hearing impairment Cueing Techniques: Verbal cues;Gestural cues;Tactile cues   Cognition Arousal: Alert Behavior During Therapy: WFL for tasks  assessed/performed Overall Cognitive Status: Difficult to assess                                 General Comments: pt answering simple questions and following majority of gestural commands appropriately, limited by [facility]. pleasant and cooperative, able to make needs known     General Comments  Pt reporting dizziness in sitting with BP soft but stable (from 95/53 supine to 100/79 standing), HR in the 60s to 70s, and O2 sat >/90% on RA throughout session. Pt's daughter present throughout session and RN present during a portion of session. Discussed POC and recommendations with daughter and pt with daughter verbalizing understanding and agreeable to short-term rehab at discharge.    Exercises     Shoulder Instructions      Home Living Family/patient expects to be discharged to:: Skilled nursing facility Living Arrangements: Children Available Help at Discharge: Family;Available 24 hours/day Type of Home: Apartment Home Access: Elevator     Home Layout: One level     Bathroom Shower/Tub: Chief Strategy Officer: Standard     Home Equipment: Tub bench;Cane - single Librarian, academic (2 wheels);Rollator (4 wheels);Hospital bed   Additional Comments: lives with daughter who had recent foot sx (currently using w/c); daughter now agreeable to short-term SNF rehab for pt      Prior Functioning/Environment Prior Level of Function : Needs assist             Mobility Comments: walking short distances with walker and HHPT; had fall when attempting to get up with daughter ADLs Comments: daughter does IADLs, assists pt with ADLs        OT Problem List: Decreased strength;Decreased activity tolerance;Impaired balance (sitting and/or standing);Decreased safety awareness;Decreased knowledge of use of DME or AE      OT Treatment/Interventions: Self-care/ADL training;Therapeutic exercise;DME and/or AE instruction;Therapeutic  activities;Patient/family education     OT Goals(Current goals can be found in the care plan section) Acute Rehab OT Goals Patient Stated Goal: pt did not state; daughter would like for pt to get stronger and then return home OT Goal Formulation: With patient/family Time For Goal Achievement: 10/17/23 Potential to Achieve Goals: Good ADL Goals Pt Will Perform Grooming: sitting;with supervision (sitting EOB for 3 or more minutes with Good balance) Pt Will Perform Upper Body Bathing: with contact guard assist;sitting Pt Will Perform Lower Body Bathing: sitting/lateral leans;sit to/from stand;with mod assist Pt Will Perform Lower Body Dressing: with mod assist;sitting/lateral leans;sit to/from stand Pt Will Transfer to Toilet: with contact guard assist;ambulating;bedside commode (with least restrictive AD) Pt Will Perform Toileting - Clothing Manipulation and hygiene: sit to/from stand;sitting/lateral leans;with mod assist  OT Frequency: Min 1X/week    Co-evaluation PT/OT/SLP Co-Evaluation/Treatment: Yes Reason for Co-Treatment: For patient/therapist safety;To address functional/ADL transfers PT goals addressed during session: Mobility/safety with mobility;Balance;Proper use of DME OT goals addressed during session: ADL's and self-care      AM-PAC OT "6 Clicks" Daily Activity     Outcome Measure Help from another person eating meals?: A Little Help from another person taking care of personal grooming?: A Little Help from another person toileting, which includes using toliet, bedpan, or urinal?: Total Help from another person bathing (including washing, rinsing, drying)?: A Lot Help from another person to put on and taking off regular upper body clothing?: A Little Help from another person to put on and taking off regular lower body clothing?: A Lot 6 Click Score: 14   End of Session Equipment Utilized During Treatment: Gait belt;Rolling walker (2 wheels) Nurse Communication: Mobility status;Other (comment) (vital  signs)  Activity Tolerance: Patient tolerated treatment well Patient left: in chair;with call bell/phone within reach;with chair alarm set;with family/visitor present;with nursing/sitter in room  OT Visit Diagnosis: Unsteadiness on feet (R26.81);Other abnormalities of gait and mobility (R26.89);History of falling (Z91.81);Muscle weakness (generalized) (M62.81);Other (comment) (decreased activity tolerance)                Time: 0865-7846 OT Time Calculation (min): 25 min Charges:  OT General Charges $OT Visit: 1 Visit OT Evaluation $OT Eval Low Complexity: 1 Low  [place] Derusha "Kyle" M., OTR/L, MA Acute Rehab [phone]   Lendon Colonel 10/03/2023, 1:09 PM

## 2023-10-03 NOTE — Evaluation (Signed)
Physical Therapy Evaluation Patient Details Name: Dakota [name] MRN: [id number] DOB: [birth year] Today's Date: 10/03/2023  History of Present Illness  89 y.o. male admitted 10/02/23 with diarrhea, fatigue. Workup for dehydration, hyponatremia. PMH includes CHB (s/p PPM 08/31/23), CAD (s/p CABG), HTN, COPD, DM2, CHF, afib on Eliquis, chronic hernia.   Clinical Impression  Pt presents with an overall decrease in functional mobility secondary to above. PTA, pt limited household ambulator with walker, walking ~10' with assist from HHPT; lives with daughter who is currently using w/c s/p foot sx. Today, pt requiring min-maxA for brief bouts of standing activity with RW, quick to fatigue requiring seated rest. Pt would benefit from SNF-level therapies to maximize functional mobility and independence prior to return home. Will follow acutely to address established goals.    If plan is discharge home, recommend the following: A lot of help with walking and/or transfers;A lot of help with bathing/dressing/bathroom;Assistance with cooking/housework;Direct supervision/assist for medications management;Direct supervision/assist for financial management;Assist for transportation;Help with stairs or ramp for entrance   Can travel by private vehicle   No    Equipment Recommendations  (defer to next venue)  Recommendations for Other Services       Functional Status Assessment Patient has had a recent decline in their functional status and demonstrates the ability to make significant improvements in function in a reasonable and predictable amount of time.     Precautions / Restrictions Precautions Precautions: Fall Restrictions Weight Bearing Restrictions: No      Mobility  Bed Mobility Overal bed mobility: Needs Assistance Bed Mobility: Supine to Sit     Supine to sit: Mod assist          Transfers Overall transfer level: Needs assistance Equipment used: Rolling walker (2 wheels) Transfers:  Sit to/from Stand, Bed to chair/wheelchair/BSC Sit to Stand: Mod assist   Step pivot transfers: Mod assist, +2 safety/equipment, Max assist       General transfer comment: min-modA for trunk elevation standing from EOB to RW; pivotal steps from bed to recliner with min-modA, pt attempting to sit prematurely requiring maxA for controlled sitting    Ambulation/Gait                  Stairs            Wheelchair Mobility     Tilt Bed    Modified Rankin (Stroke Patients Only)       Balance Overall balance assessment: Needs assistance Sitting-balance support: No upper extremity supported, Single extremity supported Sitting balance-Leahy Scale: Poor Sitting balance - Comments: reliant on UE support or minA to maintain static sitting balance, brief bouts without support though pt appears fatigued with posterior LOB; assist+2 to scoot hips back in recliner   Standing balance support: Bilateral upper extremity supported, Reliant on assistive device for balance Standing balance-Leahy Scale: Poor Standing balance comment: reliant on BUE support and minA to maintain static stand while BP checked                             Pertinent Vitals/Pain Pain Assessment Pain Assessment: Faces Faces Pain Scale: Hurts little more Pain Location: foot, Pain Descriptors / Indicators: Discomfort Pain Intervention(s): Monitored during session, Other (comment), Repositioned (RN aware and inspected during session)    Home Living Family/patient expects to be discharged to:: Skilled nursing facility Living Arrangements: Children Available Help at Discharge: Family;Available 24 hours/day Type of Home: Apartment Home Access: Elevator  Home Layout: One level Home Equipment: Tub bench;Cane - single Librarian, academic (2 wheels);Rollator (4 wheels);Hospital bed Additional Comments: lives with daughter who had recent foot sx (currently using w/c); daughter now agreeable  to short-term SNF rehab for pt    Prior Function Prior Level of Function : Needs assist             Mobility Comments: walking short distances with walker and HHPT; had fall when attempting to get up with daughter ADLs Comments: daughter does iADLs, assists pt with bathing     Extremity/Trunk Assessment   Upper Extremity Assessment Upper Extremity Assessment: Generalized weakness    Lower Extremity Assessment Lower Extremity Assessment: Generalized weakness    Cervical / Trunk Assessment Cervical / Trunk Assessment: Kyphotic  Communication   Communication Communication: Hearing impairment Cueing Techniques: Verbal cues;Gestural cues;Tactile cues  Cognition Arousal: Alert Behavior During Therapy: WFL for tasks assessed/performed Overall Cognitive Status: Difficult to assess                                 General Comments: pt answering simple questions and following majority of gestural commands appropriately, limited by [facility]. pleasant and cooperative, able to make needs known        General Comments General comments (skin integrity, edema, etc.): pt endorses some dizziness with sitting EOB; BP soft but negative orthostatic (from 95/53 supine to 100/79 standing). pt's daughter present and supportive. educ pt and daughter re: POC, activity recommendations, discharge needs; daughter agreeable to SNF rehab    Exercises     Assessment/Plan    PT Assessment Patient needs continued PT services  PT Problem List Decreased strength;Decreased activity tolerance;Decreased balance;Decreased mobility;Cardiopulmonary status limiting activity;Decreased knowledge of use of DME       PT Treatment Interventions DME instruction;Gait training;Therapeutic activities;Functional mobility training;Therapeutic exercise;Balance training;Wheelchair mobility training;Patient/family education    PT Goals (Current goals can be found in the Care Plan section)  Acute Rehab PT  Goals Patient Stated Goal: regain strength at SNF PT Goal Formulation: With patient/family Time For Goal Achievement: 10/17/23 Potential to Achieve Goals: Good    Frequency Min 1X/week     Co-evaluation PT/OT/SLP Co-Evaluation/Treatment: Yes Reason for Co-Treatment: For patient/therapist safety;To address functional/ADL transfers PT goals addressed during session: Mobility/safety with mobility;Balance;Proper use of DME         AM-PAC PT "6 Clicks" Mobility  Outcome Measure Help needed turning from your back to your side while in a flat bed without using bedrails?: A Lot Help needed moving from lying on your back to sitting on the side of a flat bed without using bedrails?: A Lot Help needed moving to and from a bed to a chair (including a wheelchair)?: A Lot Help needed standing up from a chair using your arms (e.g., wheelchair or bedside chair)?: A Lot Help needed to walk in hospital room?: Total Help needed climbing 3-5 steps with a railing? : Total 6 Click Score: 10    End of Session Equipment Utilized During Treatment: Gait belt Activity Tolerance: Patient tolerated treatment well;Patient limited by fatigue Patient left: in chair;with call bell/phone within reach;with chair alarm set;with family/visitor present Nurse Communication: Mobility status PT Visit Diagnosis: Other abnormalities of gait and mobility (R26.89);Muscle weakness (generalized) (M62.81)    Time: 4098-1191 PT Time Calculation (min) (ACUTE ONLY): 27 min   Charges:   PT Evaluation $PT Eval Moderate Complexity: 1 Mod   PT General Charges $$ ACUTE  PT VISIT: 1 Visit       Ina Homes, PT, DPT Acute Rehabilitation Services  Personal: Secure Chat Rehab Office: [phone]  Malachy Chamber 10/03/2023, 12:16 PM

## 2023-10-03 NOTE — Progress Notes (Addendum)
Orthostatic BPs Supine 95/53, HR 72  Sitting 92/49, HR 75  Standing 100/79, HR 76  Standing after -- min *pt unable to maintain prolonged static standing  Post-transfer to recliner 97/51, HR 86

## 2023-10-03 NOTE — Progress Notes (Signed)
PROGRESS NOTE    Dakota [name]  ZOX:[id number] DOB: 1926/06/01 DOA: 10/02/2023 PCP: [name], MD    Brief Narrative:  89 year old with history of CAD, HTN, COPD, DM 2, CHF, A-fib on Eliquis, complete heart block status post pacemaker comes to the hospital complains of fatigue and diarrhea.  Recent history of complete heart block about a month ago had a pacemaker placed, postop course complicated by hematoma treated conservatively and with Keflex.  Now is back on Eliquis.  CT scan negative for any colitis possible gastritis   Assessment & Plan:  Principal Problem:   Lactic acidosis Active Problems:   COPD, moderate (HCC)   Diabetes (HCC)   Hypercholesterolemia   Chronic systolic CHF (congestive heart failure) (HCC)   Debility   CAD (coronary artery disease) of bypass graft   Essential hypertension   Atrial fibrillation with slow ventricular response (HCC)   Diarrhea   Dehydration   Hyponatremia   Normocytic anemia    Diarrhea/fatigue Dehydration/hyponatremia - CT abdomen/pelvis questionable for mild gastritis and cystitis.  Procalcitonin is negative.  UA negative, cultures pending -Continue PPI.  Holding off on home bowel regimen - Follow-up cultures.  Currently holding off on antibiotics  Hypocalcemia - Repletion  History of COPD -As needed bronchodilators  History of coronary artery disease status post CABG Congestive heart failure, EF 45% -Echocardiogram in August showed severe aortic stenosis Will discuss with his primary cardiology Dr. Kelly/[facility] cardiology tomorrow to see if we need to repeat limited echo Will slowly resume home cardiac meds.   Diabetes mellitus type 2 -On metformin at home.  Will place him on sliding scale and Accu-Cheks  Atrial fibrillation, chronic Symptomatic bradycardia with CHB status post pacemaker -Continue Eliquis  BPH -Supportive care  Hyperlipidemia -Statin  Chronic debility -Age-related.  Eventually we can consider  PT/OT  Anemia of chronic disease -Hemoglobin around baseline of 10.6  PT/OT  DVT prophylaxis: Eliquis Code Status: Full code Family Communication: Daughter at bedside Continue hospital stay due to significant weakness   Subjective: Seen at bedside, patient is hard of hearing but otherwise answers all the basic questions appropriately no other complaints Daughter is at bedside.  Previously they were not interested in SNF but they are at this time.   Examination:  General exam: Appears calm and comfortable, mildly frail and cachectic.  Hard of hearing Respiratory system: Clear to auscultation. Respiratory effort normal. Cardiovascular system: S1 & S2 heard, RRR. No JVD, murmurs, rubs, gallops or clicks. No pedal edema. Gastrointestinal system: Abdomen is nondistended, soft and nontender. No organomegaly or masses felt. Normal bowel sounds heard. Central nervous system: Alert and oriented. No focal neurological deficits. Extremities: Symmetric 4 x 5 power. Skin: No rashes, lesions or ulcers Psychiatry: Judgement and insight appear normal. Mood & affect appropriate.            Pressure Injury 10/03/23 Buttocks Bilateral;Right Stage 2 -  Partial thickness loss of dermis presenting as a shallow open injury with a red, pink wound bed without slough. (Active)  10/03/23 0200  Location: Buttocks  Location Orientation: Bilateral;Right  Staging: Stage 2 -  Partial thickness loss of dermis presenting as a shallow open injury with a red, pink wound bed without slough.  Wound Description (Comments):   Present on Admission: Yes     Pressure Injury 10/03/23 Heel Left Unstageable - Full thickness tissue loss in which the base of the injury is covered by slough (yellow, tan, gray, green or brown) and/or eschar (tan, brown or  black) in the wound bed. (Active)  10/03/23 0200  Location: Heel  Location Orientation: Left  Staging: Unstageable - Full thickness tissue loss in which the base  of the injury is covered by slough (yellow, tan, gray, green or brown) and/or eschar (tan, brown or black) in the wound bed.  Wound Description (Comments):   Present on Admission: Yes     Pressure Injury 10/03/23 Heel Right (Active)  10/03/23 0200  Location: Heel  Location Orientation: Right  Staging:   Wound Description (Comments):   Present on Admission:      Diet Orders (From admission, onward)     Start     Ordered   10/03/23 0043  Diet Carb Modified Fluid consistency: Thin; Room service appropriate? Yes  Diet effective now       Question Answer Comment  Diet-HS Snack? Nothing   Calorie Level Medium 1600-2000   Fluid consistency: Thin   Room service appropriate? Yes      10/03/23 0042            Objective: Vitals:   10/03/23 0130 10/03/23 0200 10/03/23 0305 10/03/23 0823  BP: (!) 161/78 113/62 (!) 113/55 112/61  Pulse: 67 72 69 69  Resp: 20 20 16 20   Temp: 97.9 F (36.6 C) 97.6 F (36.4 C) 97.7 F (36.5 C) 97.6 F (36.4 C)  TempSrc: Oral Oral Oral Oral  SpO2: 100% 99% 100% 100%  Weight:  40.1 kg    Height:  5\' 7"  (1.702 m)      Intake/Output Summary (Last 24 hours) at 10/03/2023 1155 Last data filed at 10/03/2023 0400 Gross per 24 hour  Intake 57.62 ml  Output --  Net 57.62 ml   Filed Weights   10/02/23 1231 10/03/23 0200  Weight: 45.6 kg 40.1 kg    Scheduled Meds:  apixaban  2.5 mg Oral BID   feeding supplement  237 mL Oral BID BM   [START ON 10/04/2023] ferrous sulfate  325 mg Oral Q breakfast   insulin aspart  0-9 Units Subcutaneous Q4H   ketorolac  1 drop Left Eye QID   mirabegron ER  50 mg Oral QHS   pantoprazole (PROTONIX) IV  40 mg Intravenous QHS   pravastatin  80 mg Oral QHS   prednisoLONE acetate  1 drop Left Eye QID   [START ON 10/04/2023] predniSONE  5 mg Oral Daily   Continuous Infusions:  Nutritional status     Body mass index is 13.85 kg/m.  Data Reviewed:   CBC: Recent Labs  Lab 10/02/23 1340 10/03/23 0044  10/03/23 0405  WBC 5.7  --  6.3  NEUTROABS 4.2  --   --   HGB 10.1* 9.9* 10.6*  HCT 32.4* 29.0* 33.6*  MCV 62.4*  --  61.3*  PLT 175  --  164   Basic Metabolic Panel: Recent Labs  Lab 10/02/23 1340 10/02/23 2352 10/02/23 2354 10/03/23 0044 10/03/23 0405 10/03/23 1034  NA 131*  --  134* 136 136 134*  K 3.8  --  3.8 3.7 3.7 3.5  CL 97*  --  101  --  101 99  CO2 23  --  24  --  26 25  GLUCOSE 182*  --  87  --  106* 163*  BUN 16  --  12  --  12 11  CREATININE 0.60*  --  0.59*  --  0.75 0.78  CALCIUM 8.6*  --  8.1*  --  8.4* 8.3*  MG  --  1.6*  --   --  2.3  --   PHOS  --  3.5  --   --  3.7  --    GFR: Estimated Creatinine Clearance: 31.3 mL/min (by C-G formula based on SCr of 0.78 mg/dL). Liver Function Tests: Recent Labs  Lab 10/02/23 1340 10/02/23 2354 10/03/23 0405  AST 25 23 21   ALT 16 15 16   ALKPHOS 49 45 47  BILITOT 0.9 0.9 1.2*  PROT 5.8* 5.4* 5.9*  ALBUMIN 3.2* 3.0* 3.2*   No results for input(s): "LIPASE", "AMYLASE" in the last 168 hours. No results for input(s): "AMMONIA" in the last 168 hours. Coagulation Profile: No results for input(s): "INR", "PROTIME" in the last 168 hours. Cardiac Enzymes: Recent Labs  Lab 10/02/23 2354  CKTOTAL 21*   BNP (last 3 results) No results for input(s): "PROBNP" in the last 8760 hours. HbA1C: Recent Labs    10/02/23 2330  HGBA1C 6.8*   CBG: Recent Labs  Lab 10/03/23 0037 10/03/23 0436 10/03/23 0819  GLUCAP 88 108* 83   Lipid Profile: No results for input(s): "CHOL", "HDL", "LDLCALC", "TRIG", "CHOLHDL", "LDLDIRECT" in the last 72 hours. Thyroid Function Tests: Recent Labs    10/02/23 1359  TSH 0.880   Anemia Panel: Recent Labs    10/02/23 1359 10/02/23 2354 10/03/23 0405  VITAMINB12  --   --  3,061*  FOLATE  --   --  30.4  FERRITIN  --  216  --   TIBC  --  225*  --   IRON  --  102  --   RETICCTPCT 1.0  --   --    Sepsis Labs: Recent Labs  Lab 10/02/23 1340 10/02/23 2354 10/03/23 0402   PROCALCITON  --  <0.10  --   LATICACIDVEN 2.5* 0.8 1.1    Recent Results (from the past 240 hour(s))  Culture, blood (routine x 2)     Status: None (Preliminary result)   Collection Time: 10/02/23 12:59 PM   Specimen: BLOOD RIGHT HAND  Result Value Ref Range Status   Specimen Description BLOOD RIGHT HAND  Final   Special Requests   Final    BOTTLES DRAWN AEROBIC ONLY Blood Culture results may not be optimal due to an inadequate volume of blood received in culture bottles   Culture   Final    NO GROWTH < 24 HOURS Performed at [facility], [address], [place], [place] [address]    Report Status PENDING  Incomplete  Urine Culture     Status: None (Preliminary result)   Collection Time: 10/02/23  5:36 PM   Specimen: Urine, Clean Catch  Result Value Ref Range Status   Specimen Description URINE, CLEAN CATCH  Final   Special Requests NONE  Final   Culture   Final    CULTURE REINCUBATED FOR BETTER GROWTH Performed at [facility], [address], [place], [place] [address]    Report Status PENDING  Incomplete         Radiology Studies: CT ABDOMEN PELVIS W CONTRAST  Result Date: 10/02/2023 CLINICAL DATA:  Lethargy.  History of UTI. EXAM: CT ABDOMEN AND PELVIS WITH CONTRAST TECHNIQUE: Multidetector CT imaging of the abdomen and pelvis was performed using the standard protocol following bolus administration of intravenous contrast. RADIATION DOSE REDUCTION: This exam was performed according to the departmental dose-optimization program which includes automated exposure control, adjustment of the mA and/or kV according to patient size and/or use of iterative reconstruction technique. CONTRAST:  75mL OMNIPAQUE IOHEXOL 350 MG/ML SOLN COMPARISON:  05/10/2023. FINDINGS: Lower chest: No acute abnormality. Hepatobiliary: No focal liver abnormality is seen. No gallstones, gallbladder wall thickening, or biliary dilatation. Pancreas: Unremarkable. No pancreatic ductal dilatation  or surrounding inflammatory changes. Spleen: Normal in size without focal abnormality. Adrenals/Urinary Tract: No adrenal mass. Kidneys normal in overall size, orientation and position with symmetric enhancement and excretion. Bilateral stable renal cysts measuring up to 1.8 cm. No follow-up indicated. No stones. Mild prominence of both intrarenal collecting systems and proximal ureters. No ureteral stone. Ureters not well-defined, but grossly normal in course and in caliber. Mild prominence of the bladder wall without overt thickening. No bladder mass or stone. Stomach/Bowel: Stomach is decompressed. There is low attenuation infiltrating the fat surrounding the stomach extending to the adjacent transverse colon. No convincing stomach wall thickening. Small bowel and colon are normal in caliber. No wall thickening or convincing inflammation. Vascular/Lymphatic: Aortic atherosclerosis. No aneurysm. No enlarged lymph nodes. Reproductive: Heterogeneous prostate, mildly enlarged. Other: No abdominal wall hernia or abnormality. No abdominopelvic ascites. Musculoskeletal: No fracture or acute finding. Skeletal structures are demineralized. There is a mottled type appearance of the skeleton with small ill-defined lucencies that are felt to be due to the diffuse bony demineralization. No defined bone lesion and no change. IMPRESSION: 1. There is hazy low-attenuation fat surrounds the stomach mass suggests inflammation/edema, but there is no evidence of stomach wall thickening. This still may reflect gastritis, but is nonspecific. 2. Mild prominence of the intrarenal collecting systems and proximal ureters. Bladder wall appears prominent but not overly thickened. Consider cystitis in the proper clinical setting. No CT evidence of pyelonephritis. 3. No other evidence of an acute abnormality within the abdomen or pelvis. 4. Aortic atherosclerosis. Aortic Atherosclerosis (ICD10-I70.0). Electronically Signed   By: Amie Portland  M.D.   On: 10/02/2023 16:59   DG Chest Portable 1 View  Result Date: 10/02/2023 CLINICAL DATA:  Altered mental status. EXAM: PORTABLE CHEST 1 VIEW COMPARISON:  09/01/2023 FINDINGS: There is a left chest wall single lead pacer device with lead terminating in the right ventricle. Status post median sternotomy CABG procedure. Stable cardiomediastinal contours. Blunting of the left costophrenic angle identified, unchanged from previous exam. Scar like densities noted in the left base. No signs of interstitial edema, airspace consolidation or pneumothorax. Bilateral glenohumeral joint osteoarthritis with marked narrowing of the acromial humeral interval bilaterally. IMPRESSION: 1. No acute cardiopulmonary abnormalities. 2. Chronic blunting of the left costophrenic angle. 3. Bilateral glenohumeral joint osteoarthritis with marked narrowing of the acromial humeral interval bilaterally. Electronically Signed   By: Signa Kell M.D.   On: 10/02/2023 13:34           LOS: 0 days   Time spent= 35 mins    Miguel Rota, MD [facility]  If 7PM-7AM, please contact night-coverage  10/03/2023, 11:55 AM

## 2023-10-03 NOTE — Plan of Care (Signed)
  Problem: Coping: Goal: Ability to adjust to condition or change in health will improve Outcome: Progressing   Problem: Skin Integrity: Goal: Risk for impaired skin integrity will decrease Outcome: Progressing Turn, reposition, prevalon boots to bilateral heels

## 2023-10-03 NOTE — Hospital Course (Addendum)
Brief Narrative:  89 year old with history of CAD, HTN, COPD, DM 2, CHF, A-fib on Eliquis, complete heart block status post pacemaker comes to the hospital complains of fatigue and diarrhea.  Recent history of complete heart block about a month ago had a pacemaker placed, postop course complicated by hematoma treated conservatively and with Keflex.  Now is back on Eliquis.  CT scan negative for any colitis possible gastritis.  Holding off on any antibiotics at this time, overall doing better but has very poor referred and congested.  Poor oral intake as well.  PT/OT recommending SNF, daughter is interested.   Assessment & Plan:  Principal Problem:   Lactic acidosis Active Problems:   COPD, moderate (HCC)   Diabetes (HCC)   Hypercholesterolemia   Chronic systolic CHF (congestive heart failure) (HCC)   Debility   CAD (coronary artery disease) of bypass graft   Essential hypertension   Atrial fibrillation with slow ventricular response (HCC)   Diarrhea   Dehydration   Hyponatremia   Normocytic anemia    Diarrhea/fatigue Dehydration/hyponatremia - CT abdomen/pelvis questionable for mild gastritis and cystitis.  Procalcitonin is negative.  UA negative, but small colony of Klebsiella.  Overall appears to be doing better.  Continue PPI.  No evidence of leukocytosis, hold off on antibiotics.  Cough/congestion - Possible some aspiration.  Bronchodilators, I-S/flutter valve.  Scheduled bronchodilators  Hypocalcemia - Repletion  History of COPD -As needed bronchodilators  History of coronary artery disease status post CABG Congestive heart failure, EF 45% -Echocardiogram in August showed severe aortic stenosis but reviewed by cardiology during this admission.  Apparently this was an overread and overall nothing further to do at this time.  Cardiology signed off.  Appreciate their input.  Diabetes mellitus type 2 -On metformin at home.  sliding scale and Accu-Cheks  Atrial fibrillation,  chronic Symptomatic bradycardia with CHB status post pacemaker -Continue Eliquis  BPH -Supportive care  Hyperlipidemia -Statin  Chronic debility -Age-related.  Eventually we can consider PT/OT  Anemia of chronic disease -Hemoglobin around baseline of 10.6  PT/OT-SNF  DVT prophylaxis: Eliquis Code Status: Full code Family Communication: Daughter at bedside SNF placement pending   Subjective: Overall very weak Poor coughing effort and poor effort on incentive spirometer   Examination:  General exam: Appears calm and comfortable, mildly frail and cachectic.  Hard of hearing Respiratory system: Bilateral rhonchi Cardiovascular system: S1 & S2 heard, RRR. No JVD, murmurs, rubs, gallops or clicks. No pedal edema. Gastrointestinal system: Abdomen is nondistended, soft and nontender. No organomegaly or masses felt. Normal bowel sounds heard. Central nervous system: Alert and oriented. No focal neurological deficits. Extremities: Symmetric 4 x 5 power. Skin: No rashes, lesions or ulcers Psychiatry: Judgement and insight appear normal. Mood & affect appropriate.

## 2023-10-03 NOTE — Progress Notes (Addendum)
  Patient's daughter refusing to give Eliquis as she thinks that it is too soon/early in the morning to give the patient. Per chart review patient has been admitted for management of dehydration, hyponatremia, lactic acidosis and diarrhea. -Patient's hemoglobin 10.6.  Per chart review patient has variable hemoglobin between 8-11. -As per family request adjusting the dose of Eliquis to give around 10 AM and continue 2.5 mg twice daily.  Tereasa Coop, MD [facility] 10/03/2023, 5:01 AM

## 2023-10-04 DIAGNOSIS — I5042 Chronic combined systolic (congestive) and diastolic (congestive) heart failure: Secondary | ICD-10-CM

## 2023-10-04 DIAGNOSIS — E43 Unspecified severe protein-calorie malnutrition: Secondary | ICD-10-CM | POA: Insufficient documentation

## 2023-10-04 DIAGNOSIS — E872 Acidosis, unspecified: Secondary | ICD-10-CM | POA: Diagnosis not present

## 2023-10-04 DIAGNOSIS — I358 Other nonrheumatic aortic valve disorders: Secondary | ICD-10-CM

## 2023-10-04 LAB — BASIC METABOLIC PANEL
Anion gap: 7 (ref 5–15)
BUN: 20 mg/dL (ref 8–23)
CO2: 27 mmol/L (ref 22–32)
Calcium: 8.4 mg/dL — ABNORMAL LOW (ref 8.9–10.3)
Chloride: 100 mmol/L (ref 98–111)
Creatinine, Ser: 0.85 mg/dL (ref 0.61–1.24)
GFR, Estimated: >60 mL/min (ref >60)
Glucose, Bld: 97 mg/dL (ref 70–99)
Potassium: 4.1 mmol/L (ref 3.5–5.1)
Sodium: 134 mmol/L — ABNORMAL LOW (ref 135–145)

## 2023-10-04 LAB — BRAIN NATRIURETIC PEPTIDE: B Natriuretic Peptide: 526.4 pg/mL — ABNORMAL HIGH (ref 0.0–100.0)

## 2023-10-04 LAB — CBC
HCT: 28.5 % — ABNORMAL LOW (ref 39.0–52.0)
Hemoglobin: 9 g/dL — ABNORMAL LOW (ref 13.0–17.0)
MCH: 19.2 pg — ABNORMAL LOW (ref 26.0–34.0)
MCHC: 31.6 g/dL (ref 30.0–36.0)
MCV: 60.9 fL — ABNORMAL LOW (ref 80.0–100.0)
Platelets: 161 10*3/uL (ref 150–400)
RBC: 4.68 MIL/uL (ref 4.22–5.81)
RDW: 15.5 % (ref 11.5–15.5)
WBC: 6.4 10*3/uL (ref 4.0–10.5)
nRBC: 0 % (ref 0.0–0.2)

## 2023-10-04 LAB — CALCIUM, IONIZED: Calcium, Ionized, Serum: 4.7 mg/dL (ref 4.5–5.6)

## 2023-10-04 LAB — GLUCOSE, CAPILLARY
Glucose-Capillary: 105 mg/dL — ABNORMAL HIGH (ref 70–99)
Glucose-Capillary: 133 mg/dL — ABNORMAL HIGH (ref 70–99)
Glucose-Capillary: 163 mg/dL — ABNORMAL HIGH (ref 70–99)
Glucose-Capillary: 201 mg/dL — ABNORMAL HIGH (ref 70–99)
Glucose-Capillary: 131 mg/dL — ABNORMAL HIGH (ref 70–99)
Glucose-Capillary: 116 mg/dL — ABNORMAL HIGH (ref 70–99)

## 2023-10-04 LAB — MAGNESIUM: Magnesium: 2 mg/dL (ref 1.7–2.4)

## 2023-10-04 LAB — PROCALCITONIN: Procalcitonin: <0.1 ng/mL

## 2023-10-04 MED ORDER — ADULT MULTIVITAMIN W/MINERALS CH
1.0000 | ORAL_TABLET | Freq: Every day | ORAL | Status: DC
  Administered 2023-10-04 – 2023-10-08 (×5): 1 via ORAL
  Filled 2023-10-04 (×5): qty 1

## 2023-10-04 MED ORDER — JUVEN PO PACK
1.0000 | PACK | Freq: Two times a day (BID) | ORAL | Status: DC
Start: 2023-10-04 — End: 2023-10-08
  Administered 2023-10-04 – 2023-10-08 (×7): 1 via ORAL
  Filled 2023-10-04 (×8): qty 1

## 2023-10-04 NOTE — NC FL2 (Signed)
[place] MEDICAID FL2 LEVEL OF CARE FORM     IDENTIFICATION  Patient Name: Dakota [name] Birthdate: 12-27-1925 Sex: male Admission Date (Current Location): 10/02/2023  [facility] and [place] Number:  Producer, television/film/video and Address:  [facility] [place], [address], [place], [place] [address]      Provider Number: [id number]  Attending Physician Name and Address:  Miguel Rota, MD  Relative Name and Phone Number:       Current Level of Care: Hospital Recommended Level of Care: Skilled Nursing Facility Prior Approval Number:    Date Approved/Denied:   PASRR Number: [id number]  Discharge Plan: SNF    Current Diagnoses: Patient Active Problem List   Diagnosis Date Noted   Protein-calorie malnutrition, severe 10/04/2023   Sepsis (HCC) 10/03/2023   Lactic acidosis 10/02/2023   Diarrhea 10/02/2023   Dehydration 10/02/2023   Hyponatremia 10/02/2023   Normocytic anemia 10/02/2023   Atrial fibrillation with slow ventricular response (HCC) 08/31/2023   Symptomatic bradycardia 08/29/2023   SBO (small bowel obstruction) (HCC) 05/10/2023   Inguinal hernia of right side with obstruction 05/10/2023   Cardiac arrhythmia 11/26/2019   Chest pain 04/10/2018   Nonspecific chest pain    Essential hypertension 09/07/2016   COPD, moderate (HCC) 06/13/2015   CAD (coronary artery disease) of bypass graft 05/23/2015   Coronary artery disease involving native coronary artery of native heart with angina pectoris (HCC)    Abnormal nuclear stress test    Ischemic chest pain (HCC)    Cough 09/26/2012   Debility 04/11/2012   Hx of CABG 04/03/2011   Left bundle branch block 04/03/2011   Diabetes (HCC) 04/03/2011   Hypercholesterolemia 04/03/2011   Chronic systolic CHF (congestive heart failure) (HCC) 04/03/2011    Orientation RESPIRATION BLADDER Height & Weight     Self, Time, Situation, Place  Normal External catheter, Incontinent Weight: 88 lb 6.5 oz (40.1  kg) Height:  5\' 7"  (170.2 cm)  BEHAVIORAL SYMPTOMS/MOOD NEUROLOGICAL BOWEL NUTRITION STATUS      Continent Diet (see discharge summary)  AMBULATORY STATUS COMMUNICATION OF NEEDS Skin   Limited Assist Verbally  (pressure injury buttocks, pressure injury right heel,pressure insury left heel unstageable)                       Personal Care Assistance Level of Assistance  Bathing, Feeding, Dressing Bathing Assistance: Limited assistance Feeding assistance: Limited assistance Dressing Assistance: Limited assistance     Functional Limitations Info  Sight, Hearing, Speech Sight Info: Adequate (glasses) Hearing Info: Impaired Speech Info: Adequate    SPECIAL CARE FACTORS FREQUENCY  PT (By licensed PT), OT (By licensed OT)     PT Frequency: 5x per wek OT Frequency: 5x per week            Contractures Contractures Info: Not present    Additional Factors Info  Code Status, Allergies Code Status Info: Full Allergies Info: Aceon ,Sulfa Antibiotics,Pork-derived Products           Current Medications (10/04/2023):  This is the current hospital active medication list Current Facility-Administered Medications  Medication Dose Route Frequency Provider Last Rate Last Admin   acetaminophen (TYLENOL) tablet 650 mg  650 mg Oral Q6H PRN Doutova, Anastassia, MD   650 mg at 10/03/23 1356   Or   acetaminophen (TYLENOL) suppository 650 mg  650 mg Rectal Q6H PRN Doutova, Anastassia, MD       apixaban (ELIQUIS) tablet 2.5 mg  2.5  mg Oral BID Janalyn Shy, Subrina, MD   2.5 mg at 10/04/23 0855   feeding supplement (ENSURE ENLIVE / ENSURE PLUS) liquid 237 mL  237 mL Oral BID BM Doutova, Anastassia, MD   237 mL at 10/04/23 1459   ferrous sulfate tablet 325 mg  325 mg Oral Q breakfast Amin, Ankit C, MD   325 mg at 10/04/23 0855   hydrALAZINE (APRESOLINE) injection 10 mg  10 mg Intravenous Q4H PRN Amin, Ankit C, MD       HYDROcodone-acetaminophen (NORCO/VICODIN) 5-325 MG per tablet 1-2 tablet  1-2  tablet Oral Q4H PRN Doutova, Anastassia, MD       insulin aspart (novoLOG) injection 0-9 Units  0-9 Units Subcutaneous Q4H Doutova, Anastassia, MD   2 Units at 10/04/23 1459   ipratropium-albuterol (DUONEB) 0.5-2.5 (3) MG/3ML nebulizer solution 3 mL  3 mL Nebulization Q4H PRN Amin, Ankit C, MD   3 mL at 10/03/23 0955   ketorolac (ACULAR) 0.5 % ophthalmic solution 1 drop  1 drop Left Eye QID Therisa Doyne, MD   1 drop at 10/04/23 1500   metoprolol tartrate (LOPRESSOR) injection 5 mg  5 mg Intravenous Q4H PRN Amin, Ankit C, MD       mirabegron ER (MYRBETRIQ) tablet 50 mg  50 mg Oral QHS Doutova, Anastassia, MD   50 mg at 10/03/23 2255   multivitamin with minerals tablet 1 tablet  1 tablet Oral Daily Amin, Ankit C, MD   1 tablet at 10/04/23 1459   nutrition supplement (JUVEN) (JUVEN) powder packet 1 packet  1 packet Oral BID BM Amin, Ankit C, MD   1 packet at 10/04/23 1459   ondansetron (ZOFRAN) tablet 4 mg  4 mg Oral Q6H PRN Therisa Doyne, MD       Or   ondansetron (ZOFRAN) injection 4 mg  4 mg Intravenous Q6H PRN Doutova, Anastassia, MD       pantoprazole (PROTONIX) injection 40 mg  40 mg Intravenous QHS Doutova, Anastassia, MD   40 mg at 10/03/23 2255   pravastatin (PRAVACHOL) tablet 80 mg  80 mg Oral QHS Doutova, Anastassia, MD   80 mg at 10/03/23 2255   prednisoLONE acetate (PRED FORTE) 1 % ophthalmic suspension 1 drop  1 drop Left Eye QID Doutova, Anastassia, MD   1 drop at 10/04/23 1500   predniSONE (DELTASONE) tablet 5 mg  5 mg Oral Daily Amin, Ankit C, MD   5 mg at 10/04/23 0855   senna-docusate (Senokot-S) tablet 1 tablet  1 tablet Oral QHS PRN Miguel Rota, MD         Discharge Medications: Please see discharge summary for a list of discharge medications.  Relevant Imaging Results:  Relevant Lab Results:   Additional Information SSN [id number]  Eduard Roux, LCSW

## 2023-10-04 NOTE — Consult Note (Addendum)
Cardiology Consultation   Patient ID: HOAN ROUGH MRN: [id number]; DOB: Nov 10, 1925  Admit date: 10/02/2023 Date of Consult: 10/04/2023  PCP:  [name], MD   [facility] Providers Cardiologist:  Nicki Guadalajara, MD   {    Patient Profile:   Dakota [name] is a 89 y.o. male with a hx of permanent atrial fibrillation, chronic combined heart failure, LBBB, complete heart block s/p PPM, CAD s/p CABG 2005  (with LIMA to LAD, SVG to diagonal, SVG to OM and SVG to distal RCA) and s/p DES to OM vein graft 2016, hypertension, COPD, type 2 diabetes, BPH, HLD, chronic anemia, possible TIA 2020, who is being seen 10/04/2023 for the evaluation of CHF and aortic stenosis at the request of Dr. Nelson Chimes.  History of Present Illness:   Mr. Musto was above past medical history who presented to the ER for lethargy, fatigue and diarrhea. He has been very weak and was not able to ambulate independently. He is quite sleepy on exam, minimally verbal when asked questions, denied chest pain or SOB. His daughter is the primary caregiver, is at bedside to assist history. Daughter states patient has been very weak with minimal activity at home since July 2024 hernia repair. He was constipated, PCP ordered stool softener, whenever he takes stool softener he would have diarrhea, diarrhea would stop if he stops taking the stool softener. He has not been eating and drinking well. He is quite weak and has not been able to walk. He had no complaints of chest pain, SOB, dizziness, syncope at home. Daughter felt patient became more awake and energetic since ER stay here. Daughter thought patient had UTI due to urine odor and dark color initially. He denied any issue with pacemaker.   He was recently hospitalized here from 08/29/2023 to 09/02/2023 for symptomatic bradycardia/A-fib with slow ventricular response 2/2 complete heart block. He required IV dopamine, and underwent successful [employer] single-chamber pacemaker  implantation on 08/31/2023 by Dr. Ladona Ridgel.  He was discharged on 09/02/2023, noted debility, PT OT had recommended home health PT.  He followed up in the office on 09/15/2023 for wound check, Steri-Strips was removed, area of concern was assessed by Dr. Lalla Brothers and he was advised to return for wound recheck in a week.  He returned to the office for wound check on 09/22/2023, there was a concern for hematoma at pacemaker site, Dr. Ladona Ridgel had recommended him to hold Eliquis for 7 days and started 7 days of Keflex.  He returned to the office 09/29/23, hematoma had improved, he was evaluated by Dr. Graciela Husbands and advised to resume Eliquis.   Admission diagnostic from 10/02/2023 revealed hyponatremia 131, glucose 182, creatinine 0.6, albumin 3.2.  Lactic acid 2.5.  Hemoglobin 10.1.  TSH WNL.  Urinalysis grossly unremarkable.  Urine culture > 10,000 Klebsiella Ornithinolytica.  Blood culture no growth so far. CTAP 10/02/23 showed ED low-attenuation fat surrounds the stomach mass suggest inflammation/edema, may reflect gastritis; mild prominence of intrarenal collecting system and proximal ureters, bladder wall appears prominent, consider cystitis; no other acute abnormality of abdomen or pelvis.  He was admitted to hospital medicine for acute diarrhea and fatigue and dehydration.  He was given some IV fluids and electrolyte replacement.  Lactic acidosis has resolved. PT/OT evaluation had recommended SNF.  Cardiology is consulted today due to aortic stenosis per echocardiogram report in July 2024. Lab today showed BNP 526 . Procal neagtive. Unremarkable BMP. Hgb 9.     Per further chart review,  he follows Dr. Tresa Endo, has CAD and underwent CABG 2005 with LIMA to the LAD, SVG to the diagonal, SVG to the obtuse marginal, and SVG to his distal RCA.  Most recent cardiac catheterization on 05/21/2015 was done due to chest pain and abnormal stress myoview, which revealed low normal global LV function with an EF of 50-55%. There was  significant native CAD with 95% distal left main stenosis, proximal occlusion of the LAD after the first 2 septal perforating arteries, total occlusion of the circumflex at the ostium, and RCA stenoses, 50% proximally, 50% mid, and total occlusion of the distal RCA after a small PDA-like vessel. He had a patent LIMA graft supplying the mid LAD. The vein graft supplying the circumflex marginal had a 99% distal anastomosis stenosis. He had a patent vein graft supplying the diagonal vessel and a pain vein graft supplying the distal RCA with some collateralization of the AV groove circumflex from the PLA vessel. A DES x1 to the obtuse marginal vein graft was placed at that time. This procedure was complicated by a small pseudoaneurysm which essentially self thrombosed and he did not require any thrombin injection.   He has chronic combined CHF. Echo from 2018 showed LVEF 50-55%, grade 2 DD, moderate aortic calcification, moderate LAE, mild RAE.  Echo from 2019 revealed LVEF 60 to 65%, grade 1 DD, aortic valve mobility was restricted, transvalvular velocity was within the normal range, no stenosis, mild AI, trivial MR, normal RV, severe LAE, mild RAE.  Echocardiogram from 2020 revealed LVEF 50 to 55%, impaired relaxation pattern of LV diastolic filling, normal RV, mild LAE, normal RA, moderate MAC, trace MR, AI was not visualized, aortic valve heavily calcified and leaflet mobility appears to be reduced but there was not significant AV gradient, mild elevated PASP. Echocardiogram from 04/14/2021 showed LVEF 35 to 40%, global hypokinesis, inability to evaluate diastolic function, moderately reduced RV, RV mildly enlarged, mildly elevated PASP, severe LAE, moderate RAE, mild MR, severe MAC, moderate TR, mild to moderate aortic sclerosis/calcification, no AS. He was managed on lasix,, lsartan, imdur based on office note on 03/05/23 visit.   He was admitted for July 2024 for inguinal hernia repair. Cardiology was consulted  for pre-op risk evaluation. Echocardiogram from 05/11/23 with LVEF 40 to 45%, global hypokinesis, concentric LVH, significant dyssynchrony due to RV DD, normal RV aortic valve appears severely stenotic by visualization with mean gradient , mild mitral regurg moderate to severe MAC. He was felt stable from cardiology standpoint. He had post op A fib RVR, metoprolol 25mg  BID was added for rate control. He was continued on Eliquis 2.5mg  BID for permanent A fib. He followed up on 07/05/23 at the office, doing well from CHF standpoint , advised to continue Lasix every 4th day. A fib was controlled with HR at 100s.    Past Medical History:  Diagnosis Date   Aortic atherosclerosis (HCC)    Arthritis    Atrial fibrillation, chronic (HCC)    Benign localized prostatic hyperplasia with lower urinary tract symptoms (LUTS)    CAD (coronary artery disease)    Chronic chest pain    takes imdur   Chronic combined systolic (congestive) and diastolic (congestive) heart failure (HCC)    followed by cardiology   Chronic dyspnea    Cognitive impairment    COPD, moderate (HCC)    Edema of both lower extremities    takes lasix and uses teds   First degree heart block    GERD (gastroesophageal reflux  disease)    Heart murmur    History of cardiovascular stress test    Lexiscan Myoview 7/16:  EF 51%, inferior, inferolateral ischemia; Intermediate Risk   History of smallpox    HOH (hard of hearing)    even with hearing aids   Hypertension    Ischemic heart disease 2005   Dr Tresa Endo a. s/p CABG;  b.  LHC 05/23/15:  S-RPAVB ok, S-D2 ok, S-OM2 99% (s/p Synergy DES), L-LAD ok, EF normal (complicated by pseudoaneurysm)    LBBB (left bundle branch block)    Right arm weakness    S/P CABG x 4    01-08-2004   S/P drug eluting coronary stent placement    05-21-2015  PCI and DES x1 to SVG--OM graft   Thalassemia minor    Type 2 diabetes, diet controlled (HCC)    Wears glasses    Wears hearing aid in both ears      Past Surgical History:  Procedure Laterality Date   APPENDECTOMY     CARDIAC CATHETERIZATION  1989   in [place]   normal     CARDIAC CATHETERIZATION N/A 05/23/2015   Procedure: Left Heart Cath and Cors/Grafts Angiography;  Surgeon: Lennette Bihari, MD;  Location: [facility];  Service: Cardiovascular;  Laterality: N/A;   CARDIAC CATHETERIZATION N/A 05/23/2015   Procedure: Coronary Stent Intervention;  Surgeon: Lennette Bihari, MD;  Location: [facility];  Service: Cardiovascular;  Laterality: N/A;   CARDIAC CATHETERIZATION  01-04-2004   dr Melburn Popper  @[facility]    severe 3V CAD, normal LVF   CATARACT EXTRACTION W/ INTRAOCULAR LENS  IMPLANT, BILATERAL     CORONARY ARTERY BYPASS GRAFT  01-08-2004  dr hendrickson @[facility]    LIMA to LAD,  SVG to D1,  SVG to OM,  SVG to PDA   FOOT SURGERY Right    INGUINAL HERNIA REPAIR Bilateral 1978   INGUINAL HERNIA REPAIR Bilateral 05/12/2023   Procedure: HERNIA REPAIR INGUINAL ADULT;  Surgeon: Griselda Miner, MD;  Location: [facility] OR;  Service: General;  Laterality: Bilateral;   INSERTION OF MESH Bilateral 05/12/2023   Procedure: INSERTION OF MESH;  Surgeon: Griselda Miner, MD;  Location: [facility] OR;  Service: General;  Laterality: Bilateral;   MASS EXCISION Left 11/20/2019   Procedure: EXCISION OF SEBACEOUS CYST CHEST;  Surgeon: Sheliah Hatch De Blanch, MD;  Location: [facility];  Service: General;  Laterality: Left;   PACEMAKER IMPLANT N/A 08/31/2023   Procedure: PACEMAKER IMPLANT;  Surgeon: Marinus Maw, MD;  Location: [facility];  Service: Cardiovascular;  Laterality: N/A;     Home Medications:  Prior to Admission medications   Medication Sig Start Date End Date Taking? Authorizing Provider  acetaminophen (TYLENOL) 500 MG tablet Take 1 tablet (500 mg total) by mouth every 8 (eight) hours as needed for moderate pain. 05/17/23 05/16/24 Yes Leroy Sea, MD  albuterol (PROVENTIL HFA;VENTOLIN HFA) 108 (90 Base) MCG/ACT inhaler Inhale  2 puffs into the lungs every 4 (four) hours as needed for wheezing or shortness of breath. 01/03/16  Yes Charm Rings, MD  apixaban (ELIQUIS) 2.5 MG TABS tablet Take 1 tablet (2.5 mg total) by mouth 2 (two) times daily. 09/17/23  Yes Lennette Bihari, MD  docusate sodium (COLACE) 100 MG capsule Take 1 capsule (100 mg total) by mouth 2 (two) times daily Patient taking differently: Take 100 mg by mouth at bedtime. 05/17/23  Yes Leroy Sea, MD  finasteride [facility])  5 MG tablet Take 5 mg by mouth at bedtime.    Yes [provider]  isosorbide mononitrate (IMDUR) 30 MG 24 hr tablet Take 1 tablet by mouth once daily 06/07/23  Yes Cleaver, Thomasene Ripple, NP  ketorolac (ACULAR) 0.5 % ophthalmic solution Place 1 drop into the left eye 4 (four) times daily.   Yes [provider]  metFORMIN (GLUCOPHAGE) 500 MG tablet Take 500 mg by mouth 2 (two) times daily.   Yes [provider]  mirabegron ER (MYRBETRIQ) 50 MG TB24 tablet Take 50 mg by mouth at bedtime.    Yes [provider]  Multiple Vitamin (MULTIVITAMIN WITH MINERALS) TABS tablet Take 1 tablet by mouth daily.   Yes [provider]  Multiple Vitamins-Minerals (PRESERVISION AREDS 2 PO) Take 1 tablet by mouth 2 (two) times daily.    Yes [provider]  NITROSTAT 0.4 MG SL tablet DISSOLVE ONE TABLET UNDER THE TONGUE EVERY 5 MINUTES AS NEEDED FOR CHEST PAIN. Patient taking differently: Place 0.4 mg under the tongue every 5 (five) minutes as needed for chest pain. 01/13/22  Yes Lennette Bihari, MD  pantoprazole (PROTONIX) 40 MG tablet Take 1 tablet (40 mg total) by mouth daily. Patient taking differently: Take 40 mg by mouth daily. 05/21/15  Yes Weaver, Scott T, PA-C  pravastatin (PRAVACHOL) 80 MG tablet Take 80 mg by mouth at bedtime.   Yes [provider]  prednisoLONE acetate (PRED FORTE) 1 % ophthalmic suspension Place 1 drop into the left eye 4 (four) times daily.   Yes [provider]   predniSONE (DELTASONE) 5 MG tablet Take 5 tablets by mouth daily.   Yes [provider]  Blood Glucose Monitoring Suppl (ONE TOUCH ULTRA 2) w/Device KIT daily in the afternoon. 11/02/22   [provider]  furosemide (LASIX) 20 MG tablet TAKE 1 TABLET BY MOUTH EVERY OTHER DAY 10/04/23   Ronney Asters, NP  losartan (COZAAR) 25 MG tablet Take 1 tablet by mouth once daily 10/04/23   Ronney Asters, NP  ONE TOUCH ULTRA TEST test strip 1 each by Other route as needed (glucose monoring).  04/21/15   [provider]    Inpatient Medications: Scheduled Meds:  apixaban  2.5 mg Oral BID   feeding supplement  237 mL Oral BID BM   ferrous sulfate  325 mg Oral Q breakfast   insulin aspart  0-9 Units Subcutaneous Q4H   ketorolac  1 drop Left Eye QID   mirabegron ER  50 mg Oral QHS   multivitamin with minerals  1 tablet Oral Daily   nutrition supplement (JUVEN)  1 packet Oral BID BM   pantoprazole (PROTONIX) IV  40 mg Intravenous QHS   pravastatin  80 mg Oral QHS   prednisoLONE acetate  1 drop Left Eye QID   predniSONE  5 mg Oral Daily   Continuous Infusions:  PRN Meds: acetaminophen **OR** acetaminophen, hydrALAZINE, HYDROcodone-acetaminophen, ipratropium-albuterol, metoprolol tartrate, ondansetron **OR** ondansetron (ZOFRAN) IV, senna-docusate  Allergies:    Allergies  Allergen Reactions   Aceon [Perindopril Erbumine] Shortness Of Breath and Cough   Sulfa Antibiotics Swelling   Pork-Derived Products     Social History:   Social History   Socioeconomic History   Marital status: Married    Spouse name: Not on file   Number of children: 2 d   Years of education: Not on file   Highest education level: Not on file  Occupational History   Occupation: Airline pilot  Comment: [employer]  Tobacco Use   Smoking status: Never   Smokeless tobacco: Never  Vaping Use   Vaping status: Never Used  Substance and Sexual Activity   Alcohol use: No    Alcohol/week: 0.0  standard drinks of alcohol   Drug use: No   Sexual activity: Not on file  Other Topics Concern   Not on file  Social History Narrative   Not on file   Social Determinants of Health   Financial Resource Strain: Not on file  Food Insecurity: No Food Insecurity (10/03/2023)   Hunger Vital Sign    Worried About Running Out of Food in the Last Year: Never true    Ran Out of Food in the Last Year: Never true  Transportation Needs: No Transportation Needs (10/03/2023)   PRAPARE - Administrator, Civil Service (Medical): No    Lack of Transportation (Non-Medical): No  Physical Activity: Not on file  Stress: Not on file  Social Connections: Not on file  Intimate Partner Violence: Patient Unable To Answer (10/03/2023)   Humiliation, Afraid, Rape, and Kick questionnaire    Fear of Current or Ex-Partner: Patient unable to answer    Emotionally Abused: Patient unable to answer    Physically Abused: Patient unable to answer    Sexually Abused: Patient unable to answer    Family History:    Family History  Problem Relation Age of Onset   Healthy Father    Ulcers Mother    Stroke Mother      ROS:  Constitutional: Denied fever, chills, malaise, night sweats Eyes: Denied vision change or loss Ears/Nose/Mouth/Throat: Denied ear ache, sore throat, coughing, sinus pain Cardiovascular: see HPI  Respiratory: see HPI  Gastrointestinal: see HPI  Genital/Urinary: see HPI  Musculoskeletal: see HPI  Skin: Denied rash, wound Neuro: Denied headache, dizziness, syncope Psych: history of depression/anxiety  Endocrine: history of diabetes   Physical Exam/Data:   Vitals:   10/03/23 2308 10/04/23 0355 10/04/23 0752 10/04/23 1135  BP: (!) 104/55 (!) 100/55 (!) 110/59 114/66  Pulse: 78 70 77 70  Resp: 19 19 17 18   Temp: 97.6 F (36.4 C) 98 F (36.7 C) (!) 97.5 F (36.4 C)   TempSrc: Oral Oral Oral Oral  SpO2: 100% 100% 99% 99%  Weight:      Height:        Intake/Output  Summary (Last 24 hours) at 10/04/2023 1231 Last data filed at 10/04/2023 0854 Gross per 24 hour  Intake 680 ml  Output 650 ml  Net 30 ml      10/03/2023    2:00 AM 10/02/2023   12:31 PM 09/02/2023    6:10 AM  Last 3 Weights  Weight (lbs) 88 lb 6.5 oz 100 lb 8.5 oz 100 lb 8.5 oz  Weight (kg) 40.1 kg 45.6 kg 45.6 kg     Body mass index is 13.85 kg/m.   Vitals:  Vitals:   10/04/23 0752 10/04/23 1135  BP: (!) 110/59 114/66  Pulse: 77 70  Resp: 17 18  Temp: (!) 97.5 F (36.4 C)   SpO2: 99% 99%   General Appearance: In no apparent distress, laying in bed, frail elderly, weak, cachetic  HEENT: Normocephalic, atraumatic.  Neck: Supple, trachea midline, no JVDs Cardiovascular: Irregularly irregular, normal S1-S2, grade II systolic murmur LUSB Respiratory: Resting breathing unlabored, lungs sounds clear to auscultation bilaterally, no use of accessory muscles. On room air.   Gastrointestinal: Bowel sounds positive, abdomen soft, non-tender, non-distended.  Extremities:  Able to move all extremities in bed without difficulty, no leg edema  Musculoskeletal: Normal muscle bulk and tone Skin: Intact, warm, dry.  Neurologic: Alert, oriented to person, place and time. Fluent speech, no facial droop, no cognitive deficit, no pronator drift, no gross sensory deficit, no focal muscle weakness, no gross focal neuro deficit Psychiatric: Normal affect. Mood is appropriate.  External device: Foley/CVL/Port/Dialysis catheter       EKG:  The EKG was personally reviewed and demonstrates:    EKG from 10/02/23 showed V paced rhythm 70bpm   Telemetry:  Telemetry was personally reviewed and demonstrates:    Intermittently V paced rhythm   Relevant CV Studies:   Echo from 05/11/23:  1. Technically difficult study.   2. Left ventricular ejection fraction, by estimation, is 40 to 45%. The  left ventricle has mildly decreased function. The left ventricle  demonstrates global hypokinesis. There  is mild concentric left ventricular  hypertrophy. Left ventricular diastolic  function could not be evaluated. Significant dyssynchrony due to LBBB.   3. Right ventricular systolic function is normal. The right ventricular  size is normal.   4. There is severe calcifcation of the aortic valve. There is severe  thickening of the aortic valve. Aortic valve regurgitation is not  visualized. The aortic valve appears severly stenotic by visualization.  There was limited/incomplete doppler  evaluation for aortic stenosis with a mean gradient of .   5. The mitral valve is degenerative. Mild mitral valve regurgitation.  Moderate to severe mitral annular calcification.    Echo from 04/14/21:  1. Left ventricular ejection fraction, by estimation, is 35 to 40%. The  left ventricle has moderately decreased function. The left ventricle  demonstrates global hypokinesis. Left ventricular diastolic function could  not be evaluated.   2. Right ventricular systolic function is moderately reduced. The right  ventricular size is mildly enlarged. There is mildly elevated pulmonary  artery systolic pressure.   3. Left atrial size was severely dilated.   4. Right atrial size was moderately dilated.   5. The mitral valve is abnormal. Mild mitral valve regurgitation. Severe  mitral annular calcification.   6. Tricuspid valve regurgitation is moderate.   7. The aortic valve is calcified. There is severe calcifcation of the  aortic valve. Aortic valve regurgitation is not visualized. Mild to  moderate aortic valve sclerosis/calcification is present, without any  evidence of aortic stenosis.   8. The inferior vena cava is normal in size with <50% respiratory  variability, suggesting right atrial pressure of 8 mmHg.   Comparison(s): Prior images reviewed side by side. Changes from prior  study are noted. EF reduced compared to prior study.   Conclusion(s)/Recommendation(s): EF reduced on current study  compared to  prior, with significant dyssynchrony. Both mitral and aortic valves are  heavily calcified with restricted mobility. However, neither has  significantly elevated gradients.    Laboratory Data:  High Sensitivity Troponin:  No results for input(s): "TROPONINIHS" in the last 720 hours.   Chemistry Recent Labs  Lab 10/02/23 2352 10/02/23 2354 10/03/23 0405 10/03/23 1034 10/03/23 1620 10/03/23 2232 10/04/23 0314  NA  --    < > 136   < > 133* 132* 134*  K  --    < > 3.7   < > 4.5 4.3 4.1  CL  --    < > 101   < > 100 99 100  CO2  --    < > 26   < > 22  26 27  GLUCOSE  --    < > 106*   < > 325* 162* 97  BUN  --    < > 12   < > 18 22 20   CREATININE  --    < > 0.75   < > 1.12 1.00 0.85  CALCIUM  --    < > 8.4*   < > 8.8* 8.6* 8.4*  MG 1.6*  --  2.3  --   --   --  2.0  GFRNONAA  --    < > >60   < > >60 >60 >60  ANIONGAP  --    < > 9   < > 11 7 7    < > = values in this interval not displayed.    Recent Labs  Lab 10/02/23 1340 10/02/23 2354 10/03/23 0405  PROT 5.8* 5.4* 5.9*  ALBUMIN 3.2* 3.0* 3.2*  AST 25 23 21   ALT 16 15 16   ALKPHOS 49 45 47  BILITOT 0.9 0.9 1.2*   Lipids No results for input(s): "CHOL", "TRIG", "HDL", "LABVLDL", "LDLCALC", "CHOLHDL" in the last 168 hours.  Hematology Recent Labs  Lab 10/02/23 1340 10/02/23 1359 10/03/23 0044 10/03/23 0405 10/04/23 0314  WBC 5.7  --   --  6.3 6.4  RBC 5.19 5.18  --  5.48 4.68  HGB 10.1*  --  9.9* 10.6* 9.0*  HCT 32.4*  --  29.0* 33.6* 28.5*  MCV 62.4*  --   --  61.3* 60.9*  MCH 19.5*  --   --  19.3* 19.2*  MCHC 31.2  --   --  31.5 31.6  RDW 16.0*  --   --  15.9* 15.5  PLT 175  --   --  164 161   Thyroid  Recent Labs  Lab 10/02/23 1359  TSH 0.880    BNP Recent Labs  Lab 10/04/23 0314  BNP 526.4*    DDimer No results for input(s): "DDIMER" in the last 168 hours.   Radiology/Studies:  CT ABDOMEN PELVIS W CONTRAST  Result Date: 10/02/2023 CLINICAL DATA:  Lethargy.  History of UTI. EXAM: CT  ABDOMEN AND PELVIS WITH CONTRAST TECHNIQUE: Multidetector CT imaging of the abdomen and pelvis was performed using the standard protocol following bolus administration of intravenous contrast. RADIATION DOSE REDUCTION: This exam was performed according to the departmental dose-optimization program which includes automated exposure control, adjustment of the mA and/or kV according to patient size and/or use of iterative reconstruction technique. CONTRAST:  75mL OMNIPAQUE IOHEXOL 350 MG/ML SOLN COMPARISON:  05/10/2023. FINDINGS: Lower chest: No acute abnormality. Hepatobiliary: No focal liver abnormality is seen. No gallstones, gallbladder wall thickening, or biliary dilatation. Pancreas: Unremarkable. No pancreatic ductal dilatation or surrounding inflammatory changes. Spleen: Normal in size without focal abnormality. Adrenals/Urinary Tract: No adrenal mass. Kidneys normal in overall size, orientation and position with symmetric enhancement and excretion. Bilateral stable renal cysts measuring up to 1.8 cm. No follow-up indicated. No stones. Mild prominence of both intrarenal collecting systems and proximal ureters. No ureteral stone. Ureters not well-defined, but grossly normal in course and in caliber. Mild prominence of the bladder wall without overt thickening. No bladder mass or stone. Stomach/Bowel: Stomach is decompressed. There is low attenuation infiltrating the fat surrounding the stomach extending to the adjacent transverse colon. No convincing stomach wall thickening. Small bowel and colon are normal in caliber. No wall thickening or convincing inflammation. Vascular/Lymphatic: Aortic atherosclerosis. No aneurysm. No enlarged lymph nodes. Reproductive: Heterogeneous prostate, mildly enlarged. Other: No  abdominal wall hernia or abnormality. No abdominopelvic ascites. Musculoskeletal: No fracture or acute finding. Skeletal structures are demineralized. There is a mottled type appearance of the skeleton with  small ill-defined lucencies that are felt to be due to the diffuse bony demineralization. No defined bone lesion and no change. IMPRESSION: 1. There is hazy low-attenuation fat surrounds the stomach mass suggests inflammation/edema, but there is no evidence of stomach wall thickening. This still may reflect gastritis, but is nonspecific. 2. Mild prominence of the intrarenal collecting systems and proximal ureters. Bladder wall appears prominent but not overly thickened. Consider cystitis in the proper clinical setting. No CT evidence of pyelonephritis. 3. No other evidence of an acute abnormality within the abdomen or pelvis. 4. Aortic atherosclerosis. Aortic Atherosclerosis (ICD10-I70.0). Electronically Signed   By: Amie Portland M.D.   On: 10/02/2023 16:59   DG Chest Portable 1 View  Result Date: 10/02/2023 CLINICAL DATA:  Altered mental status. EXAM: PORTABLE CHEST 1 VIEW COMPARISON:  09/01/2023 FINDINGS: There is a left chest wall single lead pacer device with lead terminating in the right ventricle. Status post median sternotomy CABG procedure. Stable cardiomediastinal contours. Blunting of the left costophrenic angle identified, unchanged from previous exam. Scar like densities noted in the left base. No signs of interstitial edema, airspace consolidation or pneumothorax. Bilateral glenohumeral joint osteoarthritis with marked narrowing of the acromial humeral interval bilaterally. IMPRESSION: 1. No acute cardiopulmonary abnormalities. 2. Chronic blunting of the left costophrenic angle. 3. Bilateral glenohumeral joint osteoarthritis with marked narrowing of the acromial humeral interval bilaterally. Electronically Signed   By: Signa Kell M.D.   On: 10/02/2023 13:34     Assessment and Plan:   Aortic valve calcification  -Most recent echocardiogram 05/11/2023 was performed for preop evaluation, LVEF 40 to 45%, global hypokinesis, mild concentric LVH, significant dyssynchrony due to LBBB, normal RV,  severe calcification of aortic valve, aortic valve appears severely stenotic by visualization, limited Doppler evaluation of AAS with mean gradient 5 mmHg, mild MR, moderate to severe MAC -Previous echo 04/14/2021 revealed LVEF 35 to 40%, global hypokinesis, moderately reduced RV, mildly enlarged RV, mildly elevated PASP, severe LAE, moderate RAE, mild MR, severe MAC, moderate TR, severe calcification of aortic valve, mild to moderate aortic sclerosis/calcification, no evidence of aortic stenosis -Echo dating back to 2020 showed LVEF 50 to 55%, moderate MAC, severe aortic valve annular calcification without significant AV gradient -Reviewed most recent echo from July 2024 with Dr. Wyline Mood today, not appears to be severe aortic stenosis  - He has no clinical symptoms concerning for severe AS as well  - Continue surveillance echo outpatient follow-up with Dr. Tresa Endo  Chronic combined heart failure -He is euvolemic on exam, not in decompensated CHF -Hold Lasix in the setting of severe diarrhea/dehydration -May resume Lasix when diarrhea resolves and patient maintains adequate p.o. intake, he is Lasix 20 mg every other day PTA -GDMT: on PTA losartan 25 mg daily Imdur 30 mg daily; given reduced LVEF, he is a candidate for Entresto if cost is not an issue, may switch to Entresto 24/26 mg twice daily; if cost is an issue, may continue losartan 25 mg daily; BP low normal, would not add back Imdur at this time; SGLT2 may be considered if patient wishes  Permanent atrial fibrillation Complete heart block s/p [employer] pacemaker 08/31/2023 -Currently paced -Will ask representative to interrogate device to ensure normal function -He is on Eliquis 2.5 mg twice daily (89 yr old and <60kg),  please continue  Risk Assessment/Risk Scores:   New York Heart Association (NYHA) Functional Class NYHA Class II  CHA2DS2-VASc Score = 6   This indicates a 9.7% annual risk of stroke. The patient's score is based  upon: CHF History: 1 HTN History: 1 Diabetes History: 1 Stroke History: 0 Vascular Disease History: 1 Age Score: 2 Gender Score: 0        For questions or updates, please contact [facility] Please consult [email or web address] for contact info under    Signed, Cyndi Bender, NP  10/04/2023 12:31 PM'

## 2023-10-04 NOTE — Progress Notes (Signed)
PROGRESS NOTE    Dakota [name]  ZOX:[id number] DOB: 1926-03-03 DOA: 10/02/2023 PCP: [name], MD    Brief Narrative:  89 year old with history of CAD, HTN, COPD, DM 2, CHF, A-fib on Eliquis, complete heart block status post pacemaker comes to the hospital complains of fatigue and diarrhea.  Recent history of complete heart block about a month ago had a pacemaker placed, postop course complicated by hematoma treated conservatively and with Keflex.  Now is back on Eliquis.  CT scan negative for any colitis possible gastritis   Assessment & Plan:  Principal Problem:   Lactic acidosis Active Problems:   COPD, moderate (HCC)   Diabetes (HCC)   Hypercholesterolemia   Chronic systolic CHF (congestive heart failure) (HCC)   Debility   CAD (coronary artery disease) of bypass graft   Essential hypertension   Atrial fibrillation with slow ventricular response (HCC)   Diarrhea   Dehydration   Hyponatremia   Normocytic anemia    Diarrhea/fatigue Dehydration/hyponatremia - CT abdomen/pelvis questionable for mild gastritis and cystitis.  Procalcitonin is negative.  UA negative, cultures pending -Continue PPI.  Holding off on home bowel regimen - Follow-up cultures.  Currently holding off on antibiotics  Cough/congestion - Possible some aspiration.  Bronchodilators, I-S/flutter valve.  Check BNP and procalcitonin  Hypocalcemia - Repletion  History of COPD -As needed bronchodilators  History of coronary artery disease status post CABG Congestive heart failure, EF 45% -Echocardiogram in August showed severe aortic stenosis Cardiology team will see the patient in the hospital received any further recommendations Will slowly resume home cardiac meds.   Diabetes mellitus type 2 -On metformin at home.  sliding scale and Accu-Cheks  Atrial fibrillation, chronic Symptomatic bradycardia with CHB status post pacemaker -Continue Eliquis  BPH -Supportive  care  Hyperlipidemia -Statin  Chronic debility -Age-related.  Eventually we can consider PT/OT  Anemia of chronic disease -Hemoglobin around baseline of 10.6  PT/OT-SNF  DVT prophylaxis: Eliquis Code Status: Full code Family Communication: Daughter at bedside SNF placement   Subjective:  Seen and bedside, no new complaints Over still feels weak  Examination:  General exam: Appears calm and comfortable, mildly frail and cachectic.  Hard of hearing Respiratory system: Clear to auscultation. Respiratory effort normal. Cardiovascular system: S1 & S2 heard, RRR. No JVD, murmurs, rubs, gallops or clicks. No pedal edema. Gastrointestinal system: Abdomen is nondistended, soft and nontender. No organomegaly or masses felt. Normal bowel sounds heard. Central nervous system: Alert and oriented. No focal neurological deficits. Extremities: Symmetric 4 x 5 power. Skin: No rashes, lesions or ulcers Psychiatry: Judgement and insight appear normal. Mood & affect appropriate.            Pressure Injury 10/03/23 Buttocks Bilateral;Right Stage 2 -  Partial thickness loss of dermis presenting as a shallow open injury with a red, pink wound bed without slough. (Active)  10/03/23 0200  Location: Buttocks  Location Orientation: Bilateral;Right  Staging: Stage 2 -  Partial thickness loss of dermis presenting as a shallow open injury with a red, pink wound bed without slough.  Wound Description (Comments):   Present on Admission: Yes     Pressure Injury 10/03/23 Heel Left Unstageable - Full thickness tissue loss in which the base of the injury is covered by slough (yellow, tan, gray, green or brown) and/or eschar (tan, brown or black) in the wound bed. (Active)  10/03/23 0200  Location: Heel  Location Orientation: Left  Staging: Unstageable - Full thickness tissue loss in which the  base of the injury is covered by slough (yellow, tan, gray, green or brown) and/or eschar (tan, brown or  black) in the wound bed.  Wound Description (Comments):   Present on Admission: Yes     Pressure Injury 10/03/23 Heel Right (Active)  10/03/23 0200  Location: Heel  Location Orientation: Right  Staging:   Wound Description (Comments):   Present on Admission:      Diet Orders (From admission, onward)     Start     Ordered   10/03/23 0043  Diet Carb Modified Fluid consistency: Thin; Room service appropriate? Yes  Diet effective now       Question Answer Comment  Diet-HS Snack? Nothing   Calorie Level Medium 1600-2000   Fluid consistency: Thin   Room service appropriate? Yes      10/03/23 0042            Objective: Vitals:   10/03/23 2026 10/03/23 2308 10/04/23 0355 10/04/23 0752  BP: (!) 106/51 (!) 104/55 (!) 100/55 (!) 110/59  Pulse: 70 78 70 77  Resp: 12 19 19 17   Temp:  97.6 F (36.4 C) 98 F (36.7 C) (!) 97.5 F (36.4 C)  TempSrc: Oral Oral Oral Oral  SpO2: 98% 100% 100% 99%  Weight:      Height:        Intake/Output Summary (Last 24 hours) at 10/04/2023 1124 Last data filed at 10/04/2023 0854 Gross per 24 hour  Intake 680 ml  Output 650 ml  Net 30 ml   Filed Weights   10/02/23 1231 10/03/23 0200  Weight: 45.6 kg 40.1 kg    Scheduled Meds:  apixaban  2.5 mg Oral BID   feeding supplement  237 mL Oral BID BM   ferrous sulfate  325 mg Oral Q breakfast   insulin aspart  0-9 Units Subcutaneous Q4H   ketorolac  1 drop Left Eye QID   mirabegron ER  50 mg Oral QHS   pantoprazole (PROTONIX) IV  40 mg Intravenous QHS   pravastatin  80 mg Oral QHS   prednisoLONE acetate  1 drop Left Eye QID   predniSONE  5 mg Oral Daily   Continuous Infusions:  Nutritional status     Body mass index is 13.85 kg/m.  Data Reviewed:   CBC: Recent Labs  Lab 10/02/23 1340 10/03/23 0044 10/03/23 0405 10/04/23 0314  WBC 5.7  --  6.3 6.4  NEUTROABS 4.2  --   --   --   HGB 10.1* 9.9* 10.6* 9.0*  HCT 32.4* 29.0* 33.6* 28.5*  MCV 62.4*  --  61.3* 60.9*  PLT 175   --  164 161   Basic Metabolic Panel: Recent Labs  Lab 10/02/23 2352 10/02/23 2354 10/03/23 0405 10/03/23 1034 10/03/23 1620 10/03/23 2232 10/04/23 0314  NA  --    < > 136 134* 133* 132* 134*  K  --    < > 3.7 3.5 4.5 4.3 4.1  CL  --    < > 101 99 100 99 100  CO2  --    < > 26 25 22 26 27   GLUCOSE  --    < > 106* 163* 325* 162* 97  BUN  --    < > 12 11 18 22 20   CREATININE  --    < > 0.75 0.78 1.12 1.00 0.85  CALCIUM  --    < > 8.4* 8.3* 8.8* 8.6* 8.4*  MG 1.6*  --  2.3  --   --   --  2.0  PHOS 3.5  --  3.7  --   --   --   --    < > = values in this interval not displayed.   GFR: Estimated Creatinine Clearance: 29.5 mL/min (by C-G formula based on SCr of 0.85 mg/dL). Liver Function Tests: Recent Labs  Lab 10/02/23 1340 10/02/23 2354 10/03/23 0405  AST 25 23 21   ALT 16 15 16   ALKPHOS 49 45 47  BILITOT 0.9 0.9 1.2*  PROT 5.8* 5.4* 5.9*  ALBUMIN 3.2* 3.0* 3.2*   No results for input(s): "LIPASE", "AMYLASE" in the last 168 hours. No results for input(s): "AMMONIA" in the last 168 hours. Coagulation Profile: No results for input(s): "INR", "PROTIME" in the last 168 hours. Cardiac Enzymes: Recent Labs  Lab 10/02/23 2354  CKTOTAL 21*   BNP (last 3 results) No results for input(s): "PROBNP" in the last 8760 hours. HbA1C: Recent Labs    10/02/23 2330  HGBA1C 6.8*   CBG: Recent Labs  Lab 10/03/23 1603 10/03/23 2015 10/03/23 2354 10/04/23 0429 10/04/23 0848  GLUCAP 322* 247* 137* 105* 133*   Lipid Profile: No results for input(s): "CHOL", "HDL", "LDLCALC", "TRIG", "CHOLHDL", "LDLDIRECT" in the last 72 hours. Thyroid Function Tests: Recent Labs    10/02/23 1359  TSH 0.880   Anemia Panel: Recent Labs    10/02/23 1359 10/02/23 2354 10/03/23 0405  VITAMINB12  --   --  3,061*  FOLATE  --   --  30.4  FERRITIN  --  216  --   TIBC  --  225*  --   IRON  --  102  --   RETICCTPCT 1.0  --   --    Sepsis Labs: Recent Labs  Lab 10/02/23 1340  10/02/23 2354 10/03/23 0402  PROCALCITON  --  <0.10  --   LATICACIDVEN 2.5* 0.8 1.1    Recent Results (from the past 240 hour(s))  Culture, blood (routine x 2)     Status: None (Preliminary result)   Collection Time: 10/02/23 12:59 PM   Specimen: BLOOD RIGHT HAND  Result Value Ref Range Status   Specimen Description BLOOD RIGHT HAND  Final   Special Requests   Final    BOTTLES DRAWN AEROBIC ONLY Blood Culture results may not be optimal due to an inadequate volume of blood received in culture bottles   Culture   Final    NO GROWTH 2 DAYS Performed at [facility], [address], [place], [place] [address]    Report Status PENDING  Incomplete  Urine Culture     Status: None (Preliminary result)   Collection Time: 10/02/23  5:36 PM   Specimen: Urine, Clean Catch  Result Value Ref Range Status   Specimen Description URINE, CLEAN CATCH  Final   Special Requests NONE  Final   Culture   Final    CULTURE REINCUBATED FOR BETTER GROWTH Performed at [facility], [address], [place], [place] [address]    Report Status PENDING  Incomplete  Culture, blood (routine x 2)     Status: None (Preliminary result)   Collection Time: 10/03/23  4:00 AM   Specimen: BLOOD RIGHT ARM  Result Value Ref Range Status   Specimen Description BLOOD RIGHT ARM  Final   Special Requests   Final    BOTTLES DRAWN AEROBIC ONLY Blood Culture results may not be optimal due to an inadequate volume of blood received in culture bottles   Culture   Final  NO GROWTH 1 DAY Performed at [facility], [address] [place], [place] [address]    Report Status PENDING  Incomplete         Radiology Studies: CT ABDOMEN PELVIS W CONTRAST  Result Date: 10/02/2023 CLINICAL DATA:  Lethargy.  History of UTI. EXAM: CT ABDOMEN AND PELVIS WITH CONTRAST TECHNIQUE: Multidetector CT imaging of the abdomen and pelvis was performed using the standard protocol following bolus administration of  intravenous contrast. RADIATION DOSE REDUCTION: This exam was performed according to the departmental dose-optimization program which includes automated exposure control, adjustment of the mA and/or kV according to patient size and/or use of iterative reconstruction technique. CONTRAST:  75mL OMNIPAQUE IOHEXOL 350 MG/ML SOLN COMPARISON:  05/10/2023. FINDINGS: Lower chest: No acute abnormality. Hepatobiliary: No focal liver abnormality is seen. No gallstones, gallbladder wall thickening, or biliary dilatation. Pancreas: Unremarkable. No pancreatic ductal dilatation or surrounding inflammatory changes. Spleen: Normal in size without focal abnormality. Adrenals/Urinary Tract: No adrenal mass. Kidneys normal in overall size, orientation and position with symmetric enhancement and excretion. Bilateral stable renal cysts measuring up to 1.8 cm. No follow-up indicated. No stones. Mild prominence of both intrarenal collecting systems and proximal ureters. No ureteral stone. Ureters not well-defined, but grossly normal in course and in caliber. Mild prominence of the bladder wall without overt thickening. No bladder mass or stone. Stomach/Bowel: Stomach is decompressed. There is low attenuation infiltrating the fat surrounding the stomach extending to the adjacent transverse colon. No convincing stomach wall thickening. Small bowel and colon are normal in caliber. No wall thickening or convincing inflammation. Vascular/Lymphatic: Aortic atherosclerosis. No aneurysm. No enlarged lymph nodes. Reproductive: Heterogeneous prostate, mildly enlarged. Other: No abdominal wall hernia or abnormality. No abdominopelvic ascites. Musculoskeletal: No fracture or acute finding. Skeletal structures are demineralized. There is a mottled type appearance of the skeleton with small ill-defined lucencies that are felt to be due to the diffuse bony demineralization. No defined bone lesion and no change. IMPRESSION: 1. There is hazy  low-attenuation fat surrounds the stomach mass suggests inflammation/edema, but there is no evidence of stomach wall thickening. This still may reflect gastritis, but is nonspecific. 2. Mild prominence of the intrarenal collecting systems and proximal ureters. Bladder wall appears prominent but not overly thickened. Consider cystitis in the proper clinical setting. No CT evidence of pyelonephritis. 3. No other evidence of an acute abnormality within the abdomen or pelvis. 4. Aortic atherosclerosis. Aortic Atherosclerosis (ICD10-I70.0). Electronically Signed   By: Amie Portland M.D.   On: 10/02/2023 16:59   DG Chest Portable 1 View  Result Date: 10/02/2023 CLINICAL DATA:  Altered mental status. EXAM: PORTABLE CHEST 1 VIEW COMPARISON:  09/01/2023 FINDINGS: There is a left chest wall single lead pacer device with lead terminating in the right ventricle. Status post median sternotomy CABG procedure. Stable cardiomediastinal contours. Blunting of the left costophrenic angle identified, unchanged from previous exam. Scar like densities noted in the left base. No signs of interstitial edema, airspace consolidation or pneumothorax. Bilateral glenohumeral joint osteoarthritis with marked narrowing of the acromial humeral interval bilaterally. IMPRESSION: 1. No acute cardiopulmonary abnormalities. 2. Chronic blunting of the left costophrenic angle. 3. Bilateral glenohumeral joint osteoarthritis with marked narrowing of the acromial humeral interval bilaterally. Electronically Signed   By: Signa Kell M.D.   On: 10/02/2023 13:34           LOS: 1 day   Time spent= 35 mins    Paizley Ramella Zoila Shutter, MD [facility]  If 7PM-7AM, please  contact night-coverage  10/04/2023, 11:24 AM

## 2023-10-04 NOTE — TOC Initial Note (Signed)
Transition of Care [facility]) - Initial/Assessment Note    Patient Details  Name: Dakota [name] MRN: [id number] Date of Birth: Mar 01, 1926  [facility]) CM/SW Contact:    Dakota Roux, Dakota [name] Phone Number: 10/04/2023, 3:57 PM  Clinical Narrative:                  CSW met with patient's daughter, Dakota [name] at bedside. CSW introduced self and explained role. Nadeema states self and patient lives in the home. She is aware of recommendation and is agreeable to  short term rehab at [facility]. CSW explained the SNF process. No preferred facility at this time. All questions answered.   TOC will provide bed offers once available TOC will continue to follow and assist with discharge planning.  Antony Blackbird, MSW, Dakota [name] Clinical Social Worker     Expected Discharge Plan: Skilled Nursing Facility Barriers to Discharge: Continued Medical Work up, English as a second language teacher, SNF Pending bed offer   Patient Goals and CMS Choice            Expected Discharge Plan and Services In-house Referral: Clinical Social Work                                            Prior Living Arrangements/Services   Lives with:: Self, Adult Children Patient language and need for interpreter reviewed:: No        Need for Family Participation in Patient Care: Yes (Comment)     Criminal Activity/Legal Involvement Pertinent to Current Situation/Hospitalization: No - Comment as needed  Activities of Daily Living   ADL Screening (condition at time of admission) Independently performs ADLs?: No Does the patient have a NEW difficulty with bathing/dressing/toileting/self-feeding that is expected to last >3 days?: Yes (Initiates electronic notice to provider for possible OT consult) Does the patient have a NEW difficulty with getting in/out of bed, walking, or climbing stairs that is expected to last >3 days?: Yes (Initiates electronic notice to provider for possible PT consult) Does the patient have  a NEW difficulty with communication that is expected to last >3 days?: Yes (Initiates electronic notice to provider for possible SLP consult) Is the patient deaf or have difficulty hearing?: Yes Does the patient have difficulty seeing, even when wearing glasses/contacts?: No Does the patient have difficulty concentrating, remembering, or making decisions?: No  Permission Sought/Granted                  Emotional Assessment         Alcohol / Substance Use: Not Applicable Psych Involvement: No (comment)  Admission diagnosis:  Dehydration [E86.0] Lactic acidosis [E87.20] Hyponatremia [E87.1] Diarrhea, unspecified type [R19.7] Sepsis (HCC) [A41.9] Patient Active Problem List   Diagnosis Date Noted   Protein-calorie malnutrition, severe 10/04/2023   Sepsis (HCC) 10/03/2023   Lactic acidosis 10/02/2023   Diarrhea 10/02/2023   Dehydration 10/02/2023   Hyponatremia 10/02/2023   Normocytic anemia 10/02/2023   Atrial fibrillation with slow ventricular response (HCC) 08/31/2023   Symptomatic bradycardia 08/29/2023   SBO (small bowel obstruction) (HCC) 05/10/2023   Inguinal hernia of right side with obstruction 05/10/2023   Cardiac arrhythmia 11/26/2019   Chest pain 04/10/2018   Nonspecific chest pain    Essential hypertension 09/07/2016   COPD, moderate (HCC) 06/13/2015   CAD (coronary artery disease) of bypass graft 05/23/2015   Coronary artery disease involving native coronary artery of  native heart with angina pectoris (HCC)    Abnormal nuclear stress test    Ischemic chest pain (HCC)    Cough 09/26/2012   Debility 04/11/2012   Hx of CABG 04/03/2011   Left bundle branch block 04/03/2011   Diabetes (HCC) 04/03/2011   Hypercholesterolemia 04/03/2011   Chronic systolic CHF (congestive heart failure) (HCC) 04/03/2011   PCP:  [name], MD Pharmacy:   [facility] [address] [place] - [address] [place]  [address] Phone: [phone] Fax: [phone]  [facility] [address] [place] [address] Phone: [phone] Fax: [phone]     Social Determinants of Health (SDOH) Social History: SDOH Screenings   Food Insecurity: No Food Insecurity (10/03/2023)  Housing: Low Risk  (10/03/2023)  Transportation Needs: No Transportation Needs (10/03/2023)  Utilities: Not At Risk (10/03/2023)  Tobacco Use: Low Risk  (10/03/2023)   SDOH Interventions:     Readmission Risk Interventions    09/02/2023    1:43 PM  Readmission Risk Prevention Plan  Transportation Screening Complete  Home Care Screening Complete  Medication Review (RN CM) Complete

## 2023-10-04 NOTE — Progress Notes (Signed)
Initial Nutrition Assessment  DOCUMENTATION CODES:   Severe malnutrition in context of chronic illness, Underweight  INTERVENTION:   -Recommend to liberalize diet to regular, provide feeding assist as needed.  -Provide -1 packet Juven (Halol) BID, each packet provides 95 calories, 2.5 grams of protein (collagen), and 9.8 grams of carbohydrate (3 grams sugar); also contains 7 grams of L-arginine and L-glutamine, 300 mg vitamin C, 15 mg vitamin E, 1.2 mcg vitamin B-12, 9.5 mg zinc, 200 mg calcium, and 1.5 g  Calcium Beta-hydroxy-Beta-methylbutyrate to support wound healing.  -Continue Ensure Enlive po BID, each supplement provides 350 kcal and 20 grams of protein.  -MVI/Minerals-1 Tab daily for micronutrient support to support pressure injury healing.    NUTRITION DIAGNOSIS:   Severe Malnutrition related to chronic illness, catabolic illness, diarrhea (COPD) as evidenced by severe muscle depletion, moderate muscle depletion, moderate fat depletion, severe fat depletion, percent weight loss.    GOAL:   Patient will meet greater than or equal to 90% of their needs    MONITOR:   PO intake, Labs, Supplement acceptance, Weight trends, Skin  REASON FOR ASSESSMENT:   Consult Assessment of nutrition requirement/status, Diet education  ASSESSMENT: 89 y/o male presented with diarrhea and fatigue. Recent hospital stay for heart block S/P pacemaker placement.  Postop course complicated by hematoma treated conservatively.  PMH: CAD, HTN, COPD, DM2, CHF, A-fib, complete heart block S/P pacemaker placement, aortic atherosclerosis, chronic chest pain, chronic dyspnea, cognitive impairment, edema of both lower extremities, GERD, smallpox, HOH, ischemic heart disease, LBBB, R arm weakness, S/P CABG x 4, S/P drug elucidating coronary stent placement, Thalassemia minor.  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 He does allow an NFPE and states he would like  hot tea. Per RN, his daughter fed him B this morning. Intakes recorded 50-70% x 3 meals. Attempt to obtain diet hx when family available, offer diet education if appropriate. Per EMR, weight loss of 17% from 07/05/23 weight of 48.9 kg.   Medications reviewed and include ferrous sulfate 325 mg daily with B, novolog SS 3x daily Q4 hrs, PPI, prednisone.  Labs: CBG 83-322 past 24 hrs, hgb A1C 6.8%  NUTRITION - FOCUSED PHYSICAL EXAM:  Flowsheet Row Most Recent Value  Orbital Region Moderate depletion  Upper Arm Region Severe depletion  Thoracic and Lumbar Region Moderate depletion  Buccal Region Mild depletion  Temple Region Moderate depletion  Clavicle Bone Region Severe depletion  Clavicle and Acromion Bone Region Severe depletion  Scapular Bone Region Severe depletion  Dorsal Hand Moderate depletion  Patellar Region Severe depletion  Anterior Thigh Region Severe depletion  Posterior Calf Region Severe depletion  Edema (RD Assessment) None  Hair Reviewed  Eyes Reviewed  Mouth Reviewed  Skin Reviewed  Nails Reviewed       Diet Order:   Diet Order             Diet Carb Modified Fluid consistency: Thin; Room service appropriate? Yes  Diet effective now                   EDUCATION NEEDS:   Not appropriate for education at this time  Skin:  Skin Assessment: Skin Integrity Issues: Skin Integrity Issues:: Stage II, Unstageable, Other (Comment) Stage II: buttock Unstageable: L heel Other: pressure injury R heel  Last BM:  10/03/23  Height:   Ht Readings from Last 1 Encounters:  10/03/23 5\' 7"  (1.702 m)    Weight:   Wt Readings  from Last 1 Encounters:  10/03/23 40.1 kg    Ideal Body Weight:  67.3 kg  BMI:  Body mass index is 13.85 kg/m.  Estimated Nutritional Needs:   Kcal:  1450-1650 kcal/day  Protein:  60-80 gm/day  Fluid:  1500-1650 mL/day    Alvino Chapel, RDLD Clinical Dietitian If unable to reach, please contact "RD Inpatient" secure chat  group between 8 am-4 pm daily"

## 2023-10-04 NOTE — Plan of Care (Signed)
  Problem: Education: Goal: Ability to describe self-care measures that may prevent or decrease complications (Diabetes Survival Skills Education) will improve Outcome: Progressing   Problem: Coping: Goal: Ability to adjust to condition or change in health will improve 10/04/2023 0017 by Carolanne Grumbling, RN Outcome: Progressing 10/03/2023 2352 by Carolanne Grumbling, RN Outcome: Progressing   Problem: Skin Integrity: Goal: Risk for impaired skin integrity will decrease Outcome: Progressing

## 2023-10-05 DIAGNOSIS — E872 Acidosis, unspecified: Secondary | ICD-10-CM | POA: Diagnosis not present

## 2023-10-05 LAB — CBC
HCT: 29.5 % — ABNORMAL LOW (ref 39.0–52.0)
Hemoglobin: 9.3 g/dL — ABNORMAL LOW (ref 13.0–17.0)
MCH: 19.1 pg — ABNORMAL LOW (ref 26.0–34.0)
MCHC: 31.5 g/dL (ref 30.0–36.0)
MCV: 60.5 fL — ABNORMAL LOW (ref 80.0–100.0)
Platelets: 159 10*3/uL (ref 150–400)
RBC: 4.88 MIL/uL (ref 4.22–5.81)
RDW: 15.6 % — ABNORMAL HIGH (ref 11.5–15.5)
WBC: 7.1 10*3/uL (ref 4.0–10.5)
nRBC: 0 % (ref 0.0–0.2)

## 2023-10-05 LAB — GLUCOSE, CAPILLARY
Glucose-Capillary: 110 mg/dL — ABNORMAL HIGH (ref 70–99)
Glucose-Capillary: 103 mg/dL — ABNORMAL HIGH (ref 70–99)
Glucose-Capillary: 188 mg/dL — ABNORMAL HIGH (ref 70–99)
Glucose-Capillary: 207 mg/dL — ABNORMAL HIGH (ref 70–99)
Glucose-Capillary: 175 mg/dL — ABNORMAL HIGH (ref 70–99)

## 2023-10-05 LAB — URINE CULTURE

## 2023-10-05 LAB — BASIC METABOLIC PANEL
Anion gap: 8 (ref 5–15)
BUN: 24 mg/dL — ABNORMAL HIGH (ref 8–23)
CO2: 27 mmol/L (ref 22–32)
Calcium: 8.6 mg/dL — ABNORMAL LOW (ref 8.9–10.3)
Chloride: 100 mmol/L (ref 98–111)
Creatinine, Ser: 0.66 mg/dL (ref 0.61–1.24)
GFR, Estimated: >60 mL/min (ref >60)
Glucose, Bld: 116 mg/dL — ABNORMAL HIGH (ref 70–99)
Potassium: 4.1 mmol/L (ref 3.5–5.1)
Sodium: 135 mmol/L (ref 135–145)

## 2023-10-05 LAB — MAGNESIUM: Magnesium: 1.7 mg/dL (ref 1.7–2.4)

## 2023-10-05 MED ORDER — PANTOPRAZOLE SODIUM 40 MG PO TBEC
40.0000 mg | DELAYED_RELEASE_TABLET | Freq: Every day | ORAL | Status: DC
  Administered 2023-10-05 – 2023-10-07 (×3): 40 mg via ORAL
  Filled 2023-10-05 (×3): qty 1

## 2023-10-05 MED ORDER — IPRATROPIUM-ALBUTEROL 0.5-2.5 (3) MG/3ML IN SOLN
3.0000 mL | Freq: Two times a day (BID) | RESPIRATORY_TRACT | Status: DC
  Administered 2023-10-05 – 2023-10-07 (×5): 3 mL via RESPIRATORY_TRACT
  Filled 2023-10-05 (×4): qty 3

## 2023-10-05 MED ORDER — IPRATROPIUM-ALBUTEROL 0.5-2.5 (3) MG/3ML IN SOLN: 3.0000 mL | Freq: Two times a day (BID) | RESPIRATORY_TRACT | Status: DC

## 2023-10-05 NOTE — TOC Progression Note (Signed)
Transition of Care [facility]) - Progression Note    Patient Details  Name: Dakota [name] MRN: [id number] Date of Birth: [birth year]  Transition of Care [facility]) CM/SW Contact  Eduard Roux, [place] Phone Number: 10/05/2023, 3:03 PM  Clinical Narrative:     CSW spoke with patient's daughter to inform of bed offers. Family requested to leave SNF offers in the room with the patient.   Antony Blackbird, MSW, LCSW Clinical Social Worker    Expected Discharge Plan: Skilled Nursing Facility Barriers to Discharge: Continued Medical Work up, English as a second language teacher, SNF Pending bed offer  Expected Discharge Plan and Services In-house Referral: Clinical Social Work                                             Social Determinants of Health (SDOH) Interventions SDOH Screenings   Food Insecurity: No Food Insecurity (10/03/2023)  Housing: Low Risk  (10/03/2023)  Transportation Needs: No Transportation Needs (10/03/2023)  Utilities: Not At Risk (10/03/2023)  Tobacco Use: Low Risk  (10/03/2023)    Readmission Risk Interventions    09/02/2023    1:43 PM  Readmission Risk Prevention Plan  Transportation Screening Complete  Home Care Screening Complete  Medication Review (RN CM) Complete

## 2023-10-05 NOTE — Progress Notes (Signed)
Physical Therapy Treatment Patient Details Name: Dakota [name] MRN: [id number] DOB: [birth year] Today's Date: 10/05/2023   History of Present Illness Pt is a 89 y.o. male admitted 10/02/23 with diarrhea, fatigue. Workup for dehydration, hyponatremia. PMH includes CHB (s/p PPM 08/31/23), CAD (s/p CABG), HTN, COPD, DM2, CHF, afib on Eliquis, chronic hernia.    PT Comments  Pt modA to transfer to EOB, modAx2 to complete sit to stand, step pvt transfer to recliner. Pt with strong L lateral lean even when supportive sitting in recliner. Worked on marching in [place] however pt fatigues quickly. Pt remains appropriate for inpatient rehab < 3 hours a day to achieve safe level of function for daughter to take care of him at home. Acute PT to cont to follow.    If plan is discharge home, recommend the following: A lot of help with walking and/or transfers;A lot of help with bathing/dressing/bathroom;Assistance with cooking/housework;Direct supervision/assist for medications management;Direct supervision/assist for financial management;Assist for transportation;Help with stairs or ramp for entrance   Can travel by private vehicle     No  Equipment Recommendations   (defer to next venue)    Recommendations for Other Services       Precautions / Restrictions Precautions Precautions: Fall Precaution Comments: Pacemaker placement 08/31/23 Restrictions Weight Bearing Restrictions: No     Mobility  Bed Mobility Overal bed mobility: Needs Assistance Bed Mobility: Supine to Sit     Supine to sit: Mod assist     General bed mobility comments: HOB elevated, with verbal cues pt able to slowly move LEs to EOB, HOB elevated, modA for trunk elevation and to scoot to EOB, pt with strong L lateral lean    Transfers Overall transfer level: Needs assistance Equipment used: Rolling walker (2 wheels) (face to face transfer) Transfers: Sit to/from Stand, Bed to chair/wheelchair/BSC Sit to Stand: Mod assist    Step pivot transfers: Mod assist, Max assist, +2 physical assistance       General transfer comment: modA to power up with face to face transfer, maxA for step pvt to initiate weight shift to advance LEs to transfer to chair, modAX2 to power up from recliner to rolling walker and to safely descend to chair, pt with strong L lateral lean    Ambulation/Gait               General Gait Details: completed marching x 10 reps total in RW in standing with max directional verbal cues, modA to maintain upright position, quick onset of fatigue   Stairs             Wheelchair Mobility     Tilt Bed    Modified Rankin (Stroke Patients Only)       Balance Overall balance assessment: Needs assistance Sitting-balance support: No upper extremity supported, Single extremity supported Sitting balance-Leahy Scale: Poor Sitting balance - Comments: reliant on bilat UE support, strong L lateral lean, varied support from contact guard for short period of times to modA   Standing balance support: Bilateral upper extremity supported, Reliant on assistive device for balance Standing balance-Leahy Scale: Poor Standing balance comment: reliant on BUE support and minA to maintain static stand while BP checked                            Cognition Arousal: Alert Behavior During Therapy: Flat affect Overall Cognitive Status: Difficult to assess  General Comments: pt HOH, dtr to bring in hearing aides. Able to follow commands, appreciative of help        Exercises General Exercises - Lower Extremity Long Arc Quad: AROM, Both, 10 reps, Seated Hip Flexion/Marching: AROM, Both, 10 reps, Seated    General Comments General comments (skin integrity, edema, etc.): pt with noted skin lesion on R heel, VSS      Pertinent Vitals/Pain Pain Assessment Pain Assessment: Faces Faces Pain Scale: Hurts little more Pain Location: in general  with movement Pain Descriptors / Indicators: Discomfort    Home Living                          Prior Function            PT Goals (current goals can now be found in the care plan section) Acute Rehab PT Goals PT Goal Formulation: With patient/family Time For Goal Achievement: 10/17/23 Potential to Achieve Goals: Good Progress towards PT goals: Progressing toward goals    Frequency    Min 1X/week      PT Plan      Co-evaluation              AM-PAC PT "6 Clicks" Mobility   Outcome Measure  Help needed turning from your back to your side while in a flat bed without using bedrails?: A Lot Help needed moving from lying on your back to sitting on the side of a flat bed without using bedrails?: A Lot Help needed moving to and from a bed to a chair (including a wheelchair)?: A Lot Help needed standing up from a chair using your arms (e.g., wheelchair or bedside chair)?: A Lot Help needed to walk in hospital room?: Total Help needed climbing 3-5 steps with a railing? : Total 6 Click Score: 10    End of Session Equipment Utilized During Treatment: Gait belt Activity Tolerance: Patient tolerated treatment well;Patient limited by fatigue Patient left: in chair;with call bell/phone within reach;with chair alarm set;with family/visitor present Nurse Communication: Mobility status PT Visit Diagnosis: Other abnormalities of gait and mobility (R26.89);Muscle weakness (generalized) (M62.81)     Time: 4742-5956 PT Time Calculation (min) (ACUTE ONLY): 17 min  Charges:    $Therapeutic Activity: 8-22 mins PT General Charges $$ ACUTE PT VISIT: 1 Visit                     Lewis Shock, PT, DPT Acute Rehabilitation Services Secure chat preferred Office #: [phone]    Iona Hansen 10/05/2023, 1:03 PM

## 2023-10-05 NOTE — Care Management Important Message (Signed)
Important Message  Patient Details  Name: JOSEROBERTO BUNGAY MRN: [id number] Date of Birth: April 25, 1926   Important Message Given:  Yes - Medicare IM     Sherilyn Banker 10/05/2023, 2:33 PM

## 2023-10-05 NOTE — Progress Notes (Addendum)
PROGRESS NOTE    Dakota [name]  WUJ:[id number] DOB: 17-Jun-1926 DOA: 10/02/2023 PCP: [name], MD    Brief Narrative:  89 year old with history of CAD, HTN, COPD, DM 2, CHF, A-fib on Eliquis, complete heart block status post pacemaker comes to the hospital complains of fatigue and diarrhea.  Recent history of complete heart block about a month ago had a pacemaker placed, postop course complicated by hematoma treated conservatively and with Keflex.  Now is back on Eliquis.  CT scan negative for any colitis possible gastritis.  Holding off on any antibiotics at this time, overall doing better but has very poor referred and congested.  Poor oral intake as well.  PT/OT recommending SNF, daughter is interested.   Assessment & Plan:  Principal Problem:   Lactic acidosis Active Problems:   COPD, moderate (HCC)   Diabetes (HCC)   Hypercholesterolemia   Chronic systolic CHF (congestive heart failure) (HCC)   Debility   CAD (coronary artery disease) of bypass graft   Essential hypertension   Atrial fibrillation with slow ventricular response (HCC)   Diarrhea   Dehydration   Hyponatremia   Normocytic anemia    Diarrhea/fatigue Dehydration/hyponatremia - CT abdomen/pelvis questionable for mild gastritis and cystitis.  Procalcitonin is negative.  UA negative, but small colony of Klebsiella.  Overall appears to be doing better.  Continue PPI.  No evidence of leukocytosis, hold off on antibiotics.  Cough/congestion - Possible some aspiration.  Bronchodilators, I-S/flutter valve.  Scheduled bronchodilators  Hypocalcemia - Repletion  History of COPD -As needed bronchodilators  History of coronary artery disease status post CABG Congestive heart failure, EF 45% -Echocardiogram in August showed severe aortic stenosis but reviewed by cardiology during this admission.  Apparently this was an overread and overall nothing further to do at this time.  Cardiology signed off.  Appreciate  their input.  Diabetes mellitus type 2 -On metformin at home.  sliding scale and Accu-Cheks  Atrial fibrillation, chronic Symptomatic bradycardia with CHB status post pacemaker -Continue Eliquis  BPH -Supportive care  Hyperlipidemia -Statin  Chronic debility -Age-related.  Eventually we can consider PT/OT  Anemia of chronic disease -Hemoglobin around baseline of 10.6  PT/OT-SNF  DVT prophylaxis: Eliquis Code Status: Full code Family Communication: Daughter at bedside SNF placement pending   Subjective: Overall very weak Poor coughing effort and poor effort on incentive spirometer   Examination:  General exam: Appears calm and comfortable, mildly frail and cachectic.  Hard of hearing Respiratory system: Bilateral rhonchi Cardiovascular system: S1 & S2 heard, RRR. No JVD, murmurs, rubs, gallops or clicks. No pedal edema. Gastrointestinal system: Abdomen is nondistended, soft and nontender. No organomegaly or masses felt. Normal bowel sounds heard. Central nervous system: Alert and oriented. No focal neurological deficits. Extremities: Symmetric 4 x 5 power. Skin: No rashes, lesions or ulcers Psychiatry: Judgement and insight appear normal. Mood & affect appropriate.            Pressure Injury 10/03/23 Buttocks Bilateral;Right Stage 2 -  Partial thickness loss of dermis presenting as a shallow open injury with a red, pink wound bed without slough. (Active)  10/03/23 0200  Location: Buttocks  Location Orientation: Bilateral;Right  Staging: Stage 2 -  Partial thickness loss of dermis presenting as a shallow open injury with a red, pink wound bed without slough.  Wound Description (Comments):   Present on Admission: Yes     Pressure Injury 10/03/23 Heel Left Unstageable - Full thickness tissue loss in which the base of the  injury is covered by slough (yellow, tan, gray, green or brown) and/or eschar (tan, brown or black) in the wound bed. (Active)  10/03/23  0200  Location: Heel  Location Orientation: Left  Staging: Unstageable - Full thickness tissue loss in which the base of the injury is covered by slough (yellow, tan, gray, green or brown) and/or eschar (tan, brown or black) in the wound bed.  Wound Description (Comments):   Present on Admission: Yes     Pressure Injury 10/03/23 Heel Right (Active)  10/03/23 0200  Location: Heel  Location Orientation: Right  Staging:   Wound Description (Comments):   Present on Admission:      Diet Orders (From admission, onward)     Start     Ordered   10/03/23 0043  Diet Carb Modified Fluid consistency: Thin; Room service appropriate? Yes  Diet effective now       Question Answer Comment  Diet-HS Snack? Nothing   Calorie Level Medium 1600-2000   Fluid consistency: Thin   Room service appropriate? Yes      10/03/23 0042            Objective: Vitals:   10/04/23 2308 10/05/23 0354 10/05/23 0801 10/05/23 1213  BP: 107/65 (!) 108/57 (!) 142/98 110/66  Pulse: 76 68 69 80  Resp: 20 20  20   Temp: (!) 97 F (36.1 C) (!) 97.4 F (36.3 C) (!) 97.5 F (36.4 C) (!) 97.4 F (36.3 C)  TempSrc: Oral Axillary Oral Oral  SpO2: 99% 99% 99% 100%  Weight:      Height:        Intake/Output Summary (Last 24 hours) at 10/05/2023 1302 Last data filed at 10/05/2023 1237 Gross per 24 hour  Intake 358 ml  Output 1050 ml  Net -692 ml   Filed Weights   10/02/23 1231 10/03/23 0200  Weight: 45.6 kg 40.1 kg    Scheduled Meds:  apixaban  2.5 mg Oral BID   feeding supplement  237 mL Oral BID BM   ferrous sulfate  325 mg Oral Q breakfast   insulin aspart  0-9 Units Subcutaneous Q4H   ipratropium-albuterol  3 mL Nebulization BID   ketorolac  1 drop Left Eye QID   mirabegron ER  50 mg Oral QHS   multivitamin with minerals  1 tablet Oral Daily   nutrition supplement (JUVEN)  1 packet Oral BID BM   pantoprazole  40 mg Oral Daily   pravastatin  80 mg Oral QHS   prednisoLONE acetate  1 drop Left  Eye QID   predniSONE  5 mg Oral Daily   Continuous Infusions:  Nutritional status Signs/Symptoms: severe muscle depletion, moderate muscle depletion, moderate fat depletion, severe fat depletion, percent weight loss Percent weight loss: 17 % Interventions: Ensure Enlive (each supplement provides 350kcal and 20 grams of protein), MVI Body mass index is 13.85 kg/m.  Data Reviewed:   CBC: Recent Labs  Lab 10/02/23 1340 10/03/23 0044 10/03/23 0405 10/04/23 0314 10/05/23 0308  WBC 5.7  --  6.3 6.4 7.1  NEUTROABS 4.2  --   --   --   --   HGB 10.1* 9.9* 10.6* 9.0* 9.3*  HCT 32.4* 29.0* 33.6* 28.5* 29.5*  MCV 62.4*  --  61.3* 60.9* 60.5*  PLT 175  --  164 161 159   Basic Metabolic Panel: Recent Labs  Lab 10/02/23 2352 10/02/23 2354 10/03/23 0405 10/03/23 1034 10/03/23 1620 10/03/23 2232 10/04/23 0314 10/05/23 0308  NA  --    < >  136 134* 133* 132* 134* 135  K  --    < > 3.7 3.5 4.5 4.3 4.1 4.1  CL  --    < > 101 99 100 99 100 100  CO2  --    < > 26 25 22 26 27 27   GLUCOSE  --    < > 106* 163* 325* 162* 97 116*  BUN  --    < > 12 11 18 22 20  24*  CREATININE  --    < > 0.75 0.78 1.12 1.00 0.85 0.66  CALCIUM  --    < > 8.4* 8.3* 8.8* 8.6* 8.4* 8.6*  MG 1.6*  --  2.3  --   --   --  2.0 1.7  PHOS 3.5  --  3.7  --   --   --   --   --    < > = values in this interval not displayed.   GFR: Estimated Creatinine Clearance: 31.3 mL/min (by C-G formula based on SCr of 0.66 mg/dL). Liver Function Tests: Recent Labs  Lab 10/02/23 1340 10/02/23 2354 10/03/23 0405  AST 25 23 21   ALT 16 15 16   ALKPHOS 49 45 47  BILITOT 0.9 0.9 1.2*  PROT 5.8* 5.4* 5.9*  ALBUMIN 3.2* 3.0* 3.2*   No results for input(s): "LIPASE", "AMYLASE" in the last 168 hours. No results for input(s): "AMMONIA" in the last 168 hours. Coagulation Profile: No results for input(s): "INR", "PROTIME" in the last 168 hours. Cardiac Enzymes: Recent Labs  Lab 10/02/23 2354  CKTOTAL 21*   BNP (last 3  results) No results for input(s): "PROBNP" in the last 8760 hours. HbA1C: Recent Labs    10/02/23 2330  HGBA1C 6.8*   CBG: Recent Labs  Lab 10/04/23 1935 10/04/23 2306 10/05/23 0356 10/05/23 0800 10/05/23 1212  GLUCAP 131* 116* 110* 103* 188*   Lipid Profile: No results for input(s): "CHOL", "HDL", "LDLCALC", "TRIG", "CHOLHDL", "LDLDIRECT" in the last 72 hours. Thyroid Function Tests: Recent Labs    10/02/23 1359  TSH 0.880   Anemia Panel: Recent Labs    10/02/23 1359 10/02/23 2354 10/03/23 0405  VITAMINB12  --   --  3,061*  FOLATE  --   --  30.4  FERRITIN  --  216  --   TIBC  --  225*  --   IRON  --  102  --   RETICCTPCT 1.0  --   --    Sepsis Labs: Recent Labs  Lab 10/02/23 1340 10/02/23 2354 10/03/23 0402 10/04/23 0314  PROCALCITON  --  <0.10  --  <0.10  LATICACIDVEN 2.5* 0.8 1.1  --     Recent Results (from the past 240 hour(s))  Culture, blood (routine x 2)     Status: None (Preliminary result)   Collection Time: 10/02/23 12:59 PM   Specimen: BLOOD RIGHT HAND  Result Value Ref Range Status   Specimen Description BLOOD RIGHT HAND  Final   Special Requests   Final    BOTTLES DRAWN AEROBIC ONLY Blood Culture results may not be optimal due to an inadequate volume of blood received in culture bottles   Culture   Final    NO GROWTH 3 DAYS Performed at [facility], [address] [place], [place] [address]    Report Status PENDING  Incomplete  Urine Culture     Status: Abnormal   Collection Time: 10/02/23  5:36 PM   Specimen: Urine, Clean Catch  Result Value  Ref Range Status   Specimen Description URINE, CLEAN CATCH  Final   Special Requests   Final    NONE Performed at [facility], [address] [place], [place] [address]    Culture (A)  Final    10,000 COLONIES/mL KLEBSIELLA ORNITHINOLYTICA 10,000 COLONIES/mL PSEUDOMONAS AERUGINOSA    Report Status 10/05/2023 FINAL  Final   Organism ID, Bacteria KLEBSIELLA ORNITHINOLYTICA  (A)  Final   Organism ID, Bacteria PSEUDOMONAS AERUGINOSA (A)  Final      Susceptibility   Klebsiella ornithinolytica - MIC*    AMPICILLIN RESISTANT Resistant     CEFEPIME <=0.12 SENSITIVE Sensitive     CEFTRIAXONE <=0.25 SENSITIVE Sensitive     CIPROFLOXACIN <=0.25 SENSITIVE Sensitive     GENTAMICIN <=1 SENSITIVE Sensitive     IMIPENEM 0.5 SENSITIVE Sensitive     NITROFURANTOIN 32 SENSITIVE Sensitive     TRIMETH/SULFA <=20 SENSITIVE Sensitive     AMPICILLIN/SULBACTAM <=2 SENSITIVE Sensitive     PIP/TAZO <=4 SENSITIVE Sensitive ug/mL    * 10,000 COLONIES/mL KLEBSIELLA ORNITHINOLYTICA   Pseudomonas aeruginosa - MIC*    CEFTAZIDIME 4 SENSITIVE Sensitive     CIPROFLOXACIN <=0.25 SENSITIVE Sensitive     GENTAMICIN <=1 SENSITIVE Sensitive     IMIPENEM 2 SENSITIVE Sensitive     PIP/TAZO 8 SENSITIVE Sensitive ug/mL    CEFEPIME 2 SENSITIVE Sensitive     * 10,000 COLONIES/mL PSEUDOMONAS AERUGINOSA  Culture, blood (routine x 2)     Status: None (Preliminary result)   Collection Time: 10/03/23  4:00 AM   Specimen: BLOOD RIGHT ARM  Result Value Ref Range Status   Specimen Description BLOOD RIGHT ARM  Final   Special Requests   Final    BOTTLES DRAWN AEROBIC ONLY Blood Culture results may not be optimal due to an inadequate volume of blood received in culture bottles   Culture   Final    NO GROWTH 2 DAYS Performed at [facility], [address], [place], [place] [address]    Report Status PENDING  Incomplete         Radiology Studies: No results found.         LOS: 2 days   Time spent= 35 mins    Miguel Rota, MD [facility]  If 7PM-7AM, please contact night-coverage  10/05/2023, 1:02 PM

## 2023-10-05 NOTE — Plan of Care (Signed)
  Problem: Nutritional: Goal: Maintenance of adequate nutrition will improve Outcome: Progressing   Problem: Nutritional: Goal: Progress toward achieving an optimal weight will improve Outcome: Progressing   Problem: Skin Integrity: Goal: Risk for impaired skin integrity will decrease Outcome: Progressing   Problem: Tissue Perfusion: Goal: Adequacy of tissue perfusion will improve Outcome: Progressing

## 2023-10-06 DIAGNOSIS — E872 Acidosis, unspecified: Secondary | ICD-10-CM | POA: Diagnosis not present

## 2023-10-06 LAB — BASIC METABOLIC PANEL
Anion gap: 11 (ref 5–15)
BUN: 29 mg/dL — ABNORMAL HIGH (ref 8–23)
CO2: 25 mmol/L (ref 22–32)
Calcium: 8.4 mg/dL — ABNORMAL LOW (ref 8.9–10.3)
Chloride: 97 mmol/L — ABNORMAL LOW (ref 98–111)
Creatinine, Ser: 0.77 mg/dL (ref 0.61–1.24)
GFR, Estimated: >60 mL/min (ref >60)
Glucose, Bld: 233 mg/dL — ABNORMAL HIGH (ref 70–99)
Potassium: 4 mmol/L (ref 3.5–5.1)
Sodium: 133 mmol/L — ABNORMAL LOW (ref 135–145)

## 2023-10-06 LAB — GLUCOSE, CAPILLARY
Glucose-Capillary: 118 mg/dL — ABNORMAL HIGH (ref 70–99)
Glucose-Capillary: 127 mg/dL — ABNORMAL HIGH (ref 70–99)
Glucose-Capillary: 168 mg/dL — ABNORMAL HIGH (ref 70–99)
Glucose-Capillary: 174 mg/dL — ABNORMAL HIGH (ref 70–99)
Glucose-Capillary: 189 mg/dL — ABNORMAL HIGH (ref 70–99)
Glucose-Capillary: 221 mg/dL — ABNORMAL HIGH (ref 70–99)
Glucose-Capillary: 223 mg/dL — ABNORMAL HIGH (ref 70–99)

## 2023-10-06 LAB — CBC
HCT: 29.6 % — ABNORMAL LOW (ref 39.0–52.0)
Hemoglobin: 9.6 g/dL — ABNORMAL LOW (ref 13.0–17.0)
MCH: 19.6 pg — ABNORMAL LOW (ref 26.0–34.0)
MCHC: 32.4 g/dL (ref 30.0–36.0)
MCV: 60.3 fL — ABNORMAL LOW (ref 80.0–100.0)
Platelets: 164 10*3/uL (ref 150–400)
RBC: 4.91 MIL/uL (ref 4.22–5.81)
RDW: 15.6 % — ABNORMAL HIGH (ref 11.5–15.5)
WBC: 7.6 10*3/uL (ref 4.0–10.5)
nRBC: 0 % (ref 0.0–0.2)

## 2023-10-06 LAB — MAGNESIUM: Magnesium: 1.7 mg/dL (ref 1.7–2.4)

## 2023-10-06 MED ORDER — MIDODRINE HCL 5 MG PO TABS
2.5000 mg | ORAL_TABLET | Freq: Three times a day (TID) | ORAL | Status: DC
  Administered 2023-10-06: 2.5 mg via ORAL
  Filled 2023-10-06: qty 1

## 2023-10-06 NOTE — Progress Notes (Signed)
Got a call from CCMD, mentioned about his HR only 38b/min, upon checking he was sleeping and his HR was fluctuating, pt was alert and oriented *4 ,  10/06/23 1329  Vitals  BP (!) 78/52  MAP (mmHg) (!) 58  BP Location Right Arm  BP Method Automatic  Patient Position (if appropriate) Lying  Pulse Rate (!) 45  Pulse Rate Source Monitor  ECG Heart Rate (!) 53  Level of Consciousness  Level of Consciousness Alert   notified the MD via secure chat about his HR and blood pressure.

## 2023-10-06 NOTE — TOC Progression Note (Signed)
Transition of Care [facility]) - Progression Note    Patient Details  Name: Dakota [name] MRN: [id number] Date of Birth: 07/12/1926  Transition of Care [facility]) CM/SW Contact  Eduard Roux, [place] Phone Number: 10/06/2023, 3:10 PM  Clinical Narrative:     Received authorization for SNF- 12/11 - 12/13 NRD 12/13 [facility] [id number]   [facility] can admit to tomorrow   Antony Blackbird, MSW, LCSW Clinical Social Worker    Expected Discharge Plan: Skilled Nursing Facility Barriers to Discharge: Continued Medical Work up, English as a second language teacher, SNF Pending bed offer  Expected Discharge Plan and Services In-house Referral: Clinical Social Work                                             Social Determinants of Health (SDOH) Interventions SDOH Screenings   Food Insecurity: No Food Insecurity (10/03/2023)  Housing: Low Risk  (10/03/2023)  Transportation Needs: No Transportation Needs (10/03/2023)  Utilities: Not At Risk (10/03/2023)  Tobacco Use: Low Risk  (10/03/2023)    Readmission Risk Interventions    09/02/2023    1:43 PM  Readmission Risk Prevention Plan  Transportation Screening Complete  Home Care Screening Complete  Medication Review (RN CM) Complete

## 2023-10-06 NOTE — TOC Progression Note (Signed)
Transition of Care [facility]) - Progression Note    Patient Details  Name: Dakota [name] MRN: [id number] Date of Birth: 11-12-1925  [facility] Care [facility]) CM/SW Contact  Eduard Roux, [place] Phone Number: 10/06/2023, 10:01 AM  Clinical Narrative:     Spoke with patient's daughter, her SNF choice is [facility].  Contacted [facility], waiting on response   Antony Blackbird, MSW, LCSW Clinical Social Worker    Expected Discharge Plan: Skilled Nursing Facility Barriers to Discharge: Continued Medical Work up, English as a second language teacher, SNF Pending bed offer  Expected Discharge Plan and Services In-house Referral: Clinical Social Work                                             Social Determinants of Health (SDOH) Interventions SDOH Screenings   Food Insecurity: No Food Insecurity (10/03/2023)  Housing: Low Risk  (10/03/2023)  Transportation Needs: No Transportation Needs (10/03/2023)  Utilities: Not At Risk (10/03/2023)  Tobacco Use: Low Risk  (10/03/2023)    Readmission Risk Interventions    09/02/2023    1:43 PM  Readmission Risk Prevention Plan  Transportation Screening Complete  Home Care Screening Complete  Medication Review (RN CM) Complete

## 2023-10-06 NOTE — Progress Notes (Signed)
PROGRESS NOTE    Dakota [name]  ZOX:[id number] DOB: June 10, 1926 DOA: 10/02/2023 PCP: [name], MD    Brief Narrative:  89 year old with history of CAD, HTN, COPD, DM 2, CHF, A-fib on Eliquis, complete heart block status post pacemaker comes to the hospital complains of fatigue and diarrhea.  Recent history of complete heart block about a month ago had a pacemaker placed, postop course complicated by hematoma treated conservatively and with Keflex.  Now is back on Eliquis.  CT scan negative for any colitis possible gastritis.   Poor oral intake as well.  PT/OT recommending SNF, daughter is interested.   Assessment & Plan:  Principal Problem:   Lactic acidosis Active Problems:   COPD, moderate (HCC)   Diabetes (HCC)   Hypercholesterolemia   Chronic systolic CHF (congestive heart failure) (HCC)   Debility   CAD (coronary artery disease) of bypass graft   Essential hypertension   Atrial fibrillation with slow ventricular response (HCC)   Diarrhea   Dehydration   Hyponatremia   Normocytic anemia    Diarrhea/fatigue Dehydration/hyponatremia - CT abdomen/pelvis questionable for mild gastritis and cystitis.  Procalcitonin is negative.  UA negative, but small colony of Klebsiella.  Overall appears to be doing better.  Continue PPI.  No evidence of leukocytosis, hold off on antibiotics.  Cough/congestion - Possible some aspiration.  Bronchodilators, I-S/flutter valve.  Scheduled bronchodilators  Hypocalcemia - Repleted  History of COPD -As needed bronchodilators  History of coronary artery disease status post CABG Congestive heart failure, EF 45% -Echocardiogram in August showed severe aortic stenosis but reviewed by cardiology during this admission.  Apparently this was an overread and overall nothing further to do at this time.  Cardiology signed off.  Appreciate their input.  Diabetes mellitus type 2 -On metformin at home.  sliding scale and Accu-Cheks  Atrial  fibrillation, chronic Symptomatic bradycardia with CHB status post pacemaker -Continue Eliquis  BPH -Supportive care  Hyperlipidemia -Statin  Chronic debility -Age-related.  Eventually we can consider PT/OT  Anemia of chronic disease -Hemoglobin around baseline of 10.6  PT/OT-SNF  Would recommend continued palliative care conversations   DVT prophylaxis: Eliquis Code Status: Full code Family Communication: Daughter at bedside SNF placement pending   Subjective: No overnight events-- daughter picking out SNF   Examination:   General: Appearance:    Thin frail appearing male in no acute distress     Lungs:      respirations unlabored  Heart:    Normal heart rate.   MS:   All extremities are intact.   Neurologic:   Awake, alert               Pressure Injury 10/03/23 Buttocks Bilateral;Right Stage 2 -  Partial thickness loss of dermis presenting as a shallow open injury with a red, pink wound bed without slough. (Active)  10/03/23 0200  Location: Buttocks  Location Orientation: Bilateral;Right  Staging: Stage 2 -  Partial thickness loss of dermis presenting as a shallow open injury with a red, pink wound bed without slough.  Wound Description (Comments):   Present on Admission: Yes     Pressure Injury 10/03/23 Heel Left Unstageable - Full thickness tissue loss in which the base of the injury is covered by slough (yellow, tan, gray, green or brown) and/or eschar (tan, brown or black) in the wound bed. (Active)  10/03/23 0200  Location: Heel  Location Orientation: Left  Staging: Unstageable - Full thickness tissue loss in which the base of the  injury is covered by slough (yellow, tan, gray, green or brown) and/or eschar (tan, brown or black) in the wound bed.  Wound Description (Comments):   Present on Admission: Yes     Pressure Injury 10/03/23 Heel Right (Active)  10/03/23 0200  Location: Heel  Location Orientation: Right  Staging:   Wound  Description (Comments):   Present on Admission:      Pressure Injury 10/06/23 Vertebral column Lower Stage 2 -  Partial thickness loss of dermis presenting as a shallow open injury with a red, pink wound bed without slough. area of broken skin on lower back (Active)  10/06/23 1212  Location: Vertebral column  Location Orientation: Lower  Staging: Stage 2 -  Partial thickness loss of dermis presenting as a shallow open injury with a red, pink wound bed without slough.  Wound Description (Comments): area of broken skin on lower back  Present on Admission: Yes     Pressure Injury 10/06/23 Vertebral column Mid Stage 2 -  Partial thickness loss of dermis presenting as a shallow open injury with a red, pink wound bed without slough. round area of broken skin on mid back (Active)  10/06/23 1213  Location: Vertebral column  Location Orientation: Mid  Staging: Stage 2 -  Partial thickness loss of dermis presenting as a shallow open injury with a red, pink wound bed without slough.  Wound Description (Comments): round area of broken skin on mid back  Present on Admission: Yes     Diet Orders (From admission, onward)     Start     Ordered   10/03/23 0043  Diet Carb Modified Fluid consistency: Thin; Room service appropriate? Yes  Diet effective now       Question Answer Comment  Diet-HS Snack? Nothing   Calorie Level Medium 1600-2000   Fluid consistency: Thin   Room service appropriate? Yes      10/03/23 0042            Objective: Vitals:   10/06/23 0404 10/06/23 0823 10/06/23 0853 10/06/23 1133  BP: (!) 99/57 125/73  99/71  Pulse: 81 73  80  Resp: 20 18  12   Temp: 97.9 F (36.6 C) 97.9 F (36.6 C)  97.8 F (36.6 C)  TempSrc: Oral Oral  Oral  SpO2: 98% 100% 96% 99%  Weight:      Height:        Intake/Output Summary (Last 24 hours) at 10/06/2023 1223 Last data filed at 10/06/2023 1610 Gross per 24 hour  Intake 168 ml  Output 700 ml  Net -532 ml   Filed Weights    10/02/23 1231 10/03/23 0200  Weight: 45.6 kg 40.1 kg    Scheduled Meds:  apixaban  2.5 mg Oral BID   feeding supplement  237 mL Oral BID BM   ferrous sulfate  325 mg Oral Q breakfast   insulin aspart  0-9 Units Subcutaneous Q4H   ipratropium-albuterol  3 mL Nebulization BID   ketorolac  1 drop Left Eye QID   mirabegron ER  50 mg Oral QHS   multivitamin with minerals  1 tablet Oral Daily   nutrition supplement (JUVEN)  1 packet Oral BID BM   pantoprazole  40 mg Oral Daily   pravastatin  80 mg Oral QHS   prednisoLONE acetate  1 drop Left Eye QID   predniSONE  5 mg Oral Daily   Continuous Infusions:  Nutritional status Signs/Symptoms: severe muscle depletion, moderate muscle depletion, moderate fat depletion, severe fat  depletion, percent weight loss Percent weight loss: 17 % Interventions: Ensure Enlive (each supplement provides 350kcal and 20 grams of protein), MVI Body mass index is 13.85 kg/m.  Data Reviewed:   CBC: Recent Labs  Lab 10/02/23 1340 10/03/23 0044 10/03/23 0405 10/04/23 0314 10/05/23 0308 10/06/23 0319  WBC 5.7  --  6.3 6.4 7.1 7.6  NEUTROABS 4.2  --   --   --   --   --   HGB 10.1* 9.9* 10.6* 9.0* 9.3* 9.6*  HCT 32.4* 29.0* 33.6* 28.5* 29.5* 29.6*  MCV 62.4*  --  61.3* 60.9* 60.5* 60.3*  PLT 175  --  164 161 159 164   Basic Metabolic Panel: Recent Labs  Lab 10/02/23 2352 10/02/23 2354 10/03/23 0405 10/03/23 1034 10/03/23 1620 10/03/23 2232 10/04/23 0314 10/05/23 0308 10/06/23 0319  NA  --    < > 136   < > 133* 132* 134* 135 133*  K  --    < > 3.7   < > 4.5 4.3 4.1 4.1 4.0  CL  --    < > 101   < > 100 99 100 100 97*  CO2  --    < > 26   < > 22 26 27 27 25   GLUCOSE  --    < > 106*   < > 325* 162* 97 116* 233*  BUN  --    < > 12   < > 18 22 20  24* 29*  CREATININE  --    < > 0.75   < > 1.12 1.00 0.85 0.66 0.77  CALCIUM  --    < > 8.4*   < > 8.8* 8.6* 8.4* 8.6* 8.4*  MG 1.6*  --  2.3  --   --   --  2.0 1.7 1.7  PHOS 3.5  --  3.7  --   --    --   --   --   --    < > = values in this interval not displayed.   GFR: Estimated Creatinine Clearance: 31.3 mL/min (by C-G formula based on SCr of 0.77 mg/dL). Liver Function Tests: Recent Labs  Lab 10/02/23 1340 10/02/23 2354 10/03/23 0405  AST 25 23 21   ALT 16 15 16   ALKPHOS 49 45 47  BILITOT 0.9 0.9 1.2*  PROT 5.8* 5.4* 5.9*  ALBUMIN 3.2* 3.0* 3.2*   No results for input(s): "LIPASE", "AMYLASE" in the last 168 hours. No results for input(s): "AMMONIA" in the last 168 hours. Coagulation Profile: No results for input(s): "INR", "PROTIME" in the last 168 hours. Cardiac Enzymes: Recent Labs  Lab 10/02/23 2354  CKTOTAL 21*   BNP (last 3 results) No results for input(s): "PROBNP" in the last 8760 hours. HbA1C: No results for input(s): "HGBA1C" in the last 72 hours.  CBG: Recent Labs  Lab 10/05/23 2016 10/06/23 0000 10/06/23 0402 10/06/23 0849 10/06/23 1135  GLUCAP 175* 127* 221* 189* 168*   Lipid Profile: No results for input(s): "CHOL", "HDL", "LDLCALC", "TRIG", "CHOLHDL", "LDLDIRECT" in the last 72 hours. Thyroid Function Tests: No results for input(s): "TSH", "T4TOTAL", "FREET4", "T3FREE", "THYROIDAB" in the last 72 hours.  Anemia Panel: No results for input(s): "VITAMINB12", "FOLATE", "FERRITIN", "TIBC", "IRON", "RETICCTPCT" in the last 72 hours.  Sepsis Labs: Recent Labs  Lab 10/02/23 1340 10/02/23 2354 10/03/23 0402 10/04/23 0314  PROCALCITON  --  <0.10  --  <0.10  LATICACIDVEN 2.5* 0.8 1.1  --     Recent Results (from the  past 240 hour(s))  Culture, blood (routine x 2)     Status: None (Preliminary result)   Collection Time: 10/02/23 12:59 PM   Specimen: BLOOD RIGHT HAND  Result Value Ref Range Status   Specimen Description BLOOD RIGHT HAND  Final   Special Requests   Final    BOTTLES DRAWN AEROBIC ONLY Blood Culture results may not be optimal due to an inadequate volume of blood received in culture bottles   Culture   Final    NO GROWTH 4  DAYS Performed at [facility], [address] [place], [place] [address]    Report Status PENDING  Incomplete  Urine Culture     Status: Abnormal   Collection Time: 10/02/23  5:36 PM   Specimen: Urine, Clean Catch  Result Value Ref Range Status   Specimen Description URINE, CLEAN CATCH  Final   Special Requests   Final    NONE Performed at [facility], [address], [place], [place] [address]    Culture (A)  Final    10,000 COLONIES/mL KLEBSIELLA ORNITHINOLYTICA 10,000 COLONIES/mL PSEUDOMONAS AERUGINOSA    Report Status 10/05/2023 FINAL  Final   Organism ID, Bacteria KLEBSIELLA ORNITHINOLYTICA (A)  Final   Organism ID, Bacteria PSEUDOMONAS AERUGINOSA (A)  Final      Susceptibility   Klebsiella ornithinolytica - MIC*    AMPICILLIN RESISTANT Resistant     CEFEPIME <=0.12 SENSITIVE Sensitive     CEFTRIAXONE <=0.25 SENSITIVE Sensitive     CIPROFLOXACIN <=0.25 SENSITIVE Sensitive     GENTAMICIN <=1 SENSITIVE Sensitive     IMIPENEM 0.5 SENSITIVE Sensitive     NITROFURANTOIN 32 SENSITIVE Sensitive     TRIMETH/SULFA <=20 SENSITIVE Sensitive     AMPICILLIN/SULBACTAM <=2 SENSITIVE Sensitive     PIP/TAZO <=4 SENSITIVE Sensitive ug/mL    * 10,000 COLONIES/mL KLEBSIELLA ORNITHINOLYTICA   Pseudomonas aeruginosa - MIC*    CEFTAZIDIME 4 SENSITIVE Sensitive     CIPROFLOXACIN <=0.25 SENSITIVE Sensitive     GENTAMICIN <=1 SENSITIVE Sensitive     IMIPENEM 2 SENSITIVE Sensitive     PIP/TAZO 8 SENSITIVE Sensitive ug/mL    CEFEPIME 2 SENSITIVE Sensitive     * 10,000 COLONIES/mL PSEUDOMONAS AERUGINOSA  Culture, blood (routine x 2)     Status: None (Preliminary result)   Collection Time: 10/03/23  4:00 AM   Specimen: BLOOD RIGHT ARM  Result Value Ref Range Status   Specimen Description BLOOD RIGHT ARM  Final   Special Requests   Final    BOTTLES DRAWN AEROBIC ONLY Blood Culture results may not be optimal due to an inadequate volume of blood received in culture bottles    Culture   Final    NO GROWTH 3 DAYS Performed at [facility], [address] [place], [place] [address]    Report Status PENDING  Incomplete         Radiology Studies: No results found.         LOS: 3 days   Time spent= 35 mins    Joseph Art, DO [facility]  If 7PM-7AM, please contact night-coverage  10/06/2023, 12:23 PM

## 2023-10-06 NOTE — Plan of Care (Signed)
  Problem: Nutritional: Goal: Maintenance of adequate nutrition will improve Outcome: Progressing   Problem: Nutritional: Goal: Progress toward achieving an optimal weight will improve Outcome: Progressing   Problem: Skin Integrity: Goal: Risk for impaired skin integrity will decrease Outcome: Progressing   Problem: Tissue Perfusion: Goal: Adequacy of tissue perfusion will improve Outcome: Progressing

## 2023-10-06 NOTE — Progress Notes (Signed)
Speech Language Pathology Treatment: Dysphagia  Patient Details Name: Dakota [name] MRN: [id number] DOB: Feb 27, 1926 Today's Date: 10/06/2023 Time: 4098-1191 SLP Time Calculation (min) (ACUTE ONLY): 15 min  Assessment / Plan / Recommendation Clinical Impression  Patient seen by SLP for skilled treatment focused on dysphagia goals. His daughter was present in the room as well. When patient asked about his PO intake he stated, "eating and drinking is very low" and daughter did confirm that his PO intake prior to hospitalization was limited but that here at the hospital, some foods he is getting are difficult for him to chew. Daughter asked about ideal positioning for PO intake and that at home, she would get him into his wheelchair to eat. SLP confirmed that sitting upright as much as possible would be ideal for PO intake but as patient also has back pain, recommendation is for him to be as upright as tolerated. SLP observed patient with straw sips of Ensure shake with timely swallow initiation and no overt s/s aspiration. SLP recommending f/u as needed at SNF for continued assistance with PO toleration/least restrictive oral diet and family education. No further acute level skilled SLP needs at this time.   HPI HPI: Dakota [name] is a 89 y.o. male with medical history significant of CAD, HTN, COPD, DM2, CHF, a.fib on eliquis, complete heart block status post pacemaker.  Presented with diarrhea and fatigue/  ent history significant for admission for complete heart block on 3 November requiring pacemaker placed on the fifth.  Ptient postoperative course complicated by hematoma for which she was treated with Keflex for 1 week.  Now back on Eliquis.  He developed severe diarrhea and has became more fatigued.  No associated fevers.  CT of the abdomen/pelvis showing in the lower chest no acute findings.  Chest xray showing no acute cardiopulmonary issues. Patient is from home with his daughter.  They are now  agreeable to short term rehab stay.      SLP Plan  Discharge SLP treatment due to (comment);All goals met      Recommendations for follow up therapy are one component of a multi-disciplinary discharge planning process, led by the attending physician.  Recommendations may be updated based on patient status, additional functional criteria and insurance authorization.    Recommendations  Diet recommendations: Regular;Thin liquid Liquids provided via: Cup;Straw Medication Administration: Whole meds with liquid Supervision: Full supervision/cueing for compensatory strategies;Trained caregiver to feed patient;Staff to assist with self feeding Compensations: Slow rate;Small sips/bites Postural Changes and/or Swallow Maneuvers: Seated upright 90 degrees                  Oral care BID   Frequent or constant Supervision/Assistance Dysphagia, unspecified (R13.10)     Discharge SLP treatment due to (comment);All goals met     Angela Nevin, MA, CCC-SLP Speech Therapy

## 2023-10-06 NOTE — Plan of Care (Signed)
  Problem: Coping: Goal: Ability to adjust to condition or change in health will improve Outcome: Progressing   Problem: Education: Goal: Individualized Educational Video(s) Outcome: Progressing   Problem: Fluid Volume: Goal: Ability to maintain a balanced intake and output will improve Outcome: Progressing   Problem: Health Behavior/Discharge Planning: Goal: Ability to identify and utilize available resources and services will improve Outcome: Progressing   Problem: Nutritional: Goal: Maintenance of adequate nutrition will improve Outcome: Progressing   Problem: Tissue Perfusion: Goal: Adequacy of tissue perfusion will improve Outcome: Progressing   Problem: Skin Integrity: Goal: Risk for impaired skin integrity will decrease Outcome: Progressing   Problem: Tissue Perfusion: Goal: Adequacy of tissue perfusion will improve Outcome: Progressing

## 2023-10-06 NOTE — Progress Notes (Signed)
Patient is alert and oriented *4, had good oral intake of his meals today, continue cardiac monitoring, denies any pain now, repositioning done 2 hrly, call bell in reach, family member in bed side.

## 2023-10-06 NOTE — Progress Notes (Signed)
Occupational Therapy Treatment Patient Details Name: RATHA HARTSEL MRN: [id number] DOB: February 12, 1926 Today's Date: 10/06/2023   History of present illness Pt is a 89 y.o. male admitted 10/02/23 with diarrhea, fatigue. Workup for dehydration, hyponatremia. PMH includes CHB (s/p PPM 08/31/23), CAD (s/p CABG), HTN, COPD, DM2, CHF, afib on Eliquis, chronic hernia.   OT comments  Pt appears very fatigued, effortful to speak, able to follow commands with increased time fairly consistently. Pt daughter present during session and very supportive, she is in w/c with injured L foot, but was assisting with feeding and holding RW for support. Pt mod A for supine to sit on EOB, L lateral lean limits safety sitting at EOB, but able to maintain sitting balance for a few minutes. Pt mod A for STS and pivot transfer, strong L lateral lean with standing, unable to spread legs to improve base of support. Pt requires therapist assistance to maintain balance and for controlling sitting. Pt would benefit from postacute rehab <3hrs/day, will be seen acutely to progress as able.       If plan is discharge home, recommend the following:  Two people to help with walking and/or transfers;Two people to help with bathing/dressing/bathroom;Assistance with cooking/housework;Assistance with feeding;Direct supervision/assist for medications management;Direct supervision/assist for financial management;Assist for transportation;Help with stairs or ramp for entrance   Equipment Recommendations  None recommended by OT    Recommendations for Other Services      Precautions / Restrictions Precautions Precautions: Fall Precaution Comments: Pacemaker placement 08/31/23 Restrictions Weight Bearing Restrictions: No       Mobility Bed Mobility Overal bed mobility: Needs Assistance Bed Mobility: Supine to Sit     Supine to sit: Mod assist     General bed mobility comments: HOB elevated, with verbal cues pt able to slowly move  LEs to EOB, HOB elevated, modA for trunk elevation and to scoot to EOB, pt with L lateral lean but able to maintain sitting position    Transfers Overall transfer level: Needs assistance Equipment used: Rolling walker (2 wheels) Transfers: Sit to/from Stand, Bed to chair/wheelchair/BSC Sit to Stand: Mod assist     Step pivot transfers: Mod assist, Max assist     General transfer comment: mod A to power STS, maintaining balance, significant L lateral lean which would result in fall without mod/max A support.     Balance Overall balance assessment: Needs assistance Sitting-balance support: No upper extremity supported, Single extremity supported Sitting balance-Leahy Scale: Poor Sitting balance - Comments: reliant on bilat UE support, strong L lateral lean, varied support from contact guard for short period of times to modA   Standing balance support: Bilateral upper extremity supported, Reliant on assistive device for balance Standing balance-Leahy Scale: Poor Standing balance comment: reliant on RW and therapist support, L lateral lean causing significant LOB, difficulty sitting without assist.                           ADL either performed or assessed with clinical judgement   ADL Overall ADL's : Needs assistance/impaired Eating/Feeding: Minimal assistance;Sitting   Grooming: Minimal assistance;Sitting;Cueing for sequencing               Lower Body Dressing: Maximal assistance;+2 for safety/equipment;Cueing for sequencing;Cueing for compensatory techniques;Cueing for safety;Sit to/from stand;Sitting/lateral leans   Toilet Transfer: Moderate assistance;BSC/3in1;Rolling walker (2 wheels)   Toileting- Clothing Manipulation and Hygiene: Maximal assistance;Sit to/from stand       Functional mobility during ADLs: Moderate  assistance;Rolling walker (2 wheels)      Extremity/Trunk Assessment Upper Extremity Assessment Upper Extremity Assessment: Generalized  weakness RUE Deficits / Details: Impaired shoulder flexion at baseline; generalized weakness LUE Deficits / Details: Impaired shoulder flexion at baseline; generalized weakness            Vision       Perception     Praxis      Cognition Arousal: Alert Behavior During Therapy: Flat affect Overall Cognitive Status: Difficult to assess                                 General Comments: pt HOH, dtr to bring in hearing aides. Able to follow commands, appreciative of help        Exercises      Shoulder Instructions       General Comments      Pertinent Vitals/ Pain       Pain Assessment Pain Assessment: No/denies pain  Home Living                                          Prior Functioning/Environment              Frequency  Min 1X/week        Progress Toward Goals  OT Goals(current goals can now be found in the care plan section)  Progress towards OT goals: Progressing toward goals  Acute Rehab OT Goals Patient Stated Goal: to improve strength OT Goal Formulation: With patient/family Time For Goal Achievement: 10/17/23 Potential to Achieve Goals: Good ADL Goals Pt Will Perform Grooming: sitting;with supervision Pt Will Perform Upper Body Bathing: with contact guard assist;sitting Pt Will Perform Lower Body Bathing: sitting/lateral leans;sit to/from stand;with mod assist Pt Will Perform Lower Body Dressing: with mod assist;sitting/lateral leans;sit to/from stand Pt Will Transfer to Toilet: with contact guard assist;ambulating;bedside commode Pt Will Perform Toileting - Clothing Manipulation and hygiene: sit to/from stand;sitting/lateral leans;with mod assist  Plan      Co-evaluation                 AM-PAC OT "6 Clicks" Daily Activity     Outcome Measure   Help from another person eating meals?: A Little Help from another person taking care of personal grooming?: A Little Help from another person  toileting, which includes using toliet, bedpan, or urinal?: Total Help from another person bathing (including washing, rinsing, drying)?: A Lot Help from another person to put on and taking off regular upper body clothing?: A Little Help from another person to put on and taking off regular lower body clothing?: A Lot 6 Click Score: 14    End of Session Equipment Utilized During Treatment: Gait belt;Rolling walker (2 wheels)  OT Visit Diagnosis: Unsteadiness on feet (R26.81);Other abnormalities of gait and mobility (R26.89);History of falling (Z91.81);Muscle weakness (generalized) (M62.81);Other (comment)   Activity Tolerance Patient tolerated treatment well   Patient Left in chair;with call bell/phone within reach;with chair alarm set;with family/visitor present   Nurse Communication Mobility status        Time: 9147-8295 OT Time Calculation (min): 33 min  Charges: OT General Charges $OT Visit: 1 Visit OT Treatments $Self Care/Home Management : 8-22 mins $Therapeutic Activity: 8-22 mins  Leisha Trinkle, OTR/L   Aarion Metzgar R Nyajah Hyson 10/06/2023, 1:26 PM

## 2023-10-07 DIAGNOSIS — E872 Acidosis, unspecified: Secondary | ICD-10-CM | POA: Diagnosis not present

## 2023-10-07 LAB — BASIC METABOLIC PANEL
Anion gap: 6 (ref 5–15)
BUN: 33 mg/dL — ABNORMAL HIGH (ref 8–23)
CO2: 27 mmol/L (ref 22–32)
Calcium: 8.5 mg/dL — ABNORMAL LOW (ref 8.9–10.3)
Chloride: 101 mmol/L (ref 98–111)
Creatinine, Ser: 0.67 mg/dL (ref 0.61–1.24)
GFR, Estimated: >60 mL/min (ref >60)
Glucose, Bld: 121 mg/dL — ABNORMAL HIGH (ref 70–99)
Potassium: 3.9 mmol/L (ref 3.5–5.1)
Sodium: 134 mmol/L — ABNORMAL LOW (ref 135–145)

## 2023-10-07 LAB — CULTURE, BLOOD (ROUTINE X 2): Culture: NO GROWTH

## 2023-10-07 LAB — GLUCOSE, CAPILLARY
Glucose-Capillary: 116 mg/dL — ABNORMAL HIGH (ref 70–99)
Glucose-Capillary: 118 mg/dL — ABNORMAL HIGH (ref 70–99)
Glucose-Capillary: 153 mg/dL — ABNORMAL HIGH (ref 70–99)
Glucose-Capillary: 172 mg/dL — ABNORMAL HIGH (ref 70–99)
Glucose-Capillary: 200 mg/dL — ABNORMAL HIGH (ref 70–99)
Glucose-Capillary: 154 mg/dL — ABNORMAL HIGH (ref 70–99)
Glucose-Capillary: 184 mg/dL — ABNORMAL HIGH (ref 70–99)

## 2023-10-07 LAB — CBC
HCT: 27.3 % — ABNORMAL LOW (ref 39.0–52.0)
Hemoglobin: 8.7 g/dL — ABNORMAL LOW (ref 13.0–17.0)
MCH: 19.3 pg — ABNORMAL LOW (ref 26.0–34.0)
MCHC: 31.9 g/dL (ref 30.0–36.0)
MCV: 60.7 fL — ABNORMAL LOW (ref 80.0–100.0)
Platelets: 155 10*3/uL (ref 150–400)
RBC: 4.5 MIL/uL (ref 4.22–5.81)
RDW: 15.6 % — ABNORMAL HIGH (ref 11.5–15.5)
WBC: 5.1 10*3/uL (ref 4.0–10.5)
nRBC: 0 % (ref 0.0–0.2)

## 2023-10-07 LAB — MAGNESIUM: Magnesium: 1.8 mg/dL (ref 1.7–2.4)

## 2023-10-07 MED ORDER — SODIUM CHLORIDE 0.9 % IV SOLN: INTRAVENOUS | Status: DC

## 2023-10-07 MED ORDER — MIDODRINE HCL 5 MG PO TABS
10.0000 mg | ORAL_TABLET | Freq: Three times a day (TID) | ORAL | Status: DC
  Filled 2023-10-07: qty 2

## 2023-10-07 MED ORDER — MIDODRINE HCL 5 MG PO TABS
5.0000 mg | ORAL_TABLET | Freq: Three times a day (TID) | ORAL | Status: DC
  Administered 2023-10-07 (×3): 5 mg via ORAL
  Filled 2023-10-07 (×3): qty 1

## 2023-10-07 NOTE — Progress Notes (Signed)
Physical Therapy Treatment Patient Details Name: Dakota [name] MRN: [id number] DOB: September 30, 1926 Today's Date: 10/07/2023   History of Present Illness 89 y.o. male admitted 10/02/23 with diarrhea, fatigue, dehydration, hyponatremia. PMHx: CHB (s/p PPM 08/31/23), CAD (s/p CABG), HTN, COPD, DM2, CHF, Afib on Eliquis, chronic hernia.    PT Comments  Pt pleasant and agreeable to mobility. Pt required maxA with bed mobility, was able to initiate movements slowly but had difficulty elevating trunk and using UE for assist. Pt required less assist for STS and step-pivot transfers, minA for transfers assist to maintain stability and safety. Pt tolerated 28ft of ambulation in room, declined further ambulation trials due to fatigue.     If plan is discharge home, recommend the following: A lot of help with walking and/or transfers;A lot of help with bathing/dressing/bathroom;Assistance with cooking/housework;Direct supervision/assist for medications management;Direct supervision/assist for financial management;Assist for transportation;Help with stairs or ramp for entrance   Can travel by private vehicle     No  Equipment Recommendations  None recommended by PT    Recommendations for Other Services       Precautions / Restrictions Precautions Precautions: Fall Restrictions Weight Bearing Restrictions Per Provider Order: No     Mobility  Bed Mobility Overal bed mobility: Needs Assistance Bed Mobility: Supine to Sit     Supine to sit: Max assist     General bed mobility comments: pt able to initiate movement of LE to EOB, need assist to fully bring them, pt required max A to lift trunk and scoot to EOB, was liekley limited some by mattress inflation causing large side walls    Transfers Overall transfer level: Needs assistance Equipment used: Rolling walker (2 wheels), None Transfers: Sit to/from Stand, Bed to chair/wheelchair/BSC Sit to Stand: Min assist, +2 safety/equipment   Step pivot  transfers: Min assist, +2 safety/equipment       General transfer comment: trialed use of Stedy, pt reported the bar was too far away to use and did not want to continue trial. STS x2 minA +2, pt powered up by self from EOB and recliner, assist to maintain balance and guie forward    Ambulation/Gait Ambulation/Gait assistance: Min assist, +2 safety/equipment Gait Distance (Feet): 5 Feet Assistive device: Rolling walker (2 wheels) Gait Pattern/deviations: Step-to pattern, Decreased step length - right, Decreased step length - left, Decreased stride length, Knee flexed in stance - right, Knee flexed in stance - left   Gait velocity interpretation: <1.31 ft/sec, indicative of household ambulator   General Gait Details: ambulated 53ft with short steps in room, minA to maintain balance within bounds of RW, pt exhibited postorior lean, advanced feet and RW by own power, close chair follow   Stairs             Wheelchair Mobility     Tilt Bed    Modified Rankin (Stroke Patients Only)       Balance Overall balance assessment: Needs assistance Sitting-balance support: No upper extremity supported, Single extremity supported Sitting balance-Leahy Scale: Poor Sitting balance - Comments: use of UE support, strong L lateral lean, intermittent use of CGA was initially maxA for static balance Postural control: Left lateral lean Standing balance support: Bilateral upper extremity supported, Reliant on assistive device for balance Standing balance-Leahy Scale: Poor Standing balance comment: reliant on RW and therapist support, L lateral lean, posterior bias in static stance  Cognition Arousal: Alert Behavior During Therapy: Flat affect Overall Cognitive Status: Difficult to assess                                 General Comments: pt HOH, has hearing aides but does not like to wear them        Exercises General Exercises -  Lower Extremity Long Arc Quad: AROM, Both, 10 reps, Seated Hip Flexion/Marching: AROM, Both, 5 reps, Seated    General Comments        Pertinent Vitals/Pain Pain Assessment Pain Assessment: 0-10 Pain Score: 7  Pain Location: R shoulder and arm Pain Descriptors / Indicators: Discomfort Pain Intervention(s): Monitored during session, Limited activity within patient's tolerance, Patient requesting pain meds-RN notified    Home Living                          Prior Function            PT Goals (current goals can now be found in the care plan section) Progress towards PT goals: Progressing toward goals    Frequency    Min 1X/week      PT Plan      Co-evaluation              AM-PAC PT "6 Clicks" Mobility   Outcome Measure  Help needed turning from your back to your side while in a flat bed without using bedrails?: A Lot Help needed moving from lying on your back to sitting on the side of a flat bed without using bedrails?: A Lot Help needed moving to and from a bed to a chair (including a wheelchair)?: A Little Help needed standing up from a chair using your arms (e.g., wheelchair or bedside chair)?: A Little Help needed to walk in hospital room?: Total Help needed climbing 3-5 steps with a railing? : Total 6 Click Score: 12    End of Session Equipment Utilized During Treatment: Gait belt Activity Tolerance: Patient tolerated treatment well Patient left: in chair;with call bell/phone within reach;with chair alarm set Nurse Communication: Mobility status PT Visit Diagnosis: Other abnormalities of gait and mobility (R26.89);Muscle weakness (generalized) (M62.81)     Time: 1610-9604 PT Time Calculation (min) (ACUTE ONLY): 32 min  Charges:    $Therapeutic Activity: 23-37 mins PT General Charges $$ ACUTE PT VISIT: 1 Visit                     Andrey Farmer. SPT Secure chat preferred    Darlin Drop 10/07/2023, 1:08 PM

## 2023-10-07 NOTE — Plan of Care (Signed)
  Problem: Fluid Volume: Goal: Ability to maintain a balanced intake and output will improve Outcome: Progressing   Problem: Health Behavior/Discharge Planning: Goal: Ability to identify and utilize available resources and services will improve Outcome: Progressing   Problem: Health Behavior/Discharge Planning: Goal: Ability to manage health-related needs will improve Outcome: Progressing   Problem: Metabolic: Goal: Ability to maintain appropriate glucose levels will improve Outcome: Progressing   Problem: Nutritional: Goal: Maintenance of adequate nutrition will improve Outcome: Progressing   Problem: Nutritional: Goal: Progress toward achieving an optimal weight will improve Outcome: Progressing   Problem: Skin Integrity: Goal: Risk for impaired skin integrity will decrease Outcome: Progressing

## 2023-10-07 NOTE — Plan of Care (Signed)
  Problem: Fluid Volume: Goal: Ability to maintain a balanced intake and output will improve Outcome: Progressing   Problem: Coping: Goal: Ability to adjust to condition or change in health will improve Outcome: Progressing   Problem: Education: Goal: Individualized Educational Video(s) Outcome: Progressing   Problem: Nutritional: Goal: Progress toward achieving an optimal weight will improve Outcome: Progressing   Problem: Skin Integrity: Goal: Risk for impaired skin integrity will decrease Outcome: Progressing

## 2023-10-07 NOTE — Progress Notes (Signed)
PROGRESS NOTE    Dakota [name]  UJW:[id number] DOB: 15-Jul-1926 DOA: 10/02/2023 PCP: [name], MD    Brief Narrative:  89 year old with history of CAD, HTN, COPD, DM 2, CHF, A-fib on Eliquis, complete heart block status post pacemaker comes to the hospital complains of fatigue and diarrhea.  Recent history of complete heart block about a month ago had a pacemaker placed, postop course complicated by hematoma treated conservatively and with Keflex.  Now is back on Eliquis.  CT scan negative for any colitis possible gastritis.   Poor oral intake as well.  PT/OT recommending SNF, daughter is interested.   Assessment & Plan:  Principal Problem:   Lactic acidosis Active Problems:   COPD, moderate (HCC)   Diabetes (HCC)   Hypercholesterolemia   Chronic systolic CHF (congestive heart failure) (HCC)   Debility   CAD (coronary artery disease) of bypass graft   Essential hypertension   Atrial fibrillation with slow ventricular response (HCC)   Diarrhea   Dehydration   Hyponatremia   Normocytic anemia   Hypotension -IVF -midodrine for now  Diarrhea/fatigue Dehydration/hyponatremia - CT abdomen/pelvis questionable for mild gastritis and cystitis.  Procalcitonin is negative.  UA negative  Overall appears to be doing better.  Continue PPI.  No evidence of leukocytosis, hold off on antibiotics.  Cough/congestion - Possible some aspiration.  Bronchodilators, I-S/flutter valve.  Scheduled bronchodilators  Hypocalcemia - Repleted  History of COPD -As needed bronchodilators  History of coronary artery disease status post CABG Congestive heart failure, EF 45% -Echocardiogram in August showed severe aortic stenosis but reviewed by cardiology during this admission.  Apparently this was an overread and overall nothing further to do at this time.  Cardiology signed off.  Appreciate their input.  Diabetes mellitus type 2 -On metformin at home.  sliding scale and Accu-Cheks  Atrial  fibrillation, chronic Symptomatic bradycardia with CHB status post pacemaker -Continue Eliquis  BPH -Supportive care  Hyperlipidemia -Statin  Chronic debility -Age-related.  Eventually we can consider PT/OT  Anemia of chronic disease -Hemoglobin around baseline of 10.6  PT/OT-SNF  Would recommend continued palliative care conversations SNF in AM  DVT prophylaxis: Eliquis Code Status: Full code Family Communication: Daughter at bedside 12/11 SNF in AM   Subjective: BP on the lower side this AM   Examination:   General: Appearance:    Thin frail appearing male in no acute distress in bed     Lungs:      respirations unlabored  Heart:    Normal heart rate.   MS:   All extremities are intact.   Neurologic:   Awake, alert               Pressure Injury 10/03/23 Buttocks Bilateral;Right Stage 2 -  Partial thickness loss of dermis presenting as a shallow open injury with a red, pink wound bed without slough. (Active)  10/03/23 0200  Location: Buttocks  Location Orientation: Bilateral;Right  Staging: Stage 2 -  Partial thickness loss of dermis presenting as a shallow open injury with a red, pink wound bed without slough.  Wound Description (Comments):   Present on Admission: Yes     Pressure Injury 10/03/23 Heel Left Unstageable - Full thickness tissue loss in which the base of the injury is covered by slough (yellow, tan, gray, green or brown) and/or eschar (tan, brown or black) in the wound bed. (Active)  10/03/23 0200  Location: Heel  Location Orientation: Left  Staging: Unstageable - Full thickness tissue loss in  which the base of the injury is covered by slough (yellow, tan, gray, green or brown) and/or eschar (tan, brown or black) in the wound bed.  Wound Description (Comments):   Present on Admission: Yes     Pressure Injury 10/03/23 Heel Right (Active)  10/03/23 0200  Location: Heel  Location Orientation: Right  Staging:   Wound Description  (Comments):   Present on Admission:      Pressure Injury 10/06/23 Vertebral column Lower Stage 2 -  Partial thickness loss of dermis presenting as a shallow open injury with a red, pink wound bed without slough. area of broken skin on lower back (Active)  10/06/23 1212  Location: Vertebral column  Location Orientation: Lower  Staging: Stage 2 -  Partial thickness loss of dermis presenting as a shallow open injury with a red, pink wound bed without slough.  Wound Description (Comments): area of broken skin on lower back  Present on Admission: Yes     Pressure Injury 10/06/23 Vertebral column Mid Stage 2 -  Partial thickness loss of dermis presenting as a shallow open injury with a red, pink wound bed without slough. round area of broken skin on mid back (Active)  10/06/23 1213  Location: Vertebral column  Location Orientation: Mid  Staging: Stage 2 -  Partial thickness loss of dermis presenting as a shallow open injury with a red, pink wound bed without slough.  Wound Description (Comments): round area of broken skin on mid back  Present on Admission: Yes     Diet Orders (From admission, onward)     Start     Ordered   10/03/23 0043  Diet Carb Modified Fluid consistency: Thin; Room service appropriate? Yes  Diet effective now       Question Answer Comment  Diet-HS Snack? Nothing   Calorie Level Medium 1600-2000   Fluid consistency: Thin   Room service appropriate? Yes      10/03/23 0042            Objective: Vitals:   10/07/23 0744 10/07/23 0915 10/07/23 1120 10/07/23 1215  BP: (!) 84/53 108/63 (!) 92/47 113/66  Pulse: 70 75 74 73  Resp: 18 19 18 19   Temp: 97.9 F (36.6 C)  97.9 F (36.6 C)   TempSrc: Oral  Oral   SpO2:  100%  99%  Weight:      Height:        Intake/Output Summary (Last 24 hours) at 10/07/2023 1311 Last data filed at 10/07/2023 1237 Gross per 24 hour  Intake 320 ml  Output 1550 ml  Net -1230 ml   Filed Weights   10/02/23 1231 10/03/23 0200   Weight: 45.6 kg 40.1 kg    Scheduled Meds:  apixaban  2.5 mg Oral BID   feeding supplement  237 mL Oral BID BM   ferrous sulfate  325 mg Oral Q breakfast   insulin aspart  0-9 Units Subcutaneous Q4H   ipratropium-albuterol  3 mL Nebulization BID   ketorolac  1 drop Left Eye QID   midodrine  5 mg Oral TID WC   mirabegron ER  50 mg Oral QHS   multivitamin with minerals  1 tablet Oral Daily   nutrition supplement (JUVEN)  1 packet Oral BID BM   pantoprazole  40 mg Oral Daily   pravastatin  80 mg Oral QHS   prednisoLONE acetate  1 drop Left Eye QID   predniSONE  5 mg Oral Daily   Continuous Infusions:  sodium chloride  75 mL/hr at 10/07/23 1212    Nutritional status Signs/Symptoms: severe muscle depletion, moderate muscle depletion, moderate fat depletion, severe fat depletion, percent weight loss Percent weight loss: 17 % Interventions: Ensure Enlive (each supplement provides 350kcal and 20 grams of protein), MVI Body mass index is 13.85 kg/m.  Data Reviewed:   CBC: Recent Labs  Lab 10/02/23 1340 10/03/23 0044 10/03/23 0405 10/04/23 0314 10/05/23 0308 10/06/23 0319 10/07/23 0250  WBC 5.7  --  6.3 6.4 7.1 7.6 5.1  NEUTROABS 4.2  --   --   --   --   --   --   HGB 10.1*   < > 10.6* 9.0* 9.3* 9.6* 8.7*  HCT 32.4*   < > 33.6* 28.5* 29.5* 29.6* 27.3*  MCV 62.4*  --  61.3* 60.9* 60.5* 60.3* 60.7*  PLT 175  --  164 161 159 164 155   < > = values in this interval not displayed.   Basic Metabolic Panel: Recent Labs  Lab 10/02/23 2352 10/02/23 2354 10/03/23 0405 10/03/23 1034 10/03/23 2232 10/04/23 0314 10/05/23 0308 10/06/23 0319 10/07/23 0250  NA  --    < > 136   < > 132* 134* 135 133* 134*  K  --    < > 3.7   < > 4.3 4.1 4.1 4.0 3.9  CL  --    < > 101   < > 99 100 100 97* 101  CO2  --    < > 26   < > 26 27 27 25 27   GLUCOSE  --    < > 106*   < > 162* 97 116* 233* 121*  BUN  --    < > 12   < > 22 20 24* 29* 33*  CREATININE  --    < > 0.75   < > 1.00 0.85 0.66  0.77 0.67  CALCIUM  --    < > 8.4*   < > 8.6* 8.4* 8.6* 8.4* 8.5*  MG 1.6*  --  2.3  --   --  2.0 1.7 1.7 1.8  PHOS 3.5  --  3.7  --   --   --   --   --   --    < > = values in this interval not displayed.   GFR: Estimated Creatinine Clearance: 31.3 mL/min (by C-G formula based on SCr of 0.67 mg/dL). Liver Function Tests: Recent Labs  Lab 10/02/23 1340 10/02/23 2354 10/03/23 0405  AST 25 23 21   ALT 16 15 16   ALKPHOS 49 45 47  BILITOT 0.9 0.9 1.2*  PROT 5.8* 5.4* 5.9*  ALBUMIN 3.2* 3.0* 3.2*   No results for input(s): "LIPASE", "AMYLASE" in the last 168 hours. No results for input(s): "AMMONIA" in the last 168 hours. Coagulation Profile: No results for input(s): "INR", "PROTIME" in the last 168 hours. Cardiac Enzymes: Recent Labs  Lab 10/02/23 2354  CKTOTAL 21*   BNP (last 3 results) No results for input(s): "PROBNP" in the last 8760 hours. HbA1C: No results for input(s): "HGBA1C" in the last 72 hours.  CBG: Recent Labs  Lab 10/06/23 2007 10/06/23 2351 10/07/23 0415 10/07/23 0746 10/07/23 1143  GLUCAP 223* 118* 116* 118* 153*   Lipid Profile: No results for input(s): "CHOL", "HDL", "LDLCALC", "TRIG", "CHOLHDL", "LDLDIRECT" in the last 72 hours. Thyroid Function Tests: No results for input(s): "TSH", "T4TOTAL", "FREET4", "T3FREE", "THYROIDAB" in the last 72 hours.  Anemia Panel: No results for input(s): "VITAMINB12", "FOLATE", "FERRITIN", "TIBC", "  IRON", "RETICCTPCT" in the last 72 hours.  Sepsis Labs: Recent Labs  Lab 10/02/23 1340 10/02/23 2354 10/03/23 0402 10/04/23 0314  PROCALCITON  --  <0.10  --  <0.10  LATICACIDVEN 2.5* 0.8 1.1  --     Recent Results (from the past 240 hours)  Culture, blood (routine x 2)     Status: None   Collection Time: 10/02/23 12:59 PM   Specimen: BLOOD RIGHT HAND  Result Value Ref Range Status   Specimen Description BLOOD RIGHT HAND  Final   Special Requests   Final    BOTTLES DRAWN AEROBIC ONLY Blood Culture results  may not be optimal due to an inadequate volume of blood received in culture bottles   Culture   Final    NO GROWTH 5 DAYS Performed at [facility], [address], [place], [place] [address]    Report Status 10/07/2023 FINAL  Final  Urine Culture     Status: Abnormal   Collection Time: 10/02/23  5:36 PM   Specimen: Urine, Clean Catch  Result Value Ref Range Status   Specimen Description URINE, CLEAN CATCH  Final   Special Requests   Final    NONE Performed at [facility], [address] [place], [place] [address]    Culture (A)  Final    10,000 COLONIES/mL KLEBSIELLA ORNITHINOLYTICA 10,000 COLONIES/mL PSEUDOMONAS AERUGINOSA    Report Status 10/05/2023 FINAL  Final   Organism ID, Bacteria KLEBSIELLA ORNITHINOLYTICA (A)  Final   Organism ID, Bacteria PSEUDOMONAS AERUGINOSA (A)  Final      Susceptibility   Klebsiella ornithinolytica - MIC*    AMPICILLIN RESISTANT Resistant     CEFEPIME <=0.12 SENSITIVE Sensitive     CEFTRIAXONE <=0.25 SENSITIVE Sensitive     CIPROFLOXACIN <=0.25 SENSITIVE Sensitive     GENTAMICIN <=1 SENSITIVE Sensitive     IMIPENEM 0.5 SENSITIVE Sensitive     NITROFURANTOIN 32 SENSITIVE Sensitive     TRIMETH/SULFA <=20 SENSITIVE Sensitive     AMPICILLIN/SULBACTAM <=2 SENSITIVE Sensitive     PIP/TAZO <=4 SENSITIVE Sensitive ug/mL    * 10,000 COLONIES/mL KLEBSIELLA ORNITHINOLYTICA   Pseudomonas aeruginosa - MIC*    CEFTAZIDIME 4 SENSITIVE Sensitive     CIPROFLOXACIN <=0.25 SENSITIVE Sensitive     GENTAMICIN <=1 SENSITIVE Sensitive     IMIPENEM 2 SENSITIVE Sensitive     PIP/TAZO 8 SENSITIVE Sensitive ug/mL    CEFEPIME 2 SENSITIVE Sensitive     * 10,000 COLONIES/mL PSEUDOMONAS AERUGINOSA  Culture, blood (routine x 2)     Status: None (Preliminary result)   Collection Time: 10/03/23  4:00 AM   Specimen: BLOOD RIGHT ARM  Result Value Ref Range Status   Specimen Description BLOOD RIGHT ARM  Final   Special Requests   Final    BOTTLES DRAWN  AEROBIC ONLY Blood Culture results may not be optimal due to an inadequate volume of blood received in culture bottles   Culture   Final    NO GROWTH 4 DAYS Performed at [facility], [address], [place], [place] [address]    Report Status PENDING  Incomplete         Radiology Studies: No results found.         LOS: 4 days   Time spent= 35 mins    Joseph Art, DO [facility]  If 7PM-7AM, please contact night-coverage  10/07/2023, 1:11 PM

## 2023-10-08 DIAGNOSIS — R1310 Dysphagia, unspecified: Secondary | ICD-10-CM | POA: Diagnosis not present

## 2023-10-08 DIAGNOSIS — E785 Hyperlipidemia, unspecified: Secondary | ICD-10-CM | POA: Diagnosis not present

## 2023-10-08 DIAGNOSIS — E43 Unspecified severe protein-calorie malnutrition: Secondary | ICD-10-CM | POA: Diagnosis not present

## 2023-10-08 DIAGNOSIS — I5022 Chronic systolic (congestive) heart failure: Secondary | ICD-10-CM | POA: Diagnosis not present

## 2023-10-08 DIAGNOSIS — I447 Left bundle-branch block, unspecified: Secondary | ICD-10-CM | POA: Diagnosis not present

## 2023-10-08 DIAGNOSIS — R0989 Other specified symptoms and signs involving the circulatory and respiratory systems: Secondary | ICD-10-CM | POA: Diagnosis not present

## 2023-10-08 DIAGNOSIS — I358 Other nonrheumatic aortic valve disorders: Secondary | ICD-10-CM | POA: Diagnosis not present

## 2023-10-08 DIAGNOSIS — E1169 Type 2 diabetes mellitus with other specified complication: Secondary | ICD-10-CM | POA: Diagnosis not present

## 2023-10-08 DIAGNOSIS — R2689 Other abnormalities of gait and mobility: Secondary | ICD-10-CM | POA: Diagnosis not present

## 2023-10-08 DIAGNOSIS — Z951 Presence of aortocoronary bypass graft: Secondary | ICD-10-CM | POA: Diagnosis not present

## 2023-10-08 DIAGNOSIS — I959 Hypotension, unspecified: Secondary | ICD-10-CM | POA: Diagnosis not present

## 2023-10-08 DIAGNOSIS — R41841 Cognitive communication deficit: Secondary | ICD-10-CM | POA: Diagnosis not present

## 2023-10-08 DIAGNOSIS — M6281 Muscle weakness (generalized): Secondary | ICD-10-CM | POA: Diagnosis not present

## 2023-10-08 DIAGNOSIS — I480 Paroxysmal atrial fibrillation: Secondary | ICD-10-CM | POA: Diagnosis not present

## 2023-10-08 DIAGNOSIS — I442 Atrioventricular block, complete: Secondary | ICD-10-CM | POA: Diagnosis not present

## 2023-10-08 DIAGNOSIS — Z7401 Bed confinement status: Secondary | ICD-10-CM | POA: Diagnosis not present

## 2023-10-08 DIAGNOSIS — Z950 Presence of cardiac pacemaker: Secondary | ICD-10-CM | POA: Diagnosis not present

## 2023-10-08 DIAGNOSIS — N401 Enlarged prostate with lower urinary tract symptoms: Secondary | ICD-10-CM | POA: Diagnosis not present

## 2023-10-08 DIAGNOSIS — E440 Moderate protein-calorie malnutrition: Secondary | ICD-10-CM | POA: Diagnosis not present

## 2023-10-08 DIAGNOSIS — E872 Acidosis, unspecified: Secondary | ICD-10-CM | POA: Diagnosis not present

## 2023-10-08 DIAGNOSIS — I4811 Longstanding persistent atrial fibrillation: Secondary | ICD-10-CM | POA: Diagnosis not present

## 2023-10-08 DIAGNOSIS — R339 Retention of urine, unspecified: Secondary | ICD-10-CM | POA: Diagnosis not present

## 2023-10-08 DIAGNOSIS — A419 Sepsis, unspecified organism: Secondary | ICD-10-CM | POA: Diagnosis not present

## 2023-10-08 DIAGNOSIS — R338 Other retention of urine: Secondary | ICD-10-CM | POA: Diagnosis not present

## 2023-10-08 DIAGNOSIS — J449 Chronic obstructive pulmonary disease, unspecified: Secondary | ICD-10-CM | POA: Diagnosis not present

## 2023-10-08 LAB — CBC
HCT: 24.5 % — ABNORMAL LOW (ref 39.0–52.0)
Hemoglobin: 7.9 g/dL — ABNORMAL LOW (ref 13.0–17.0)
MCH: 19.5 pg — ABNORMAL LOW (ref 26.0–34.0)
MCHC: 32.2 g/dL (ref 30.0–36.0)
MCV: 60.3 fL — ABNORMAL LOW (ref 80.0–100.0)
Platelets: 147 K/uL — ABNORMAL LOW (ref 150–400)
RBC: 4.06 MIL/uL — ABNORMAL LOW (ref 4.22–5.81)
RDW: 15.9 % — ABNORMAL HIGH (ref 11.5–15.5)
WBC: 4.9 K/uL (ref 4.0–10.5)
nRBC: 0 % (ref 0.0–0.2)

## 2023-10-08 LAB — GLUCOSE, CAPILLARY
Glucose-Capillary: 93 mg/dL (ref 70–99)
Glucose-Capillary: 104 mg/dL — ABNORMAL HIGH (ref 70–99)
Glucose-Capillary: 188 mg/dL — ABNORMAL HIGH (ref 70–99)

## 2023-10-08 LAB — BASIC METABOLIC PANEL
Anion gap: 3 — ABNORMAL LOW (ref 5–15)
BUN: 32 mg/dL — ABNORMAL HIGH (ref 8–23)
CO2: 27 mmol/L (ref 22–32)
Calcium: 8.2 mg/dL — ABNORMAL LOW (ref 8.9–10.3)
Chloride: 104 mmol/L (ref 98–111)
Creatinine, Ser: 0.55 mg/dL — ABNORMAL LOW (ref 0.61–1.24)
GFR, Estimated: >60 mL/min (ref >60)
Glucose, Bld: 85 mg/dL (ref 70–99)
Potassium: 4.1 mmol/L (ref 3.5–5.1)
Sodium: 134 mmol/L — ABNORMAL LOW (ref 135–145)

## 2023-10-08 LAB — CULTURE, BLOOD (ROUTINE X 2): Culture: NO GROWTH

## 2023-10-08 LAB — MAGNESIUM: Magnesium: 1.9 mg/dL (ref 1.7–2.4)

## 2023-10-08 MED ORDER — MIDODRINE HCL 5 MG PO TABS: 5.0000 mg | ORAL_TABLET | Freq: Three times a day (TID) | ORAL | Status: DC

## 2023-10-08 MED ORDER — MIDODRINE HCL 10 MG PO TABS: 10.0000 mg | ORAL_TABLET | Freq: Three times a day (TID) | ORAL | Status: AC

## 2023-10-08 MED ORDER — IPRATROPIUM-ALBUTEROL 0.5-2.5 (3) MG/3ML IN SOLN: 3.0000 mL | Freq: Two times a day (BID) | RESPIRATORY_TRACT | Status: AC

## 2023-10-08 MED ORDER — MIDODRINE HCL 5 MG PO TABS
10.0000 mg | ORAL_TABLET | Freq: Three times a day (TID) | ORAL | Status: DC
  Administered 2023-10-08: 10 mg via ORAL

## 2023-10-08 MED ORDER — FERROUS SULFATE 325 (65 FE) MG PO TABS: 325.0000 mg | ORAL_TABLET | Freq: Every day | ORAL | Status: AC

## 2023-10-08 MED ORDER — IPRATROPIUM-ALBUTEROL 0.5-2.5 (3) MG/3ML IN SOLN
3.0000 mL | Freq: Two times a day (BID) | RESPIRATORY_TRACT | Status: DC
  Administered 2023-10-08: 3 mL via RESPIRATORY_TRACT
  Filled 2023-10-08: qty 3

## 2023-10-08 NOTE — Discharge Summary (Signed)
Physician Discharge Summary  Dakota [name]:[id number] DOB: [birth year] DOA: 10/02/2023  PCP: [name], MD  Admit date: 10/02/2023 Discharge date: 10/08/2023  Admitted From: home Discharge disposition: SNF   Recommendations for Outpatient Follow-Up:   Adjust midodrine as needed for BP Encourage PO intake-- needs feeding Would recommend a palliative care referral for GOC Flutter/IS Bmp/cbc 1 week   Discharge Diagnosis:   Principal Problem:   Lactic acidosis Active Problems:   COPD, moderate (HCC)   Diabetes (HCC)   Hypercholesterolemia   Chronic systolic CHF (congestive heart failure) (HCC)   Debility   CAD (coronary artery disease) of bypass graft   Essential hypertension   Atrial fibrillation with slow ventricular response (HCC)   Diarrhea   Dehydration   Hyponatremia   Normocytic anemia   Sepsis (HCC)   Protein-calorie malnutrition, severe    Discharge Condition: Improved.  Diet recommendation:   Regular.  Wound care: None.  Code status: Full.   History of Present Illness:   Dakota [name] is a 89 y.o. male with medical history significant of CAD, HTN, COPD, DM2, CHF, a.fib on eliquis, complete heart block status post pacemaker     Presented with   diarrhea and fatigue Presents with diarrhea and fatigue Recent history significant for admission for complete heart block on 3 November requiring pacemaker placed on the fifth. Patient postoperative course complicated by hematoma for which she was treated with Keflex for 1 week Now back on Eliquis He developed severe diarrhea and has became more fatigued.  No associated fevers Of note family is now interested in short-term rehab placement secondary to worsening debility Daughter states that she has been giving him Colace twice a day and usually when she does that he has diarrhea   Denies significant ETOH intake     Hospital Course by Problem:   Hypotension -midodrine for now -no  symptoms   Diarrhea/fatigue Dehydration/hyponatremia - CT abdomen/pelvis questionable for mild gastritis and cystitis.  Procalcitonin is negative.  UA negative  Overall appears to be doing better.  Continue PPI.  No evidence of leukocytosis, hold off on antibiotics. -suspect medication related -needs to be fed so please offer liquids frequently   Cough/congestion - Possible some aspiration.  Bronchodilators, I-S/flutter valve.  Scheduled bronchodilators   Hypocalcemia - Repleted   History of COPD -As needed bronchodilators   History of coronary artery disease status post CABG Congestive heart failure, EF 45% -Echocardiogram in August showed severe aortic stenosis but reviewed by cardiology during this admission.  Apparently this was an overread and overall nothing further to do at this time.  Cardiology signed off.  Appreciate their input.   Diabetes mellitus type 2 -On metformin at home   Atrial fibrillation, chronic Symptomatic bradycardia with CHB status post pacemaker- interrogated with no issues -Continue Eliquis   BPH -Supportive care   Hyperlipidemia -Statin   Chronic debility -Age-related -palliative care referral   Anemia of chronic disease -stable    Medical Consultants:    cards  Discharge Exam:   Vitals:   10/08/23 0736 10/08/23 0748  BP:  (!) 101/55  Pulse:  70  Resp: 18 18  Temp:  (!) 97.4 F (36.3 C)  SpO2:  99%   Vitals:   10/07/23 2300 10/08/23 0320 10/08/23 0736 10/08/23 0748  BP: (!) 92/49 (!) 143/116  (!) 101/55  Pulse: 69 70  70  Resp: 20 20 18 18   Temp: 97.7 F (36.5 C) 98 F (36.7  C)  (!) 97.4 F (36.3 C)  TempSrc: Oral Axillary  Oral  SpO2: 99% 98%  99%  Weight:      Height:        General exam: Appears calm and comfortable.    The results of significant diagnostics from this hospitalization (including imaging, microbiology, ancillary and laboratory) are listed below for reference.     Procedures and Diagnostic  Studies:   CT ABDOMEN PELVIS W CONTRAST Result Date: 10/02/2023 CLINICAL DATA:  Lethargy.  History of UTI. EXAM: CT ABDOMEN AND PELVIS WITH CONTRAST TECHNIQUE: Multidetector CT imaging of the abdomen and pelvis was performed using the standard protocol following bolus administration of intravenous contrast. RADIATION DOSE REDUCTION: This exam was performed according to the departmental dose-optimization program which includes automated exposure control, adjustment of the mA and/or kV according to patient size and/or use of iterative reconstruction technique. CONTRAST:  75mL OMNIPAQUE IOHEXOL 350 MG/ML SOLN COMPARISON:  05/10/2023. FINDINGS: Lower chest: No acute abnormality. Hepatobiliary: No focal liver abnormality is seen. No gallstones, gallbladder wall thickening, or biliary dilatation. Pancreas: Unremarkable. No pancreatic ductal dilatation or surrounding inflammatory changes. Spleen: Normal in size without focal abnormality. Adrenals/Urinary Tract: No adrenal mass. Kidneys normal in overall size, orientation and position with symmetric enhancement and excretion. Bilateral stable renal cysts measuring up to 1.8 cm. No follow-up indicated. No stones. Mild prominence of both intrarenal collecting systems and proximal ureters. No ureteral stone. Ureters not well-defined, but grossly normal in course and in caliber. Mild prominence of the bladder wall without overt thickening. No bladder mass or stone. Stomach/Bowel: Stomach is decompressed. There is low attenuation infiltrating the fat surrounding the stomach extending to the adjacent transverse colon. No convincing stomach wall thickening. Small bowel and colon are normal in caliber. No wall thickening or convincing inflammation. Vascular/Lymphatic: Aortic atherosclerosis. No aneurysm. No enlarged lymph nodes. Reproductive: Heterogeneous prostate, mildly enlarged. Other: No abdominal wall hernia or abnormality. No abdominopelvic ascites. Musculoskeletal: No  fracture or acute finding. Skeletal structures are demineralized. There is a mottled type appearance of the skeleton with small ill-defined lucencies that are felt to be due to the diffuse bony demineralization. No defined bone lesion and no change. IMPRESSION: 1. There is hazy low-attenuation fat surrounds the stomach mass suggests inflammation/edema, but there is no evidence of stomach wall thickening. This still may reflect gastritis, but is nonspecific. 2. Mild prominence of the intrarenal collecting systems and proximal ureters. Bladder wall appears prominent but not overly thickened. Consider cystitis in the proper clinical setting. No CT evidence of pyelonephritis. 3. No other evidence of an acute abnormality within the abdomen or pelvis. 4. Aortic atherosclerosis. Aortic Atherosclerosis (ICD10-I70.0). Electronically Signed   By: Amie Portland M.D.   On: 10/02/2023 16:59   DG Chest Portable 1 View Result Date: 10/02/2023 CLINICAL DATA:  Altered mental status. EXAM: PORTABLE CHEST 1 VIEW COMPARISON:  09/01/2023 FINDINGS: There is a left chest wall single lead pacer device with lead terminating in the right ventricle. Status post median sternotomy CABG procedure. Stable cardiomediastinal contours. Blunting of the left costophrenic angle identified, unchanged from previous exam. Scar like densities noted in the left base. No signs of interstitial edema, airspace consolidation or pneumothorax. Bilateral glenohumeral joint osteoarthritis with marked narrowing of the acromial humeral interval bilaterally. IMPRESSION: 1. No acute cardiopulmonary abnormalities. 2. Chronic blunting of the left costophrenic angle. 3. Bilateral glenohumeral joint osteoarthritis with marked narrowing of the acromial humeral interval bilaterally. Electronically Signed   By: Veronda Prude.D.  On: 10/02/2023 13:34     Labs:   Basic Metabolic Panel: Recent Labs  Lab 10/02/23 2352 10/02/23 2354 10/03/23 0405 10/03/23 1034  10/04/23 0314 10/05/23 0308 10/06/23 0319 10/07/23 0250 10/08/23 0334  NA  --    < > 136   < > 134* 135 133* 134* 134*  K  --    < > 3.7   < > 4.1 4.1 4.0 3.9 4.1  CL  --    < > 101   < > 100 100 97* 101 104  CO2  --    < > 26   < > 27 27 25 27 27   GLUCOSE  --    < > 106*   < > 97 116* 233* 121* 85  BUN  --    < > 12   < > 20 24* 29* 33* 32*  CREATININE  --    < > 0.75   < > 0.85 0.66 0.77 0.67 0.55*  CALCIUM  --    < > 8.4*   < > 8.4* 8.6* 8.4* 8.5* 8.2*  MG 1.6*  --  2.3  --  2.0 1.7 1.7 1.8 1.9  PHOS 3.5  --  3.7  --   --   --   --   --   --    < > = values in this interval not displayed.   GFR Estimated Creatinine Clearance: 31.3 mL/min (A) (by C-G formula based on SCr of 0.55 mg/dL (L)). Liver Function Tests: Recent Labs  Lab 10/02/23 1340 10/02/23 2354 10/03/23 0405  AST 25 23 21   ALT 16 15 16   ALKPHOS 49 45 47  BILITOT 0.9 0.9 1.2*  PROT 5.8* 5.4* 5.9*  ALBUMIN 3.2* 3.0* 3.2*   No results for input(s): "LIPASE", "AMYLASE" in the last 168 hours. No results for input(s): "AMMONIA" in the last 168 hours. Coagulation profile No results for input(s): "INR", "PROTIME" in the last 168 hours.  CBC: Recent Labs  Lab 10/02/23 1340 10/03/23 0044 10/04/23 0314 10/05/23 0308 10/06/23 0319 10/07/23 0250 10/08/23 0334  WBC 5.7   < > 6.4 7.1 7.6 5.1 4.9  NEUTROABS 4.2  --   --   --   --   --   --   HGB 10.1*   < > 9.0* 9.3* 9.6* 8.7* 7.9*  HCT 32.4*   < > 28.5* 29.5* 29.6* 27.3* 24.5*  MCV 62.4*   < > 60.9* 60.5* 60.3* 60.7* 60.3*  PLT 175   < > 161 159 164 155 147*   < > = values in this interval not displayed.   Cardiac Enzymes: Recent Labs  Lab 10/02/23 2354  CKTOTAL 21*   BNP: Invalid input(s): "POCBNP" CBG: Recent Labs  Lab 10/07/23 1820 10/07/23 2023 10/07/23 2308 10/08/23 0420 10/08/23 0747  GLUCAP 200* 154* 184* 93 104*   D-Dimer No results for input(s): "DDIMER" in the last 72 hours. Hgb A1c No results for input(s): "HGBA1C" in the last 72  hours. Lipid Profile No results for input(s): "CHOL", "HDL", "LDLCALC", "TRIG", "CHOLHDL", "LDLDIRECT" in the last 72 hours. Thyroid function studies No results for input(s): "TSH", "T4TOTAL", "T3FREE", "THYROIDAB" in the last 72 hours.  Invalid input(s): "FREET3" Anemia work up No results for input(s): "VITAMINB12", "FOLATE", "FERRITIN", "TIBC", "IRON", "RETICCTPCT" in the last 72 hours. Microbiology Recent Results (from the past 240 hours)  Culture, blood (routine x 2)     Status: None   Collection Time: 10/02/23 12:59 PM  Specimen: BLOOD RIGHT HAND  Result Value Ref Range Status   Specimen Description BLOOD RIGHT HAND  Final   Special Requests   Final    BOTTLES DRAWN AEROBIC ONLY Blood Culture results may not be optimal due to an inadequate volume of blood received in culture bottles   Culture   Final    NO GROWTH 5 DAYS Performed at [facility], [address] [place], [place] [address]    Report Status 10/07/2023 FINAL  Final  Urine Culture     Status: Abnormal   Collection Time: 10/02/23  5:36 PM   Specimen: Urine, Clean Catch  Result Value Ref Range Status   Specimen Description URINE, CLEAN CATCH  Final   Special Requests   Final    NONE Performed at [facility], [address] [place], [place] [address]    Culture (A)  Final    10,000 COLONIES/mL KLEBSIELLA ORNITHINOLYTICA 10,000 COLONIES/mL PSEUDOMONAS AERUGINOSA    Report Status 10/05/2023 FINAL  Final   Organism ID, Bacteria KLEBSIELLA ORNITHINOLYTICA (A)  Final   Organism ID, Bacteria PSEUDOMONAS AERUGINOSA (A)  Final      Susceptibility   Klebsiella ornithinolytica - MIC*    AMPICILLIN RESISTANT Resistant     CEFEPIME <=0.12 SENSITIVE Sensitive     CEFTRIAXONE <=0.25 SENSITIVE Sensitive     CIPROFLOXACIN <=0.25 SENSITIVE Sensitive     GENTAMICIN <=1 SENSITIVE Sensitive     IMIPENEM 0.5 SENSITIVE Sensitive     NITROFURANTOIN 32 SENSITIVE Sensitive     TRIMETH/SULFA <=20 SENSITIVE  Sensitive     AMPICILLIN/SULBACTAM <=2 SENSITIVE Sensitive     PIP/TAZO <=4 SENSITIVE Sensitive ug/mL    * 10,000 COLONIES/mL KLEBSIELLA ORNITHINOLYTICA   Pseudomonas aeruginosa - MIC*    CEFTAZIDIME 4 SENSITIVE Sensitive     CIPROFLOXACIN <=0.25 SENSITIVE Sensitive     GENTAMICIN <=1 SENSITIVE Sensitive     IMIPENEM 2 SENSITIVE Sensitive     PIP/TAZO 8 SENSITIVE Sensitive ug/mL    CEFEPIME 2 SENSITIVE Sensitive     * 10,000 COLONIES/mL PSEUDOMONAS AERUGINOSA  Culture, blood (routine x 2)     Status: None (Preliminary result)   Collection Time: 10/03/23  4:00 AM   Specimen: BLOOD RIGHT ARM  Result Value Ref Range Status   Specimen Description BLOOD RIGHT ARM  Final   Special Requests   Final    BOTTLES DRAWN AEROBIC ONLY Blood Culture results may not be optimal due to an inadequate volume of blood received in culture bottles   Culture   Final    NO GROWTH 4 DAYS Performed at [facility], [address] [place], [place] [address]    Report Status PENDING  Incomplete     Discharge Instructions:   Discharge Instructions     Diet Carb Modified   Complete by: As directed    Discharge wound care:   Complete by: As directed    Per facility   Increase activity slowly   Complete by: As directed       Allergies as of 10/08/2023       Reactions   Aceon [perindopril Erbumine] Shortness Of Breath, Cough   Sulfa Antibiotics Swelling   Pork-derived Products         Medication List     PAUSE taking these medications    furosemide 20 MG tablet Wait to take this until your doctor or other care provider tells you to start again. Commonly known as: LASIX TAKE 1 TABLET  BY MOUTH EVERY OTHER DAY       STOP taking these medications    isosorbide mononitrate 30 MG 24 hr tablet Commonly known as: IMDUR   losartan 25 MG tablet Commonly known as: COZAAR   Nitrostat 0.4 MG SL tablet Generic drug: nitroGLYCERIN       TAKE these medications    Acetaminophen  Extra Strength 500 MG Tabs Commonly known as: TYLENOL Take 1 tablet (500 mg total) by mouth every 8 (eight) hours as needed for moderate pain.   albuterol 108 (90 Base) MCG/ACT inhaler Commonly known as: VENTOLIN HFA Inhale 2 puffs into the lungs every 4 (four) hours as needed for wheezing or shortness of breath.   apixaban 2.5 MG Tabs tablet Commonly known as: ELIQUIS Take 1 tablet (2.5 mg total) by mouth 2 (two) times daily.   docusate sodium 100 MG capsule Commonly known as: COLACE Take 1 capsule (100 mg total) by mouth 2 (two) times daily What changed: when to take this   ferrous sulfate 325 (65 FE) MG tablet Take 1 tablet (325 mg total) by mouth daily with breakfast.   finasteride 5 MG tablet Commonly known as: PROSCAR Take 5 mg by mouth at bedtime.   ipratropium-albuterol 0.5-2.5 (3) MG/3ML Soln Commonly known as: DUONEB Take 3 mLs by nebulization 2 (two) times daily.   ketorolac 0.5 % ophthalmic solution Commonly known as: ACULAR Place 1 drop into the left eye 4 (four) times daily.   metFORMIN 500 MG tablet Commonly known as: GLUCOPHAGE Take 500 mg by mouth 2 (two) times daily.   midodrine 10 MG tablet Commonly known as: PROAMATINE Take 1 tablet (10 mg total) by mouth 3 (three) times daily with meals.   mirabegron ER 50 MG Tb24 tablet Commonly known as: MYRBETRIQ Take 50 mg by mouth at bedtime.   multivitamin with minerals Tabs tablet Take 1 tablet by mouth daily.   ONE TOUCH ULTRA 2 w/Device Kit daily in the afternoon.   ONE TOUCH ULTRA TEST test strip Generic drug: glucose blood 1 each by Other route as needed (glucose monoring).   pantoprazole 40 MG tablet Commonly known as: PROTONIX Take 1 tablet (40 mg total) by mouth daily.   pravastatin 80 MG tablet Commonly known as: PRAVACHOL Take 80 mg by mouth at bedtime.   prednisoLONE acetate 1 % ophthalmic suspension Commonly known as: PRED FORTE Place 1 drop into the left eye 4 (four) times  daily.   predniSONE 5 MG tablet Commonly known as: DELTASONE Take 5 tablets by mouth daily.   PRESERVISION AREDS 2 PO Take 1 tablet by mouth 2 (two) times daily.               Discharge Care Instructions  (From admission, onward)           Start     Ordered   10/08/23 0000  Discharge wound care:       Comments: Per facility   10/08/23 0805              Time coordinating discharge: 45 min  Signed:  Joseph Art DO  [facility] 10/08/2023, 8:06 AM

## 2023-10-08 NOTE — TOC Transition Note (Signed)
Transition of Care [facility]) - Discharge Note   Patient Details  Name: Dakota [name] MRN: [id number] Date of Birth: 24-Apr-1926  [facility] CM/SW Contact:  Eduard Roux, LCSW Phone Number: 10/08/2023, 12:02 PM   Clinical Narrative:     Patient will Discharge to: [facility]  Discharge Date: 10/08/2023 Family Notified: daughter Transport [id number]  Per MD patient is ready for discharge. RN, patient, and facility notified of discharge. Discharge Summary sent to facility. RN given number for [phone]. Ambulance transport requested for patient.   Clinical Social Worker signing off.  Antony Blackbird, MSW, LCSW Clinical Social Worker     Final next level of care: Skilled Nursing Facility Barriers to Discharge: Barriers Resolved   Patient Goals and CMS Choice            Discharge Placement              Patient chooses bed at: [facility] Patient to be transferred to facility by: [facility] Name of family member notified: daughter Patient and family notified of of transfer: 10/08/23  Discharge Plan and Services Additional resources added to the After Visit Summary for   In-house Referral: Clinical Social Work                                   Social Drivers of Health (SDOH) Interventions SDOH Screenings   Food Insecurity: No Food Insecurity (10/03/2023)  Housing: Low Risk  (10/07/2023)  Transportation Needs: No Transportation Needs (10/03/2023)  Utilities: Not At Risk (10/03/2023)  Tobacco Use: Low Risk  (10/03/2023)     Readmission Risk Interventions    09/02/2023    1:43 PM  Readmission Risk Prevention Plan  Transportation Screening Complete  Home Care Screening Complete  Medication Review (RN CM) Complete

## 2023-10-11 ENCOUNTER — Encounter: Payer: Self-pay | Admitting: Cardiovascular Disease

## 2023-10-11 ENCOUNTER — Ambulatory Visit: Payer: Medicare HMO | Attending: Cardiovascular Disease | Admitting: Cardiovascular Disease

## 2023-10-11 VITALS — BP 89/53 | HR 71 | Ht 67.0 in | Wt 93.0 lb

## 2023-10-11 DIAGNOSIS — Z951 Presence of aortocoronary bypass graft: Secondary | ICD-10-CM

## 2023-10-11 DIAGNOSIS — I447 Left bundle-branch block, unspecified: Secondary | ICD-10-CM | POA: Diagnosis not present

## 2023-10-11 DIAGNOSIS — I959 Hypotension, unspecified: Secondary | ICD-10-CM | POA: Diagnosis not present

## 2023-10-11 DIAGNOSIS — I358 Other nonrheumatic aortic valve disorders: Secondary | ICD-10-CM

## 2023-10-11 DIAGNOSIS — E785 Hyperlipidemia, unspecified: Secondary | ICD-10-CM | POA: Diagnosis not present

## 2023-10-11 DIAGNOSIS — Z950 Presence of cardiac pacemaker: Secondary | ICD-10-CM | POA: Diagnosis not present

## 2023-10-11 DIAGNOSIS — I442 Atrioventricular block, complete: Secondary | ICD-10-CM | POA: Diagnosis not present

## 2023-10-11 DIAGNOSIS — I10 Essential (primary) hypertension: Secondary | ICD-10-CM

## 2023-10-11 DIAGNOSIS — I5043 Acute on chronic combined systolic (congestive) and diastolic (congestive) heart failure: Secondary | ICD-10-CM

## 2023-10-11 DIAGNOSIS — I5022 Chronic systolic (congestive) heart failure: Secondary | ICD-10-CM

## 2023-10-11 DIAGNOSIS — I4811 Longstanding persistent atrial fibrillation: Secondary | ICD-10-CM

## 2023-10-11 NOTE — Patient Instructions (Addendum)
Medication Instructions:  Wear support stocking 20-48ml. You may purchase the stockings from your local medical equipment store.   The current medical regimen is effective;  continue present plan and medications as directed. Please refer to the Current Medication list given to you today.    *If you need a refill on your cardiac medications before your next appointment, please call your pharmacy*   Lab Work: No labs were ordered during today's visit.  If you have labs (blood work) drawn today and your tests are completely normal, you will receive your results only by: MyChart Message (if you have MyChart) OR A paper copy in the mail If you have any lab test that is abnormal or we need to change your treatment, we will call you to review the results.   Testing/Procedures: No procedures ordered today.    Follow-Up: At [facility], you and your health needs are our priority.  As part of our continuing mission to provide you with exceptional heart care, we have created designated Provider Care Teams.  These Care Teams include your primary Cardiologist (physician) and Advanced Practice Providers (APPs -  Physician Assistants and Nurse Practitioners) who all work together to provide you with the care you need, when you need it.  We recommend signing up for the patient portal called "MyChart".  Sign up information is provided on this After Visit Summary.  MyChart is used to connect with patients for Virtual Visits (Telemedicine).  Patients are able to view lab/test results, encounter notes, upcoming appointments, etc.  Non-urgent messages can be sent to your provider as well.   To learn more about what you can do with MyChart, go to [email or web address].    Your next appointment:   5 month(s)  Provider:   Edd Fabian, FNP    Then, Dr. Carolan Clines will plan to see you again in 1 year(s).  Other Instructions

## 2023-10-11 NOTE — Progress Notes (Unsigned)
Patient ID: Dakota [name], male   DOB: [birth year], 89 y.o.   MRN: [id number]     Primary M.D.: Dr. Gerlene Burdock Tisovic  HPI: Dakota [name] is a 89 year old  former patient of Dr. Patty Sermons.  He presents for a 87month follow-up evaluation.  [name] has CAD and underwent CABG revascularization surgery in 2005 with a LIMA to the LAD, SVG to the diagonal, SVG to the obtuse marginal, and SVG to his distal RCA.  He has been demonstrated to have combined systolic and diastolic heart failure, left bundle branch block and diabetes mellitus.  In 2013.  An echo Doppler study showed an EF of 50-55% with moderate diastolic dysfunction.  In July 2016 he developed symptoms worrisome for unstable angina.  A nuclear perfusion study was abnormal and demonstrated inferior to inferolateral ischemia.  He underwent cardiac catheterization by me on 05/21/2015.  This revealed low normal global LV function with an EF of 50-55%.  There was significant native CAD with 95% distal left main stenosis, proximal occlusion of the LAD after the first 2 septal perforating arteries, total occlusion of the circumflex at the ostium, and RCA stenoses, 50% proximally, 50% mid, and total occlusion of the distal RCA after a small PDA-like vessel.  He had a patent LIMA graft supplying the mid LAD.  The vein graft supplying the circumflex marginal had a 99% distal anastomosis stenosis.  He had a patent vein graft supplying the diagonal vessel and a pain vein graft supplying the distal RCA with some collateralization of the AV groove circumflex from the PLA vessel. I performed successful intervention to the vein graft supplying the obtuse marginal vessel and a 3.016 mm Synergy DES stent postdilated to 3.43 mm was inserted with resumption of brisk TIMI-3 flow and 0 residual stenosis.  This procedure was complicated by a small pseudoaneurysm which essentially self thrombosed and he did not require any thrombin injection.  He last saw Dr. Patty Sermons in January  2017.   He established cardiology care with me in the office setting on 03/26/2016.  He denies any episodes of chest pain.  The patient has remained fairly active.  He still works at [employer] 4 days per week for 8 hours shifts.  He was treated for bronchitis.  He has noticed some mild shortness of breath with activity. He is unaware of palpitations.  He denies significant leg swelling.  He has not had recent blood work.   A repeat echo Doppler study on 11/04/2016 demonstrated normal systolic function with an EF of 50-55% with mild LVH and no regional wall motion abnormalities.  He had grade 2 diastolic dysfunction.  There was moderate aortic valve sclerosis without stenosis, moderate left atrial dilatation, mild right atrial dilatation, and mild pulmonary hypertension with a PA estimated pressure 36 mm.  6 months, he continues to do well.  He again denies recurrent anginal type symptoms.  I reviewed lab work done by Dr. Neale Burly on December 07, 2016.  Total cholesterol was 124, triglycerides 65, HDL 46, and LDL 65.  Glucose was 101.  Creatinine was 0.9.  When I last saw him in February 2019 he was continuing to remain stable from a cardiovascular standpoint. He was taken off Amaryl by Dr. Neale Burly.  Subsequent blood work  had shown his glucose increased at 141.  He was mildly anemic with a hemoglobin of 10.9, hematocrit 36.3.  Lipid studies were excellent with a total cholesterol 114, HDL 51, triglycerides 93, LDL 45.[name]  He continues  to work at [employer] from 9 AM until 6 PM on Thursday, Friday, Saturday, and Sundays.    He was hospitalized on June 15 and discharged June 17, 89 2019 with left-sided chest pain not nitrate responsive.  He was subsequently seen by Joni Reining, NP in the office on April 21, 2018 and there was some confusion concerning his medications.  At time losartan was discontinued and he was started on low-dose nitrate therapy.  I saw him in September 89.  Over the prior several months he  had felt well.  He was taking furosemide 40 mg every other day isosorbide at dinnertime and icontinued to be on aspirin and Plavix.  He is on pravastatin 80 mg at bedtime.  He has noticed recently his blood pressure has been more increased.  He was no longer on the losartan.  At that time, his ECG showed sinus bradycardia with first-degree block and then transition into PAF with ventricular rate at 50.  He has chronic left bundle branch block.  I scheduled him for follow-up echo Doppler study.  I had not seen him since September 2020 but he was seen in follow-up on August 07, 2019 by Edd Fabian, NP.  His echo Doppler study from September 2020 showed an EF of 50 to 55%.  There was mild TR, aortic valve calcification with mild AR but no stenosis.  Apparently, in April 2021 there was a concern of possible TIA where his right arm suddenly did not work well with complete normalization.  He was last evaluated on October 6, 89 by Theodore Demark, PA at that time, he denied any recurrent chest pain.  He denied any awareness of palpitations or heart rate irregularity.  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  However now he is able to sit in a chair and set his stand up all day.  He has noticed some mild ankle swelling bilaterally.  He denies chest pain.  He has continued to be on amlodipine 2.5 mg, furosemide 20 mg every other day, isosorbide 30 mg daily, and has been without anginal symptoms.  He is on pravastatin 80 mg for hyperlipidemia.  He is on low-dose Eliquis 2.5 mg twice a day for anticoagulation.  He is on pantoprazole for GERD.    He was seen by Dr. [place] on January 27, 89 with reports of increased dyspnea and edema for several weeks.  He had cough with some yellow phlegm production and had taken Lasix.  In that evaluation furosemide was increased to 40 mg twice a day.  He was instructed  that if he became worse he may be a candidate for possible thoracentesis as he was noted to have bilateral pleural effusion on chest x-ray.  He was felt to have acute on chronic combined heart failure.  He was seen on November 29, 2020 by Edd Fabian and was feeling better.  His ankle swelling had improved.  I saw him on Mar 12, 2021.  He retired from [employer] in April 2022.  His edema had improved but he continued to have some dependent edema.  He denies any chest pain.  Medications included amlodipine 2.5 mg, furosemide 40 mg every other day, isosorbide 30 mg daily.  He is on Eliquis 2.5 mg twice a day for anticoagulation.  He also is on pravastatin 80 mg daily.  He had bilateral  inguinal hernias which were nontender.  In light of his recent CHF exacerbation, I recommended a follow-up echo Doppler study.  On April 14, 2021 his echo Doppler evaluation now showed moderate LV dysfunction with EF of 35 to 40% and global hypokinesis.  His right ventricle was mildly enlarged.  There was mild MR.  He had moderate right atrial and severe left atrial dilatation.  There was mild to moderate aortic sclerosis without stenosis.  When I  saw him on June 06, 2021 he denied any chest pain and continued to have dependent mediated venous stasis changes to his lower extremities.  He had not had recent laboratory.  He was takingfurosemide 40 mg every other day, amlodipine 2.5 mg daily, isosorbide 30 mg daily, in addition to Eliquis 2.5 mg twice a day on pravastatin 80 mg.  With his echo Doppler study showing reduced LV function I recommended resumption of low-dose losartan at 25 mg.  So that his blood pressure would not get too low I reduced furosemide to 20 mg every other day and repeat laboratory was recommended.  He continued to be on pravastatin for hyperlipidemia.  I saw him on January 27, 2022 which time he felt well.  He was being very diligent with his diet and was trying to reduce his sweets and carbohydrates.  He  denied any chest pain, presyncope or syncope.  There was no bleeding on anticoagulation.    He was evaluated by Dr. Servando Salina on November 25, 2022.  At times he does note some heartburn symptoms and some occasional shortness of breath.  He denies chest tightness.  He sees Dr. [name] for primary care.  He continues to be on low-dose Eliquis 2.5 mg twice a day for anticoagulation.  He has been taking furosemide 20 mg every other day, losartan 25 mg daily, isosorbide 30 mg daily in addition to amlodipine 2.5 mg for his CAD and hypertension.  He continues to take pravastatin 80 mg for hyperlipidemia.  GERD is controlled with pantoprazole.  He sees Dr. [name] who checks laboratory.   Admitted to the hospital on May 10, 2023 and discharged on May 17, 2023.  He underwent right inguinal hernia pair and preoperatively at transthoracic echo showed EF 40 to 45% with global hypokinesis mild concentric LVH and dyssynchrony due to left bundle branch block.  He had severe aortic valve calcification without significant aortic stenosis.  He was subsequently lysed from lumbar 3 through September 12, 2023 with the symptomatic bradycardia and pleat heart block and was evaluated by Dr. Ladona Ridgel.  He underwent permanent pacemaker implantation on August 31, 2023 was discharged on September 02, 2023.  He was rehospitalized on October 02, 2023 with low blood pressure, diarrhea and fatigue and was treated with midodrine.  Early he is in [place] rehab.  Is undergoing remote pacemaker checks and has an evaluation scheduled with Dr. Ladona Ridgel in February 2024.  He continues to be on low-dose Eliquis at 2.5 mg twice a day, midodrine 10 mg 3 times a day, pravastatin 80 mg, pantoprazole, and prednisone.  He presents for evaluation.  Past Medical History:  Diagnosis Date   Aortic atherosclerosis (HCC)    Arthritis    Atrial fibrillation, chronic (HCC)    Benign localized prostatic hyperplasia with lower urinary tract symptoms (LUTS)    CAD  (coronary artery disease)    Chronic chest pain    takes imdur   Chronic combined systolic (congestive) and diastolic (congestive) heart failure (HCC)    followed by  cardiology   Chronic dyspnea    Cognitive impairment    COPD, moderate (HCC)    Edema of both lower extremities    takes lasix and uses teds   First degree heart block    GERD (gastroesophageal reflux disease)    Heart murmur    History of cardiovascular stress test    Lexiscan Myoview 7/16:  EF 51%, inferior, inferolateral ischemia; Intermediate Risk   History of smallpox    HOH (hard of hearing)    even with hearing aids   Hypertension    Ischemic heart disease 2005   Dr Tresa Endo a. s/p CABG;  b.  LHC 05/23/15:  S-RPAVB ok, S-D2 ok, S-OM2 99% (s/p Synergy DES), L-LAD ok, EF normal (complicated by pseudoaneurysm)    LBBB (left bundle branch block)    Right arm weakness    S/P CABG x 4    01-08-2004   S/P drug eluting coronary stent placement    05-21-2015  PCI and DES x1 to SVG--OM graft   Thalassemia minor    Type 2 diabetes, diet controlled (HCC)    Wears glasses    Wears hearing aid in both ears     Past Surgical History:  Procedure Laterality Date   APPENDECTOMY     CARDIAC CATHETERIZATION  1989   in [place]   normal     CARDIAC CATHETERIZATION N/A 05/23/2015   Procedure: Left Heart Cath and Cors/Grafts Angiography;  Surgeon: Lennette Bihari, MD;  Location: [facility];  Service: Cardiovascular;  Laterality: N/A;   CARDIAC CATHETERIZATION N/A 05/23/2015   Procedure: Coronary Stent Intervention;  Surgeon: Lennette Bihari, MD;  Location: [facility];  Service: Cardiovascular;  Laterality: N/A;   CARDIAC CATHETERIZATION  01-04-2004   dr Melburn Popper  @[facility]    severe 3V CAD, normal LVF   CATARACT EXTRACTION W/ INTRAOCULAR LENS  IMPLANT, BILATERAL     CORONARY ARTERY BYPASS GRAFT  01-08-2004  dr hendrickson @[facility]    LIMA to LAD,  SVG to D1,  SVG to OM,  SVG to PDA   FOOT SURGERY Right    INGUINAL HERNIA  REPAIR Bilateral 1978   INGUINAL HERNIA REPAIR Bilateral 05/12/2023   Procedure: HERNIA REPAIR INGUINAL ADULT;  Surgeon: Griselda Miner, MD;  Location: [facility] OR;  Service: General;  Laterality: Bilateral;   INSERTION OF MESH Bilateral 05/12/2023   Procedure: INSERTION OF MESH;  Surgeon: Griselda Miner, MD;  Location: [facility] OR;  Service: General;  Laterality: Bilateral;   MASS EXCISION Left 11/20/2019   Procedure: EXCISION OF SEBACEOUS CYST CHEST;  Surgeon: Sheliah Hatch De Blanch, MD;  Location: [facility];  Service: General;  Laterality: Left;   PACEMAKER IMPLANT N/A 08/31/2023   Procedure: PACEMAKER IMPLANT;  Surgeon: Marinus Maw, MD;  Location: [facility];  Service: Cardiovascular;  Laterality: N/A;    Allergies  Allergen Reactions   Aceon [Perindopril Erbumine] Shortness Of Breath and Cough   Sulfa Antibiotics Swelling   Pork-Derived Products     Current Outpatient Medications  Medication Sig Dispense Refill   acetaminophen (TYLENOL) 500 MG tablet Take 1 tablet (500 mg total) by mouth every 8 (eight) hours as needed for moderate pain. 20 tablet 0   albuterol (PROVENTIL HFA;VENTOLIN HFA) 108 (90 Base) MCG/ACT inhaler Inhale 2 puffs into the lungs every 4 (four) hours as needed for wheezing or shortness of breath. 1 Inhaler 0   apixaban (ELIQUIS) 2.5 MG TABS tablet Take 1 tablet (2.5  mg total) by mouth 2 (two) times daily. 28 tablet 0   Blood Glucose Monitoring Suppl (ONE TOUCH ULTRA 2) w/Device KIT daily in the afternoon.     docusate sodium (COLACE) 100 MG capsule Take 1 capsule (100 mg total) by mouth 2 (two) times daily (Patient taking differently: Take 100 mg by mouth at bedtime.) 100 capsule 0   ferrous sulfate 325 (65 FE) MG tablet Take 1 tablet (325 mg total) by mouth daily with breakfast.     finasteride (PROSCAR) 5 MG tablet Take 5 mg by mouth at bedtime.      [Paused] furosemide (LASIX) 20 MG tablet TAKE 1 TABLET BY MOUTH EVERY OTHER DAY 64 tablet 3    ipratropium-albuterol (DUONEB) 0.5-2.5 (3) MG/3ML SOLN Take 3 mLs by nebulization 2 (two) times daily.     ketorolac (ACULAR) 0.5 % ophthalmic solution Place 1 drop into the left eye 4 (four) times daily.     metFORMIN (GLUCOPHAGE) 500 MG tablet Take 500 mg by mouth 2 (two) times daily.     midodrine (PROAMATINE) 10 MG tablet Take 1 tablet (10 mg total) by mouth 3 (three) times daily with meals.     mirabegron ER (MYRBETRIQ) 50 MG TB24 tablet Take 50 mg by mouth at bedtime.      Multiple Vitamin (MULTIVITAMIN WITH MINERALS) TABS tablet Take 1 tablet by mouth daily.     Multiple Vitamins-Minerals (PRESERVISION AREDS 2 PO) Take 1 tablet by mouth 2 (two) times daily.      ONE TOUCH ULTRA TEST test strip 1 each by Other route as needed (glucose monoring).      pantoprazole (PROTONIX) 40 MG tablet Take 1 tablet (40 mg total) by mouth daily. (Patient taking differently: Take 40 mg by mouth daily.) 90 tablet 3   pravastatin (PRAVACHOL) 80 MG tablet Take 80 mg by mouth at bedtime.     prednisoLONE acetate (PRED FORTE) 1 % ophthalmic suspension Place 1 drop into the left eye 4 (four) times daily.     predniSONE (DELTASONE) 5 MG tablet Take 5 tablets by mouth daily.     No current facility-administered medications for this visit.    Social History   Socioeconomic History   Marital status: Married    Spouse name: Not on file   Number of children: 2 d   Years of education: Not on file   Highest education level: Not on file  Occupational History   Occupation: accountant    Comment: [employer]  Tobacco Use   Smoking status: Never   Smokeless tobacco: Never  Vaping Use   Vaping status: Never Used  Substance and Sexual Activity   Alcohol use: No    Alcohol/week: 0.0 standard drinks of alcohol   Drug use: No   Sexual activity: Not on file  Other Topics Concern   Not on file  Social History Narrative   Not on file   Social Drivers of Health   Financial Resource Strain: Not on file  Food  Insecurity: No Food Insecurity (10/03/2023)   Hunger Vital Sign    Worried About Running Out of Food in the Last Year: Never true    Ran Out of Food in the Last Year: Never true  Transportation Needs: No Transportation Needs (10/03/2023)   PRAPARE - Administrator, Civil Service (Medical): No    Lack of Transportation (Non-Medical): No  Physical Activity: Not on file  Stress: Not on file  Social Connections: Not on file  Intimate Partner  Violence: Patient Unable To Answer (10/03/2023)   Humiliation, Afraid, Rape, and Kick questionnaire    Fear of Current or Ex-Partner: Patient unable to answer    Emotionally Abused: Patient unable to answer    Physically Abused: Patient unable to answer    Sexually Abused: Patient unable to answer    Additional social history is notable in that 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  Family History  Problem Relation Age of Onset   Healthy Father    Ulcers Mother    Stroke Mother    Both parents are deceased.  ROS General: Negative; No fevers, chills, or night sweats HEENT: Uses a hearing aid; no visual changes sinus congestion, difficulty swallowing Pulmonary: Positive for remote bronchitis Cardiovascular: See HPI: No chest pain, presyncope, syncope, palpitations; dependent edema with venous insufficiency GI: Negative; No nausea, vomiting, diarrhea, or abdominal pain GU: Negative; No dysuria, hematuria, or difficulty voiding Musculoskeletal: Negative; no myalgias, joint pain, or weakness Hematologic: Negative; no easy bruising, bleeding Endocrine: Positive for diabetes mellitus Neuro: Question transient TIA April 2021 Skin: Negative; No rashes or skin lesions Psychiatric: Negative; No behavioral problems, depression Sleep: Negative; No snoring,  daytime sleepiness, hypersomnolence, bruxism, restless legs, hypnogognic hallucinations. Other comprehensive 14 point system review is  negative   Physical Exam BP (!) 89/53 Comment: DINAMAP  Pulse 71   Ht 5\' 7"  (1.702 m)   Wt 93 lb (42.2 kg) Comment: WIFE REPORTS  SpO2 100%   BMI 14.57 kg/m    Repeat blood pressure by me was 86/54  Wt Readings from Last 3 Encounters:  10/11/23 93 lb (42.2 kg)  10/03/23 88 lb 6.5 oz (40.1 kg)  09/02/23 100 lb 8.5 oz (45.6 kg)   General: Alert, oriented, no distress.  Skin: normal turgor, no rashes, warm and dry HEENT: Normocephalic, atraumatic. Pupils equal round and reactive to light; sclera anicteric; extraocular muscles intact; Nose without nasal septal hypertrophy Mouth/Parynx benign; Mallinpatti scale 2 Neck: No JVD, no carotid bruits; normal carotid upstroke Lungs: clear to ausculatation and percussion; no wheezing or rales Chest wall: without tenderness to palpitation Heart: PMI not displaced, irregularly irregular consistent with atrial fibrillation with frequent paced ventricular complexes, s1 s2 normal, 1/6 systolic murmur, no diastolic murmur, no rubs, gallops, thrills, or heaves Abdomen: soft, nontender; no hepatosplenomehaly, BS+; abdominal aorta nontender and not dilated by palpation. Back: no CVA tenderness Pulses 2+ Musculoskeletal: full range of motion, normal strength, no joint deformities Extremities: no clubbing cyanosis or edema, Homan's sign negative  Neurologic: grossly nonfocal; Cranial nerves grossly wnl Psychologic: Normal mood and affect  EKG Interpretation Date/Time:  Monday October 11 2023 10:00:02 EST Ventricular Rate:  71 PR Interval:    QRS Duration:  144 QT Interval:  432 QTC Calculation: 469 R Axis:   269  Text Interpretation: Atrial fibrillation with frequent ventricular-paced complexes Non-specific intra-ventricular conduction block Possible Lateral infarct , age undetermined Inferior infarct , age undetermined When compared with ECG of 02-Oct-2023 12:42, PREVIOUS ECG IS PRESENT Confirmed by Nicki Guadalajara ([id number]) on 10/13/2023 3:00:59  PM      August 26, 2022 ECG (independently read by me):  Atrial fibrillation at 89, inferior Q waves  January 27, 2022 ECG (independently read by me): Atrial fibrillation at 85, LBBB  June 06, 2021 ECG (independently read by me): Atrial fibrillation at 76, LBBBB  May 2022 ECG (independently read by me): Atrial fibrilation at 81, LBBB  December 2021 ECG (independently read  by me): Atrial fibrillation at 77, left bundle branch block  October 2020 ECG (independently read by me): Sinus bradycardia with first-degree AV block.  Nonspecific intraventricular block.  First-degree AV block.  February 2019 ECG (independently read by me): Sinus bradycardia 56 bpm.  First-degree AV block with a PR interval at 242 ms.  Left bundle branch block.  May 2018 ECG (independently read by me): Sinus bradycardia at 59 bpm with PACs and sinus arrhythmia.  Left axis deviation.  Left bundle branch block with repolarization changes.  First-degree AV block with a PR interval at 288 ms.  QTc interval 449 ms.  November 2017 ECG (independently read by me): Probable sinus rhythm with interventricular conduction delay.  Heart rate 58 bpm.  June 2017 ECG (independently read by me): Normal sinus rhythm at 72 bpm with first-degree AV block.  PR interval 234 ms.  LABS:     Latest Ref Rng & Units 10/08/2023    3:34 AM 10/07/2023    2:50 AM 10/06/2023    3:19 AM  BMP  Glucose 70 - 99 mg/dL 85  161  096   BUN 8 - 23 mg/dL 32  33  29   Creatinine 0.61 - 1.24 mg/dL 0.45  4.09  8.11   Sodium 135 - 145 mmol/L 134  134  133   Potassium 3.5 - 5.1 mmol/L 4.1  3.9  4.0   Chloride 98 - 111 mmol/L 104  101  97   CO2 22 - 32 mmol/L 27  27  25    Calcium 8.9 - 10.3 mg/dL 8.2  8.5  8.4         Latest Ref Rng & Units 10/03/2023    4:05 AM 10/02/2023   11:54 PM 10/02/2023    1:40 PM  Hepatic Function  Total Protein 6.5 - 8.1 g/dL 5.9  5.4  5.8   Albumin 3.5 - 5.0 g/dL 3.2  3.0  3.2   AST 15 - 41 U/L 21  23  25    ALT 0 - 44  U/L 16  15  16    Alk Phosphatase 38 - 126 U/L 47  45  49   Total Bilirubin <1.2 mg/dL 1.2  0.9  0.9   Bilirubin, Direct 0.0 - 0.2 mg/dL  0.3         Latest Ref Rng & Units 10/08/2023    3:34 AM 10/07/2023    2:50 AM 10/06/2023    3:19 AM  CBC  WBC 4.0 - 10.5 K/uL 4.9  5.1  7.6   Hemoglobin 13.0 - 17.0 g/dL 7.9  8.7  9.6   Hematocrit 39.0 - 52.0 % 24.5  27.3  29.6   Platelets 150 - 400 K/uL 147  155  164    Lab Results  Component Value Date   MCV 60.3 (L) 10/08/2023   MCV 60.7 (L) 10/07/2023   MCV 60.3 (L) 10/06/2023    Lab Results  Component Value Date   TSH 0.880 10/02/2023    BNP    Component Value Date/Time   BNP 526.4 (H) 10/04/2023 0314    ProBNP    Component Value Date/Time   PROBNP 137.0 (H) 06/10/2015 1504     Lipid Panel     Component Value Date/Time   CHOL 117 02/13/2022 1236   TRIG 55 02/13/2022 1236   HDL 53 02/13/2022 1236   CHOLHDL 2.2 02/13/2022 1236   CHOLHDL 2.2 07/20/2017 0949   VLDL 10 09/07/2016 1027   LDLCALC 52 02/13/2022  1236   LDLCALC 45 07/20/2017 0949     RADIOLOGY: CT ABDOMEN PELVIS W CONTRAST Result Date: 10/02/2023 CLINICAL DATA:  Lethargy.  History of UTI. EXAM: CT ABDOMEN AND PELVIS WITH CONTRAST TECHNIQUE: Multidetector CT imaging of the abdomen and pelvis was performed using the standard protocol following bolus administration of intravenous contrast. RADIATION DOSE REDUCTION: This exam was performed according to the departmental dose-optimization program which includes automated exposure control, adjustment of the mA and/or kV according to patient size and/or use of iterative reconstruction technique. CONTRAST:  75mL OMNIPAQUE IOHEXOL 350 MG/ML SOLN COMPARISON:  05/10/2023. FINDINGS: Lower chest: No acute abnormality. Hepatobiliary: No focal liver abnormality is seen. No gallstones, gallbladder wall thickening, or biliary dilatation. Pancreas: Unremarkable. No pancreatic ductal dilatation or surrounding inflammatory changes.  Spleen: Normal in size without focal abnormality. Adrenals/Urinary Tract: No adrenal mass. Kidneys normal in overall size, orientation and position with symmetric enhancement and excretion. Bilateral stable renal cysts measuring up to 1.8 cm. No follow-up indicated. No stones. Mild prominence of both intrarenal collecting systems and proximal ureters. No ureteral stone. Ureters not well-defined, but grossly normal in course and in caliber. Mild prominence of the bladder wall without overt thickening. No bladder mass or stone. Stomach/Bowel: Stomach is decompressed. There is low attenuation infiltrating the fat surrounding the stomach extending to the adjacent transverse colon. No convincing stomach wall thickening. Small bowel and colon are normal in caliber. No wall thickening or convincing inflammation. Vascular/Lymphatic: Aortic atherosclerosis. No aneurysm. No enlarged lymph nodes. Reproductive: Heterogeneous prostate, mildly enlarged. Other: No abdominal wall hernia or abnormality. No abdominopelvic ascites. Musculoskeletal: No fracture or acute finding. Skeletal structures are demineralized. There is a mottled type appearance of the skeleton with small ill-defined lucencies that are felt to be due to the diffuse bony demineralization. No defined bone lesion and no change. IMPRESSION: 1. There is hazy low-attenuation fat surrounds the stomach mass suggests inflammation/edema, but there is no evidence of stomach wall thickening. This still may reflect gastritis, but is nonspecific. 2. Mild prominence of the intrarenal collecting systems and proximal ureters. Bladder wall appears prominent but not overly thickened. Consider cystitis in the proper clinical setting. No CT evidence of pyelonephritis. 3. No other evidence of an acute abnormality within the abdomen or pelvis. 4. Aortic atherosclerosis. Aortic Atherosclerosis (ICD10-I70.0). Electronically Signed   By: Amie Portland M.D.   On: 10/02/2023 16:59   DG  Chest Portable 1 View Result Date: 10/02/2023 CLINICAL DATA:  Altered mental status. EXAM: PORTABLE CHEST 1 VIEW COMPARISON:  09/01/2023 FINDINGS: There is a left chest wall single lead pacer device with lead terminating in the right ventricle. Status post median sternotomy CABG procedure. Stable cardiomediastinal contours. Blunting of the left costophrenic angle identified, unchanged from previous exam. Scar like densities noted in the left base. No signs of interstitial edema, airspace consolidation or pneumothorax. Bilateral glenohumeral joint osteoarthritis with marked narrowing of the acromial humeral interval bilaterally. IMPRESSION: 1. No acute cardiopulmonary abnormalities. 2. Chronic blunting of the left costophrenic angle. 3. Bilateral glenohumeral joint osteoarthritis with marked narrowing of the acromial humeral interval bilaterally. Electronically Signed   By: Signa Kell M.D.   On: 10/02/2023 13:34   CUP PACEART INCLINIC DEVICE CHECK Result Date: 09/15/2023 Wound check appointment. Steri-strips removed without complication and patient tolerated well. Wound without redness or edema. Incision edges approximated. RN did note one area of concern on the incision. Dr. Lalla Brothers came to assess the site and requested  that the patient return in one week for  a wound recheck. Normal device function. Thresholds, sensing, and impedances consistent with implant measurements. Device programmed at 3.5V/auto capture programmed on for extra safety margin until 3 month visit. Histogram distribution appropriate for patient and level of activity. No mode switches or high ventricular rates noted. Patient educated about wound care, arm mobility, lifting restrictions. ROV in 1 week for wound recheck and also at 3 months with implanting physician for device check.Casilda Carls, BSN, RN   IMPRESSION:  1. Heart block AV complete (HCC)   2. Pacemaker   3. Hx of CABG   4. Chronic systolic CHF (congestive heart  failure) (HCC)   5. Hypotension, unspecified hypotension type   6. Left bundle branch block   7. Longstanding persistent atrial fibrillation (HCC)   8. Hyperlipidemia with target LDL less than 70   9. Aortic valve sclerosis     ASSESSMENT AND PLAN: [name] is a 89 year-old gentleman who underwent CABG surgery in 2005.  He developed  unstable angina in July 2016 was found to have a 99% stenosis in the vein graft supplying the circumflex marginal vessel just at the distal anastomosis.  This was successfully stented.  He has not had any recurrent anginal therapy.  An echo Doppler study on 11/04/2016  done when he experienced some shortness of breath after an episode of bronchitis revealed mild LVH with EF of 50-55% and grade 2 diastolic dysfunction.  There was aortic valve sclerosis without stenosis, with biatrial enlargement and mild pulmonary hypertension with a PA pressure peaked 36 mm.   He has been in persistent atrial fibrillation and has continued to be on anticoagulation with Eliquis low-dose 2.5 mg twice a day.  There was some concern that he may have suffered a small TIA in April 2021 when he developed transient right arm weakness and transient inability to right with ultimately resolved.  He has had issues with acute on chronic diastolic heart failure and required increasing diuresis for lower extremity dependent edema.  His echo Doppler study from June 2022 demonstrated  reduced EF at 35 to 40% with global hypokinesis.  His right ventricle was mildly enlarged.  There was moderate right atrial and severe left atrial dilatation.  He had mild MR and mild to moderate aortic sclerosis without stenosis.  He is not having any anginal symptoms.  At his evaluation in August 2022, low-dose ARB therapy was reinstituted with his reduced LV function.  He also had venous insufficiency and support stockings were recommended.  My last evaluation, he has had several hospitalizations.  He ultimately developed  complete heart block and required permanent pacemaker insertion by Dr. Ladona Ridgel.  He also has had issues with low blood pressure and is now on midodrine.  Following his recent hospitalizations currently he is at [facility] rehabilitation.  I have recommended support stockings with his low blood pressure with at least 20 to 30 mm of pressure support.  He is anticoagulated on reduced dose Eliquis.  His ECG today shows underlying atrial fibrillation with occasional ventricular paced beats.  I have recommended he see Edd Fabian, NP in follow-up in approximately 4 4 to 6 months.  He had seen Dr. Carolan Clines while hospitalized.  I discussed with him and his family today my plans for retirement in June 2025.  Dr. Carolan Clines had rounded on him in the hospital.  Per their request, I will transition him to the care of Dr. Wyline Mood following my retirement.    10/13/2023 3:08 PM

## 2023-10-13 ENCOUNTER — Encounter: Payer: Self-pay | Admitting: Cardiovascular Disease

## 2023-10-13 DIAGNOSIS — I959 Hypotension, unspecified: Secondary | ICD-10-CM | POA: Diagnosis not present

## 2023-10-13 DIAGNOSIS — R339 Retention of urine, unspecified: Secondary | ICD-10-CM | POA: Diagnosis not present

## 2023-10-13 DIAGNOSIS — N401 Enlarged prostate with lower urinary tract symptoms: Secondary | ICD-10-CM | POA: Diagnosis not present

## 2023-10-13 DIAGNOSIS — M6281 Muscle weakness (generalized): Secondary | ICD-10-CM | POA: Diagnosis not present

## 2023-10-13 DIAGNOSIS — I5022 Chronic systolic (congestive) heart failure: Secondary | ICD-10-CM | POA: Diagnosis not present

## 2023-10-13 DIAGNOSIS — R338 Other retention of urine: Secondary | ICD-10-CM | POA: Diagnosis not present

## 2023-10-13 DIAGNOSIS — R2689 Other abnormalities of gait and mobility: Secondary | ICD-10-CM | POA: Diagnosis not present

## 2023-10-13 DIAGNOSIS — E43 Unspecified severe protein-calorie malnutrition: Secondary | ICD-10-CM | POA: Diagnosis not present

## 2023-10-13 DIAGNOSIS — I480 Paroxysmal atrial fibrillation: Secondary | ICD-10-CM | POA: Diagnosis not present

## 2023-10-13 DIAGNOSIS — E440 Moderate protein-calorie malnutrition: Secondary | ICD-10-CM | POA: Diagnosis not present

## 2023-10-13 DIAGNOSIS — E872 Acidosis, unspecified: Secondary | ICD-10-CM | POA: Diagnosis not present

## 2023-10-13 DIAGNOSIS — J449 Chronic obstructive pulmonary disease, unspecified: Secondary | ICD-10-CM | POA: Diagnosis not present

## 2023-10-15 DIAGNOSIS — R338 Other retention of urine: Secondary | ICD-10-CM | POA: Diagnosis not present

## 2023-10-15 DIAGNOSIS — N401 Enlarged prostate with lower urinary tract symptoms: Secondary | ICD-10-CM | POA: Diagnosis not present

## 2023-10-15 DIAGNOSIS — E440 Moderate protein-calorie malnutrition: Secondary | ICD-10-CM | POA: Diagnosis not present

## 2023-10-18 DIAGNOSIS — I447 Left bundle-branch block, unspecified: Secondary | ICD-10-CM | POA: Diagnosis not present

## 2023-10-18 DIAGNOSIS — N401 Enlarged prostate with lower urinary tract symptoms: Secondary | ICD-10-CM | POA: Diagnosis not present

## 2023-10-18 DIAGNOSIS — E440 Moderate protein-calorie malnutrition: Secondary | ICD-10-CM | POA: Diagnosis not present

## 2023-10-18 DIAGNOSIS — Z950 Presence of cardiac pacemaker: Secondary | ICD-10-CM | POA: Diagnosis not present

## 2023-10-25 DIAGNOSIS — R0989 Other specified symptoms and signs involving the circulatory and respiratory systems: Secondary | ICD-10-CM | POA: Diagnosis not present

## 2023-10-28 ENCOUNTER — Ambulatory Visit (INDEPENDENT_AMBULATORY_CARE_PROVIDER_SITE_OTHER): Payer: Medicare Other

## 2023-10-28 DIAGNOSIS — I442 Atrioventricular block, complete: Secondary | ICD-10-CM

## 2023-10-28 LAB — CUP PACEART REMOTE DEVICE CHECK
Battery Remaining Longevity: 172 mo
Battery Voltage: 3.17 V
Brady Statistic RV Percent Paced: 6.13 %
Date Time Interrogation Session: 20250101190745
Implantable Lead Connection Status: 753985
Implantable Lead Implant Date: 20241105
Implantable Lead Location: 753860
Implantable Lead Model: 3830
Implantable Pulse Generator Implant Date: 20241105
Lead Channel Impedance Value: 399 Ohm
Lead Channel Impedance Value: 703 Ohm
Lead Channel Pacing Threshold Amplitude: 0.875 V
Lead Channel Pacing Threshold Pulse Width: 0.4 ms
Lead Channel Sensing Intrinsic Amplitude: 10.25 mV
Lead Channel Sensing Intrinsic Amplitude: 10.25 mV
Lead Channel Setting Pacing Amplitude: 2 V
Lead Channel Setting Pacing Pulse Width: 0.4 ms
Lead Channel Setting Sensing Sensitivity: 1.2 mV
Zone Setting Status: 755011

## 2023-11-08 NOTE — Progress Notes (Signed)
 Cardiology Clinic Note   Patient Name: Dakota [name] Date of Encounter: 11/09/2023  Primary Care Provider:  Tisovec, Richard W, MD Primary Cardiologist:  Debby Sor, MD  Patient Profile    Dakota [name] 89 year old male presents the clinic today for follow-up evaluation of his combined systolic and diastolic heart failure.   Past Medical History    Past Medical History:  Diagnosis Date   Aortic atherosclerosis (HCC)    Arthritis    Atrial fibrillation, chronic (HCC)    Benign localized prostatic hyperplasia with lower urinary tract symptoms (LUTS)    CAD (coronary artery disease)    Chronic chest pain    takes imdur    Chronic combined systolic (congestive) and diastolic (congestive) heart failure (HCC)    followed by cardiology   Chronic dyspnea    Cognitive impairment    COPD, moderate (HCC)    Edema of both lower extremities    takes lasix  and uses teds   First degree heart block    GERD (gastroesophageal reflux disease)    Heart murmur    History of cardiovascular stress test    Lexiscan  Myoview  7/16:  EF 51%, inferior, inferolateral ischemia; Intermediate Risk   History of smallpox    HOH (hard of hearing)    even with hearing aids   Hypertension    Ischemic heart disease 2005   Dr Sor a. s/p CABG;  b.  LHC 05/23/15:  S-RPAVB ok, S-D2 ok, S-OM2 99% (s/p Synergy DES), L-LAD ok, EF normal (complicated by pseudoaneurysm)    LBBB (left bundle branch block)    Right arm weakness    S/P CABG x 4    01-08-2004   S/P drug eluting coronary stent placement    05-21-2015  PCI and DES x1 to SVG--OM graft   Thalassemia minor    Type 2 diabetes, diet controlled (HCC)    Wears glasses    Wears hearing aid in both ears    Past Surgical History:  Procedure Laterality Date   APPENDECTOMY     CARDIAC CATHETERIZATION  1989   in [place]   normal     CARDIAC CATHETERIZATION N/A 05/23/2015   Procedure: Left Heart Cath and Cors/Grafts Angiography;  Surgeon: Debby DELENA Sor, MD;  Location: [facility] INVASIVE CV LAB;  Service: Cardiovascular;  Laterality: N/A;   CARDIAC CATHETERIZATION N/A 05/23/2015   Procedure: Coronary Stent Intervention;  Surgeon: Debby DELENA Sor, MD;  Location: [facility] INVASIVE CV LAB;  Service: Cardiovascular;  Laterality: N/A;   CARDIAC CATHETERIZATION  01-04-2004   dr calhoun  @[facility]    severe 3V CAD, normal LVF   CATARACT EXTRACTION W/ INTRAOCULAR LENS  IMPLANT, BILATERAL     CORONARY ARTERY BYPASS GRAFT  01-08-2004  dr hendrickson @[facility]    LIMA to LAD,  SVG to D1,  SVG to OM,  SVG to PDA   FOOT SURGERY Right    INGUINAL HERNIA REPAIR Bilateral 1978   INGUINAL HERNIA REPAIR Bilateral 05/12/2023   Procedure: HERNIA REPAIR INGUINAL ADULT;  Surgeon: Curvin Deward MOULD, MD;  Location: [facility] OR;  Service: General;  Laterality: Bilateral;   INSERTION OF MESH Bilateral 05/12/2023   Procedure: INSERTION OF MESH;  Surgeon: Curvin Deward MOULD, MD;  Location: [facility] OR;  Service: General;  Laterality: Bilateral;   MASS EXCISION Left 11/20/2019   Procedure: EXCISION OF SEBACEOUS CYST CHEST;  Surgeon: Stevie Herlene Righter, MD;  Location: [facility];  Service: General;  Laterality: Left;   PACEMAKER IMPLANT  N/A 08/31/2023   Procedure: PACEMAKER IMPLANT;  Surgeon: Waddell Danelle ORN, MD;  Location: [facility] INVASIVE CV LAB;  Service: Cardiovascular;  Laterality: N/A;    Allergies  Allergies  Allergen Reactions   Aceon [Perindopril Erbumine] Shortness Of Breath and Cough   Sulfa Antibiotics Swelling   Pork-Derived Products     History of Present Illness    Mr. Tignor has a PMH ofleft bundle branch block, combined systolic and diastolic heart failure, CAD, essential hypertension, moderate COPD, diabetes, and nonspecific chest pain.   He was  seen by Dr. Burnard on 07/17/2019.  During that time he felt well and was considering return to work as well or greater in January 2021.  However, due to the COVID-19 pandemic it was recommended that he not return to work at this time.  An  echocardiogram on 07/19/2019 to evaluate systolic function showed an LVEF of 50 to 55%, a mildly dilated left atrium, moderately calcified mitral valve, severe aortic valve annular calcification, mitral valve regurgitation, mild tricuspid valve regurgitation, and mildly elevated pulmonary artery systolic pressure.     He underwent CABG in 2005 with a LIMA to LAD, SVG to diagonal, SVG to obtuse marginal and SVG to distal RCA.  He has a history of combined systolic and diastolic heart failure, left bundle branch block and diabetes mellitus.  In July 2016 he developed symptoms worrisome for unstable angina.  A nuclear stress test was abnormal and demonstrated inferior to inferolateral ischemia.  He underwent cardiac catheterization on 05/21/2015.  His catheterization showed low normal global LV function with an EF of 50 to 55%.  The catheterization also demonstrated coronary artery disease with 95% native distal left main stenosis with proximal occlusion of the LAD after the first 2 septal perforating arteries total occlusion of the circumflex at the ostium and RCA stenosis with 50% proximal, 50% mid and total occlusion of the distal RCA.  His LIMA graft was patent to his mid LAD.  This vein graft supplying the circumflex marginal and 99% distal stenosis.  He also had a patent vein graft supplying the diagonal vessel and a patent vein graft to the distal RCA.  A DES x1 to the obtuse marginal vein graft was placed at that time.   He presented the clinic 08/07/2019 and stated he felt well.  He continued to be active outside walking daily.  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  However, his daughter did not think this was a good idea.  Given his comorbidities and cardiac history with increased risk of COVID-19 he was advised not to return to work.   I will gave him a work note.  He had no other concerns or complaints at the time.   He was seen by Dr. Jordan 11/21/2020.  During that time he  reported increased dyspnea and lower extremity swelling for several weeks.  He had a cough that produced yellow phlegm.  He had taken extra furosemide , was urinating more however, still had lower extremity swelling.  He was seen by his primary care who ordered a chest x-ray which showed bilateral pleural effusion.  He was started on Levaquin  for possible PNA.  Lab work was also drawn at the time but was still pending.  His furosemide  was increased to 40 mg twice daily.  He was instructed that if he became worse he may be a candidate for thoracentesis.   He presented to the clinic 11/29/2020 for follow-up evaluation stated he  was feeling much better.  He questioned whether he needed to continue to take 40 mg of Lasix  twice daily.  He reported that he noticed that his ankles still had some swelling.  He felt that the fluid was coming off fairly slowly.  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   I ordered a BMP and plan follow-up in 6 months.  He was seen in follow-up by Dr. Sheena on 11/25/2022.  His daughter had contacted the office the prior day and reported that he had lower extremity swelling.  He was noted to have some left ankle edema.  He denied shortness of breath, chest pain lightheadedness, dizziness, and leg pain.  His Lasix  continued every third day but he was asked to take an extra dose.  Continued use of the lower extremity support stockings was recommended.  His BMP was stable and his magnesium  was WDL.    He presented to the clinic 03/05/23 for follow-up evaluation and stated he was retired.  He presented with his daughter.  He reported that he walked most days of the week.  He had been taking his furosemide  every third day.  His blood pressure was low today at 86/52.  He denied chest pain shortness of breath.  He was euvolemic.  I stopped his amlodipine , decreased his furosemide  to every fourth day, had him slightly increase his p.o. hydration, ordered a BMP and planned follow-up when they  returned to town after 06/24/2023.  He was admitted to the hospital on 05/10/2023 and discharged on 05/17/2023.  Cardiology was consulted for surgical reduction of right inguinal hernia.  He underwent TTE 05/11/2023 which showed an EF of 40-45%, global hypokinesis, mild concentric LVH, and dyssynchrony due to LBBB.  He was noted to have severe calcification of his aortic valve with severe thickening of the aortic valve.  He was noted to have aortic stenosis with a mean gradient of 5 mmHg.  Mild mitral valve regurgitation was also noted.  He underwent inguinal hernia repair 7/24.  He presented to the clinic 07/05/23 for follow-up evaluation and stated he continued to walk regularly.  He presented with his daughter.  She reported that he was eating well.  She noted that he had been sleeping more.  His weight had remained stable.  His blood pressure was 126/66.  He reported that he was generally doing well.  We reviewed his recent surgery.  He reported that he needed samples of apixaban  2.5 mg twice daily.   He had follow-up appointment planned with Dr. Burnard in 1 month.  I continued current medication regimen, daily weights, and low-sodium diet.  He was admitted 08/29/2023 until 09/02/2023.  He was admitted with worsening fatigue, anorexia/poor intake, and failure to thrive.  He was noted to have severe symptomatic bradycardia in the setting of complete heart block.  He received dopamine  infusion and underwent PPM implant on 08/31/2023.  He was successfully weaned off of dopamine  post implant.  Today he denies chest pain, increased shortness of breath, lower extremity edema, fatigue, palpitations, melena, hematuria, hemoptysis, diaphoresis, weakness, presyncope, syncope, orthopnea, and PND.  Cardiology was again consulted on 10/03/2023 and discharged on 10/08/2023.  Cardiology evaluated CHF and aortic stenosis at the request of internal medicine.  He had return to the office for wound check 09/22/2023.  There was  concern for hematoma at pacemaker site.  Dr. Waddell had recommended hold of Eliquis  for 7 days and started 7 days of Keflex .  He again returns to the  office on 09/29/2023.  His hematoma had improved.  He was evaluated by Dr. Fernande and his Eliquis  was resumed.  Most recent admission was for acute diarrhea, fatigue, and dehydration.  He received IV fluids and electrolyte replacement.  His lactic acidosis resolved.  PT/OT evaluated and recommended skilled nursing facility.  He was seen in follow-up by Dr. Burnard on 10/11/2023.  During that time he was recovering at [facility] rehab.  He had follow-up scheduled with Dr. Waddell February 2025.  He continued to be on low-dose Eliquis , midodrine , pravastatin , Protonix , and prednisone .  His EKG showed atrial fibrillation with frequent ventricular paced complexes 71 bpm.  His medications were continued.  Follow-up was planned for 4 to 6 months.  He was planning to transition care to Dr. Ronal Ross.  He presents to the clinic today for evaluation of his elevated heart rate.  He states he has no chest discomfort.  He is lethargic today.  His daughter helps with history.  We reviewed his hospitalization and pacemaker implant.  We reviewed his episode of elevated heart rate and his dehydration.  She expressed understanding.  He continues to reside at skilled nursing.  His blood pressure today is 106/64.  His pulse is 94.  I encouraged him to increase his p.o. hydration, continue physical therapy as tolerated, and we will plan to maintain his current medication regimen.  I will plan follow-up in 4 to 6 months.  Today he denies shortness of breath, chest discomfort, and dizziness.    Home Medications    Prior to Admission medications   Medication Sig Start Date End Date Taking? Authorizing Provider  albuterol  (PROVENTIL  HFA;VENTOLIN  HFA) 108 (90 Base) MCG/ACT inhaler Inhale 2 puffs into the lungs every 4 (four) hours as needed for wheezing or shortness of breath.  01/03/16   Diedra Rocky PARAS, MD  amLODipine  (NORVASC ) 2.5 MG tablet Take 1 tablet by mouth once daily 06/08/22   Burnard Debby LABOR, MD  apixaban  (ELIQUIS ) 2.5 MG TABS tablet Take 1 tablet (2.5 mg total) by mouth 2 (two) times daily. 11/04/22   Burnard Debby LABOR, MD  Blood Glucose Monitoring Suppl (ONE TOUCH ULTRA 2) w/Device KIT daily in the afternoon. 11/02/22   [provider]  chlorhexidine  (PERIDEX ) 0.12 % solution 15 mLs 2 (two) times daily. 10/28/22   [provider]  docusate sodium  (COLACE) 100 MG capsule Take 100 mg by mouth at bedtime.    [provider]  finasteride  (PROSCAR ) 5 MG tablet Take 5 mg by mouth at bedtime.     [provider]  fluticasone (FLONASE) 50 MCG/ACT nasal spray Place into both nostrils. 01/07/22   [provider]  furosemide  (LASIX ) 20 MG tablet TAKE 1 TABLET BY MOUTH EVERY OTHER DAY 01/25/23   Burnard Debby LABOR, MD  isosorbide  mononitrate (IMDUR ) 30 MG 24 hr tablet Take 1 tablet by mouth once daily 06/08/22   Burnard Debby LABOR, MD  losartan  (COZAAR ) 25 MG tablet Take 1 tablet by mouth once daily 06/08/22   Burnard Debby LABOR, MD  mirabegron  ER (MYRBETRIQ ) 50 MG TB24 tablet Take 50 mg by mouth at bedtime.     [provider]  Multiple Vitamin (MULTIVITAMIN WITH MINERALS) TABS tablet Take 1 tablet by mouth daily.    [provider]  Multiple Vitamins-Minerals (PRESERVISION AREDS 2 PO) Take 1 tablet by mouth 2 (two) times daily.     [provider]  NITROSTAT  0.4 MG SL tablet DISSOLVE ONE TABLET UNDER THE TONGUE EVERY 5  MINUTES AS NEEDED FOR CHEST PAIN. Patient not taking: Reported on 11/25/2022 01/13/22   Burnard Debby LABOR, MD  ONE TOUCH ULTRA TEST test strip 1 each by Other route as needed (glucose monoring).  04/21/15   [provider]  pantoprazole  (PROTONIX ) 40 MG tablet Take 1 tablet (40 mg total) by mouth daily. Patient taking differently: Take 40 mg by mouth daily. 05/21/15   Lelon Hamilton T, PA-C  PFIZER  COVID-19 VAC BIVALENT injection  10/22/21   [provider]  pravastatin  (PRAVACHOL ) 80 MG tablet Take 80 mg by mouth at bedtime.    [provider]  predniSONE  (DELTASONE ) 10 MG tablet Take 10 mg by mouth. 09/21/22   [provider]    Family History    Family History  Problem Relation Age of Onset   Healthy Father    Ulcers Mother    Stroke Mother    He indicated that his mother is deceased. He indicated that his father is deceased. He indicated that his sister is deceased. He indicated that his maternal grandmother is deceased. He indicated that his maternal grandfather is deceased. He indicated that his paternal grandmother is deceased. He indicated that his paternal grandfather is deceased.  Social History    Social History   Socioeconomic History   Marital status: Married    Spouse name: Not on file   Number of children: 2 d   Years of education: Not on file   Highest education level: Not on file  Occupational History   Occupation: accountant    Comment: [employer]  Tobacco Use   Smoking status: Never   Smokeless tobacco: Never  Vaping Use   Vaping status: Never Used  Substance and Sexual Activity   Alcohol  use: No    Alcohol /week: 0.0 standard drinks of alcohol    Drug use: No   Sexual activity: Not on file  Other Topics Concern   Not on file  Social History Narrative   Not on file   Social Drivers of Health   Financial Resource Strain: Not on file  Food Insecurity: No Food Insecurity (10/03/2023)   Hunger Vital Sign    Worried About Running Out of Food in the Last Year: Never true    Ran Out of Food in the Last Year: Never true  Transportation Needs: No Transportation Needs (10/03/2023)   PRAPARE - Administrator, Civil Service (Medical): No    Lack of Transportation (Non-Medical): No  Physical Activity: Not on file  Stress: Not on file  Social Connections: Not on file  Intimate Partner Violence: Patient Unable To  Answer (10/03/2023)   Humiliation, Afraid, Rape, and Kick questionnaire    Fear of Current or Ex-Partner: Patient unable to answer    Emotionally Abused: Patient unable to answer    Physically Abused: Patient unable to answer    Sexually Abused: Patient unable to answer     Review of Systems    General:  No chills, fever, night sweats or weight changes.  Cardiovascular:  No chest pain, dyspnea on exertion, edema, orthopnea, palpitations, paroxysmal nocturnal dyspnea. Dermatological: No rash, lesions/masses Respiratory: No cough, dyspnea Urologic: No hematuria, dysuria Abdominal:   No nausea, vomiting, diarrhea, bright red blood per rectum, melena, or hematemesis Neurologic:  No visual changes, wkns, changes in mental status. All other systems reviewed and are otherwise negative except as noted above.  Physical Exam    VS:  BP 106/64 (BP Location: Right Arm, Patient Position: Sitting, Cuff Size: Small)  Pulse 94   Ht 5' 7 (1.702 m)   Wt 88 lb (39.9 kg) Comment: tHE RECENT WEIGHT MEASURED, NOT TODAY.  SpO2 98%   BMI 13.78 kg/m  , BMI Body mass index is 13.78 kg/m. GEN: Well nourished, well developed, in no acute distress. HEENT: normal. Neck: Supple, no JVD, carotid bruits, or masses. Cardiac: Regular, no murmurs, rubs, or gallops. No clubbing, cyanosis, edema.  Radials/DP/PT 2+ and equal bilaterally.  Respiratory:  Respirations regular and unlabored, clear to auscultation bilaterally. GI: Soft, nontender, nondistended, BS + x 4. MS: no deformity or atrophy. Skin: warm and dry, no rash. Neuro:  Strength and sensation are intact.  Lethargic answers questions appropriately when aroused Psych: Normal affect.  Accessory Clinical Findings    Recent Labs: 10/02/2023: TSH 0.880 10/03/2023: ALT 16 10/04/2023: B Natriuretic Peptide 526.4 10/08/2023: BUN 32; Creatinine, Ser 0.55; Hemoglobin 7.9; Magnesium  1.9; Platelets 147; Potassium 4.1; Sodium 134   Recent Lipid Panel     Component Value Date/Time   CHOL 117 02/13/2022 1236   TRIG 55 02/13/2022 1236   HDL 53 02/13/2022 1236   CHOLHDL 2.2 02/13/2022 1236   CHOLHDL 2.2 07/20/2017 0949   VLDL 10 09/07/2016 1027   LDLCALC 52 02/13/2022 1236   LDLCALC 45 07/20/2017 0949         ECG personally reviewed by me today-none today.  Echocardiogram 04/14/2021  IMPRESSIONS     1. Left ventricular ejection fraction, by estimation, is 35 to 40%. The  left ventricle has moderately decreased function. The left ventricle  demonstrates global hypokinesis. Left ventricular diastolic function could  not be evaluated.   2. Right ventricular systolic function is moderately reduced. The right  ventricular size is mildly enlarged. There is mildly elevated pulmonary  artery systolic pressure.   3. Left atrial size was severely dilated.   4. Right atrial size was moderately dilated.   5. The mitral valve is abnormal. Mild mitral valve regurgitation. Severe  mitral annular calcification.   6. Tricuspid valve regurgitation is moderate.   7. The aortic valve is calcified. There is severe calcifcation of the  aortic valve. Aortic valve regurgitation is not visualized. Mild to  moderate aortic valve sclerosis/calcification is present, without any  evidence of aortic stenosis.   8. The inferior vena cava is normal in size with <50% respiratory  variability, suggesting right atrial pressure of 8 mmHg.   Comparison(s): Prior images reviewed side by side. Changes from prior  study are noted. EF reduced compared to prior study.   Conclusion(s)/Recommendation(s): EF reduced on current study compared to  prior, with significant dyssynchrony. Both mitral and aortic valves are  heavily calcified with restricted mobility. However, neither has  significantly elevated gradients.   FINDINGS   Left Ventricle: Left ventricular ejection fraction, by estimation, is 35  to 40%. The left ventricle has moderately decreased function.  The left  ventricle demonstrates global hypokinesis. The left ventricular internal  cavity size was normal in size.  There is no left ventricular hypertrophy. Abnormal (paradoxical) septal  motion, consistent with left bundle branch block. Left ventricular  diastolic function could not be evaluated due to atrial fibrillation. Left  ventricular diastolic function could not   be evaluated.   Right Ventricle: The right ventricular size is mildly enlarged. Right  vetricular wall thickness was not well visualized. Right ventricular  systolic function is moderately reduced. There is mildly elevated  pulmonary artery systolic pressure. The tricuspid   regurgitant velocity is 3.00 m/s, and with  an assumed right atrial  pressure of 8 mmHg, the estimated right ventricular systolic pressure is  44.0 mmHg.   Left Atrium: Left atrial size was severely dilated.   Right Atrium: Right atrial size was moderately dilated.   Pericardium: There is no evidence of pericardial effusion.   Mitral Valve: The mitral valve is abnormal. There is moderate thickening  of the mitral valve leaflet(s). There is moderate calcification of the  mitral valve leaflet(s). Severe mitral annular calcification. Mild mitral  valve regurgitation. MV peak  gradient, 4.2 mmHg. The mean mitral valve gradient is 1.5 mmHg with  average heart rate of 78 bpm.   Tricuspid Valve: The tricuspid valve is normal in structure. Tricuspid  valve regurgitation is moderate . No evidence of tricuspid stenosis.   Aortic Valve: The aortic valve is calcified. There is severe calcifcation  of the aortic valve. There is severe aortic valve annular calcification.  Aortic valve regurgitation is not visualized. Mild to moderate aortic  valve sclerosis/calcification is  present, without any evidence of aortic stenosis. Aortic valve mean  gradient measures 3.5 mmHg. Aortic valve peak gradient measures 7.3 mmHg.  Aortic valve area, by VTI measures  1.36 cm.   Pulmonic Valve: The pulmonic valve was not well visualized. Pulmonic valve  regurgitation is mild. No evidence of pulmonic stenosis.   Aorta: The aortic root, ascending aorta and aortic arch are all  structurally normal, with no evidence of dilitation or obstruction.   Venous: The inferior vena cava is normal in size with less than 50%  respiratory variability, suggesting right atrial pressure of 8 mmHg.   IAS/Shunts: The atrial septum is grossly normal.    Assessment & Plan   1. Atrial fibrillation, rapid ventricular rate-heart rate today 94 BPM.  Compliant with apixaban .  No bleeding issues noted.  Recently admitted with acute diarrhea.  Required IV hydration. Continue Eliquis  Avoid triggers caffeine, chocolate, EtOH, dehydration etc-reviewed Increase p.o. hydration   Acute on chronic combined diastolic and systolic CHF-weight today 88 lbs.   Physical therapy at [facility].  Today he denies dizziness.  Euvolemic. Continue losartan , Imdur  Continue low-sodium diet-reviewed Maintain physical activity.  Essential hypertension-blood pressure today 106/64.   Maintain blood pressure log Continue Imdur , losartan  Maintain blood pressure log    Coronary artery disease-no chest pain.  Denies  chest discomfort.  Status post PCI and stent placement 2016 Continue current therapy Heart healthy high-fiber low-sodium diet Increase physical activity as tolerated   PPM-pacemaker site healing well.  PPM implanted on 08/31/2023. Following with EP  Disposition: Follow-up with Dr. Burnard or me in 4 to 6 months  Josefa HERO. Tangi Shroff NP-C     11/09/2023, 2:31 PM [facility] [address] Office [phone] Fax [phone]    I spent 13 minutes examining this patient, reviewing medications, and using patient centered shared decision making involving her cardiac care.  Prior to her visit I spent greater than 20 minutes reviewing her past  medical history,  medications, and prior cardiac tests.

## 2023-11-09 ENCOUNTER — Ambulatory Visit: Payer: Medicare Other | Attending: General Practice | Admitting: General Practice

## 2023-11-09 ENCOUNTER — Encounter: Payer: Self-pay | Admitting: General Practice

## 2023-11-09 VITALS — BP 106/64 | HR 94 | Ht 67.0 in | Wt 88.0 lb

## 2023-11-09 DIAGNOSIS — Z950 Presence of cardiac pacemaker: Secondary | ICD-10-CM

## 2023-11-09 DIAGNOSIS — I5022 Chronic systolic (congestive) heart failure: Secondary | ICD-10-CM

## 2023-11-09 DIAGNOSIS — Z951 Presence of aortocoronary bypass graft: Secondary | ICD-10-CM

## 2023-11-09 DIAGNOSIS — I10 Essential (primary) hypertension: Secondary | ICD-10-CM

## 2023-11-09 DIAGNOSIS — I4891 Unspecified atrial fibrillation: Secondary | ICD-10-CM | POA: Diagnosis not present

## 2023-11-09 DIAGNOSIS — I2510 Atherosclerotic heart disease of native coronary artery without angina pectoris: Secondary | ICD-10-CM

## 2023-11-09 NOTE — Patient Instructions (Signed)
 Medication Instructions:  The current medical regimen is effective;  continue present plan and medications as directed. Please refer to the Current Medication list given to you today.  *If you need a refill on your cardiac medications before your next appointment, please call your pharmacy*  Lab Work: NONE If you have labs (blood work) drawn today and your tests are completely normal, you will receive your results only by:  MyChart Message (if you have MyChart) OR A paper copy in the mail If you have any lab test that is abnormal or we need to change your treatment, we will call you to review the results.  Other Instructions INCREASE HYDRATION   Follow-Up: At [facility], you and your health needs are our priority.  As part of our continuing mission to provide you with exceptional heart care, we have created designated Provider Care Teams.  These Care Teams include your primary Cardiologist (physician) and Advanced Practice Providers (APPs -  Physician Assistants and Nurse Practitioners) who all work together to provide you with the care you need, when you need it.  Your next appointment:   4-6 month(s)  Provider:   Josefa Beauvais, FNP-C

## 2023-12-09 NOTE — Progress Notes (Signed)
Remote pacemaker transmission.

## 2023-12-13 ENCOUNTER — Telehealth: Payer: Self-pay | Admitting: Internal Medicine

## 2023-12-13 NOTE — Telephone Encounter (Signed)
I called the pt daughter to give her the number to [facility] tech support. They will be the ones to send her a pacemaker ID card.

## 2023-12-13 NOTE — Telephone Encounter (Signed)
Pt's daughter would like a c/b regarding Pacemaker Card. She states that pt have yet to receive. Please advise

## 2023-12-20 ENCOUNTER — Ambulatory Visit: Payer: Medicare Other | Admitting: Internal Medicine

## 2023-12-22 ENCOUNTER — Telehealth: Payer: Self-pay | Admitting: Cardiovascular Disease

## 2023-12-22 NOTE — Telephone Encounter (Signed)
 Patient's daughter is calling to inform Dr. Tresa Endo that the patient is now under hospice care. Patient's daughter requested to speak with a nurse in regard to the patient's HIPAA privacy. Please advise.

## 2023-12-22 NOTE — Telephone Encounter (Signed)
 Patient identification verified by 2 forms. Marilynn Rail, RN    Called and spoke to patient daughter Adora Fridge states:   -Patient is now on hospice   -he has his own hospice doctor/nurse, currently at home  -wants to know who gave health information to about patient to her sister   -she should be the only one receiving information about patients care  Nadeema has no further questions at this time

## 2023-12-31 NOTE — Telephone Encounter (Signed)
 Spoke with patient's daughter, Oneita Kras ([place]). She is unsure how her siblings are getting patient's medical information, but requests that it be reflected on the pt's chart that we are not to disclose information to them. Explained that I do not see any recent documentation where pt's son or other daughter were contacted. Confirmed all phone numbers in pt's contacts belong to [facility]. Deactivated old DPR which has since been updated. Nadeema verbalizes appreciation of assistance. She denies additional concerns at this time.

## 2024-01-27 ENCOUNTER — Ambulatory Visit (INDEPENDENT_AMBULATORY_CARE_PROVIDER_SITE_OTHER): Payer: Medicare HMO

## 2024-01-27 DIAGNOSIS — I442 Atrioventricular block, complete: Secondary | ICD-10-CM | POA: Diagnosis not present

## 2024-01-27 LAB — CUP PACEART REMOTE DEVICE CHECK
Battery Remaining Longevity: 166 mo
Battery Voltage: 3.13 V
Brady Statistic RV Percent Paced: 99.39 %
Date Time Interrogation Session: 20250403065121
Implantable Lead Connection Status: 753985
Implantable Lead Implant Date: 20241105
Implantable Lead Location: 753860
Implantable Lead Model: 3830
Implantable Pulse Generator Implant Date: 20241105
Lead Channel Impedance Value: 380 Ohm
Lead Channel Impedance Value: 646 Ohm
Lead Channel Pacing Threshold Amplitude: 0.875 V
Lead Channel Pacing Threshold Pulse Width: 0.4 ms
Lead Channel Sensing Intrinsic Amplitude: 31.625 mV
Lead Channel Sensing Intrinsic Amplitude: 31.625 mV
Lead Channel Setting Pacing Amplitude: 2 V
Lead Channel Setting Pacing Pulse Width: 0.4 ms
Lead Channel Setting Sensing Sensitivity: 1.2 mV
Zone Setting Status: 755011

## 2024-03-06 NOTE — Progress Notes (Signed)
 Remote pacemaker transmission.

## 2024-03-08 ENCOUNTER — Ambulatory Visit: Payer: Medicare Other | Admitting: General Practice

## 2024-03-16 ENCOUNTER — Encounter: Payer: Self-pay | Admitting: General Practice

## 2024-03-31 ENCOUNTER — Telehealth: Payer: Self-pay | Admitting: Cardiovascular Disease

## 2024-03-31 NOTE — Telephone Encounter (Signed)
 Patient status updated in [facility] and discontinued from [facility] service.

## 2024-03-31 NOTE — Telephone Encounter (Signed)
 Daughter Roxanna Coppersmith) called to report patient passed away on 2024-04-04.

## 2024-03-31 NOTE — Telephone Encounter (Signed)
 Future device remote checks have been cancelled.. will send to device and [facility] for further management and documentation.

## 2024-09-27 ENCOUNTER — Encounter (HOSPITAL_COMMUNITY): Payer: Self-pay | Admitting: General Surgery
